# Patient Record
Sex: Female | Born: 1945
Health system: Southern US, Community
[De-identification: ages and names within clinical notes are randomized; demographics above are authoritative.]

## PROBLEM LIST (undated history)

## (undated) DIAGNOSIS — M545 Low back pain, unspecified: Secondary | ICD-10-CM

## (undated) DIAGNOSIS — J189 Pneumonia, unspecified organism: Secondary | ICD-10-CM

## (undated) DIAGNOSIS — M1990 Unspecified osteoarthritis, unspecified site: Secondary | ICD-10-CM

## (undated) DIAGNOSIS — I341 Nonrheumatic mitral (valve) prolapse: Secondary | ICD-10-CM

## (undated) DIAGNOSIS — K5790 Diverticulosis of intestine, part unspecified, without perforation or abscess without bleeding: Secondary | ICD-10-CM

## (undated) DIAGNOSIS — M810 Age-related osteoporosis without current pathological fracture: Secondary | ICD-10-CM

## (undated) DIAGNOSIS — C50911 Malignant neoplasm of unspecified site of right female breast: Secondary | ICD-10-CM

## (undated) DIAGNOSIS — R0600 Dyspnea, unspecified: Secondary | ICD-10-CM

## (undated) DIAGNOSIS — F329 Major depressive disorder, single episode, unspecified: Secondary | ICD-10-CM

## (undated) DIAGNOSIS — Z923 Personal history of irradiation: Secondary | ICD-10-CM

## (undated) DIAGNOSIS — I2699 Other pulmonary embolism without acute cor pulmonale: Secondary | ICD-10-CM

## (undated) DIAGNOSIS — I493 Ventricular premature depolarization: Secondary | ICD-10-CM

## (undated) DIAGNOSIS — R011 Cardiac murmur, unspecified: Secondary | ICD-10-CM

## (undated) DIAGNOSIS — R0609 Other forms of dyspnea: Secondary | ICD-10-CM

## (undated) DIAGNOSIS — K219 Gastro-esophageal reflux disease without esophagitis: Secondary | ICD-10-CM

## (undated) DIAGNOSIS — R002 Palpitations: Secondary | ICD-10-CM

## (undated) DIAGNOSIS — E785 Hyperlipidemia, unspecified: Secondary | ICD-10-CM

## (undated) DIAGNOSIS — R112 Nausea with vomiting, unspecified: Secondary | ICD-10-CM

## (undated) DIAGNOSIS — Z9889 Other specified postprocedural states: Secondary | ICD-10-CM

## (undated) DIAGNOSIS — K589 Irritable bowel syndrome without diarrhea: Secondary | ICD-10-CM

## (undated) DIAGNOSIS — E0500 Thyrotoxicosis with diffuse goiter without thyrotoxic crisis or storm: Secondary | ICD-10-CM

## (undated) DIAGNOSIS — N852 Hypertrophy of uterus: Secondary | ICD-10-CM

## (undated) DIAGNOSIS — Z87442 Personal history of urinary calculi: Secondary | ICD-10-CM

## (undated) DIAGNOSIS — G8929 Other chronic pain: Secondary | ICD-10-CM

## (undated) DIAGNOSIS — D689 Coagulation defect, unspecified: Secondary | ICD-10-CM

## (undated) DIAGNOSIS — T7840XA Allergy, unspecified, initial encounter: Secondary | ICD-10-CM

## (undated) DIAGNOSIS — M5126 Other intervertebral disc displacement, lumbar region: Secondary | ICD-10-CM

## (undated) DIAGNOSIS — D126 Benign neoplasm of colon, unspecified: Secondary | ICD-10-CM

## (undated) DIAGNOSIS — I10 Essential (primary) hypertension: Secondary | ICD-10-CM

## (undated) DIAGNOSIS — M8580 Other specified disorders of bone density and structure, unspecified site: Secondary | ICD-10-CM

## (undated) DIAGNOSIS — I499 Cardiac arrhythmia, unspecified: Secondary | ICD-10-CM

## (undated) HISTORY — PX: BREAST EXCISIONAL BIOPSY: SUR124

## (undated) HISTORY — DX: Allergy, unspecified, initial encounter: T78.40XA

## (undated) HISTORY — DX: Gastro-esophageal reflux disease without esophagitis: K21.9

## (undated) HISTORY — DX: Hypertrophy of uterus: N85.2

## (undated) HISTORY — DX: Unspecified osteoarthritis, unspecified site: M19.90

## (undated) HISTORY — PX: OTHER SURGICAL HISTORY: SHX169

## (undated) HISTORY — PX: JOINT REPLACEMENT: SHX530

## (undated) HISTORY — DX: Other specified disorders of bone density and structure, unspecified site: M85.80

## (undated) HISTORY — PX: POLYPECTOMY: SHX149

## (undated) HISTORY — DX: Age-related osteoporosis without current pathological fracture: M81.0

## (undated) HISTORY — DX: Personal history of irradiation: Z92.3

## (undated) HISTORY — DX: Coagulation defect, unspecified: D68.9

## (undated) HISTORY — DX: Irritable bowel syndrome, unspecified: K58.9

## (undated) HISTORY — PX: COLONOSCOPY: SHX174

## (undated) HISTORY — DX: Essential (primary) hypertension: I10

## (undated) HISTORY — DX: Benign neoplasm of colon, unspecified: D12.6

## (undated) HISTORY — DX: Other intervertebral disc displacement, lumbar region: M51.26

## (undated) HISTORY — PX: COLONOSCOPY W/ BIOPSIES AND POLYPECTOMY: SHX1376

## (undated) HISTORY — DX: Major depressive disorder, single episode, unspecified: F32.9

## (undated) HISTORY — DX: Hyperlipidemia, unspecified: E78.5

## (undated) HISTORY — DX: Diverticulosis of intestine, part unspecified, without perforation or abscess without bleeding: K57.90

---
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
## 1998-11-26 ENCOUNTER — Other Ambulatory Visit: Admission: RE | Admit: 1998-11-26 | Discharge: 1998-11-26 | Payer: Self-pay | Admitting: Obstetrics and Gynecology

## 1999-12-12 ENCOUNTER — Other Ambulatory Visit: Admission: RE | Admit: 1999-12-12 | Discharge: 1999-12-12 | Payer: Self-pay | Admitting: *Deleted

## 2000-01-03 ENCOUNTER — Encounter: Admission: RE | Admit: 2000-01-03 | Discharge: 2000-04-02 | Payer: Self-pay | Admitting: Anesthesiology

## 2000-01-14 ENCOUNTER — Encounter: Admission: RE | Admit: 2000-01-14 | Discharge: 2000-01-14 | Payer: Self-pay | Admitting: Family Medicine

## 2000-01-14 ENCOUNTER — Encounter: Payer: Self-pay | Admitting: Family Medicine

## 2000-12-18 ENCOUNTER — Other Ambulatory Visit: Admission: RE | Admit: 2000-12-18 | Discharge: 2000-12-18 | Payer: Self-pay | Admitting: *Deleted

## 2001-01-22 ENCOUNTER — Encounter: Admission: RE | Admit: 2001-01-22 | Discharge: 2001-01-22 | Payer: Self-pay | Admitting: Family Medicine

## 2001-01-22 ENCOUNTER — Encounter: Payer: Self-pay | Admitting: Family Medicine

## 2001-12-31 ENCOUNTER — Other Ambulatory Visit: Admission: RE | Admit: 2001-12-31 | Discharge: 2001-12-31 | Payer: Self-pay | Admitting: Obstetrics and Gynecology

## 2002-01-24 ENCOUNTER — Encounter: Admission: RE | Admit: 2002-01-24 | Discharge: 2002-01-24 | Payer: Self-pay | Admitting: Family Medicine

## 2002-01-24 ENCOUNTER — Encounter: Payer: Self-pay | Admitting: Family Medicine

## 2002-01-30 ENCOUNTER — Encounter: Payer: Self-pay | Admitting: Family Medicine

## 2002-01-30 ENCOUNTER — Encounter: Admission: RE | Admit: 2002-01-30 | Discharge: 2002-01-30 | Payer: Self-pay | Admitting: Family Medicine

## 2002-04-11 ENCOUNTER — Ambulatory Visit: Admission: RE | Admit: 2002-04-11 | Discharge: 2002-04-11 | Payer: Self-pay | Admitting: *Deleted

## 2003-01-07 ENCOUNTER — Other Ambulatory Visit: Admission: RE | Admit: 2003-01-07 | Discharge: 2003-01-07 | Payer: Self-pay | Admitting: Obstetrics and Gynecology

## 2003-02-04 ENCOUNTER — Encounter: Payer: Self-pay | Admitting: Family Medicine

## 2003-02-04 ENCOUNTER — Encounter: Admission: RE | Admit: 2003-02-04 | Discharge: 2003-02-04 | Payer: Self-pay | Admitting: Family Medicine

## 2004-01-19 ENCOUNTER — Other Ambulatory Visit: Admission: RE | Admit: 2004-01-19 | Discharge: 2004-01-19 | Payer: Self-pay | Admitting: Obstetrics and Gynecology

## 2004-02-09 ENCOUNTER — Encounter: Admission: RE | Admit: 2004-02-09 | Discharge: 2004-02-09 | Payer: Self-pay | Admitting: Family Medicine

## 2004-10-25 ENCOUNTER — Ambulatory Visit: Payer: Self-pay | Admitting: Cardiology

## 2004-11-03 ENCOUNTER — Ambulatory Visit: Payer: Self-pay

## 2004-11-21 HISTORY — PX: KNEE ARTHROSCOPY: SHX127

## 2005-02-09 ENCOUNTER — Encounter: Admission: RE | Admit: 2005-02-09 | Discharge: 2005-02-09 | Payer: Self-pay | Admitting: Family Medicine

## 2005-04-21 ENCOUNTER — Ambulatory Visit (HOSPITAL_COMMUNITY): Admission: RE | Admit: 2005-04-21 | Discharge: 2005-04-21 | Payer: Self-pay | Admitting: Orthopaedic Surgery

## 2005-04-21 ENCOUNTER — Ambulatory Visit (HOSPITAL_BASED_OUTPATIENT_CLINIC_OR_DEPARTMENT_OTHER): Admission: RE | Admit: 2005-04-21 | Discharge: 2005-04-21 | Payer: Self-pay | Admitting: Orthopaedic Surgery

## 2006-03-01 ENCOUNTER — Encounter: Admission: RE | Admit: 2006-03-01 | Discharge: 2006-03-01 | Payer: Self-pay | Admitting: Family Medicine

## 2006-10-03 ENCOUNTER — Ambulatory Visit: Payer: Self-pay | Admitting: Internal Medicine

## 2007-02-11 ENCOUNTER — Emergency Department (HOSPITAL_COMMUNITY): Admission: EM | Admit: 2007-02-11 | Discharge: 2007-02-11 | Payer: Self-pay | Admitting: Emergency Medicine

## 2007-02-15 ENCOUNTER — Ambulatory Visit: Payer: Self-pay | Admitting: Internal Medicine

## 2007-03-01 ENCOUNTER — Encounter (INDEPENDENT_AMBULATORY_CARE_PROVIDER_SITE_OTHER): Payer: Self-pay | Admitting: Specialist

## 2007-03-01 ENCOUNTER — Ambulatory Visit: Payer: Self-pay | Admitting: Internal Medicine

## 2007-03-08 ENCOUNTER — Encounter: Admission: RE | Admit: 2007-03-08 | Discharge: 2007-03-08 | Payer: Self-pay | Admitting: Family Medicine

## 2008-03-13 ENCOUNTER — Encounter: Admission: RE | Admit: 2008-03-13 | Discharge: 2008-03-13 | Payer: Self-pay | Admitting: Family Medicine

## 2008-09-15 ENCOUNTER — Encounter: Admission: RE | Admit: 2008-09-15 | Discharge: 2008-09-15 | Payer: Self-pay | Admitting: Orthopaedic Surgery

## 2009-03-26 ENCOUNTER — Encounter: Admission: RE | Admit: 2009-03-26 | Discharge: 2009-03-26 | Payer: Self-pay | Admitting: Family Medicine

## 2010-09-29 ENCOUNTER — Encounter (HOSPITAL_COMMUNITY)
Admission: RE | Admit: 2010-09-29 | Discharge: 2010-12-21 | Payer: Self-pay | Source: Home / Self Care | Attending: Internal Medicine | Admitting: Internal Medicine

## 2010-12-11 ENCOUNTER — Encounter: Payer: Self-pay | Admitting: Internal Medicine

## 2010-12-12 ENCOUNTER — Encounter: Payer: Self-pay | Admitting: Orthopaedic Surgery

## 2011-02-09 ENCOUNTER — Other Ambulatory Visit: Payer: Self-pay | Admitting: Family Medicine

## 2011-02-09 DIAGNOSIS — Z1231 Encounter for screening mammogram for malignant neoplasm of breast: Secondary | ICD-10-CM

## 2011-02-09 DIAGNOSIS — Z780 Asymptomatic menopausal state: Secondary | ICD-10-CM

## 2011-04-06 ENCOUNTER — Ambulatory Visit
Admission: RE | Admit: 2011-04-06 | Discharge: 2011-04-06 | Disposition: A | Discharge status: Home / Self Care | Payer: PRIVATE HEALTH INSURANCE | Source: Ambulatory Visit | Attending: Family Medicine | Admitting: Family Medicine

## 2011-04-06 DIAGNOSIS — Z780 Asymptomatic menopausal state: Secondary | ICD-10-CM

## 2011-04-06 DIAGNOSIS — Z1231 Encounter for screening mammogram for malignant neoplasm of breast: Secondary | ICD-10-CM

## 2011-04-08 NOTE — Assessment & Plan Note (Signed)
[facility]                           GASTROENTEROLOGY OFFICE NOTE   [name], [name]                     MRN:          [id number]  DATE:10/03/2006                            DOB:          03-May-1946    CHIEF COMPLAINT:  Epigastric pain and constipation.   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  She used to be on Zelnorm.  Since that has gone off of the market, she has had problems with lack or  urge to defecate and incomplete defecation again.  There is bloating and  gassiness, and says her epigastrium hurts quite a bit.  Whenever she eats,  she seems to have pain.  She feels better when she moves her bowels it  seems.  She recently returned from a mission trip to [place], and is  moving her bowels every day.  No diarrhea, and feels somewhat better.  There  has been on weight loss or bleeding.  She has regular physical exams by Dr.  Caryn Bee, and says her labs are fine.  Her maternal grandfather did have colon  cancer at a fairly young age.  There is no other family history of colon  cancer.  The patient had a normal colonoscopy with Dr. Bernette Redbird in  1998 at age of 43.  She tried Amitiza; that nauseated her quite a bit.  She  took Citracal and felt very bloated and almost obstructed, and feels like  she is worse when she takes fibrous foods as well.  GI review of systems is  otherwise negative.   PAST MEDICAL HISTORY:  1. Osteoarthritis with left knee problems, status post arthroscopy and      injection therapy.  2. Allergic rhinosinusitis.   FAMILY HISTORY:  Otherwise, positive for heart disease in her mother,  diabetes in her grandmother, alcoholism in her father.   SOCIAL HISTORY:  She is married, 1 son.  Masters degree, Engineer, civil (consulting).  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  One to  two glasses of alcohol a month.   No tobacco.  No drugs.   REVIEW OF SYMPTOMS:  Positive for joint pain.  All other systems negative.   PHYSICAL EXAMINATION:  Reveals a well-developed, well-nourished, white  woman, in no acute distress.  Height 5 feet 6 inches.  Weight 137 pounds.  Blood pressure 108/66.  Pulse 72.  Eyes anicteric.  Mouth free of lesions.  NECK:  Supple.  No thyromegaly or mass.  CHEST:  Clear.  HEART:  S1 and S2.  No murmur or gallops.  ABDOMEN:  Soft, non-tender.  No organomegaly or mass.  EXTREMITIES:  No peripheral edema.  SKIN:  Areas of skin inspected warm and dry without rash.  NEUROLOGIC/PSYCHIATRIC:  She is alert and oriented x3.  LYMPH NODES:  No supraclavicular nodes.   ASSESSMENT:  This sounds most like constipation, predominantly irritable  bowel syndrome with some functional dyspepsia.  She does not have any  worrisome features with weight loss or bleeding, etc.  The symptoms are  longstanding and she is improved with bowel movements.  There is a family  history of colon cancer in a 2nd degree relatively.  She had a colonoscopy 9  years ago which was okay.   PLAN:  1. Trial GlycoLax 1 dose a day.  She can use more than 1 dose a day if      needed.  This prescription is sent by [facility].  2. She should have a colonoscopy and possibly an upper endoscopy.  We      discussed the rationale, indications and benefits as well as risks.  3. The current plan is for her to try the GlycoLax.  She will come back to      see me in a month, as she would like to do a colonoscopy in January if      she is going to do one.  If her epigastric pain is all gone at that      time with good bowel movements, I think we have answer, and she only      needs the colonoscopy.  Should she have persistent epigastric pain,      then further lab workup and upper endoscopy with colonoscopy would be      needed it seems.     Iva Boop, MD,FACG  Electronically Signed    CEG/MedQ  DD: 10/03/2006  DT:  10/03/2006  Job #: [id number]   cc:   Caryn Bee L. Little, M.D.

## 2011-04-08 NOTE — Op Note (Signed)
[name], [name]              ACCOUNT NO.:  [id number]   MEDICAL RECORD NO.:  [id number]          PATIENT TYPE:  AMB   LOCATION:  [facility]                          FACILITY:  [facility]   PHYSICIAN:  Claude Manges. Whitfield, M.D.DATE OF BIRTH:  06-10-1946   DATE OF PROCEDURE:  04/21/2005  DATE OF DISCHARGE:                                 OPERATIVE REPORT   PREOPERATIVE DIAGNOSES:  1.  Tear of medial meniscus, left knee.  2.  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   POSTOPERATIVE DIAGNOSES:  1.  Tear of medial meniscus, left knee.  2.  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  3.  Chondromalacia patella.   OPERATION PERFORMED:  1.  Arthroscopic partial medial meniscectomy.  2.  Chondroplasty, medial femoral condyle, medial tibial plateau and patella      with microfracture, medial femoral condyle, medial tibial plateau.   SURGEON:  Claude Manges. Cleophas Dunker, M.D.   ANESTHESIA:  Local 1% Xylocaine with epinephrine and IV sedation.   COMPLICATIONS:  None.   HISTORY:  65 year old female has had progressive problems with her left knee  to the point of compromise.  She does have evidence of decrease in the  medial compartment of her left knee consistent with osteoarthritis but  because of her persistent pain, she has had an MRI scan that reveals a  medial meniscal tear involving the posterior horn extending to the inferior  surface with a large joint effusion and full thickness cartilage loss within  the medial compartment.  There was also a questionable AC deficiency.  Because of her persistent discomfort she preferred to proceed with  arthroscopic evaluation.   DESCRIPTION OF PROCEDURE:  With the patient comfortable on the operating  table and under intravenous sedation, the left lower extremity which was  determined to be the operative site preoperatively,  was placed in the thigh  holder.  The leg was then prepped with DuraPrep from the thigh holder to the  ankle.  Sterile draping was performed.   Diagnostic arthroscopy was performed using a medial and lateral parapatellar  tendon puncture site.  There was a minimal clear yellow effusion.  Diagnostic arthroscopy was initially performed through the lateral portal.  There was mild to moderate synovitis in the superior pouch.  This was  debrided with the ArthroCare wand.  There was also considerable fraying of  the patella with deep frond formation of the articular cartilage.  This was  also stabilized with the intra-articular shaver such that there was no  further frond formation and a very stable appearance.  There was as much  chondromalacia medially as there was laterally and there was no evidence of  patella tilt.   Both gutters revealed minimal synovitis and these were debrided with  ArthroCare wand.  The lateral compartment revealed an intact meniscus and  very little chondromalacia.  The anterior cruciate ligament appeared to be  thinned.  It was a little bit loose but there was only about a 2  or 3 mm  anterior drawer sign.  The fibers were intact but again loose.  There was  some synovitis in the intercondylar notch which was debrided with the  ArthroCare wand.   The medial compartment revealed considerable pathology.  There was a tear of  the medial meniscus at the junction of the medial and posterior thirds.  This was debrided with the intra-articular shaver and the ArthroCare wand.  The remaining meniscus was perfectly stable.  There was considerable  articular cartilage loss in the medial compartment with full thickness loss  in the area of the meniscal tear on the weightbearing surface of the medial  tibial plateau and a much larger area on the femoral condyle.  These areas  were debrided of any loosened articular cartilage and a microfracture  technique was performed.  The  joint was explored for any loose material.  Two stab wounds were left open and infiltrated with 0.25% Marcaine with  epinephrine.  A sterile bulky dressing was applied followed by an Ace  bandage.   PLAN:  Percocet for pain, crutches, office one week.       PWW/MEDQ  D:  04/21/2005  T:  04/21/2005  Job:  [id number]

## 2011-12-26 DIAGNOSIS — K5900 Constipation, unspecified: Secondary | ICD-10-CM | POA: Diagnosis not present

## 2011-12-26 DIAGNOSIS — Z79899 Other long term (current) drug therapy: Secondary | ICD-10-CM | POA: Diagnosis not present

## 2011-12-26 DIAGNOSIS — E0500 Thyrotoxicosis with diffuse goiter without thyrotoxic crisis or storm: Secondary | ICD-10-CM | POA: Diagnosis not present

## 2011-12-27 DIAGNOSIS — R799 Abnormal finding of blood chemistry, unspecified: Secondary | ICD-10-CM | POA: Diagnosis not present

## 2011-12-27 DIAGNOSIS — Z79899 Other long term (current) drug therapy: Secondary | ICD-10-CM | POA: Diagnosis not present

## 2012-01-06 ENCOUNTER — Encounter: Payer: Self-pay | Admitting: Internal Medicine

## 2012-01-06 ENCOUNTER — Ambulatory Visit (INDEPENDENT_AMBULATORY_CARE_PROVIDER_SITE_OTHER): Payer: Medicare Other | Admitting: Internal Medicine

## 2012-01-06 VITALS — BP 100/72 | HR 60 | Ht 64.5 in | Wt 135.0 lb

## 2012-01-06 DIAGNOSIS — Z1211 Encounter for screening for malignant neoplasm of colon: Secondary | ICD-10-CM

## 2012-01-06 DIAGNOSIS — K589 Irritable bowel syndrome without diarrhea: Secondary | ICD-10-CM

## 2012-01-06 DIAGNOSIS — Z860100 Personal history of colon polyps, unspecified: Secondary | ICD-10-CM

## 2012-01-06 MED ORDER — LINACLOTIDE 290 MCG PO CAPS: 1.0000 | ORAL_CAPSULE | Freq: Every day | ORAL | Status: DC

## 2012-01-06 MED ORDER — PEG-KCL-NACL-NASULF-NA ASC-C 100 G PO SOLR: 1.0000 | Freq: Once | ORAL | Status: DC

## 2012-01-06 NOTE — Patient Instructions (Addendum)
You have been scheduled for a Colonoscopy with separate instructions given. Your prep kit has been sent to your pharmacy for you to pick up. You have been started on Linzess 290 mg take 1 capsule daily before breakfast, please give [place] a cal  Back to let us know how this medication is helping.

## 2012-01-06 NOTE — Assessment & Plan Note (Signed)
It has been almost 5 years since last routine colonoscopy so a routine colonoscopy for screening and surveillance is reasonable at this time and she will schedule.The risks and benefits as well as alternatives of endoscopic procedure(s) have been discussed and reviewed. All questions answered. The patient agrees to proceed.

## 2012-01-06 NOTE — Assessment & Plan Note (Addendum)
She does not do well with fiber. There is a fair amount of bloating. And going to try linaclotide. Start with to 290 mcg daily before breakfast, more than 30 minutes. Side effect profile reviewed. If this is successful she can continue that. If not we'll consider Amitiza. Other options would be possible round of antibiotics for possible gas bloat and bacterial overgrowth situation which could exist.  Linzess was not effective, large watery stools after 3 days. She started warm prune juice every night and reports that is working well. 03/05/2012

## 2012-01-06 NOTE — Progress Notes (Signed)
Subjective:    Patient ID: [name], female    DOB: 04/03/1946, 66 y.o.   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 She has a long history of constipation predominant IBS. She took Zelnorm a number of years ago with success, but that's been off the market. She is currently trying MiraLax, occasional senna tea. She has area small and incomplete defecation, she lacks thirds to defecate at times will go for a few days without bowel movements and developed bloating and fairly intensive abdominal discomfort. It is not sharp or crampy but it  makes her feel very unwell. She has no difficulty with producing a bowel movement, with straining etc. that is not a problem. She's been this way for a number of years. She reports a fiber makes things worse with bloating.  He recently saw her primary care physician and tells me her thyroid function was normal.  I know her from these issues before, having been seen in 2007 and having had a colonoscopy in April 2008, where she had an 8 mm adenoma, and diverticulosis. She reports no problems with the sedation at that time 100 mcg fentanyl and 10 mg Versed.  GI review of systems is negative otherwise. There has been no rectal bleeding or melena. She has no upper GI complaints.  Allergies  Allergen Reactions  . Amoxicillin   . Terak (Terramycin)    Medications listed and reviewed in the electronic medical record    Past Medical History  Diagnosis Date  . Diverticulosis   . Adenomatous colon polyp   . Arthritis   . Allergic rhinitis   . HLD (hyperlipidemia)   . IBS (irritable bowel syndrome)    Past Surgical History  Procedure Date  . Colonoscopy w/ biopsies and polypectomy 03/01/2007    diverticulosis, adenomatous polyp  . Breast biopsy   . Knee arthroscopy     Left Knee   History   Social History  . Marital Status: Married                 Occupational History  . RN  [employer]    Social History Main Topics  . Smoking status: Never Smoker   . Smokeless tobacco: Never Used  . Alcohol Use: Yes  . Drug Use: No  .       Family History  Problem Relation Age of Onset  . Colon cancer Maternal Grandfather   . Heart disease Mother     MI  . Diabetes      grandmother  . Alcohol abuse Father   . Lung cancer Mother   . Hypertension Mother   . Breast cancer      aunts  . COPD Father        Review of Systems Positive for bilateral knee pain. She reports no other problems with her comprehensive review of systems, other than those things mentioned in the history of present illness.    Objective:   Physical Exam General:  Well-developed, well-nourished and in no acute distress Eyes:  anicteric. ENT:   Mouth and posterior pharynx free of lesions.  Neck:   supple w/o thyromegaly or mass.  Lungs: Clear to auscultation bilaterally. Heart:  S1S2, no rubs, murmurs, gallops. Abdomen:  soft, non-tender, no hepatosplenomegaly, hernia, or mass and BS+.  Rectal: Female staff present - inspection anoderm in the left lateral decubitus position shows that it  is normal. There is a prominent anal wink. Digital exam shows normal resting tone. There is formed brown stool, small amount. There is no mass or significant rectocele. Result    voluntarily squeeze and normal simulated defecation with Valsalva including appropriate abdominal contraction and descent. Lymph:  no cervical or supraclavicular adenopathy. Extremities:   no edema Neuro:  A&O x 3.  Psych:  appropriate mood and  Affect.       Assessment & Plan:

## 2012-02-27 ENCOUNTER — Other Ambulatory Visit: Payer: Self-pay | Admitting: Family Medicine

## 2012-02-27 DIAGNOSIS — Z1231 Encounter for screening mammogram for malignant neoplasm of breast: Secondary | ICD-10-CM

## 2012-03-05 ENCOUNTER — Ambulatory Visit (AMBULATORY_SURGERY_CENTER): Payer: Medicare Other | Admitting: Internal Medicine

## 2012-03-05 ENCOUNTER — Encounter: Payer: Self-pay | Admitting: Internal Medicine

## 2012-03-05 VITALS — BP 137/92 | HR 77 | Temp 98.4°F | Resp 16 | Ht 64.5 in | Wt 135.0 lb

## 2012-03-05 DIAGNOSIS — Z1211 Encounter for screening for malignant neoplasm of colon: Secondary | ICD-10-CM

## 2012-03-05 DIAGNOSIS — Z860100 Personal history of colon polyps, unspecified: Secondary | ICD-10-CM

## 2012-03-05 DIAGNOSIS — D126 Benign neoplasm of colon, unspecified: Secondary | ICD-10-CM | POA: Diagnosis not present

## 2012-03-05 DIAGNOSIS — K5730 Diverticulosis of large intestine without perforation or abscess without bleeding: Secondary | ICD-10-CM

## 2012-03-05 MED ORDER — SODIUM CHLORIDE 0.9 % IV SOLN: 500.0000 mL | INTRAVENOUS | Status: DC

## 2012-03-05 NOTE — Op Note (Signed)
[facility] [address] [place], [place]  [address]  COLONOSCOPY PROCEDURE REPORT  PATIENT:  [name], [name]  MR#:  [id number] BIRTHDATE:  [birth year], 66 yrs. old  GENDER:  female ENDOSCOPIST:  Iva Boop, MD, [facility]  PROCEDURE DATE:  03/05/2012 PROCEDURE:  Colonoscopy with snare polypectomy ASA CLASS:  Class II INDICATIONS:  surveillance and high-risk screening, history of pre-cancerous (adenomatous) colon polyps 8 mm adenoma removed at index 2008 MEDICATIONS:   These medications were titrated to patient response per physician's verbal order, Fentanyl 75 mcg IV, Versed 7 mg IV  DESCRIPTION OF PROCEDURE:   After the risks benefits and alternatives of the procedure were thoroughly explained, informed consent was obtained.  Digital rectal exam was performed and revealed no abnormalities.   The LB CF-[id number] endoscope was introduced through the anus and advanced to the cecum, which was identified by both the appendix and ileocecal valve, without limitations.  The quality of the prep was excellent, using MoviPrep.  The instrument was then slowly withdrawn as the colon was fully examined. <<PROCEDUREIMAGES>>  FINDINGS:  A diminutive polyp was found in the ascending colon. It was 5 mm in size. Polyp was snared without cautery. Retrieval was successful. snare polyp  Moderate diverticulosis was found throughout the colon.  This was otherwise a normal examination of the colon.   Retroflexed views in the rectum revealed no abnormalities.    The time to cecum = 3:38 minutes. The scope was then withdrawn in 12:03 minutes from the cecum and the procedure completed. COMPLICATIONS:  None ENDOSCOPIC IMPRESSION: 1) 5 mm diminutive polyp in the ascending colon - removed 2) Moderate diverticulosis throughout the colon 3) Otherwise normal examination - excellent prep 4) Personal history of adenoma rmoval 2008  REPEAT EXAM:  In for Colonoscopy, pending biopsy  results.  Iva Boop, MD, [facility]  CC:  The Patient and Catha Gosselin, MD  n. Rosalie Doctor:   Iva Boop at 03/05/2012 12:13 PM  [place], [phone]

## 2012-03-05 NOTE — Progress Notes (Signed)
1155 PATENT AWOKE AND STATED"AM I SUPPOSE TO BE AWAKE, I KNOW EVERYTHING THAT IS HAPPENING."ADDITIONAL VERSED PER MD ORDER, PATIENT SLEEPING AT 1157

## 2012-03-05 NOTE — Progress Notes (Signed)
Patient did not experience any of the following events: a burn prior to discharge; a fall within the facility; wrong site/side/patient/procedure/implant event; or a hospital transfer or hospital admission upon discharge from the facility. (G8907) Patient did not have preoperative order for IV antibiotic SSI prophylaxis. (G8918)  

## 2012-03-05 NOTE — Patient Instructions (Addendum)
1 polyp removed and sent to pathology,  Moderate diverticulosis.  Otherwise normal exam.  Await pathology results.  YOU HAD AN ENDOSCOPIC PROCEDURE TODAY AT THE [facility]: Refer to the procedure report that was given to you for any specific questions about what was found during the examination.  If the procedure report does not answer your questions, please call your gastroenterologist to clarify.  If you requested that your care partner not be given the details of your procedure findings, then the procedure report has been included in a sealed envelope for you to review at your convenience later.  YOU SHOULD EXPECT: Some feelings of bloating in the abdomen. Passage of more gas than usual.  Walking can help get rid of the air that was put into your GI tract during the procedure and reduce the bloating. If you had a lower endoscopy (such as a colonoscopy or flexible sigmoidoscopy) you may notice spotting of blood in your stool or on the toilet paper. If you underwent a bowel prep for your procedure, then you may not have a normal bowel movement for a few days.  DIET: Your first meal following the procedure should be a light meal and then it is ok to progress to your normal diet.  A half-sandwich or bowl of soup is an example of a good first meal.  Heavy or fried foods are harder to digest and may make you feel nauseous or bloated.  Likewise meals heavy in dairy and vegetables can cause extra gas to form and this can also increase the bloating.  Drink plenty of fluids but you should avoid alcoholic beverages for 24 hours.  ACTIVITY: Your care partner should take you home directly after the procedure.  You should plan to take it easy, moving slowly for the rest of the day.  You can resume normal activity the day after the procedure however you should NOT DRIVE or use heavy machinery for 24 hours (because of the sedation medicines used during the test).    SYMPTOMS TO REPORT IMMEDIATELY: A  gastroenterologist can be reached at any hour.  During normal business hours, 8:30 AM to 5:00 PM Monday through Friday, call [phone].  After hours and on weekends, please call the GI answering service at [phone] who will take a message and have the physician on call contact you.   Following lower endoscopy (colonoscopy or flexible sigmoidoscopy):  Excessive amounts of blood in the stool  Significant tenderness or worsening of abdominal pains  Swelling of the abdomen that is new, acute  Fever of 100F or higher  FOLLOW UP: If any biopsies were taken you will be contacted by phone or by letter within the next 1-3 weeks.  Call your gastroenterologist if you have not heard about the biopsies in 3 weeks.  Our staff will call the home number listed on your records the next business day following your procedure to check on you and address any questions or concerns that you may have at that time regarding the information given to you following your procedure. This is a courtesy call and so if there is no answer at the home number and we have not heard from you through the emergency physician on call, we will assume that you have returned to your regular daily activities without incident.  SIGNATURES/CONFIDENTIALITY: You and/or your care partner have signed paperwork which will be entered into your electronic medical record.  These signatures attest to the fact that that the information above  on your After Visit Summary has been reviewed and is understood.  Full responsibility of the confidentiality of this discharge information lies with you and/or your care-partner.

## 2012-03-06 ENCOUNTER — Telehealth: Payer: Self-pay

## 2012-03-06 NOTE — Telephone Encounter (Signed)
  Follow up Call-  Call back number 03/05/2012  Post procedure Call Back phone  # [phone]  Permission to leave phone message Yes     Patient questions:  Do you have a fever, pain , or abdominal swelling? no Pain Score  0 *  Have you tolerated food without any problems? yes  Have you been able to return to your normal activities? yes  Do you have any questions about your discharge instructions: Diet   no Medications  no Follow up visit  no  Do you have questions or concerns about your Care? no  Actions: * If pain score is 4 or above: No action needed, pain <4.

## 2012-03-07 DIAGNOSIS — D126 Benign neoplasm of colon, unspecified: Secondary | ICD-10-CM

## 2012-03-07 HISTORY — DX: Benign neoplasm of colon, unspecified: D12.6

## 2012-03-09 ENCOUNTER — Encounter: Payer: Self-pay | Admitting: Internal Medicine

## 2012-03-09 NOTE — Progress Notes (Signed)
Quick Note:  Adenoma Repeat colonoscopy about 02/2017 ______

## 2012-03-14 DIAGNOSIS — Z124 Encounter for screening for malignant neoplasm of cervix: Secondary | ICD-10-CM | POA: Diagnosis not present

## 2012-03-14 DIAGNOSIS — Z1151 Encounter for screening for human papillomavirus (HPV): Secondary | ICD-10-CM | POA: Diagnosis not present

## 2012-03-14 DIAGNOSIS — N951 Menopausal and female climacteric states: Secondary | ICD-10-CM | POA: Diagnosis not present

## 2012-03-14 DIAGNOSIS — Z01419 Encounter for gynecological examination (general) (routine) without abnormal findings: Secondary | ICD-10-CM | POA: Diagnosis not present

## 2012-03-15 DIAGNOSIS — Z01419 Encounter for gynecological examination (general) (routine) without abnormal findings: Secondary | ICD-10-CM | POA: Diagnosis not present

## 2012-04-11 ENCOUNTER — Ambulatory Visit
Admission: RE | Admit: 2012-04-11 | Discharge: 2012-04-11 | Disposition: A | Discharge status: Home / Self Care | Payer: Medicare Other | Source: Ambulatory Visit | Attending: Family Medicine | Admitting: Family Medicine

## 2012-04-11 DIAGNOSIS — Z1231 Encounter for screening mammogram for malignant neoplasm of breast: Secondary | ICD-10-CM | POA: Diagnosis not present

## 2012-04-23 DIAGNOSIS — M1710 Unilateral primary osteoarthritis, unspecified knee: Secondary | ICD-10-CM | POA: Diagnosis not present

## 2012-08-25 DIAGNOSIS — Z23 Encounter for immunization: Secondary | ICD-10-CM | POA: Diagnosis not present

## 2012-09-18 DIAGNOSIS — E7800 Pure hypercholesterolemia, unspecified: Secondary | ICD-10-CM | POA: Diagnosis not present

## 2012-09-18 DIAGNOSIS — Z1331 Encounter for screening for depression: Secondary | ICD-10-CM | POA: Diagnosis not present

## 2012-09-18 DIAGNOSIS — F329 Major depressive disorder, single episode, unspecified: Secondary | ICD-10-CM | POA: Diagnosis not present

## 2012-09-18 DIAGNOSIS — R7989 Other specified abnormal findings of blood chemistry: Secondary | ICD-10-CM | POA: Diagnosis not present

## 2012-09-18 DIAGNOSIS — Z23 Encounter for immunization: Secondary | ICD-10-CM | POA: Diagnosis not present

## 2012-09-18 DIAGNOSIS — E039 Hypothyroidism, unspecified: Secondary | ICD-10-CM | POA: Diagnosis not present

## 2012-11-09 DIAGNOSIS — E0590 Thyrotoxicosis, unspecified without thyrotoxic crisis or storm: Secondary | ICD-10-CM | POA: Diagnosis not present

## 2012-11-21 HISTORY — PX: BREAST LUMPECTOMY: SHX2

## 2012-11-21 HISTORY — PX: BREAST BIOPSY: SHX20

## 2013-01-09 DIAGNOSIS — H04129 Dry eye syndrome of unspecified lacrimal gland: Secondary | ICD-10-CM | POA: Diagnosis not present

## 2013-03-11 ENCOUNTER — Other Ambulatory Visit: Payer: Self-pay

## 2013-03-11 DIAGNOSIS — Z1231 Encounter for screening mammogram for malignant neoplasm of breast: Secondary | ICD-10-CM

## 2013-03-11 DIAGNOSIS — E0590 Thyrotoxicosis, unspecified without thyrotoxic crisis or storm: Secondary | ICD-10-CM | POA: Diagnosis not present

## 2013-04-23 ENCOUNTER — Ambulatory Visit
Admission: RE | Admit: 2013-04-23 | Discharge: 2013-04-23 | Disposition: A | Discharge status: Home / Self Care | Payer: Medicare Other | Source: Ambulatory Visit

## 2013-04-23 DIAGNOSIS — Z1231 Encounter for screening mammogram for malignant neoplasm of breast: Secondary | ICD-10-CM | POA: Diagnosis not present

## 2013-04-24 ENCOUNTER — Other Ambulatory Visit: Payer: Self-pay | Admitting: Family Medicine

## 2013-04-24 DIAGNOSIS — R928 Other abnormal and inconclusive findings on diagnostic imaging of breast: Secondary | ICD-10-CM

## 2013-05-10 ENCOUNTER — Other Ambulatory Visit: Payer: Self-pay | Admitting: Family Medicine

## 2013-05-10 ENCOUNTER — Ambulatory Visit
Admission: RE | Admit: 2013-05-10 | Discharge: 2013-05-10 | Disposition: A | Discharge status: Home / Self Care | Payer: Medicare Other | Source: Ambulatory Visit | Attending: Family Medicine | Admitting: Family Medicine

## 2013-05-10 DIAGNOSIS — R928 Other abnormal and inconclusive findings on diagnostic imaging of breast: Secondary | ICD-10-CM

## 2013-05-14 DIAGNOSIS — L570 Actinic keratosis: Secondary | ICD-10-CM | POA: Diagnosis not present

## 2013-05-14 DIAGNOSIS — L821 Other seborrheic keratosis: Secondary | ICD-10-CM | POA: Diagnosis not present

## 2013-05-14 DIAGNOSIS — D485 Neoplasm of uncertain behavior of skin: Secondary | ICD-10-CM | POA: Diagnosis not present

## 2013-05-20 ENCOUNTER — Other Ambulatory Visit: Payer: Self-pay | Admitting: Family Medicine

## 2013-05-20 ENCOUNTER — Ambulatory Visit
Admission: RE | Admit: 2013-05-20 | Discharge: 2013-05-20 | Disposition: A | Discharge status: Home / Self Care | Payer: Medicare Other | Source: Ambulatory Visit | Attending: Family Medicine | Admitting: Family Medicine

## 2013-05-20 DIAGNOSIS — R928 Other abnormal and inconclusive findings on diagnostic imaging of breast: Secondary | ICD-10-CM

## 2013-05-20 DIAGNOSIS — D0590 Unspecified type of carcinoma in situ of unspecified breast: Secondary | ICD-10-CM | POA: Diagnosis not present

## 2013-05-20 DIAGNOSIS — R921 Mammographic calcification found on diagnostic imaging of breast: Secondary | ICD-10-CM

## 2013-05-20 DIAGNOSIS — C50919 Malignant neoplasm of unspecified site of unspecified female breast: Secondary | ICD-10-CM | POA: Diagnosis not present

## 2013-05-20 DIAGNOSIS — C50911 Malignant neoplasm of unspecified site of right female breast: Secondary | ICD-10-CM

## 2013-05-20 HISTORY — DX: Malignant neoplasm of unspecified site of right female breast: C50.911

## 2013-05-22 ENCOUNTER — Telehealth: Payer: Self-pay | Admitting: *Deleted

## 2013-05-22 ENCOUNTER — Other Ambulatory Visit: Payer: Self-pay | Admitting: Family Medicine

## 2013-05-22 DIAGNOSIS — C50911 Malignant neoplasm of unspecified site of right female breast: Secondary | ICD-10-CM

## 2013-05-22 NOTE — Telephone Encounter (Signed)
Left message for a return phone call to schedule [facility] appt.

## 2013-05-23 ENCOUNTER — Telehealth: Payer: Self-pay | Admitting: *Deleted

## 2013-05-23 DIAGNOSIS — C50411 Malignant neoplasm of upper-outer quadrant of right female breast: Secondary | ICD-10-CM | POA: Insufficient documentation

## 2013-05-23 NOTE — Telephone Encounter (Signed)
Called and spoke with patient and confirmed her [facility] appt. For 05/29/13 at 1200N.  Instructions and contact information given.  No further questions or concerns at this time.

## 2013-05-29 ENCOUNTER — Ambulatory Visit: Payer: Medicare Other

## 2013-05-29 ENCOUNTER — Ambulatory Visit: Payer: Medicare Other | Attending: Surgery | Admitting: Physical Therapy

## 2013-05-29 ENCOUNTER — Other Ambulatory Visit (HOSPITAL_BASED_OUTPATIENT_CLINIC_OR_DEPARTMENT_OTHER): Payer: Medicare Other

## 2013-05-29 ENCOUNTER — Ambulatory Visit (HOSPITAL_BASED_OUTPATIENT_CLINIC_OR_DEPARTMENT_OTHER): Payer: Medicare Other | Admitting: Oncology

## 2013-05-29 ENCOUNTER — Encounter (INDEPENDENT_AMBULATORY_CARE_PROVIDER_SITE_OTHER): Payer: Self-pay | Admitting: Surgery

## 2013-05-29 ENCOUNTER — Ambulatory Visit
Admission: RE | Admit: 2013-05-29 | Discharge: 2013-05-29 | Disposition: A | Discharge status: Home / Self Care | Payer: Medicare Other | Source: Ambulatory Visit | Attending: Radiation Oncology | Admitting: Radiation Oncology

## 2013-05-29 ENCOUNTER — Encounter: Payer: Self-pay | Admitting: *Deleted

## 2013-05-29 ENCOUNTER — Encounter: Payer: Self-pay | Admitting: Oncology

## 2013-05-29 ENCOUNTER — Ambulatory Visit (HOSPITAL_BASED_OUTPATIENT_CLINIC_OR_DEPARTMENT_OTHER): Payer: Medicare Other | Admitting: Surgery

## 2013-05-29 VITALS — BP 144/89 | HR 63 | Temp 98.1°F | Resp 20 | Ht 64.5 in | Wt 137.5 lb

## 2013-05-29 DIAGNOSIS — C50411 Malignant neoplasm of upper-outer quadrant of right female breast: Secondary | ICD-10-CM

## 2013-05-29 DIAGNOSIS — C50919 Malignant neoplasm of unspecified site of unspecified female breast: Secondary | ICD-10-CM

## 2013-05-29 DIAGNOSIS — C50419 Malignant neoplasm of upper-outer quadrant of unspecified female breast: Secondary | ICD-10-CM | POA: Diagnosis not present

## 2013-05-29 DIAGNOSIS — C50911 Malignant neoplasm of unspecified site of right female breast: Secondary | ICD-10-CM

## 2013-05-29 DIAGNOSIS — IMO0001 Reserved for inherently not codable concepts without codable children: Secondary | ICD-10-CM | POA: Insufficient documentation

## 2013-05-29 DIAGNOSIS — Z01818 Encounter for other preprocedural examination: Secondary | ICD-10-CM | POA: Diagnosis not present

## 2013-05-29 DIAGNOSIS — M25569 Pain in unspecified knee: Secondary | ICD-10-CM | POA: Insufficient documentation

## 2013-05-29 LAB — COMPREHENSIVE METABOLIC PANEL (CC13)
ALT: 15 U/L (ref 0–55)
AST: 18 U/L (ref 5–34)
Alkaline Phosphatase: 71 U/L (ref 40–150)
Creatinine: 0.8 mg/dL (ref 0.6–1.1)
Total Bilirubin: 0.67 mg/dL (ref 0.20–1.20)

## 2013-05-29 LAB — CBC WITH DIFFERENTIAL/PLATELET
BASO%: 0.1 % (ref 0.0–2.0)
EOS%: 2.1 % (ref 0.0–7.0)
HCT: 41.4 % (ref 34.8–46.6)
LYMPH%: 15.5 % (ref 14.0–49.7)
MCH: 33.4 pg (ref 25.1–34.0)
MCHC: 34.1 g/dL (ref 31.5–36.0)
NEUT%: 74.9 % (ref 38.4–76.8)
Platelets: 212 10*3/uL (ref 145–400)
lymph#: 1 10*3/uL (ref 0.9–3.3)

## 2013-05-29 NOTE — Progress Notes (Signed)
Patient ID: [name], female   DOB: [birth year], 67 y.o.   MRN: [id number]  Chief Complaint  Patient presents with  . Other    right breast cancer    HPI [name] is a 67 y.o. female.   HPI This is a pleasant female who is referred by Dr. Catha Gosselin for the recent diagnosis of a right breast cancer. She had abnormal calcifications detected in the right breast on recent screening mammography. Biopsy was performed confirming malignancy. She's had a previous biopsy for benign disease approximately 20 years ago. She has had no other problems regarding her breast. She denies nipple discharge. She is otherwise without complaints. Past Medical History  Diagnosis Date  . Diverticulosis   . Adenomatous colon polyp   . Arthritis   . Allergic rhinitis   . HLD (hyperlipidemia)   . IBS (irritable bowel syndrome)   . Breast cancer     Past Surgical History  Procedure Laterality Date  . Colonoscopy w/ biopsies and polypectomy  multiple  . Breast biopsy    . Knee arthroscopy      Left Knee    Family History  Problem Relation Age of Onset  . Colon cancer Maternal Grandfather   . Heart disease Mother     MI  . Lung cancer Mother   . Hypertension Mother   . Diabetes      grandmother  . Alcohol abuse Father   . COPD Father   . Breast cancer      aunts  . Breast cancer Maternal Aunt   . Pancreatic cancer Maternal Aunt     Social History History  Substance Use Topics  . Smoking status: Never Smoker   . Smokeless tobacco: Never Used  . Alcohol Use: Yes    Allergies  Allergen Reactions  . Amoxicillin     diarrhea  . Terak (Terramycin)     rash    Current Outpatient Prescriptions  Medication Sig Dispense Refill  . aspirin 81 MG tablet Take 81 mg by mouth daily.      Marland Kitchen atorvastatin (LIPITOR) 10 MG tablet Take 150 mg by mouth daily.       Marland Kitchen buPROPion (WELLBUTRIN SR) 150 MG 12 hr tablet Take 150 mg by mouth 1 day or 1 dose.      . celecoxib (CELEBREX) 200 MG  capsule Take 200 mg by mouth daily.      . Echinacea 125 MG CAPS Take by mouth as needed.      Marland Kitchen glucosamine-chondroitin 500-400 MG tablet Take 1 tablet by mouth. 2 by mouth once daily      . ibuprofen (ADVIL) 200 MG tablet Take 200 mg by mouth every 6 (six) hours as needed.       Marland Kitchen levothyroxine (SYNTHROID, LEVOTHROID) 88 MCG tablet Take 88 mcg by mouth daily.      . magnesium 30 MG tablet Take 5 mg by mouth at bedtime.      . Nutritional Supplements (MELATONIN PO) Take 6 mg by mouth at bedtime.      . polyethylene glycol (MIRALAX / GLYCOLAX) packet Take 17 g by mouth daily. As needed      . Zolpidem Tartrate (AMBIEN PO) Take by mouth as needed.       No current facility-administered medications for this visit.    Review of Systems Review of Systems  Constitutional: Negative for fever, chills and unexpected weight change.  HENT: Negative for hearing loss, congestion, sore throat, trouble swallowing  and voice change.   Eyes: Negative for visual disturbance.  Respiratory: Negative for cough and wheezing.   Cardiovascular: Negative for chest pain, palpitations and leg swelling.  Gastrointestinal: Negative for nausea, vomiting, abdominal pain, diarrhea, constipation, blood in stool, abdominal distention and anal bleeding.  Genitourinary: Negative for hematuria, vaginal bleeding and difficulty urinating.  Musculoskeletal: Negative for arthralgias.  Skin: Negative for rash and wound.  Neurological: Negative for seizures, syncope and headaches.  Hematological: Negative for adenopathy. Bruises/bleeds easily.  Psychiatric/Behavioral: Negative for confusion.    There were no vitals taken for this visit.  Physical Exam Physical Exam  Constitutional: She is oriented to person, place, and time. She appears well-developed and well-nourished. No distress.  HENT:  Head: Normocephalic and atraumatic.  Right Ear: External ear normal.  Left Ear: External ear normal.  Nose: Nose normal.   Mouth/Throat: Oropharynx is clear and moist. No oropharyngeal exudate.  Eyes: Conjunctivae are normal. Pupils are equal, round, and reactive to light. Right eye exhibits no discharge. Left eye exhibits no discharge. No scleral icterus.  Neck: Normal range of motion. Neck supple. No tracheal deviation present. No thyromegaly present.  Cardiovascular: Normal rate, regular rhythm, normal heart sounds and intact distal pulses.   No murmur heard. Pulmonary/Chest: Effort normal and breath sounds normal. No respiratory distress. She has no wheezes.  Musculoskeletal: Normal range of motion. She exhibits no edema and no tenderness.  Lymphadenopathy:    She has no cervical adenopathy.  Neurological: She is alert and oriented to person, place, and time.  Skin: Skin is warm and dry. No rash noted. No erythema.  Psychiatric: Her behavior is normal. Judgment normal.  Breasts: There are no palpable masses in the right breast. She has ecchymosis and a mild hematoma from the biopsy. There is no axillary or supraclavicular adenopathy on the right  Data Reviewed I have reviewed her mammograms and pathology report. This demonstrates a 1.7 lobular in situ and small focus of invasive cancer. It is ER positive and PR negative  Assessment    Invasive right breast cancer     Plan    After discussion in the cancer conference, a right breast lumpectomy under needle localization with a right sentinel lymph node biopsy is recommended. This will tentatively be scheduled. She'll be having an MRI on July 11 and we will change her plans accordingly if there are other abnormalities. I discussed the surgery with her in detail including the for similar biopsy. I discussed the risks of the surgery with her as well. Surgery will be scheduled.        Lealon Vanputten A 05/29/2013, 1:27 PM

## 2013-05-29 NOTE — Progress Notes (Signed)
[facility] Radiation Oncology NEW PATIENT EVALUATION  Name: [name] MRN: [id number]  Date:   05/29/2013           DOB: [birth year]  Status: outpatient   CC: Michelle Hillier, MD  Michelle Rubenstein, MD    REFERRING PHYSICIAN: Shelly Rubenstein, MD   DIAGNOSIS: Stage I (T1, N0, M0) invasive lobular/LCIS of the right breast   HISTORY OF PRESENT ILLNESS:  [name] is a 67 y.o. female who is seen today at the [facility] for evaluation of her T1 N0 invasive lobular carcinoma/LCIS of the right breast. At the time of a [facility] on [facility]23/2013 she was found to have suspicious calcifications within the right breast. Additional views on 05/10/2013 showed suspicious calcifications within the upper-outer quadrant. She underwent a stereotactic core biopsy on 05/20/2013 which was diagnostic for invasive lobular carcinoma along with LCIS. There was also a portion of one benign lymph node seen. The invasive lobular carcinoma was ER positive and PR negative with a Ki-67 of 10%. She is scheduled for a MRI scan on July 11. She seen today with Dr. Ezzard Standing and Dr. Welton Flakes.  PREVIOUS RADIATION THERAPY: No   PAST MEDICAL HISTORY:  has a past medical history of Diverticulosis; Adenomatous colon polyp; Arthritis; Allergic rhinitis; HLD (hyperlipidemia); IBS (irritable bowel syndrome); and Breast cancer.     PAST SURGICAL HISTORY:  Past Surgical History  Procedure Laterality Date  . Colonoscopy w/ biopsies and polypectomy  multiple  . Breast biopsy    . Knee arthroscopy      Left Knee     FAMILY HISTORY: family history includes Alcohol abuse in her father; Breast cancer in her maternal aunt and unspecified family member; COPD in her father; Colon cancer in her maternal grandfather; Diabetes in an unspecified family member; Heart disease in her mother; Hypertension in her mother; Lung cancer in her mother; and Pancreatic cancer in her maternal aunt. Her  father died of COPD at age 85. Her mother had lung cancer but died following a heart attack at 52. A maternal aunt was diagnosed with breast cancer at 43.   SOCIAL HISTORY:  reports that she has never smoked. She has never used smokeless tobacco. She reports that  drinks alcohol. She reports that she does not use illicit drugs. Married, 53 year old son. She works as and Actuary.   ALLERGIES: Amoxicillin and Terak   MEDICATIONS:  Current Outpatient Prescriptions  Medication Sig Dispense Refill  . aspirin 81 MG tablet Take 81 mg by mouth daily.      Marland Kitchen atorvastatin (LIPITOR) 10 MG tablet Take 150 mg by mouth daily.       Marland Kitchen buPROPion (WELLBUTRIN SR) 150 MG 12 hr tablet Take 150 mg by mouth 1 day or 1 dose.      . celecoxib (CELEBREX) 200 MG capsule Take 200 mg by mouth daily.      . Echinacea 125 MG CAPS Take by mouth as needed.      Marland Kitchen glucosamine-chondroitin 500-400 MG tablet Take 1 tablet by mouth. 2 by mouth once daily      . ibuprofen (ADVIL) 200 MG tablet Take 200 mg by mouth every 6 (six) hours as needed.       Marland Kitchen levothyroxine (SYNTHROID, LEVOTHROID) 88 MCG tablet Take 88 mcg by mouth daily.      . magnesium 30 MG tablet Take 5 mg by mouth at bedtime.      Marland Kitchen  Nutritional Supplements (MELATONIN PO) Take 6 mg by mouth at bedtime.      . polyethylene glycol (MIRALAX / GLYCOLAX) packet Take 17 g by mouth daily. As needed      . Zolpidem Tartrate (AMBIEN PO) Take by mouth as needed.       No current facility-administered medications for this encounter.     REVIEW OF SYSTEMS:  Pertinent items are noted in HPI.    PHYSICAL EXAM: Alert and oriented 67 year old white female appearing her stated age. Wt Readings from Last 3 Encounters:  05/29/13 137 lb 8 oz (62.37 kg)  03/05/12 135 lb (61.236 kg)  01/06/12 135 lb (61.236 kg)   Temp Readings from Last 3 Encounters:  05/29/13 98.1 F (36.7 C) Oral  03/05/12 98.4 F (36.9 C)    BP Readings from Last 3 Encounters:   05/29/13 144/89  03/05/12 137/92  01/06/12 100/72   Pulse Readings from Last 3 Encounters:  05/29/13 63  03/05/12 77  01/06/12 60   Head and neck examination: Grossly unremarkable. Nodes: Without palpable cervical, supraclavicular, or axillary lymphadenopathy. Chest: Lungs clear. Heart: Regular rate and rhythm. Breasts: There is a biopsy wound at approximately 10:00 along the upper outer quadrant of the left breast with surrounding ecchymosis. No masses are appreciated. Left breast without masses or lesions. Abdomen without hepatomegaly. Extremities: Without edema.   LABORATORY DATA:  Lab Results  Component Value Date   WBC 6.2 05/29/2013   HGB 14.1 05/29/2013   HCT 41.4 05/29/2013   MCV 97.9 05/29/2013   PLT 212 05/29/2013   Lab Results  Component Value Date   NA 140 05/29/2013   K 4.7 05/29/2013   CO2 27 05/29/2013   Lab Results  Component Value Date   ALT 15 05/29/2013   AST 18 05/29/2013   ALKPHOS 71 05/29/2013   BILITOT 0.67 05/29/2013      IMPRESSION: Stage I (T1, N0, M0) invasive lobular carcinoma/LCIS of the right breast. I explained to the patient that her local treatment options include partial mastectomy followed by radiation therapy or mastectomy. We also discussed the need for a sentinel lymph node biopsy. We discussed the potential acute and late toxicities of radiation therapy. She appears to be a good candidate for hypo-fractionated radiation therapy. She'll have a staging MRI scan this Friday and then undergo a partial mastectomy and sentinel lymph node biopsy with Dr. Magnus Ivan. I can see her following her surgery.   PLAN: As discussed above.   I spent 40 minutes minutes face to face with the patient and more than 50% of that time was spent in counseling and/or coordination of care.

## 2013-05-29 NOTE — Patient Instructions (Addendum)
Proceed with lumpectomy and sentinel lymph node biopsy.  I will see you back in 4-5 weeks time

## 2013-05-30 ENCOUNTER — Other Ambulatory Visit: Payer: Medicare Other

## 2013-05-30 ENCOUNTER — Telehealth: Payer: Self-pay | Admitting: Oncology

## 2013-05-30 NOTE — Telephone Encounter (Signed)
, °

## 2013-05-31 ENCOUNTER — Ambulatory Visit
Admission: RE | Admit: 2013-05-31 | Discharge: 2013-05-31 | Disposition: A | Discharge status: Home / Self Care | Payer: Medicare Other | Source: Ambulatory Visit | Attending: Family Medicine | Admitting: Family Medicine

## 2013-05-31 DIAGNOSIS — D0590 Unspecified type of carcinoma in situ of unspecified breast: Secondary | ICD-10-CM | POA: Diagnosis not present

## 2013-05-31 DIAGNOSIS — C50911 Malignant neoplasm of unspecified site of right female breast: Secondary | ICD-10-CM

## 2013-05-31 MED ORDER — GADOBENATE DIMEGLUMINE 529 MG/ML IV SOLN
12.0000 mL | Freq: Once | INTRAVENOUS | Status: AC | PRN
  Administered 2013-05-31: 12 mL via INTRAVENOUS

## 2013-06-01 ENCOUNTER — Encounter: Payer: Self-pay | Admitting: *Deleted

## 2013-06-01 NOTE — Progress Notes (Signed)
Mailed after appt letter to pt. 

## 2013-06-03 ENCOUNTER — Telehealth: Payer: Self-pay | Admitting: *Deleted

## 2013-06-03 NOTE — Telephone Encounter (Signed)
Pt called stating that she could not attend her asppt for 06/27/13. gv appt d/t for 07/05/13 @ 2:45pm. Pt is aware...td

## 2013-06-03 NOTE — Telephone Encounter (Signed)
Left message for a return phone call.

## 2013-06-05 ENCOUNTER — Encounter (HOSPITAL_COMMUNITY): Payer: Self-pay | Admitting: Pharmacy Technician

## 2013-06-06 ENCOUNTER — Telehealth: Payer: Self-pay | Admitting: *Deleted

## 2013-06-06 ENCOUNTER — Other Ambulatory Visit (INDEPENDENT_AMBULATORY_CARE_PROVIDER_SITE_OTHER): Payer: Self-pay | Admitting: Surgery

## 2013-06-06 DIAGNOSIS — C50911 Malignant neoplasm of unspecified site of right female breast: Secondary | ICD-10-CM

## 2013-06-06 NOTE — Telephone Encounter (Signed)
Left message for a return phone call.

## 2013-06-10 ENCOUNTER — Encounter (HOSPITAL_COMMUNITY)
Admission: RE | Admit: 2013-06-10 | Discharge: 2013-06-10 | Disposition: A | Discharge status: Home / Self Care | Payer: Medicare Other | Source: Ambulatory Visit | Attending: Surgery | Admitting: Surgery

## 2013-06-10 ENCOUNTER — Encounter (HOSPITAL_COMMUNITY): Payer: Self-pay

## 2013-06-10 DIAGNOSIS — K589 Irritable bowel syndrome without diarrhea: Secondary | ICD-10-CM | POA: Diagnosis not present

## 2013-06-10 DIAGNOSIS — Z170 Estrogen receptor positive status [ER+]: Secondary | ICD-10-CM | POA: Diagnosis not present

## 2013-06-10 DIAGNOSIS — D0590 Unspecified type of carcinoma in situ of unspecified breast: Secondary | ICD-10-CM | POA: Diagnosis not present

## 2013-06-10 DIAGNOSIS — Z7982 Long term (current) use of aspirin: Secondary | ICD-10-CM | POA: Diagnosis not present

## 2013-06-10 DIAGNOSIS — J309 Allergic rhinitis, unspecified: Secondary | ICD-10-CM | POA: Diagnosis not present

## 2013-06-10 DIAGNOSIS — Z880 Allergy status to penicillin: Secondary | ICD-10-CM | POA: Diagnosis not present

## 2013-06-10 DIAGNOSIS — Z881 Allergy status to other antibiotic agents status: Secondary | ICD-10-CM | POA: Diagnosis not present

## 2013-06-10 DIAGNOSIS — K5730 Diverticulosis of large intestine without perforation or abscess without bleeding: Secondary | ICD-10-CM | POA: Diagnosis not present

## 2013-06-10 DIAGNOSIS — E785 Hyperlipidemia, unspecified: Secondary | ICD-10-CM | POA: Diagnosis not present

## 2013-06-10 DIAGNOSIS — Z803 Family history of malignant neoplasm of breast: Secondary | ICD-10-CM | POA: Diagnosis not present

## 2013-06-10 DIAGNOSIS — Z801 Family history of malignant neoplasm of trachea, bronchus and lung: Secondary | ICD-10-CM | POA: Diagnosis not present

## 2013-06-10 DIAGNOSIS — Z79899 Other long term (current) drug therapy: Secondary | ICD-10-CM | POA: Diagnosis not present

## 2013-06-10 DIAGNOSIS — C50419 Malignant neoplasm of upper-outer quadrant of unspecified female breast: Secondary | ICD-10-CM | POA: Diagnosis not present

## 2013-06-10 DIAGNOSIS — Z800 Family history of malignant neoplasm of digestive organs: Secondary | ICD-10-CM | POA: Diagnosis not present

## 2013-06-10 DIAGNOSIS — M129 Arthropathy, unspecified: Secondary | ICD-10-CM | POA: Diagnosis not present

## 2013-06-10 DIAGNOSIS — Z86010 Personal history of colonic polyps: Secondary | ICD-10-CM | POA: Diagnosis not present

## 2013-06-10 HISTORY — DX: Thyrotoxicosis with diffuse goiter without thyrotoxic crisis or storm: E05.00

## 2013-06-10 MED ORDER — CEFAZOLIN SODIUM-DEXTROSE 2-3 GM-% IV SOLR
2.0000 g | INTRAVENOUS | Status: AC
  Administered 2013-06-11: 2 g via INTRAVENOUS
  Filled 2013-06-10: qty 50

## 2013-06-10 NOTE — Progress Notes (Signed)
Had labs drawn 05/29/2013, which are in normal limits.......DA

## 2013-06-10 NOTE — Pre-Procedure Instructions (Signed)
[name]  06/10/2013   Your procedure is scheduled on:  Tuesday, July 22nd   Report to [facility] at 8:00 AM.  Call this number if you have problems the morning of surgery: [phone]   Remember:   Do not eat food or drink liquids after midnight tonight.   Take these medicines the morning of surgery with A SIP OF WATER: Wellbutrin, Levothyroxine   Do not wear jewelry, make-up or nail polish.  Do not wear lotions, powders, or perfumes. You may NOT wear deodorant.  Do not shave 48 hours prior to surgery.    Do not bring valuables to the hospital.  [facility] is not responsible for any belongings or valuables.  Contacts, dentures or bridgework may not be worn into surgery.  Leave suitcase in the car. After surgery it may be brought to your room.   Patients discharged the day of surgery will not be allowed to drive home and will need someone to stay with you for the first 24 hrs.   Name and phone number of your driver:    Special Instructions: Shower using CHG 2 nights before surgery and the night before surgery.  If you shower the day of surgery use CHG.  Use special wash - you have one bottle of CHG for all showers.  You should use approximately 1/3 of the bottle for each shower.   Please read over the following fact sheets that you were given: Pain Booklet and Surgical Site Infection Prevention

## 2013-06-11 ENCOUNTER — Ambulatory Visit (HOSPITAL_COMMUNITY)
Admission: RE | Admit: 2013-06-11 | Discharge: 2013-06-11 | Disposition: A | Discharge status: Home / Self Care | Payer: Medicare Other | Source: Ambulatory Visit | Attending: Surgery | Admitting: Surgery

## 2013-06-11 ENCOUNTER — Emergency Department (HOSPITAL_COMMUNITY)
Admission: EM | Admit: 2013-06-11 | Discharge: 2013-06-11 | Disposition: A | Discharge status: Home / Self Care | Payer: Medicare Other | Attending: Emergency Medicine | Admitting: Emergency Medicine

## 2013-06-11 ENCOUNTER — Encounter (HOSPITAL_COMMUNITY): Payer: Self-pay | Admitting: *Deleted

## 2013-06-11 ENCOUNTER — Encounter (HOSPITAL_COMMUNITY): Payer: Self-pay | Admitting: Anesthesiology

## 2013-06-11 ENCOUNTER — Ambulatory Visit (HOSPITAL_COMMUNITY): Payer: Medicare Other | Admitting: Anesthesiology

## 2013-06-11 ENCOUNTER — Encounter (HOSPITAL_COMMUNITY)
Admission: RE | Disposition: A | Discharge status: Home / Self Care | Payer: Self-pay | Source: Ambulatory Visit | Attending: Surgery

## 2013-06-11 ENCOUNTER — Ambulatory Visit
Admission: RE | Admit: 2013-06-11 | Discharge: 2013-06-11 | Disposition: A | Discharge status: Home / Self Care | Payer: Medicare Other | Source: Ambulatory Visit | Attending: Surgery | Admitting: Surgery

## 2013-06-11 DIAGNOSIS — C50919 Malignant neoplasm of unspecified site of unspecified female breast: Secondary | ICD-10-CM | POA: Diagnosis not present

## 2013-06-11 DIAGNOSIS — K589 Irritable bowel syndrome without diarrhea: Secondary | ICD-10-CM | POA: Insufficient documentation

## 2013-06-11 DIAGNOSIS — Z880 Allergy status to penicillin: Secondary | ICD-10-CM | POA: Insufficient documentation

## 2013-06-11 DIAGNOSIS — Y929 Unspecified place or not applicable: Secondary | ICD-10-CM | POA: Insufficient documentation

## 2013-06-11 DIAGNOSIS — Z7982 Long term (current) use of aspirin: Secondary | ICD-10-CM | POA: Insufficient documentation

## 2013-06-11 DIAGNOSIS — Z800 Family history of malignant neoplasm of digestive organs: Secondary | ICD-10-CM | POA: Insufficient documentation

## 2013-06-11 DIAGNOSIS — D0590 Unspecified type of carcinoma in situ of unspecified breast: Secondary | ICD-10-CM | POA: Diagnosis not present

## 2013-06-11 DIAGNOSIS — Z803 Family history of malignant neoplasm of breast: Secondary | ICD-10-CM | POA: Insufficient documentation

## 2013-06-11 DIAGNOSIS — Z79899 Other long term (current) drug therapy: Secondary | ICD-10-CM | POA: Insufficient documentation

## 2013-06-11 DIAGNOSIS — M129 Arthropathy, unspecified: Secondary | ICD-10-CM | POA: Insufficient documentation

## 2013-06-11 DIAGNOSIS — X58XXXA Exposure to other specified factors, initial encounter: Secondary | ICD-10-CM | POA: Insufficient documentation

## 2013-06-11 DIAGNOSIS — C50411 Malignant neoplasm of upper-outer quadrant of right female breast: Secondary | ICD-10-CM

## 2013-06-11 DIAGNOSIS — K5730 Diverticulosis of large intestine without perforation or abscess without bleeding: Secondary | ICD-10-CM | POA: Diagnosis not present

## 2013-06-11 DIAGNOSIS — Z881 Allergy status to other antibiotic agents status: Secondary | ICD-10-CM | POA: Insufficient documentation

## 2013-06-11 DIAGNOSIS — Z860100 Personal history of colon polyps, unspecified: Secondary | ICD-10-CM | POA: Insufficient documentation

## 2013-06-11 DIAGNOSIS — S2000XA Contusion of breast, unspecified breast, initial encounter: Secondary | ICD-10-CM | POA: Diagnosis not present

## 2013-06-11 DIAGNOSIS — Z170 Estrogen receptor positive status [ER+]: Secondary | ICD-10-CM | POA: Diagnosis not present

## 2013-06-11 DIAGNOSIS — C50419 Malignant neoplasm of upper-outer quadrant of unspecified female breast: Secondary | ICD-10-CM | POA: Diagnosis not present

## 2013-06-11 DIAGNOSIS — T148XXA Other injury of unspecified body region, initial encounter: Secondary | ICD-10-CM

## 2013-06-11 DIAGNOSIS — C50911 Malignant neoplasm of unspecified site of right female breast: Secondary | ICD-10-CM

## 2013-06-11 DIAGNOSIS — E785 Hyperlipidemia, unspecified: Secondary | ICD-10-CM | POA: Insufficient documentation

## 2013-06-11 DIAGNOSIS — Y838 Other surgical procedures as the cause of abnormal reaction of the patient, or of later complication, without mention of misadventure at the time of the procedure: Secondary | ICD-10-CM | POA: Insufficient documentation

## 2013-06-11 DIAGNOSIS — Y9389 Activity, other specified: Secondary | ICD-10-CM | POA: Insufficient documentation

## 2013-06-11 DIAGNOSIS — Z801 Family history of malignant neoplasm of trachea, bronchus and lung: Secondary | ICD-10-CM | POA: Insufficient documentation

## 2013-06-11 DIAGNOSIS — J309 Allergic rhinitis, unspecified: Secondary | ICD-10-CM | POA: Insufficient documentation

## 2013-06-11 HISTORY — PX: BREAST LUMPECTOMY WITH NEEDLE LOCALIZATION AND AXILLARY SENTINEL LYMPH NODE BX: SHX5760

## 2013-06-11 SURGERY — BREAST LUMPECTOMY WITH NEEDLE LOCALIZATION AND AXILLARY SENTINEL LYMPH NODE BX
Anesthesia: General | Site: Breast | Laterality: Right | Wound class: Clean

## 2013-06-11 MED ORDER — SODIUM CHLORIDE 0.9 % IJ SOLN
INTRAMUSCULAR | Status: DC | PRN
  Administered 2013-06-11: 3 mL

## 2013-06-11 MED ORDER — PROPOFOL 10 MG/ML IV BOLUS
INTRAVENOUS | Status: DC | PRN
  Administered 2013-06-11: 200 mg via INTRAVENOUS

## 2013-06-11 MED ORDER — MIDAZOLAM HCL 2 MG/2ML IJ SOLN: 1.0000 mg | INTRAMUSCULAR | Status: DC | PRN

## 2013-06-11 MED ORDER — LACTATED RINGERS IV SOLN
INTRAVENOUS | Status: DC
  Administered 2013-06-11: 09:00:00 via INTRAVENOUS

## 2013-06-11 MED ORDER — BUPIVACAINE-EPINEPHRINE 0.25% -1:200000 IJ SOLN
INTRAMUSCULAR | Status: DC | PRN
  Administered 2013-06-11: 20 mL

## 2013-06-11 MED ORDER — METHYLENE BLUE 1 % INJ SOLN
INTRAMUSCULAR | Status: DC | PRN
  Administered 2013-06-11: 2 mL via SUBMUCOSAL

## 2013-06-11 MED ORDER — FENTANYL CITRATE 0.05 MG/ML IJ SOLN: 50.0000 ug | INTRAMUSCULAR | Status: DC | PRN

## 2013-06-11 MED ORDER — MIDAZOLAM HCL 2 MG/2ML IJ SOLN
INTRAMUSCULAR | Status: AC
  Administered 2013-06-11: 2 mg via INTRAVENOUS
  Filled 2013-06-11: qty 2

## 2013-06-11 MED ORDER — TECHNETIUM TC 99M SULFUR COLLOID FILTERED
1.0000 | Freq: Once | INTRAVENOUS | Status: AC | PRN
  Administered 2013-06-11: 1 via INTRADERMAL

## 2013-06-11 MED ORDER — BUPIVACAINE-EPINEPHRINE PF 0.25-1:200000 % IJ SOLN
INTRAMUSCULAR | Status: AC
  Filled 2013-06-11: qty 30

## 2013-06-11 MED ORDER — KETOROLAC TROMETHAMINE 30 MG/ML IJ SOLN
INTRAMUSCULAR | Status: AC
  Filled 2013-06-11: qty 1

## 2013-06-11 MED ORDER — FENTANYL CITRATE 0.05 MG/ML IJ SOLN
INTRAMUSCULAR | Status: AC
  Administered 2013-06-11: 100 ug via INTRAVENOUS
  Filled 2013-06-11: qty 2

## 2013-06-11 MED ORDER — HYDROCODONE-ACETAMINOPHEN 5-325 MG PO TABS: 2.0000 | ORAL_TABLET | Freq: Four times a day (QID) | ORAL | Status: DC | PRN

## 2013-06-11 MED ORDER — MIDAZOLAM HCL 5 MG/5ML IJ SOLN
INTRAMUSCULAR | Status: DC | PRN
  Administered 2013-06-11: 2 mg via INTRAVENOUS

## 2013-06-11 MED ORDER — PROMETHAZINE HCL 25 MG/ML IJ SOLN: 6.2500 mg | INTRAMUSCULAR | Status: DC | PRN

## 2013-06-11 MED ORDER — DEXAMETHASONE SODIUM PHOSPHATE 4 MG/ML IJ SOLN
INTRAMUSCULAR | Status: DC | PRN
  Administered 2013-06-11: 4 mg via INTRAVENOUS

## 2013-06-11 MED ORDER — FENTANYL CITRATE 0.05 MG/ML IJ SOLN
25.0000 ug | INTRAMUSCULAR | Status: DC | PRN
  Administered 2013-06-11: 25 ug via INTRAVENOUS
  Administered 2013-06-11: 50 ug via INTRAVENOUS
  Administered 2013-06-11: 25 ug via INTRAVENOUS

## 2013-06-11 MED ORDER — FENTANYL CITRATE 0.05 MG/ML IJ SOLN
INTRAMUSCULAR | Status: AC
  Filled 2013-06-11: qty 2

## 2013-06-11 MED ORDER — KETOROLAC TROMETHAMINE 30 MG/ML IJ SOLN
15.0000 mg | Freq: Once | INTRAMUSCULAR | Status: AC | PRN
  Administered 2013-06-11: 30 mg via INTRAVENOUS

## 2013-06-11 MED ORDER — FENTANYL CITRATE 0.05 MG/ML IJ SOLN
INTRAMUSCULAR | Status: DC | PRN
  Administered 2013-06-11: 100 ug via INTRAVENOUS
  Administered 2013-06-11 (×3): 50 ug via INTRAVENOUS

## 2013-06-11 MED ORDER — LACTATED RINGERS IV SOLN
INTRAVENOUS | Status: DC | PRN
  Administered 2013-06-11 (×2): via INTRAVENOUS

## 2013-06-11 MED ORDER — METHYLENE BLUE 1 % INJ SOLN
INTRAMUSCULAR | Status: AC
  Filled 2013-06-11: qty 10

## 2013-06-11 MED ORDER — KETOROLAC TROMETHAMINE 30 MG/ML IJ SOLN
INTRAMUSCULAR | Status: DC | PRN
  Administered 2013-06-11: 30 mg via INTRAVENOUS

## 2013-06-11 MED ORDER — MEPERIDINE HCL 25 MG/ML IJ SOLN: 6.2500 mg | INTRAMUSCULAR | Status: DC | PRN

## 2013-06-11 MED ORDER — ONDANSETRON HCL 4 MG/2ML IJ SOLN
INTRAMUSCULAR | Status: DC | PRN
  Administered 2013-06-11: 4 mg via INTRAVENOUS

## 2013-06-11 MED ORDER — LIDOCAINE HCL (CARDIAC) 20 MG/ML IV SOLN
INTRAVENOUS | Status: DC | PRN
  Administered 2013-06-11: 100 mg via INTRAVENOUS

## 2013-06-11 MED ORDER — MIDAZOLAM HCL 2 MG/2ML IJ SOLN: 0.5000 mg | Freq: Once | INTRAMUSCULAR | Status: DC | PRN

## 2013-06-11 SURGICAL SUPPLY — 49 items
APPLIER CLIP 9.375 MED OPEN (MISCELLANEOUS) ×2
BENZOIN TINCTURE PRP APPL 2/3 (GAUZE/BANDAGES/DRESSINGS) ×2 IMPLANT
BINDER BREAST LRG (GAUZE/BANDAGES/DRESSINGS) IMPLANT
BINDER BREAST XLRG (GAUZE/BANDAGES/DRESSINGS) IMPLANT
BLADE SURG 15 STRL LF DISP TIS (BLADE) ×1 IMPLANT
BLADE SURG 15 STRL SS (BLADE) ×2
CANISTER SUCTION 2500CC (MISCELLANEOUS) ×2 IMPLANT
CHLORAPREP W/TINT 26ML (MISCELLANEOUS) ×2 IMPLANT
CLIP APPLIE 9.375 MED OPEN (MISCELLANEOUS) ×1 IMPLANT
CLOTH BEACON ORANGE TIMEOUT ST (SAFETY) ×2 IMPLANT
CONT SPEC 4OZ CLIKSEAL STRL BL (MISCELLANEOUS) ×4 IMPLANT
COVER PROBE W GEL 5X96 (DRAPES) ×2 IMPLANT
COVER SURGICAL LIGHT HANDLE (MISCELLANEOUS) ×2 IMPLANT
DEVICE DUBIN SPECIMEN MAMMOGRA (MISCELLANEOUS) ×2 IMPLANT
DRAPE LAPAROSCOPIC ABDOMINAL (DRAPES) ×2 IMPLANT
DRSG TEGADERM 4X4.75 (GAUZE/BANDAGES/DRESSINGS) ×2 IMPLANT
ELECT CAUTERY BLADE 6.4 (BLADE) ×2 IMPLANT
ELECT REM PT RETURN 9FT ADLT (ELECTROSURGICAL) ×2
ELECTRODE REM PT RTRN 9FT ADLT (ELECTROSURGICAL) ×1 IMPLANT
GLOVE BIOGEL PI IND STRL 7.0 (GLOVE) ×2 IMPLANT
GLOVE BIOGEL PI IND STRL 7.5 (GLOVE) ×1 IMPLANT
GLOVE BIOGEL PI INDICATOR 7.0 (GLOVE) ×2
GLOVE BIOGEL PI INDICATOR 7.5 (GLOVE) ×1
GLOVE ECLIPSE 7.5 STRL STRAW (GLOVE) ×2 IMPLANT
GLOVE SURG SIGNA 7.5 PF LTX (GLOVE) ×2 IMPLANT
GLOVE SURG SS PI 7.0 STRL IVOR (GLOVE) ×4 IMPLANT
GOWN STRL NON-REIN LRG LVL3 (GOWN DISPOSABLE) ×2 IMPLANT
GOWN STRL REIN XL XLG (GOWN DISPOSABLE) ×2 IMPLANT
KIT BASIN OR (CUSTOM PROCEDURE TRAY) ×2 IMPLANT
KIT MARKER MARGIN INK (KITS) ×2 IMPLANT
KIT ROOM TURNOVER OR (KITS) ×2 IMPLANT
NEEDLE 18GX1X1/2 (RX/OR ONLY) (NEEDLE) ×2 IMPLANT
NEEDLE HYPO 25GX1X1/2 BEV (NEEDLE) ×4 IMPLANT
NS IRRIG 1000ML POUR BTL (IV SOLUTION) ×2 IMPLANT
PACK SURGICAL SETUP 50X90 (CUSTOM PROCEDURE TRAY) ×2 IMPLANT
PAD ARMBOARD 7.5X6 YLW CONV (MISCELLANEOUS) ×2 IMPLANT
PENCIL BUTTON HOLSTER BLD 10FT (ELECTRODE) ×2 IMPLANT
SPONGE GAUZE 4X4 12PLY (GAUZE/BANDAGES/DRESSINGS) ×2 IMPLANT
SPONGE LAP 4X18 X RAY DECT (DISPOSABLE) ×4 IMPLANT
STRIP CLOSURE SKIN 1/2X4 (GAUZE/BANDAGES/DRESSINGS) ×2 IMPLANT
SUT MON AB 4-0 PC3 18 (SUTURE) ×2 IMPLANT
SUT VIC AB 3-0 SH 27 (SUTURE) ×1
SUT VIC AB 3-0 SH 27XBRD (SUTURE) ×1 IMPLANT
SYR BULB 3OZ (MISCELLANEOUS) ×2 IMPLANT
SYR CONTROL 10ML LL (SYRINGE) ×4 IMPLANT
TOWEL OR 17X24 6PK STRL BLUE (TOWEL DISPOSABLE) ×2 IMPLANT
TOWEL OR 17X26 10 PK STRL BLUE (TOWEL DISPOSABLE) ×2 IMPLANT
TUBE CONNECTING 12X1/4 (SUCTIONS) ×2 IMPLANT
YANKAUER SUCT BULB TIP NO VENT (SUCTIONS) ×2 IMPLANT

## 2013-06-11 NOTE — Interval H&P Note (Signed)
History and Physical Interval Note: no change in h and P  06/11/2013 10:00 AM  [name]  has presented today for surgery, with the diagnosis of RIGHT BREAST CANCER   The various methods of treatment have been discussed with the patient and family. After consideration of risks, benefits and other options for treatment, the patient has consented to  Procedure(s) with comments: BREAST LUMPECTOMY WITH NEEDLE LOCALIZATION AND AXILLARY SENTINEL LYMPH NODE BX (Right) - Needle localization BCG 7:30 nuc med 9:30   as a surgical intervention .  The patient's history has been reviewed, patient examined, no change in status, stable for surgery.  I have reviewed the patient's chart and labs.  Questions were answered to the patient's satisfaction.     [name] A

## 2013-06-11 NOTE — Transfer of Care (Signed)
Immediate Anesthesia Transfer of Care Note  Patient: [name]  Procedure(s) Performed: Procedure(s) with comments: BREAST LUMPECTOMY WITH NEEDLE LOCALIZATION AND AXILLARY SENTINEL LYMPH NODE BX (Right) - Needle localization BCG 7:30 nuc med 9:30    Patient Location: PACU  Anesthesia Type:General  Level of Consciousness: sedated  Airway & Oxygen Therapy: Patient Spontanous Breathing and Patient connected to nasal cannula oxygen  Post-op Assessment: Report given to PACU RN and Post -op Vital signs reviewed and stable  Post vital signs: Reviewed and stable  Complications: No apparent anesthesia complications

## 2013-06-11 NOTE — Op Note (Signed)
BREAST LUMPECTOMY WITH NEEDLE LOCALIZATION AND AXILLARY SENTINEL LYMPH NODE BX  Procedure Note  [name] 06/11/2013   Pre-op Diagnosis: RIGHT BREAST CANCER      Post-op Diagnosis: same  Procedure(s): RIGHT BREAST PARTIAL MASTECTOMY WITH NEEDLE LOCALIZATION AND AXILLARY SENTINEL LYMPH NODE BX INJECTION OF BLUE DYE  Surgeon(s): Shelly Rubenstein, MD  Anesthesia: General  Staff:  Circulator: Gerre Pebbles Sipsis, RN; Ursula Beath, RN Relief Scrub: Doy Mince, RN Scrub Person: Leighton Parody, CST; Alpha Gula, RN Circulator Assistant: Aundra Millet Day Cavanaugh, RN; Doy Mince, RN  Estimated Blood Loss: Minimal               Specimens:nodes x 2, breast tissue          [name]   Date: 06/11/2013  Time: 11:43 AM

## 2013-06-11 NOTE — ED Notes (Addendum)
Dr Johna Sheriff paged, pager number [phone]

## 2013-06-11 NOTE — Discharge Summary (Signed)
  History: The patient underwent right breast lumpectomy and sentinel lymph node biopsy today. She was discharged home several hours ago. She initially had a lot of nausea and vomiting which is now resolved. She just recently checked her right breast and noted quite a bit of swelling and bruising. No real discomfort. She called from home and I advised that I see her in the emergency room.  Exam: BP 154/92  Pulse 90  Temp(Src) 98.5 F (36.9 C) (Oral)  Resp 16  SpO2 99% There is moderate swelling of the right breast somewhat diffusely laterally with moderate ecchymosis outside the area of the bandage. There is no external bleeding. There is not a tense hematoma.  Assessment and plan: Some degree of postop bleeding post lumpectomy and sentinel lymph node biopsy. I do not feel a large tense hematoma or any indication at this point of ongoing bleeding or need to return to the operating room for evacuation. She is given a breast binder and instructed to use this for compression as well as an ice pack and to elevate her head and chest at home. She had her husband understand this and they understand to call for any worsening swelling or bleeding or other worrisome symptoms.

## 2013-06-11 NOTE — Preoperative (Signed)
Beta Blockers   Reason not to administer Beta Blockers:Not Applicable 

## 2013-06-11 NOTE — Op Note (Signed)
[name], [name] NO.:  [id number]  MEDICAL RECORD NO.:  [id number]  LOCATION:  [id number]                         FACILITY:  [facility]  PHYSICIAN:  Abigail Miyamoto, M.D. DATE OF BIRTH:  03-31-1946  DATE OF PROCEDURE:  06/11/2013 DATE OF DISCHARGE:  06/11/2013                              OPERATIVE REPORT   PREOPERATIVE DIAGNOSIS:  Right breast cancer.  POSTOPERATIVE DIAGNOSIS:  Right breast cancer.  PROCEDURE: 1. Right breast needle localized partial mastectomy with right     axillary sentinel lymph node biopsy. 2. Injection of blue dye.  SURGEON:  Abigail Miyamoto, MD  ANESTHESIA:  General and 0.5% Marcaine.  ESTIMATED BLOOD LOSS:  Minimal.  INDICATIONS:  This is a 67 year old female, who was found to have abnormal microcalcifications on screening mammography.  Biopsy proved an invasive malignancy in the right breast.  Therefore, decision made to proceed with a partial mastectomy and sentinel node biopsy.  FINDINGS:  The suspicious area was confirmed to be in the partial mastectomy specimen under radiography.  Two sentinel lymph nodes were removed and sent to Pathology for evaluation.  PROCEDURE IN DETAIL:  The patient was identified in the holding area and radioactive isotope was injected into the right breast.  She was then taken to the operating room, identified as correct patient, placed in supine position on the operating table.  General anesthesia was then induced.  I then prepped the right breast and injected blue dye underneath the areola and gently massaged the breast.  Her right breast chest was then formally prepped and draped in usual sterile fashion.  I made elliptical incision around the localization wire in the upper outer quadrant of the right breast.  I took this down as the breast tissue with electrocautery.  I then did a partial mastectomy including all the tissue in the upper outer quadrant, going down to the chest wall of the right  breast.  Once the specimen was completely removed with the cautery, I painted all quadrants with marker paint and then the specimen was x-rayed.  The suspicious area was confirmed to be in the biopsy specimen.  This was sent to Pathology for evaluation.  I achieved hemostasis with electrocautery.  Since the incision was in the right upper quadrant, I was able to identify the axilla easily and using Neoprobe was able to identify lymph nodes in the axilla.  The first lymph node was identified and found to have uptake of radioactive isotope as well as blue dye.  There was a 2nd node adjacent to this as well as 1 deep in the axilla that had uptake of radioisotope as well. The nodes were removed and sent to Pathology for evaluation.  I again examined the nodal basin and found no other increased uptake.  At this point, the wound was thoroughly irrigated with normal saline.  I placed surgical clips around the biopsy area and then closed subcutaneous tissue with interrupted 3-0 Vicryl sutures and closed the skin with running 4-0 Monocryl.  Steri-Strips, gauze, and Tegaderm were then applied.  The patient tolerated the procedure well.  All counts were correct at the end of the procedure. The patient was then extubated in the operating  room and taken in stable condition to recovery room.     Abigail Miyamoto, M.D.     DB/MEDQ  D:  06/11/2013  T:  06/11/2013  Job:  [id number]

## 2013-06-11 NOTE — H&P (Signed)
Patient ID: [name], female DOB: Apr 07, 1946, 67 y.o. MRN: [id number]  Chief Complaint   Patient presents with   .  Other     right breast cancer   HPI  [name] is a 67 y.o. female.  HPI  This is a pleasant female who is referred by Dr. Catha Gosselin for the recent diagnosis of a right breast cancer. [name] had abnormal calcifications detected in the right breast on recent screening mammography. Biopsy was performed confirming malignancy. [name]'s had a previous biopsy for benign disease approximately 20 years ago. [name] has had no other problems regarding her breast. [name] denies nipple discharge. [name] is otherwise without complaints.  Past Medical History   Diagnosis  Date   .  Diverticulosis    .  Adenomatous colon polyp    .  Arthritis    .  Allergic rhinitis    .  HLD (hyperlipidemia)    .  IBS (irritable bowel syndrome)    .  Breast cancer     Past Surgical History   Procedure  Laterality  Date   .  Colonoscopy w/ biopsies and polypectomy   multiple   .  Breast biopsy     .  Knee arthroscopy       Left Knee    Family History   Problem  Relation  Age of Onset   .  Colon cancer  Maternal Grandfather    .  Heart disease  Mother      MI   .  Lung cancer  Mother    .  Hypertension  Mother    .  Diabetes       grandmother   .  Alcohol abuse  Father    .  COPD  Father    .  Breast cancer       aunts   .  Breast cancer  Maternal Aunt    .  Pancreatic cancer  Maternal Aunt    Social History  History   Substance Use Topics   .  Smoking status:  Never Smoker   .  Smokeless tobacco:  Never Used   .  Alcohol Use:  Yes    Allergies   Allergen  Reactions   .  Amoxicillin      diarrhea   .  Terak (Terramycin)      rash    Current Outpatient Prescriptions   Medication  Sig  Dispense  Refill   .  aspirin 81 MG tablet  Take 81 mg by mouth daily.     Marland Kitchen  atorvastatin (LIPITOR) 10 MG tablet  Take 150 mg by mouth daily.     Marland Kitchen  buPROPion (WELLBUTRIN SR) 150 MG 12 hr  tablet  Take 150 mg by mouth 1 day or 1 dose.     .  celecoxib (CELEBREX) 200 MG capsule  Take 200 mg by mouth daily.     .  Echinacea 125 MG CAPS  Take by mouth as needed.     Marland Kitchen  glucosamine-chondroitin 500-400 MG tablet  Take 1 tablet by mouth. 2 by mouth once daily     .  ibuprofen (ADVIL) 200 MG tablet  Take 200 mg by mouth every 6 (six) hours as needed.     Marland Kitchen  levothyroxine (SYNTHROID, LEVOTHROID) 88 MCG tablet  Take 88 mcg by mouth daily.     .  magnesium 30 MG tablet  Take 5 mg by mouth at bedtime.     Marland Kitchen  Nutritional Supplements (MELATONIN PO)  Take 6 mg by mouth at bedtime.     .  polyethylene glycol (MIRALAX / GLYCOLAX) packet  Take 17 g by mouth daily. As needed     .  Zolpidem Tartrate (AMBIEN PO)  Take by mouth as needed.      No current facility-administered medications for this visit.   Review of Systems  Review of Systems  Constitutional: Negative for fever, chills and unexpected weight change.  HENT: Negative for hearing loss, congestion, sore throat, trouble swallowing and voice change.  Eyes: Negative for visual disturbance.  Respiratory: Negative for cough and wheezing.  Cardiovascular: Negative for chest pain, palpitations and leg swelling.  Gastrointestinal: Negative for nausea, vomiting, abdominal pain, diarrhea, constipation, blood in stool, abdominal distention and anal bleeding.  Genitourinary: Negative for hematuria, vaginal bleeding and difficulty urinating.  Musculoskeletal: Negative for arthralgias.  Skin: Negative for rash and wound.  Neurological: Negative for seizures, syncope and headaches.  Hematological: Negative for adenopathy. Bruises/bleeds easily.  Psychiatric/Behavioral: Negative for confusion.  There were no vitals taken for this visit.  Physical Exam  Physical Exam  Constitutional: [name] is oriented to person, place, and time. [name] appears well-developed and well-nourished. No distress.  HENT:  Head: Normocephalic and atraumatic.  Right Ear:  External ear normal.  Left Ear: External ear normal.  Nose: Nose normal.  Mouth/Throat: Oropharynx is clear and moist. No oropharyngeal exudate.  Eyes: Conjunctivae are normal. Pupils are equal, round, and reactive to light. Right eye exhibits no discharge. Left eye exhibits no discharge. No scleral icterus.  Neck: Normal range of motion. Neck supple. No tracheal deviation present. No thyromegaly present.  Cardiovascular: Normal rate, regular rhythm, normal heart sounds and intact distal pulses.  No murmur heard.  Pulmonary/Chest: Effort normal and breath sounds normal. No respiratory distress. [name] has no wheezes.  Musculoskeletal: Normal range of motion. [name] exhibits no edema and no tenderness.  Lymphadenopathy:  [name] has no cervical adenopathy.  Neurological: [name] is alert and oriented to person, place, and time.  Skin: Skin is warm and dry. No rash noted. No erythema.  Psychiatric: Her behavior is normal. Judgment normal.  Breasts: There are no palpable masses in the right breast. [name] has ecchymosis and a mild hematoma from the biopsy. There is no axillary or supraclavicular adenopathy on the right  Data Reviewed  I have reviewed her mammograms and pathology report. This demonstrates a 1.7 lobular in situ and small focus of invasive cancer. It is ER positive and PR negative  Assessment  Invasive right breast cancer  Plan  After discussion in the cancer conference, a right breast lumpectomy under needle localization with a right sentinel lymph node biopsy is recommended. This will tentatively be scheduled. [name]'ll be having an MRI on July 11 and we will change her plans accordingly if there are other abnormalities. I discussed the surgery with her in detail including the for similar biopsy. I discussed the risks of the surgery with her as well. Surgery will be scheduled.   Addendum:  MRI neg for any further abnormalities so will proceed with surgery

## 2013-06-11 NOTE — ED Notes (Addendum)
Pt had breast surgery today performed by Magnus Ivan. [facility] md called and stated believed pt has hematoma. [facility] requesting to be paged immediately when pt arrives. [phone]. Pt does not need to see EDP.  Pt reports dc home, around 1715 noticed acute breast pressure. Noticeable swelling to right breast with bruising.

## 2013-06-11 NOTE — Anesthesia Preprocedure Evaluation (Signed)
Anesthesia Evaluation  Patient identified by MRN, date of birth, ID band Patient awake    Reviewed: Allergy & Precautions, H&P , Patient's Chart, lab work & pertinent test results, reviewed documented beta blocker date and time   History of Anesthesia Complications Negative for: history of anesthetic complications  Airway Mallampati: II TM Distance: >3 FB Neck ROM: full    Dental no notable dental hx.    Pulmonary neg pulmonary ROS,  breath sounds clear to auscultation  Pulmonary exam normal       Cardiovascular Exercise Tolerance: Good negative cardio ROS  Rhythm:regular Rate:Normal     Neuro/Psych negative neurological ROS  negative psych ROS   GI/Hepatic negative GI ROS, Neg liver ROS,   Endo/Other  negative endocrine ROSHyperthyroidism   Renal/GU negative Renal ROS     Musculoskeletal   Abdominal   Peds  Hematology negative hematology ROS (+)   Anesthesia Other Findings Diverticulosis     Adenomatous colon polyp        Arthritis     Allergic rhinitis        HLD (hyperlipidemia)     IBS (irritable bowel syndrome)        Breast cancer     Graves' disease   2011    Reproductive/Obstetrics negative OB ROS                           Anesthesia Physical Anesthesia Plan  ASA: II  Anesthesia Plan: General LMA   Post-op Pain Management:    Induction:   Airway Management Planned:   Additional Equipment:   Intra-op Plan:   Post-operative Plan:   Informed Consent: I have reviewed the patients History and Physical, chart, labs and discussed the procedure including the risks, benefits and alternatives for the proposed anesthesia with the patient or authorized representative who has indicated his/her understanding and acceptance.   Dental Advisory Given  Plan Discussed with: CRNA, Surgeon and Anesthesiologist  Anesthesia Plan Comments:         Anesthesia Quick Evaluation

## 2013-06-12 ENCOUNTER — Encounter (HOSPITAL_COMMUNITY): Payer: Self-pay | Admitting: Surgery

## 2013-06-12 ENCOUNTER — Telehealth (INDEPENDENT_AMBULATORY_CARE_PROVIDER_SITE_OTHER): Payer: Self-pay | Admitting: General Surgery

## 2013-06-12 NOTE — Telephone Encounter (Signed)
Called Mrs Quirion this morning per Dr Magnus Ivan to check on her to see how she is doing. She stated that she is feeling a lot better and the breast binder is helping with the swelling and she had a good night sleep. I made the patient an apt to come back in to see Dr Magnus Ivan on 07-02-13 @ 9:45 and told her if she needs anything or having any other problems with her breast to please call back to the office and ask for [facility]

## 2013-06-14 NOTE — Anesthesia Postprocedure Evaluation (Signed)
  Anesthesia Post Note  Patient: [name]  Procedure(s) Performed: Procedure(s) (LRB): BREAST LUMPECTOMY WITH NEEDLE LOCALIZATION AND AXILLARY SENTINEL LYMPH NODE BX (Right)  Anesthesia type: GA  Patient location: PACU  Post pain: Pain level controlled  Post assessment: Post-op Vital signs reviewed  Last Vitals:  Filed Vitals:   06/11/13 1405  BP: 152/74  Pulse: 57  Temp: 36.3 C  Resp:     Post vital signs: Reviewed  Level of consciousness: sedated  Complications: No apparent anesthesia complications

## 2013-06-19 NOTE — Progress Notes (Signed)
[name] [id number] 1946-04-18 67 y.o. 06/19/2013 9:53 PM  CC  [name], MD [address] [place] [address] Dr. Abigail Miyamoto Dr. Chipper Herb  REASON FOR CONSULTATION:  67 year old female with T1 N0 invasive lobular carcinoma/LCIS of the right breast. Patient is seen in the multidisciplinary breast clinic for discussion of treatment options.   STAGE:   Breast cancer of upper-outer quadrant of right female breast   Primary site: Breast (Right)   Staging method: AJCC 7th Edition   Clinical: Stage IA (T1c, N0, cM0)   Summary: Stage IA (T1c, N0, cM0)  REFERRING PHYSICIAN: Dr. Abigail Miyamoto  HISTORY OF PRESENT ILLNESS:  [name] is a 67 y.o. female.  Hu at the time of her screening mammogram on 04/23/2013 was found to have a suspicious calcifications in the right breast. 05/10/2013 patient had additional views performed that showed suspicious calcifications within the upper outer quadrant. She had a stereotactic core biopsy performed on 05/20/2013. The pathology revealed invasive lobular carcinoma with LCIS. Lymph node was benign. She had tumor that was ER positive PR negative with a Ki-67 of 10%. MRI is scheduled for July 11. Patient was seen by Dr. Abigail Miyamoto and Dr. Chipper Herb. Patient's case was discussed at the multidisciplinary breast conference. Her pathology and radiology were reviewed   Past Medical History: Past Medical History  Diagnosis Date  . Diverticulosis   . Adenomatous colon polyp   . Arthritis   . Allergic rhinitis   . HLD (hyperlipidemia)   . IBS (irritable bowel syndrome)   . Breast cancer   . Graves' disease     2011    Past Surgical History: Past Surgical History  Procedure Laterality Date  . Colonoscopy w/ biopsies and polypectomy  multiple  . Breast biopsy    . Knee arthroscopy      Left Knee  . Breast lumpectomy      06/11/13  with lymph node biopsy  . Breast lumpectomy with needle localization  and axillary sentinel lymph node bx Right 06/11/2013    Procedure: BREAST LUMPECTOMY WITH NEEDLE LOCALIZATION AND AXILLARY SENTINEL LYMPH NODE BX;  Surgeon: Shelly Rubenstein, MD;  Location: [facility] OR;  Service: General;  Laterality: Right;  Needle localization BCG 7:30 nuc med 9:30      Family History: Family History  Problem Relation Age of Onset  . Colon cancer Maternal Grandfather   . Heart disease Mother     MI  . Lung cancer Mother   . Hypertension Mother   . Diabetes      grandmother  . Alcohol abuse Father   . COPD Father   . Breast cancer      aunts  . Breast cancer Maternal Aunt   . Pancreatic cancer Maternal Aunt     Social History History  Substance Use Topics  . Smoking status: Never Smoker   . Smokeless tobacco: Never Used  . Alcohol Use: Yes     Comment: occasional    Allergies: Allergies  Allergen Reactions  . Amoxicillin     diarrhea  . Terak (Terramycin)     rash    Current Medications: Current Outpatient Prescriptions  Medication Sig Dispense Refill  . atorvastatin (LIPITOR) 10 MG tablet Take 10 mg by mouth every evening.       Marland Kitchen buPROPion (WELLBUTRIN SR) 150 MG 12 hr tablet Take 150 mg by mouth every morning.       . celecoxib (CELEBREX) 200 MG capsule Take 200  mg by mouth every morning.       . Echinacea 125 MG CAPS Take 125 mg by mouth as needed (Cold prophylaxis).       Marland Kitchen ibuprofen (ADVIL) 200 MG tablet Take 400 mg by mouth every 6 (six) hours as needed for pain.       Marland Kitchen levothyroxine (SYNTHROID, LEVOTHROID) 88 MCG tablet Take 88 mcg by mouth daily.      . Zolpidem Tartrate (AMBIEN PO) Take 1 tablet by mouth at bedtime as needed (Sleep).       Marland Kitchen aspirin EC 81 MG tablet Take 81 mg by mouth every morning.       . diclofenac sodium (VOLTAREN) 1 % GEL Apply 2 application topically daily as needed (Knee pain).      . fluticasone (FLONASE) 50 MCG/ACT nasal spray Place 2 sprays into the nose daily.      Marland Kitchen GLUCOSAMINE-CHONDROITIN PO Take 2 tablets by  mouth every morning.       Marland Kitchen HYDROcodone-acetaminophen (NORCO) 5-325 MG per tablet Take 2 tablets by mouth every 6 (six) hours as needed for pain.  30 tablet  1  . levothyroxine (SYNTHROID, LEVOTHROID) 100 MCG tablet Take 100 mcg by mouth every other day.      Marland Kitchen MAGNESIUM PO Take 1 tablet by mouth at bedtime.      . Melatonin 5 MG TABS Take 5 mg by mouth at bedtime.      . Probiotic Product (PROBIOTIC DAILY PO) Take 1 tablet by mouth daily.       No current facility-administered medications for this visit.    OB/GYN History:menarche at age 39 she underwent menopause in 86. She was on hormone replacement therapy for 9 years she stopped in 2013. She said 1 live birth at age 66.  Fertility Discussion:not applicable Prior History of Cancer:no  Health Maintenance:  Colonoscopy2013 Bone Density2012 Last PAP smear2013  ECOG PERFORMANCE STATUS: 0 - Asymptomatic  Genetic Counseling/testing:no  REVIEW OF SYSTEMS: 14 point comprehensive review of systems was obtained and it is scant separately into the electronic medical record  PHYSICAL EXAMINATION: Blood pressure 144/89, pulse 63, temperature 98.1 F (36.7 C), temperature source Oral, resp. rate 20, height 5' 4.5" (1.638 m), weight 137 lb 8 oz (62.37 kg).  Well-developed well-nourished female in no acute distress HEENT exam EOMI PERRLA sclerae anicteric no conjunctival pallor oral mucosa is moist neck is supple lungs are clear cardiovascular regular rate rhythm abdomen is soft nontender no HSM extremities no edema neuro is nonfocal Right breast no palpable lesions no masses or nipple discharge left breast no masses or nipple discharge    STUDIES/RESULTS: Mr Breast Bilateral W Wo Contrast  05/31/2013   *RADIOLOGY REPORT*  Clinical Data: new diagnosis of invasive and in situ mammary carcinoma right upper outer quadrant  BILATERAL BREAST MRI WITH AND WITHOUT CONTRAST  Technique: Multiplanar, multisequence MR images of both breasts were  obtained prior to and following the intravenous administration of 12ml of Multihance.  THREE-DIMENSIONAL MR IMAGE RENDERING ON INDEPENDENT WORKSTATION:  Three-dimensional MR images were rendered by post-processing of the original MR data on an independent workstation.  The three- dimensional MR images were interpreted, and findings are reported in the following complete MRI report for this study.  Comparison:   Recent imaging examinations.  FINDINGS:  Breast Composition:  b.  Scattered fibroglandular tissue  Background parenchymal enhancement: Moderate  Right breast:  Enhancing biopsy tract upper outer quadrant associated with marker clip.  Just cranial to this  is a 1.5cm post- biopsy hematoma with mild rim enhancement and internal small fluid- fluid level.  Left breast:   No mass or abnormal enhancement.  Lymph nodes:   No abnormal appearing lymph nodes.  Ancillary findings:  None.  IMPRESSION: No suspicious mass or enhancement.  Anticipated post-biopsy findings identified inthe right upper outer quadrant.  BI-RADS CATEGORY 6:  Known biopsy-proven malignancy - appropriate action should be taken.   Original Report Authenticated By: Esperanza Heir, M.D.   Nm Sentinel Node Inj-no Rpt (breast)  06/11/2013   CLINICAL DATA: right breast cancer   Sulfur colloid was injected intradermally by the nuclear medicine  technologist for breast cancer sentinel node localization.    Mm Rt Plc Breast Loc Dev   1st Lesion  Inc Mammo Guide  06/11/2013   *RADIOLOGY REPORT*  Clinical Data: Right breast cancer for surgery  NEEDLE LOCALIZATION WITH MAMMOGRAPHIC GUIDANCE AND SPECIMEN RADIOGRAPH  Comparison: Previous exam(s)  Patient presents for needle localization prior to right breast surgery. I met with the patient and we discussed the procedure of needle localization including benefits and alternatives. We discussed the high likelihood of a successful procedure. We discussed the risks of the procedure, including infection,  bleeding, tissue injury, and further surgery. Informed, written consent was given. The usual time-out protocol was performed immediately prior to the procedure.  Using mammographic guidance, sterile technique, 2% lidocaine and a 7 cm modified Kopans needle, biopsy clip and calcifications localized using a lateral approach. The films are marked for Dr. Magnus Ivan.  Specimen radiograph confirms biopsy clip, calcifications, wire present in the tissue sample. The specimen is marked for pathology.  IMPRESSION: Needle localization right breast breast. No apparent complications.   Original Report Authenticated By: Sherian Rein, M.D.     LABS:    Chemistry      Component Value Date/Time   NA 140 05/29/2013 1205   K 4.7 05/29/2013 1205   CO2 27 05/29/2013 1205   BUN 19.3 05/29/2013 1205   CREATININE 0.8 05/29/2013 1205      Component Value Date/Time   CALCIUM 9.6 05/29/2013 1205   ALKPHOS 71 05/29/2013 1205   AST 18 05/29/2013 1205   ALT 15 05/29/2013 1205   BILITOT 0.67 05/29/2013 1205      Lab Results  Component Value Date   WBC 6.2 05/29/2013   HGB 14.1 05/29/2013   HCT 41.4 05/29/2013   MCV 97.9 05/29/2013   PLT 212 05/29/2013   PATHOLOGY: ADDITIONAL INFORMATION: PROGNOSTIC INDICATORS - ACIS Results: IMMUNOHISTOCHEMICAL AND MORPHOMETRIC ANALYSIS BY THE AUTOMATED CELLULAR IMAGING SYSTEM (ACIS) Estrogen Receptor: 100%, POSITIVE, STRONG STAINING INTENSITY Progesterone Receptor: 0%, NEGATIVE Proliferation Marker Ki67: 10% REFERENCE RANGE ESTROGEN RECEPTOR NEGATIVE <1% POSITIVE =>1% PROGESTERONE RECEPTOR NEGATIVE <1% POSITIVE =>1% All controls stained appropriately Pecola Leisure MD Pathologist, Electronic Signature ( Signed 05/27/2013) CHROMOGENIC IN-SITU HYBRIDIZATION Results: HER-2/NEU BY CISH - NO AMPLIFICATION OF HER-2 DETECTED. RESULT RATIO OF HER2: CEP 17 SIGNALS 1.08 AVERAGE HER2 COPY NUMBER PER CELL 1.30 REFERENCE RANGE 1 of 3 Duplicate copy FINAL for [name], [name]  ([id number]) ADDITIONAL INFORMATION:(continued) NEGATIVE HER2/Chr17 Ratio <2.0 and Average HER2 copy number <4.0 EQUIVOCAL HER2/Chr17 Ratio <2.0 and Average HER2 copy number 4.0 and <6.0 POSITIVE HER2/Chr17 Ratio >=2.0 and/or Average HER2 copy number >=6.0 Pecola Leisure MD Pathologist, Electronic Signature ( Signed 05/23/2013) FINAL DIAGNOSIS Diagnosis Breast, right, needle core biopsy - INVASIVE LOBULAR CARCINOMA. - LOBULAR CARCINOMA IN SITU. - PORTION OF ONE BENIGN LYMPH NODE (0/1). - SEE COMMENT. Microscopic Comment The carcinoma  appears grade I and is positive for cytokeratin and negative for e-cadherin, supporting a lobular phenotype. A breast prognostic profile will be performed and the results reported separately. The results were called to [facility] on 05/21/2013. (RM:ecj 05/21/2013) Pecola Leisure MD Pathologist, Electronic Signature (Case signed 05/22/2013)  ASSESSMENT    67 year old female with  #1 new diagnosis of 1.2 cm invasive lobular carcinoma with LCIS of the right breast found on a screening mammogram that it is calcifications. Patient's tumor is ER positive PR negative HER-2/neu negative with a Ki-67 of 10%. Patient's case was discussed at the most of his right breast conference. She is seen in the multidisciplinary breast clinic. She is an excellent candidate for a lumpectomy. However she does need an MRI of the breasts.  #2 we discussed lumpectomy with sentinel lymph node biopsy followed by radiation. I do not feel that she would benefit from chemotherapy. I did recommend antiestrogen therapy with tamoxifen. We discussed rationale for tamoxifen systemically. Her treatment is curative intent. We discussed side effects of treatment with tamoxifen risks benefits and length of therapy for 5-10 years  Clinical Trial Eligibility: no Multidisciplinary conference discussion yes     PLAN:    #1 proceed with lumpectomy and sentinel lymph node biopsy after  MRI of the breasts is performed.  #2 she will return in 4-5 weeks time in followup to discuss adjuvant systemic therapy.  #3 she will also need to follow with Dr. Chipper Herb        Discussion: Patient is being treated per NCCN breast cancer care guidelines appropriate for stage.I   Thank you so much for allowing me to participate in the care of [name]. I will continue to follow up the patient with you and assist in her care.  All questions were answered. The patient knows to call the clinic with any problems, questions or concerns. We can certainly see the patient much sooner if necessary.  I spent 40 minutes counseling the patient face to face. The total time spent in the appointment was 55 minutes.  Drue Second, MD Medical/Oncology [facility] [phone] (beeper) [phone] (Office)  06/19/2013, 9:53 PM

## 2013-06-21 ENCOUNTER — Encounter (INDEPENDENT_AMBULATORY_CARE_PROVIDER_SITE_OTHER): Payer: Self-pay | Admitting: General Surgery

## 2013-06-21 ENCOUNTER — Telehealth (INDEPENDENT_AMBULATORY_CARE_PROVIDER_SITE_OTHER): Payer: Self-pay

## 2013-06-21 ENCOUNTER — Ambulatory Visit (INDEPENDENT_AMBULATORY_CARE_PROVIDER_SITE_OTHER): Payer: Medicare Other | Admitting: General Surgery

## 2013-06-21 VITALS — BP 120/84 | HR 60 | Temp 97.7°F | Resp 12 | Ht 65.5 in | Wt 136.0 lb

## 2013-06-21 DIAGNOSIS — T888XXD Other specified complications of surgical and medical care, not elsewhere classified, subsequent encounter: Secondary | ICD-10-CM

## 2013-06-21 DIAGNOSIS — Z5189 Encounter for other specified aftercare: Secondary | ICD-10-CM

## 2013-06-21 NOTE — Progress Notes (Signed)
History: Patient is approximately 2 weeks following right breast lumpectomy. She developed swelling and bruising on the evening of surgery. I actually had seen her in the emergency room at that time with a fairly large hematoma in the breast but no active bleeding and I did not feel that surgical evacuation was required. Since then she has been wearing a binder and using heat. She comes to the urgent office today concerned that it is not feeling better. It is not however feeling any worse or gotten any larger by her estimation.  Exam: The right breast is significantly swollen but not tense and there is marked bruising of the breast and down onto the abdominal wall. The incision is healing without evidence of infection. I do not feel a large discrete hematoma at the incision but more likely blood has dissected into the tissues of the breast and subcutaneous tissue. This certainly is not any larger than it was over one week ago.  Assessment and plan: Postoperative breast hematoma post lobectomy. This is stable without evidence of infection. I would not recommend any specific intervention. She is given instructions to call for any worsening symptoms and has a scheduled followup appointment in approximately 10 days.

## 2013-06-21 NOTE — Telephone Encounter (Signed)
Pt called concerned because she has large hematoma of breast that does not seem to be resolving and large amount of bruising down side and to groin. Pt going out of town and would like area checked. No fever.No redness. Increase in swelling stopped several days ago. Pt given appt today with our urgent office to have area evaluated.

## 2013-06-21 NOTE — Patient Instructions (Signed)
Please call as needed for any worsening symptoms or concerns. Otherwise keep her regular appointment.

## 2013-06-25 ENCOUNTER — Ambulatory Visit: Payer: Medicare Other

## 2013-06-25 ENCOUNTER — Institutional Professional Consult (permissible substitution): Payer: Medicare Other | Admitting: Radiation Oncology

## 2013-06-26 ENCOUNTER — Other Ambulatory Visit: Payer: Self-pay

## 2013-06-27 ENCOUNTER — Ambulatory Visit: Payer: Medicare Other | Admitting: Oncology

## 2013-07-02 ENCOUNTER — Ambulatory Visit (INDEPENDENT_AMBULATORY_CARE_PROVIDER_SITE_OTHER): Payer: Medicare Other | Admitting: Surgery

## 2013-07-02 ENCOUNTER — Encounter (INDEPENDENT_AMBULATORY_CARE_PROVIDER_SITE_OTHER): Payer: Self-pay | Admitting: Surgery

## 2013-07-02 VITALS — BP 120/84 | HR 63 | Temp 96.2°F | Resp 12 | Ht 65.5 in | Wt 137.6 lb

## 2013-07-02 DIAGNOSIS — Z09 Encounter for follow-up examination after completed treatment for conditions other than malignant neoplasm: Secondary | ICD-10-CM

## 2013-07-02 NOTE — Progress Notes (Signed)
Subjective:     Patient ID: [name], female   DOB: 16-Aug-1946, 67 y.o.   MRN: [id number]  HPI She is here for her postoperative visit status post right breast lumpectomy and sentinel lymph node biopsy. Unfortunately, she developed a postoperative hematoma which was quite large. She reports it is improving with heating pads. She has mild-to-moderate discomfort  Review of Systems     Objective:   Physical Exam On exam, there is a large hematoma. I inserted a 16-gauge needle but was unable to drain any of the hematoma.  Fortunately, the final pathology showed all margins were negative. There was no further invasive disease identified. The sentinel lymph nodes were also negative    Assessment:     Patient stable postop, hematoma     Plan:     I will see her back in one week to see if the hematoma can be drained again. I did discuss surgical removal of the hematoma but she would like to proceed conservatively  Which I feel is very reasonable. She will resume her aspirin. I will see her back next week

## 2013-07-04 ENCOUNTER — Telehealth (INDEPENDENT_AMBULATORY_CARE_PROVIDER_SITE_OTHER): Payer: Self-pay

## 2013-07-04 NOTE — Telephone Encounter (Signed)
Patient called back to office and states she would like to change her appointment to 07/09/13 w/Dr. Magnus Ivan.  Patient states she spoke to Dr. Eliberto Ivory assistant earlier and decided not to change her date.  Patient sates her prior plans have changed and she would like to take the appointment on 07/09/13 w/Dr. Magnus Ivan.  Appt changed to 07/09/13 @ 12:00 w/Dr. Magnus Ivan, with arrival time of 11:40 am.

## 2013-07-05 ENCOUNTER — Ambulatory Visit (HOSPITAL_BASED_OUTPATIENT_CLINIC_OR_DEPARTMENT_OTHER): Payer: Medicare Other | Admitting: Oncology

## 2013-07-05 ENCOUNTER — Telehealth: Payer: Self-pay | Admitting: Oncology

## 2013-07-05 ENCOUNTER — Encounter: Payer: Self-pay | Admitting: Oncology

## 2013-07-05 VITALS — BP 124/78 | HR 58 | Temp 98.4°F | Resp 20 | Ht 65.5 in | Wt 137.0 lb

## 2013-07-05 DIAGNOSIS — C50419 Malignant neoplasm of upper-outer quadrant of unspecified female breast: Secondary | ICD-10-CM

## 2013-07-05 DIAGNOSIS — Z170 Estrogen receptor positive status [ER+]: Secondary | ICD-10-CM

## 2013-07-05 DIAGNOSIS — C50411 Malignant neoplasm of upper-outer quadrant of right female breast: Secondary | ICD-10-CM

## 2013-07-05 NOTE — Telephone Encounter (Signed)
, °

## 2013-07-05 NOTE — Progress Notes (Signed)
OFFICE PROGRESS NOTE  CC**  Michelle Hillier, MD [address] [place] [address] Dr. Abigail Miyamoto  Dr. Chipper Herb  DIAGNOSIS: 67 year old female with T1 N0 invasive lobular carcinoma/LCIS of the right breast. Patient is seen in the multidisciplinary breast clinic    STAGE:  Breast cancer of upper-outer quadrant of right female breast  Primary site: Breast (Right)  Staging method: AJCC 7th Edition  Clinical: Stage IA (T1c, N0, cM0)  Summary: Stage IA (T1c, N0, cM0)  PRIOR THERAPY:  #1screening mammogram on 04/23/2013 was found to have a suspicious calcifications in the right breast. 05/10/2013 patient had additional views performed that showed suspicious calcifications within the upper outer quadrant. She had a stereotactic core biopsy performed on 05/20/2013. The pathology revealed invasive lobular carcinoma with LCIS. Lymph node was benign. She had tumor that was ER positive PR negative with a Ki-67 of 10%.  #2 patient is status post right lumpectomy with sentinel lymph node biopsy performed on 06/11/2013. Her final pathology revealed focal lobular carcinoma in situ. No invasive disease was found. Sentinel nodes were negative for metastatic disease. Postoperatively she is doing well.  #3 patient to be seen by radiation oncology for consideration of postlumpectomy radiation therapy for invasive lobular carcinoma.  CURRENT THERAPY: Refer to radiation  INTERVAL HISTORY: [name] 67 y.o. female returns for followup visit post lumpectomy. Clinically she seems to be doing well and is without any complaints. Her surgical site is healing well. She denies any nausea vomiting fevers chills night sweats headaches shortness of breath chest pains palpitations. Remainder of the 10 point review of systems is negative.  MEDICAL HISTORY: Past Medical History  Diagnosis Date  . Diverticulosis   . Adenomatous colon polyp   . Arthritis   . Allergic rhinitis   . HLD  (hyperlipidemia)   . IBS (irritable bowel syndrome)   . Breast cancer   . Graves' disease     2011    ALLERGIES:  is allergic to amoxicillin and terak.  MEDICATIONS:  Current Outpatient Prescriptions  Medication Sig Dispense Refill  . aspirin EC 81 MG tablet Take 81 mg by mouth every morning.       Marland Kitchen atorvastatin (LIPITOR) 10 MG tablet Take 10 mg by mouth every evening.       Marland Kitchen atorvastatin (LIPITOR) 10 MG tablet       . buPROPion (WELLBUTRIN SR) 150 MG 12 hr tablet Take 150 mg by mouth every morning.       . celecoxib (CELEBREX) 200 MG capsule Take 200 mg by mouth every morning.       . diclofenac sodium (VOLTAREN) 1 % GEL Apply 2 application topically daily as needed (Knee pain).      . Echinacea 125 MG CAPS Take 125 mg by mouth as needed (Cold prophylaxis).       . fluticasone (FLONASE) 50 MCG/ACT nasal spray Place 2 sprays into the nose daily.      Marland Kitchen GLUCOSAMINE-CHONDROITIN PO Take 2 tablets by mouth every morning.       Marland Kitchen ibuprofen (ADVIL) 200 MG tablet Take 400 mg by mouth every 6 (six) hours as needed for pain.       Marland Kitchen levothyroxine (SYNTHROID, LEVOTHROID) 100 MCG tablet Take 100 mcg by mouth every other day.      . levothyroxine (SYNTHROID, LEVOTHROID) 88 MCG tablet Take 88 mcg by mouth daily.      Marland Kitchen MAGNESIUM PO Take 1 tablet by mouth at bedtime.      Marland Kitchen  Melatonin 5 MG TABS Take 5 mg by mouth at bedtime.      . Probiotic Product (PROBIOTIC DAILY PO) Take 1 tablet by mouth daily.      Marland Kitchen SYNTHROID 100 MCG tablet       . SYNTHROID 88 MCG tablet       . Zolpidem Tartrate (AMBIEN PO) Take 1 tablet by mouth at bedtime as needed (Sleep).        No current facility-administered medications for this visit.    SURGICAL HISTORY:  Past Surgical History  Procedure Laterality Date  . Colonoscopy w/ biopsies and polypectomy  multiple  . Breast biopsy    . Knee arthroscopy      Left Knee  . Breast lumpectomy      06/11/13  with lymph node biopsy  . Breast lumpectomy with needle  localization and axillary sentinel lymph node bx Right 06/11/2013    Procedure: BREAST LUMPECTOMY WITH NEEDLE LOCALIZATION AND AXILLARY SENTINEL LYMPH NODE BX;  Surgeon: Shelly Rubenstein, MD;  Location: [facility] OR;  Service: General;  Laterality: Right;  Needle localization BCG 7:30 nuc med 9:30      REVIEW OF SYSTEMS:  Pertinent items are noted in HPI.   HEALTH MAINTENANCE:    PHYSICAL EXAMINATION: Blood pressure 124/78, pulse 58, temperature 98.4 F (36.9 C), temperature source Oral, resp. rate 20, height 5' 5.5" (1.664 m), weight 137 lb (62.143 kg). Body mass index is 22.44 kg/(m^2). ECOG PERFORMANCE STATUS: 0 - Asymptomatic   General appearance: alert, cooperative and appears stated age Resp: clear to auscultation bilaterally Cardio: regular rate and rhythm GI: soft, non-tender; bowel sounds normal; no masses,  no organomegaly Extremities: extremities normal, atraumatic, no cyanosis or edema Neurologic: Grossly normal Right breast: Surgical site looks healed. Left breast no masses or nipple discharge  LABORATORY DATA: Lab Results  Component Value Date   WBC 6.2 05/29/2013   HGB 14.1 05/29/2013   HCT 41.4 05/29/2013   MCV 97.9 05/29/2013   PLT 212 05/29/2013      Chemistry      Component Value Date/Time   NA 140 05/29/2013 1205   K 4.7 05/29/2013 1205   CO2 27 05/29/2013 1205   BUN 19.3 05/29/2013 1205   CREATININE 0.8 05/29/2013 1205      Component Value Date/Time   CALCIUM 9.6 05/29/2013 1205   ALKPHOS 71 05/29/2013 1205   AST 18 05/29/2013 1205   ALT 15 05/29/2013 1205   BILITOT 0.67 05/29/2013 1205    Diagnosis 1. Breast, lumpectomy, Right - BREAST PARENCHYMA WITH FOCAL LOBULAR CARCINOMA IN SITU. - BIOPSY SITE CHANGES WITH FIBROSIS AND SCATTERED CHRONIC INFLAMMATION. - BENIGN DUCTS WITH MICROCALCIFICATIONS PRESENT. - ONE BENIGN LYMPH NODE WITH NO TUMOR SEEN (0/1). - SEE COMMENT. 2. Lymph node, sentinel, biopsy, Right axillary #1 - ONE BENIGN LYMPH NODE WITH NO TUMOR SEEN (0/1). - SEE  COMMENT. 3. Lymph node, sentinel, biopsy, Right axillary #2 - ONE BENIGN LYMPH NODE WITH NO TUMOR SEEN (0/1). - SEE COMMENT. Microscopic Comment 1. Immunohistochemical stains are performed. A cytokeratin AE1/AE3 is performed on two blocks (blocks 1C and 1D). The stain fails to highlight evidence of invasive carcinoma. An E-cadherin and cytokeratin 5/6 is also performed on a single block (block 1D). The E-cadherin stain is negative in the focus of epithelial proliferation while the cytokeratin 5/6 stain highlights an intact myoepithelial layer. The staining pattern coupled with the morphology is consistent with focal lobular carcinoma in situ. The entire hematoma and surrounding tissue is  grossly submitted and histologically examined. Given that the lumpectomy specimen is from the previous biopsy site, the tumor is best staged as pTis (LCIS) pN0, pMX. As invasive tumor is not identified, a formal oncology staging template will not be issued. Dr Colonel Bald has seen the first specimen in consultation with essential agreement of the above diagnosis. 2. , 3. Immunohistochemical stains for cytokeratin AE1/AE3 are performed on both lymph nodes. The stains are negative, confirming the lack of metastatic carcinoma. (RH:ecj 06/14/2013) Zandra Abts MD Pathologist, Electronic Signature (Case signed 06/14/2013)   RADIOGRAPHIC STUDIES:  Nm Sentinel Node Inj-no Rpt (breast)  06/11/2013   CLINICAL DATA: right breast cancer   Sulfur colloid was injected intradermally by the nuclear medicine  technologist for breast cancer sentinel node localization.    Mm Rt Plc Breast Loc Dev   1st Lesion  Inc Mammo Guide  06/11/2013   *RADIOLOGY REPORT*  Clinical Data: Right breast cancer for surgery  NEEDLE LOCALIZATION WITH MAMMOGRAPHIC GUIDANCE AND SPECIMEN RADIOGRAPH  Comparison: Previous exam(s)  Patient presents for needle localization prior to right breast surgery. I met with the patient and we discussed the procedure  of needle localization including benefits and alternatives. We discussed the high likelihood of a successful procedure. We discussed the risks of the procedure, including infection, bleeding, tissue injury, and further surgery. Informed, written consent was given. The usual time-out protocol was performed immediately prior to the procedure.  Using mammographic guidance, sterile technique, 2% lidocaine and a 7 cm modified Kopans needle, biopsy clip and calcifications localized using a lateral approach. The films are marked for Dr. Magnus Ivan.  Specimen radiograph confirms biopsy clip, calcifications, wire present in the tissue sample. The specimen is marked for pathology.  IMPRESSION: Needle localization right breast breast. No apparent complications.   Original Report Authenticated By: Sherian Rein, M.D.    ASSESSMENT: 68 year old female with  #1 new diagnosis of invasive lobular carcinoma of the right breast with lobular carcinoma in situ. Tumor was ER positive PR positive. She was seen at the multidisciplinary breast clinic. She is now status post right lumpectomy with sentinel lymph node biopsy. The final pathology only revealed lobular carcinoma in situ. Postoperatively doing well.  #2 patient not to be referred to radiation oncology for consideration of postlumpectomy radiation. She was originally seen by Dr. Chipper Herb.  #3 once she completes radiation therapy I will plan on giving her antiestrogen therapy with either tamoxifen or an aromatase inhibitor such as Arimidex. Risks and benefits of this were discussed with the patient   PLAN:   #1 proceed with referral to radiation.  #2 I will see her back after completion of radiation therapy   All questions were answered. The patient knows to call the clinic with any problems, questions or concerns. We can certainly see the patient much sooner if necessary.  I spent 25 minutes counseling the patient face to face. The total time spent in the  appointment was 30 minutes.    Drue Second, MD Medical/Oncology [facility] [phone] (beeper) [phone] (Office)  07/05/2013, 3:44 PM

## 2013-07-09 ENCOUNTER — Ambulatory Visit (INDEPENDENT_AMBULATORY_CARE_PROVIDER_SITE_OTHER): Payer: Medicare Other | Admitting: Surgery

## 2013-07-09 ENCOUNTER — Encounter (INDEPENDENT_AMBULATORY_CARE_PROVIDER_SITE_OTHER): Payer: Self-pay | Admitting: Surgery

## 2013-07-09 VITALS — BP 138/82 | HR 60 | Temp 97.0°F | Resp 16 | Ht 65.5 in | Wt 136.0 lb

## 2013-07-09 DIAGNOSIS — Z09 Encounter for follow-up examination after completed treatment for conditions other than malignant neoplasm: Secondary | ICD-10-CM

## 2013-07-09 NOTE — Progress Notes (Signed)
Subjective:     Patient ID: [name], female   DOB: 15-Jul-1946, 67 y.o.   MRN: [id number]  HPI Since I saw her last, she has been doing warm compresses on her breast self hematoma.  Review of Systems     Objective:   Physical Exam  On exam, her breast is much softer.I inserted a 16-gauge needle but was only a draining minimal fluid    Assessment:     Postop right breast hematoma     Plan:     I will see her back next week. If I am not able to aspirate any further fluid I may consider making a small incision in the breast to drain the rest of the hematoma.

## 2013-07-10 ENCOUNTER — Encounter: Payer: Self-pay | Admitting: Radiation Oncology

## 2013-07-10 DIAGNOSIS — C50919 Malignant neoplasm of unspecified site of unspecified female breast: Secondary | ICD-10-CM | POA: Insufficient documentation

## 2013-07-10 NOTE — Progress Notes (Addendum)
Location of Breast Cancer: right upper outer  Histology per Pathology Report:  05/20/13 Diagnosis 1. Breast, lumpectomy, Right - BREAST PARENCHYMA WITH FOCAL LOBULAR CARCINOMA IN SITU. - BIOPSY SITE CHANGES WITH FIBROSIS AND SCATTERED CHRONIC INFLAMMATION. - BENIGN DUCTS WITH MICROCALCIFICATIONS PRESENT. - ONE BENIGN LYMPH NODE WITH NO TUMOR SEEN (0/1). - SEE COMMENT. 2. Lymph node, sentinel, biopsy, Right axillary #1 - ONE BENIGN LYMPH NODE WITH NO TUMOR SEEN (0/1). - SEE COMMENT. 3. Lymph node, sentinel, biopsy, Right axillary #2 - ONE BENIGN LYMPH NODE WITH NO TUMOR SEEN (0/1). - SEE COMMENT.  Receptor Status: ER(100%), PR (0%), Her2-neu (-)  Did patient present with symptoms (if so, please note symptoms) or was this found on screening mammography?: screening mammogram  Past/Anticipated interventions by surgeon, if any: right lumpectomy, SN bx  Past/Anticipated interventions by medical oncology, if any: Chemotherapy : antiestrogen therapy  Lymphedema issues, if any:  no  Pain issues, if any:  Pain, tenderness of right breast post op, hematoma, seroma , some residual bruising, and area "hard and swollen", pt states "less painful in past 2 days", Dr Magnus Ivan unable to aspirate x 2  SAFETY ISSUES:  Prior radiation? no  Pacemaker/ICD? No  Possible current pregnancy? no  Is the patient on methotrexate? 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

## 2013-07-11 ENCOUNTER — Encounter: Payer: Self-pay | Admitting: Radiation Oncology

## 2013-07-11 ENCOUNTER — Ambulatory Visit
Admission: RE | Admit: 2013-07-11 | Discharge: 2013-07-11 | Disposition: A | Discharge status: Home / Self Care | Payer: Medicare Other | Source: Ambulatory Visit | Attending: Radiation Oncology | Admitting: Radiation Oncology

## 2013-07-11 VITALS — BP 119/79 | HR 50 | Temp 97.9°F | Resp 20 | Wt 134.8 lb

## 2013-07-11 DIAGNOSIS — Z170 Estrogen receptor positive status [ER+]: Secondary | ICD-10-CM | POA: Insufficient documentation

## 2013-07-11 DIAGNOSIS — C50411 Malignant neoplasm of upper-outer quadrant of right female breast: Secondary | ICD-10-CM

## 2013-07-11 DIAGNOSIS — Y838 Other surgical procedures as the cause of abnormal reaction of the patient, or of later complication, without mention of misadventure at the time of the procedure: Secondary | ICD-10-CM | POA: Insufficient documentation

## 2013-07-11 DIAGNOSIS — C50419 Malignant neoplasm of upper-outer quadrant of unspecified female breast: Secondary | ICD-10-CM | POA: Diagnosis not present

## 2013-07-11 DIAGNOSIS — IMO0002 Reserved for concepts with insufficient information to code with codable children: Secondary | ICD-10-CM | POA: Diagnosis not present

## 2013-07-11 DIAGNOSIS — C50919 Malignant neoplasm of unspecified site of unspecified female breast: Secondary | ICD-10-CM | POA: Insufficient documentation

## 2013-07-11 NOTE — Progress Notes (Signed)
CC: Dr. Abigail Miyamoto  Diagnosis: Stage I (T1a, N0, M0) invasive lobular/LCIS of the right breast  History: [name] is seen today for review and scheduling of her radiation therapy in the management of her T1a N0 invasive lobular carcinoma of the right breast. At the time of a screening [facility] on 04[facility]23/2013 she was found to have suspicious calcifications within the right breast. Additional views on 05/10/2013 showed suspicious calcifications within the upper-outer quadrant. She underwent a stereotactic core biopsy on 05/20/2013 which was diagnostic for invasive lobular carcinoma along with LCIS. There was also a portion of one benign lymph node seen. The invasive lobular carcinoma was ER positive and PR negative with a Ki-67 of 10%. Breast MR on 05/31/2013 showed post biopsy changes within the upper-outer quadrant of the right breast with no suspicious masses seen. She went on to have a right partial mastectomy and sentinel lymph node biopsy by Dr. Abigail Miyamoto on 06/11/2013. There was no residual carcinoma, only focal LCIS seen within the partial mastectomy specimen. 2 sentinel lymph nodes were free of metastatic disease. I spoke with the pathologist, Dr. Larose Hires today and he tells me that the initial diagnosis on needle biopsy of invasive lobular carcinoma stands, and this was reviewed by Dr. Colonel Bald. Postoperatively, she developed a large hematoma which may be drained by Dr. Magnus Ivan and near future. She is now having less discomfort.  Physical examination: Alert and oriented.  Filed Vitals:   07/11/13 1301  BP: 119/79  Pulse: 50  Temp: 97.9 F (36.6 C)  Resp: 20   Head neck examination: Grossly unremarkable. Nodes: Without palpable cervical, supraclavicular, or axillary lymphadenopathy. Chest: Lungs clear. Breasts: There is a large hematoma within the right breast. Right upper quadrant partial mastectomy wound is well-healed. Left breast without masses or  lesions. Abdomen: Without hepatomegaly. Extremities: Without edema.  Laboratory data:  Lab Results  Component Value Date   WBC 6.2 05/29/2013   HGB 14.1 05/29/2013   HCT 41.4 05/29/2013   MCV 97.9 05/29/2013   PLT 212 05/29/2013   Impression: Stage (T1a N0 M0) invasive lobular/LCIS of the right breast. I reviewed her pathology with Dr.Hilliard. The original diagnosis of invasive lobular carcinoma is confirmed. We discussed the rationale for radiation therapy following conservative surgery. We discussed the potential acute and late toxicities of radiation therapy. I would ordinarily obtain a mammogram to confirm removal of all suspicious microcalcifications, but this is not reasonable considering her large hematoma which may be drained in the near future. I feel that she would be a candidate for hypo-fractionated radiation therapy. We can comfortably wait up until 2 months following initial surgery for the start of radiation therapy. I will have her return for simulation/treatment planning the week of September 15. Consent is signed today.  Plan: As discussed above.  30 minutes was spent face-to-face with the patient, primarily counseling the patient and coordinating her care.

## 2013-07-11 NOTE — Progress Notes (Signed)
Please see the Nurse Progress Note in the MD Initial Consult Encounter for this patient. 

## 2013-07-12 ENCOUNTER — Encounter (INDEPENDENT_AMBULATORY_CARE_PROVIDER_SITE_OTHER): Payer: Medicare Other | Admitting: Surgery

## 2013-07-16 ENCOUNTER — Encounter (INDEPENDENT_AMBULATORY_CARE_PROVIDER_SITE_OTHER): Payer: Self-pay | Admitting: Surgery

## 2013-07-16 ENCOUNTER — Institutional Professional Consult (permissible substitution): Payer: Medicare Other | Admitting: Radiation Oncology

## 2013-07-16 ENCOUNTER — Ambulatory Visit (INDEPENDENT_AMBULATORY_CARE_PROVIDER_SITE_OTHER): Payer: Medicare Other | Admitting: Surgery

## 2013-07-16 ENCOUNTER — Other Ambulatory Visit (INDEPENDENT_AMBULATORY_CARE_PROVIDER_SITE_OTHER): Payer: Self-pay | Admitting: Surgery

## 2013-07-16 DIAGNOSIS — Z09 Encounter for follow-up examination after completed treatment for conditions other than malignant neoplasm: Secondary | ICD-10-CM

## 2013-07-16 DIAGNOSIS — S2001XA Contusion of right breast, initial encounter: Secondary | ICD-10-CM

## 2013-07-16 NOTE — Progress Notes (Signed)
Subjective:     Patient ID: [name], female   DOB: 04-04-1946, 67 y.o.   MRN: [id number]  HPI She is here for another check of the right breast hematoma. She has returned to normal activity he reports almost no discomfort.  Review of Systems     Objective:   Physical Exam On exam, the breast is very soft. There is still discoloration from the previous hematoma. I inserted an 18-gauge needle and was only able to aspirate 5 cc of liquefied hematoma    Assessment:     Postop right breast hematoma     Plan:     We need to make sure this is resolving before starting radiation. I am going to order an ultrasound of the right breast to be done at the breast center to see him much fluid as present and to see whether an aspiration can be performed by the radiologist if necessary. I will see her back in one week

## 2013-07-17 ENCOUNTER — Ambulatory Visit
Admission: RE | Admit: 2013-07-17 | Discharge: 2013-07-17 | Disposition: A | Discharge status: Home / Self Care | Payer: Medicare Other | Source: Ambulatory Visit | Attending: Surgery | Admitting: Surgery

## 2013-07-17 ENCOUNTER — Other Ambulatory Visit (INDEPENDENT_AMBULATORY_CARE_PROVIDER_SITE_OTHER): Payer: Self-pay | Admitting: Surgery

## 2013-07-17 DIAGNOSIS — N61 Mastitis without abscess: Secondary | ICD-10-CM | POA: Diagnosis not present

## 2013-07-17 DIAGNOSIS — S2001XA Contusion of right breast, initial encounter: Secondary | ICD-10-CM

## 2013-07-25 ENCOUNTER — Ambulatory Visit (INDEPENDENT_AMBULATORY_CARE_PROVIDER_SITE_OTHER): Payer: Medicare Other | Admitting: Surgery

## 2013-07-25 ENCOUNTER — Encounter (INDEPENDENT_AMBULATORY_CARE_PROVIDER_SITE_OTHER): Payer: Self-pay | Admitting: Surgery

## 2013-07-25 VITALS — BP 110/62 | HR 56 | Temp 98.6°F | Resp 14 | Ht 65.5 in | Wt 137.8 lb

## 2013-07-25 DIAGNOSIS — Z09 Encounter for follow-up examination after completed treatment for conditions other than malignant neoplasm: Secondary | ICD-10-CM

## 2013-07-25 NOTE — Progress Notes (Signed)
Subjective:     Patient ID: [name], female   DOB: 1946/02/18, 67 y.o.   MRN: [id number]  HPI She is here for another visit. She has absolutely no complaints and feels like the breast is back to normal. She had an ultrasound and an attempted aspiration which was unsuccessful. There is a multiloculated collection in the breast which is about 6 cm size  Review of Systems     Objective:   Physical Exam On exam, the breast is normal to palpation    Assessment:     Resolving hematoma of the right breast     Plan:     I believe it is okay to go ahead and proceed with radiation. I plan on seeing her back in 3 months unless there are problems

## 2013-08-06 ENCOUNTER — Ambulatory Visit
Admission: RE | Admit: 2013-08-06 | Discharge: 2013-08-06 | Disposition: A | Discharge status: Home / Self Care | Payer: Medicare Other | Source: Ambulatory Visit | Attending: Radiation Oncology | Admitting: Radiation Oncology

## 2013-08-06 DIAGNOSIS — C50419 Malignant neoplasm of upper-outer quadrant of unspecified female breast: Secondary | ICD-10-CM | POA: Diagnosis not present

## 2013-08-06 DIAGNOSIS — C50919 Malignant neoplasm of unspecified site of unspecified female breast: Secondary | ICD-10-CM | POA: Diagnosis not present

## 2013-08-06 DIAGNOSIS — C50411 Malignant neoplasm of upper-outer quadrant of right female breast: Secondary | ICD-10-CM

## 2013-08-06 DIAGNOSIS — Z510 Encounter for antineoplastic radiation therapy: Secondary | ICD-10-CM | POA: Diagnosis not present

## 2013-08-06 NOTE — Progress Notes (Signed)
Complex simulation/treatment planning note: The patient was taken to the CT simulator. She was placed on a custom breast board and Acuform neck mold was constructed for immobilization. Her right breast field borders were marked with radiopaque wires. Her partial mastectomy scar was also marked. She was then scanned. I chose and isocenter along the central the breast. The CT data set was sent to the treatment planning system. I contoured her right breast tumor bed. She was set up to medial and lateral right breast tangents. 2 sets of multileaf collimators were designed to conform the field. I am prescribing 4250 cGy 17 sessions utilizing 6 MV photons versus mixed 6 MV/15 MV photons.

## 2013-08-08 DIAGNOSIS — C50919 Malignant neoplasm of unspecified site of unspecified female breast: Secondary | ICD-10-CM | POA: Diagnosis not present

## 2013-08-08 DIAGNOSIS — Z510 Encounter for antineoplastic radiation therapy: Secondary | ICD-10-CM | POA: Diagnosis not present

## 2013-08-08 DIAGNOSIS — C50419 Malignant neoplasm of upper-outer quadrant of unspecified female breast: Secondary | ICD-10-CM | POA: Diagnosis not present

## 2013-08-12 ENCOUNTER — Ambulatory Visit
Admission: RE | Admit: 2013-08-12 | Discharge: 2013-08-12 | Disposition: A | Discharge status: Home / Self Care | Payer: Medicare Other | Source: Ambulatory Visit | Attending: Radiation Oncology | Admitting: Radiation Oncology

## 2013-08-12 DIAGNOSIS — C50919 Malignant neoplasm of unspecified site of unspecified female breast: Secondary | ICD-10-CM | POA: Diagnosis not present

## 2013-08-12 DIAGNOSIS — C50419 Malignant neoplasm of upper-outer quadrant of unspecified female breast: Secondary | ICD-10-CM | POA: Diagnosis not present

## 2013-08-12 DIAGNOSIS — Z510 Encounter for antineoplastic radiation therapy: Secondary | ICD-10-CM | POA: Diagnosis not present

## 2013-08-12 DIAGNOSIS — C50411 Malignant neoplasm of upper-outer quadrant of right female breast: Secondary | ICD-10-CM

## 2013-08-12 NOTE — Progress Notes (Signed)
Simulation verification note: The patient underwent simulation verification for treatment to her right breast. Her isocenter is in good position and the multileaf collimators contoured the treatment volume appropriately. 

## 2013-08-13 ENCOUNTER — Ambulatory Visit
Admission: RE | Admit: 2013-08-13 | Discharge: 2013-08-13 | Disposition: A | Discharge status: Home / Self Care | Payer: Medicare Other | Source: Ambulatory Visit | Attending: Radiation Oncology | Admitting: Radiation Oncology

## 2013-08-13 DIAGNOSIS — C50419 Malignant neoplasm of upper-outer quadrant of unspecified female breast: Secondary | ICD-10-CM | POA: Diagnosis not present

## 2013-08-13 DIAGNOSIS — C50919 Malignant neoplasm of unspecified site of unspecified female breast: Secondary | ICD-10-CM | POA: Diagnosis not present

## 2013-08-13 DIAGNOSIS — Z510 Encounter for antineoplastic radiation therapy: Secondary | ICD-10-CM | POA: Diagnosis not present

## 2013-08-13 DIAGNOSIS — C50411 Malignant neoplasm of upper-outer quadrant of right female breast: Secondary | ICD-10-CM

## 2013-08-13 MED ORDER — RADIAPLEXRX EX GEL
Freq: Once | CUTANEOUS | Status: AC
  Administered 2013-08-13: 13:00:00 via TOPICAL

## 2013-08-13 MED ORDER — ALRA NON-METALLIC DEODORANT (RAD-ONC)
1.0000 "application " | Freq: Once | TOPICAL | Status: AC
  Administered 2013-08-13: 1 via TOPICAL

## 2013-08-13 NOTE — Progress Notes (Signed)
Post sim ed completed w/pt. Gave pt "Radiation and You" booklet w/all pertinent information marked and discussed, re: fatigue, skin irritation/care, nutrition, pain. Gave pt Radiaplex, Alra w/instructions for proper use. Pt verbalized understanding. All questions answered.

## 2013-08-14 ENCOUNTER — Ambulatory Visit
Admission: RE | Admit: 2013-08-14 | Discharge: 2013-08-14 | Disposition: A | Discharge status: Home / Self Care | Payer: Medicare Other | Source: Ambulatory Visit | Attending: Radiation Oncology | Admitting: Radiation Oncology

## 2013-08-14 DIAGNOSIS — C50919 Malignant neoplasm of unspecified site of unspecified female breast: Secondary | ICD-10-CM | POA: Diagnosis not present

## 2013-08-14 DIAGNOSIS — Z510 Encounter for antineoplastic radiation therapy: Secondary | ICD-10-CM | POA: Diagnosis not present

## 2013-08-15 ENCOUNTER — Ambulatory Visit
Admission: RE | Admit: 2013-08-15 | Discharge: 2013-08-15 | Disposition: A | Discharge status: Home / Self Care | Payer: Medicare Other | Source: Ambulatory Visit | Attending: Radiation Oncology | Admitting: Radiation Oncology

## 2013-08-15 DIAGNOSIS — C50919 Malignant neoplasm of unspecified site of unspecified female breast: Secondary | ICD-10-CM | POA: Diagnosis not present

## 2013-08-15 DIAGNOSIS — Z510 Encounter for antineoplastic radiation therapy: Secondary | ICD-10-CM | POA: Diagnosis not present

## 2013-08-16 ENCOUNTER — Ambulatory Visit
Admission: RE | Admit: 2013-08-16 | Discharge: 2013-08-16 | Disposition: A | Discharge status: Home / Self Care | Payer: Medicare Other | Source: Ambulatory Visit | Attending: Radiation Oncology | Admitting: Radiation Oncology

## 2013-08-16 DIAGNOSIS — Z510 Encounter for antineoplastic radiation therapy: Secondary | ICD-10-CM | POA: Diagnosis not present

## 2013-08-16 DIAGNOSIS — C50919 Malignant neoplasm of unspecified site of unspecified female breast: Secondary | ICD-10-CM | POA: Diagnosis not present

## 2013-08-19 ENCOUNTER — Ambulatory Visit
Admission: RE | Admit: 2013-08-19 | Discharge: 2013-08-19 | Disposition: A | Discharge status: Home / Self Care | Payer: Medicare Other | Source: Ambulatory Visit | Attending: Radiation Oncology | Admitting: Radiation Oncology

## 2013-08-19 ENCOUNTER — Encounter: Payer: Self-pay | Admitting: Radiation Oncology

## 2013-08-19 VITALS — BP 149/84 | HR 56 | Temp 97.7°F | Resp 20 | Wt 138.8 lb

## 2013-08-19 DIAGNOSIS — C50919 Malignant neoplasm of unspecified site of unspecified female breast: Secondary | ICD-10-CM | POA: Diagnosis not present

## 2013-08-19 DIAGNOSIS — Z510 Encounter for antineoplastic radiation therapy: Secondary | ICD-10-CM | POA: Diagnosis not present

## 2013-08-19 DIAGNOSIS — C50411 Malignant neoplasm of upper-outer quadrant of right female breast: Secondary | ICD-10-CM

## 2013-08-19 NOTE — Progress Notes (Signed)
Pt seeing dr prior to treatment due to work. She states she has occasional shooting pains in right breast, denies fatigue, loss of appetite. applying Radiaplex to right breast treatment area.

## 2013-08-19 NOTE — Progress Notes (Signed)
Weekly Management Note:  Site: Right breast Current Dose:  1000  cGy Projected Dose: 4250  cGy  Narrative: The patient is seen today for routine under treatment assessment. CBCT/MVCT images/port films were reviewed. The chart was reviewed.   No complaints today except for occasional shooting pains in the right breast with initiation of radiation therapy.. She uses Radioplex gel.  Physical Examination:  Filed Vitals:   08/19/13 0954  BP: 149/84  Pulse: 56  Temp: 97.7 F (36.5 C)  Resp: 20  .  Weight: 138 lb 12.8 oz (62.959 kg). No significant skin changes.  Impression: Tolerating radiation therapy well.  Plan: Continue radiation therapy as planned.

## 2013-08-20 ENCOUNTER — Ambulatory Visit
Admission: RE | Admit: 2013-08-20 | Discharge: 2013-08-20 | Disposition: A | Discharge status: Home / Self Care | Payer: Medicare Other | Source: Ambulatory Visit | Attending: Radiation Oncology | Admitting: Radiation Oncology

## 2013-08-20 DIAGNOSIS — Z510 Encounter for antineoplastic radiation therapy: Secondary | ICD-10-CM | POA: Diagnosis not present

## 2013-08-20 DIAGNOSIS — C50419 Malignant neoplasm of upper-outer quadrant of unspecified female breast: Secondary | ICD-10-CM | POA: Diagnosis not present

## 2013-08-20 DIAGNOSIS — C50919 Malignant neoplasm of unspecified site of unspecified female breast: Secondary | ICD-10-CM | POA: Diagnosis not present

## 2013-08-21 ENCOUNTER — Ambulatory Visit
Admission: RE | Admit: 2013-08-21 | Discharge: 2013-08-21 | Disposition: A | Discharge status: Home / Self Care | Payer: Medicare Other | Source: Ambulatory Visit | Attending: Radiation Oncology | Admitting: Radiation Oncology

## 2013-08-21 DIAGNOSIS — C50919 Malignant neoplasm of unspecified site of unspecified female breast: Secondary | ICD-10-CM | POA: Diagnosis not present

## 2013-08-21 DIAGNOSIS — Z510 Encounter for antineoplastic radiation therapy: Secondary | ICD-10-CM | POA: Diagnosis not present

## 2013-08-22 ENCOUNTER — Ambulatory Visit
Admission: RE | Admit: 2013-08-22 | Discharge: 2013-08-22 | Disposition: A | Discharge status: Home / Self Care | Payer: Medicare Other | Source: Ambulatory Visit | Attending: Radiation Oncology | Admitting: Radiation Oncology

## 2013-08-22 DIAGNOSIS — C50919 Malignant neoplasm of unspecified site of unspecified female breast: Secondary | ICD-10-CM | POA: Diagnosis not present

## 2013-08-22 DIAGNOSIS — Z510 Encounter for antineoplastic radiation therapy: Secondary | ICD-10-CM | POA: Diagnosis not present

## 2013-08-23 ENCOUNTER — Ambulatory Visit
Admission: RE | Admit: 2013-08-23 | Discharge: 2013-08-23 | Disposition: A | Discharge status: Home / Self Care | Payer: Medicare Other | Source: Ambulatory Visit | Attending: Radiation Oncology | Admitting: Radiation Oncology

## 2013-08-23 DIAGNOSIS — Z510 Encounter for antineoplastic radiation therapy: Secondary | ICD-10-CM | POA: Diagnosis not present

## 2013-08-23 DIAGNOSIS — C50919 Malignant neoplasm of unspecified site of unspecified female breast: Secondary | ICD-10-CM | POA: Diagnosis not present

## 2013-08-26 ENCOUNTER — Ambulatory Visit
Admission: RE | Admit: 2013-08-26 | Discharge: 2013-08-26 | Disposition: A | Discharge status: Home / Self Care | Payer: Medicare Other | Source: Ambulatory Visit | Attending: Radiation Oncology | Admitting: Radiation Oncology

## 2013-08-26 ENCOUNTER — Encounter: Payer: Self-pay | Admitting: Radiation Oncology

## 2013-08-26 VITALS — BP 135/86 | HR 64 | Temp 97.6°F | Resp 20 | Wt 138.8 lb

## 2013-08-26 DIAGNOSIS — Z510 Encounter for antineoplastic radiation therapy: Secondary | ICD-10-CM | POA: Diagnosis not present

## 2013-08-26 DIAGNOSIS — C50911 Malignant neoplasm of unspecified site of right female breast: Secondary | ICD-10-CM

## 2013-08-26 DIAGNOSIS — C50411 Malignant neoplasm of upper-outer quadrant of right female breast: Secondary | ICD-10-CM

## 2013-08-26 DIAGNOSIS — C50919 Malignant neoplasm of unspecified site of unspecified female breast: Secondary | ICD-10-CM | POA: Diagnosis not present

## 2013-08-26 NOTE — Progress Notes (Signed)
Weekly Management Note:  Site: Right breast Current Dose:  2500  cGy Projected Dose: 4250  cGy, no boost  Narrative: The patient is seen today for routine under treatment assessment. CBCT/MVCT images/port films were reviewed. The chart was reviewed.   She is without complaints today. She uses Radioplex gel.  Physical Examination:  Filed Vitals:   08/26/13 1445  BP: 135/86  Pulse: 64  Temp: 97.6 F (36.4 C)  Resp: 20  .  Weight: 138 lb 12.8 oz (62.959 kg). There is mild erythema the skin with no areas of desquamation.  Impression: Tolerating radiation therapy well.  Plan: Continue radiation therapy as planned.

## 2013-08-26 NOTE — Progress Notes (Signed)
Pt denies pain, does have occasional sharp shooting pains. She denies fatigue, loss of appetite. She is applying lotion to right breast treatment area, denies skin changes at this time.

## 2013-08-27 ENCOUNTER — Ambulatory Visit
Admission: RE | Admit: 2013-08-27 | Discharge: 2013-08-27 | Disposition: A | Discharge status: Home / Self Care | Payer: Medicare Other | Source: Ambulatory Visit | Attending: Radiation Oncology | Admitting: Radiation Oncology

## 2013-08-27 DIAGNOSIS — C50419 Malignant neoplasm of upper-outer quadrant of unspecified female breast: Secondary | ICD-10-CM | POA: Diagnosis not present

## 2013-08-27 DIAGNOSIS — Z510 Encounter for antineoplastic radiation therapy: Secondary | ICD-10-CM | POA: Diagnosis not present

## 2013-08-27 DIAGNOSIS — C50919 Malignant neoplasm of unspecified site of unspecified female breast: Secondary | ICD-10-CM | POA: Diagnosis not present

## 2013-08-28 ENCOUNTER — Ambulatory Visit
Admission: RE | Admit: 2013-08-28 | Discharge: 2013-08-28 | Disposition: A | Discharge status: Home / Self Care | Payer: Medicare Other | Source: Ambulatory Visit | Attending: Radiation Oncology | Admitting: Radiation Oncology

## 2013-08-28 DIAGNOSIS — Z510 Encounter for antineoplastic radiation therapy: Secondary | ICD-10-CM | POA: Diagnosis not present

## 2013-08-28 DIAGNOSIS — C50919 Malignant neoplasm of unspecified site of unspecified female breast: Secondary | ICD-10-CM | POA: Diagnosis not present

## 2013-08-29 ENCOUNTER — Ambulatory Visit
Admission: RE | Admit: 2013-08-29 | Discharge: 2013-08-29 | Disposition: A | Discharge status: Home / Self Care | Payer: Medicare Other | Source: Ambulatory Visit | Attending: Radiation Oncology | Admitting: Radiation Oncology

## 2013-08-29 DIAGNOSIS — C50919 Malignant neoplasm of unspecified site of unspecified female breast: Secondary | ICD-10-CM | POA: Diagnosis not present

## 2013-08-29 DIAGNOSIS — Z510 Encounter for antineoplastic radiation therapy: Secondary | ICD-10-CM | POA: Diagnosis not present

## 2013-08-30 ENCOUNTER — Ambulatory Visit
Admission: RE | Admit: 2013-08-30 | Discharge: 2013-08-30 | Disposition: A | Discharge status: Home / Self Care | Payer: Medicare Other | Source: Ambulatory Visit | Attending: Radiation Oncology | Admitting: Radiation Oncology

## 2013-08-30 DIAGNOSIS — C50919 Malignant neoplasm of unspecified site of unspecified female breast: Secondary | ICD-10-CM | POA: Diagnosis not present

## 2013-08-30 DIAGNOSIS — Z510 Encounter for antineoplastic radiation therapy: Secondary | ICD-10-CM | POA: Diagnosis not present

## 2013-09-02 ENCOUNTER — Ambulatory Visit
Admission: RE | Admit: 2013-09-02 | Discharge: 2013-09-02 | Disposition: A | Discharge status: Home / Self Care | Payer: Medicare Other | Source: Ambulatory Visit | Attending: Radiation Oncology | Admitting: Radiation Oncology

## 2013-09-02 ENCOUNTER — Encounter: Payer: Self-pay | Admitting: Radiation Oncology

## 2013-09-02 VITALS — BP 155/95 | HR 65 | Temp 97.6°F | Resp 20

## 2013-09-02 DIAGNOSIS — C50919 Malignant neoplasm of unspecified site of unspecified female breast: Secondary | ICD-10-CM | POA: Diagnosis not present

## 2013-09-02 DIAGNOSIS — C50411 Malignant neoplasm of upper-outer quadrant of right female breast: Secondary | ICD-10-CM

## 2013-09-02 DIAGNOSIS — Z510 Encounter for antineoplastic radiation therapy: Secondary | ICD-10-CM | POA: Diagnosis not present

## 2013-09-02 NOTE — Progress Notes (Addendum)
Left breast 15 tx done, dermatituis on breast/chest area, itches some, using radiaplex bid, no pain,declined weight 4:05 PM

## 2013-09-02 NOTE — Progress Notes (Signed)
Weekly Management Note:  Site: Right breast Current Dose:  3750  cGy Projected Dose: 4250  cGy  Narrative: The patient is seen today for routine under treatment assessment. CBCT/MVCT images/port films were reviewed. The chart was reviewed.   She is having slightly more pruritus along the inframammary region upper inner right breast.  Physical Examination:  Filed Vitals:   09/02/13 1603  BP: 155/95  Pulse: 65  Temp: 97.6 F (36.4 C)  Resp: 20  .  Weight:  . There is an erythematous papular reaction along the right inframammary region and upper inner quadrant of the right breast. No areas of desquamation.  Impression: Tolerating radiation therapy well. She may continue with Radioplex gel or use hydrocortisone cream or use cornstarch.  Plan: Continue radiation therapy as planned. She'll finish her radiation therapy this Wednesday.

## 2013-09-03 ENCOUNTER — Ambulatory Visit
Admission: RE | Admit: 2013-09-03 | Discharge: 2013-09-03 | Disposition: A | Discharge status: Home / Self Care | Payer: Medicare Other | Source: Ambulatory Visit | Attending: Radiation Oncology | Admitting: Radiation Oncology

## 2013-09-03 DIAGNOSIS — C50919 Malignant neoplasm of unspecified site of unspecified female breast: Secondary | ICD-10-CM | POA: Diagnosis not present

## 2013-09-03 DIAGNOSIS — Z510 Encounter for antineoplastic radiation therapy: Secondary | ICD-10-CM | POA: Diagnosis not present

## 2013-09-04 ENCOUNTER — Encounter: Payer: Self-pay | Admitting: Radiation Oncology

## 2013-09-04 ENCOUNTER — Ambulatory Visit
Admission: RE | Admit: 2013-09-04 | Discharge: 2013-09-04 | Disposition: A | Discharge status: Home / Self Care | Payer: Medicare Other | Source: Ambulatory Visit | Attending: Radiation Oncology | Admitting: Radiation Oncology

## 2013-09-04 DIAGNOSIS — C50919 Malignant neoplasm of unspecified site of unspecified female breast: Secondary | ICD-10-CM | POA: Diagnosis not present

## 2013-09-04 DIAGNOSIS — Z510 Encounter for antineoplastic radiation therapy: Secondary | ICD-10-CM | POA: Diagnosis not present

## 2013-09-04 NOTE — Progress Notes (Signed)
[facility] Radiation Oncology End of Treatment Note  Name:[name]  Date: 09/04/2013 UJW:[id number] DOB:[birth year]   Status:outpatient    CC: [name], MD  Dr. Abigail Miyamoto  REFERRING PHYSICIAN:   Dr. Abigail Miyamoto   DIAGNOSIS:  Stage I (T1a N0 M0) invasive lobular/LCIS of the right breast  INDICATION FOR TREATMENT: Curative   TREATMENT DATES: 08/14/2003  through 09/06/2013                          SITE/DOSE:  Right breast 4250 cGy in 17 sessions (hypofractionated)                         BEAMS/ENERGY:  6 MV photons, tangential fields to the right breast                 NARRATIVE: Ms. Holloway tolerated treatment well with the expected degree of radiation dermatitis by completion of therapy. She was treated with Radioplex gel.                           PLAN: Routine followup in one month. Patient instructed to call if questions or worsening complaints in interim.

## 2013-09-19 DIAGNOSIS — L570 Actinic keratosis: Secondary | ICD-10-CM | POA: Diagnosis not present

## 2013-09-19 DIAGNOSIS — Z23 Encounter for immunization: Secondary | ICD-10-CM | POA: Diagnosis not present

## 2013-09-24 ENCOUNTER — Encounter (INDEPENDENT_AMBULATORY_CARE_PROVIDER_SITE_OTHER): Payer: Self-pay | Admitting: Surgery

## 2013-09-26 ENCOUNTER — Other Ambulatory Visit: Payer: Self-pay

## 2013-09-30 ENCOUNTER — Encounter: Payer: Self-pay | Admitting: *Deleted

## 2013-09-30 DIAGNOSIS — Z923 Personal history of irradiation: Secondary | ICD-10-CM | POA: Insufficient documentation

## 2013-10-01 ENCOUNTER — Ambulatory Visit
Admission: RE | Admit: 2013-10-01 | Discharge: 2013-10-01 | Disposition: A | Discharge status: Home / Self Care | Payer: Medicare Other | Source: Ambulatory Visit | Attending: Radiation Oncology | Admitting: Radiation Oncology

## 2013-10-01 ENCOUNTER — Encounter: Payer: Self-pay | Admitting: Radiation Oncology

## 2013-10-01 VITALS — BP 148/95 | HR 54 | Temp 98.3°F | Resp 20 | Wt 137.0 lb

## 2013-10-01 DIAGNOSIS — C50411 Malignant neoplasm of upper-outer quadrant of right female breast: Secondary | ICD-10-CM

## 2013-10-01 NOTE — Progress Notes (Signed)
Pt denies pain, fatigue, loss of appetite. She states her skin of right breast is healed. Pt has FU w/Dr Welton Flakes on 10/03/13.

## 2013-10-01 NOTE — Progress Notes (Signed)
CC: Dr. Abigail Miyamoto  Followup note:  [name] returns today approximately 1 month following completion of radiation therapy following conservative surgery in the management of herT1a invasive lobular/LCIS of the right breast. She is without complaints today. She'll see Dr. Welton Flakes later this week for discussion of adjuvant tamoxifen. She'll see Dr. Magnus Ivan again on December 11.  Physical examination: Alert and oriented. Filed Vitals:   10/01/13 1508  BP: 148/95  Pulse: 54  Temp: 98.3 F (36.8 C)  Resp: 20  Head and neck examination: Grossly unremarkable. Nodes: Without palpable cervical, supraclavicular, or axillary lymphadenopathy. Chest: Lungs clear. Breasts: The right breast remains enlarged secondary to a seroma.. Her lateral partial mastectomy wound is well-healed. No discreet masses are appreciated. Left breast without masses or lesions. Abdomen without hepatomegaly. Extremities: Without edema.   Impression: Satisfactory progress. She'll see Dr. Welton Flakes later this week for discussion of adjuvant/chemopreventative tamoxifen. She'll see Dr. Magnus Ivan in December. She typically has screening mammography in June, and I feel that she can have a baseline diagnostic mammogram on the right, and a screening mammogram on the left in June of 2015. This can be scheduled by Dr. Welton Flakes or Dr. Magnus Ivan.

## 2013-10-03 ENCOUNTER — Ambulatory Visit (HOSPITAL_BASED_OUTPATIENT_CLINIC_OR_DEPARTMENT_OTHER): Payer: Medicare Other | Admitting: Oncology

## 2013-10-03 ENCOUNTER — Telehealth: Payer: Self-pay | Admitting: *Deleted

## 2013-10-03 ENCOUNTER — Encounter: Payer: Self-pay | Admitting: Oncology

## 2013-10-03 VITALS — BP 139/85 | HR 79 | Temp 97.4°F | Resp 20 | Ht 65.5 in | Wt 136.0 lb

## 2013-10-03 DIAGNOSIS — C50411 Malignant neoplasm of upper-outer quadrant of right female breast: Secondary | ICD-10-CM

## 2013-10-03 DIAGNOSIS — Z170 Estrogen receptor positive status [ER+]: Secondary | ICD-10-CM

## 2013-10-03 DIAGNOSIS — C50419 Malignant neoplasm of upper-outer quadrant of unspecified female breast: Secondary | ICD-10-CM

## 2013-10-03 MED ORDER — TAMOXIFEN CITRATE 20 MG PO TABS: 20.0000 mg | ORAL_TABLET | Freq: Every day | ORAL | Status: AC

## 2013-10-03 NOTE — Telephone Encounter (Signed)
appts made and printed...td 

## 2013-10-03 NOTE — Progress Notes (Signed)
OFFICE PROGRESS NOTE  CC**  Michelle Hillier, MD [address] [place] [address] Dr. Abigail Miyamoto  Dr. Chipper Herb  DIAGNOSIS: 67 year old female with T1 N0 invasive lobular carcinoma/LCIS of the right breast. Patient is seen in the multidisciplinary breast clinic    STAGE:  Breast cancer of upper-outer quadrant of right female breast  Primary site: Breast (Right)  Staging method: AJCC 7th Edition  Clinical: Stage IA (T1c, N0, cM0)  Summary: Stage IA (T1c, N0, cM0)  PRIOR THERAPY:  #1screening mammogram on 04/23/2013 was found to have a suspicious calcifications in the right breast. 05/10/2013 patient had additional views performed that showed suspicious calcifications within the upper outer quadrant. She had a stereotactic core biopsy performed on 05/20/2013. The pathology revealed invasive lobular carcinoma with LCIS. Lymph node was benign. She had tumor that was ER positive PR negative with a Ki-67 of 10%.  #2 patient is status post right lumpectomy with sentinel lymph node biopsy performed on 06/11/2013. Her final pathology revealed focal lobular carcinoma in situ. No invasive disease was found. Sentinel nodes were negative for metastatic disease. Postoperatively she is doing well.  #3 status post postlumpectomy radiation therapy administered 08/06/2013 through 09/04/2013 administered by Dr. Chipper Herb. Overall she tolerated it well.  #4 begin antiestrogen therapy adjuvantly with curative intent consisting of tamoxifen 20 mg daily for a total of 5-10 years. Risks benefits side effects of treatment were discussed with the patient. Begin 10/03/2013.  CURRENT THERAPY: Curative intent Tamoxifen 20 mg daily beginning 10/03/2013  INTERVAL HISTORY: TRINTY MARKEN 67 y.o. female returns for followup visit. Overall she's doing well. Her incision scar is healing very nicely. She is recovering from radiation therapy. She tolerated the radiation quite nicely. She is  really without any complaints except for some tenderness in the right breast at the lumpectomy site. She denies any nausea vomiting fevers chills night sweats headaches shortness of breath chest pains palpitations no myalgias and arthralgias no peripheral paresthesias. She has no abdominal pain no vaginal discharge or bleeding. Remainder of the 10 point review of systems is negative.  MEDICAL HISTORY: Past Medical History  Diagnosis Date  . Diverticulosis   . Adenomatous colon polyp   . Arthritis   . Allergic rhinitis   . HLD (hyperlipidemia)   . IBS (irritable bowel syndrome)   . Graves' disease     2011  . Breast cancer 05/20/13    right  . Hx of radiation therapy 08/13/13- 09/06/13    right breast 4250 cGy 17 sessions    ALLERGIES:  is allergic to amoxicillin and terak.  MEDICATIONS:  Current Outpatient Prescriptions  Medication Sig Dispense Refill  . aspirin EC 81 MG tablet Take 81 mg by mouth every morning.       Marland Kitchen atorvastatin (LIPITOR) 10 MG tablet Take 10 mg by mouth every evening.       Marland Kitchen buPROPion (WELLBUTRIN SR) 150 MG 12 hr tablet Take 150 mg by mouth every morning.       . diclofenac sodium (VOLTAREN) 1 % GEL Apply 2 application topically daily as needed (Knee pain).      . Echinacea 125 MG CAPS Take 125 mg by mouth as needed (Cold prophylaxis).       . fluticasone (FLONASE) 50 MCG/ACT nasal spray Place 2 sprays into the nose daily.      . fluticasone (FLONASE) 50 MCG/ACT nasal spray       . GLUCOSAMINE-CHONDROITIN PO Take 2 tablets by mouth every morning.       Marland Kitchen  hyaluronate sodium (RADIAPLEXRX) GEL Apply topically 2 (two) times daily.      Marland Kitchen ibuprofen (ADVIL) 200 MG tablet Take 400 mg by mouth every 6 (six) hours as needed for pain.       . Melatonin 5 MG TABS Take 5 mg by mouth at bedtime.      . meloxicam (MOBIC) 7.5 MG tablet Take 7.5 mg by mouth daily.      . Probiotic Product (PROBIOTIC DAILY PO) Take 1 tablet by mouth daily.      Marland Kitchen SYNTHROID 100 MCG tablet        . SYNTHROID 88 MCG tablet       . Zolpidem Tartrate (AMBIEN PO) Take 1 tablet by mouth at bedtime as needed (Sleep).        No current facility-administered medications for this visit.    SURGICAL HISTORY:  Past Surgical History  Procedure Laterality Date  . Colonoscopy w/ biopsies and polypectomy  multiple  . Breast biopsy    . Knee arthroscopy      Left Knee  . Breast lumpectomy      06/11/13  with lymph node biopsy  . Breast lumpectomy with needle localization and axillary sentinel lymph node bx Right 06/11/2013    Procedure: BREAST LUMPECTOMY WITH NEEDLE LOCALIZATION AND AXILLARY SENTINEL LYMPH NODE BX;  Surgeon: Shelly Rubenstein, MD;  Location: [facility] OR;  Service: General;  Laterality: Right;  Needle localization BCG 7:30 nuc med 9:30      REVIEW OF SYSTEMS:  Pertinent items are noted in HPI.   HEALTH MAINTENANCE:    PHYSICAL EXAMINATION: Blood pressure 139/85, pulse 79, temperature 97.4 F (36.3 C), temperature source Oral, resp. rate 20, height 5' 5.5" (1.664 m), weight 136 lb (61.689 kg). Body mass index is 22.28 kg/(m^2). ECOG PERFORMANCE STATUS: 0 - Asymptomatic   General appearance: alert, cooperative and appears stated age Resp: clear to auscultation bilaterally Cardio: regular rate and rhythm GI: soft, non-tender; bowel sounds normal; no masses,  no organomegaly Extremities: extremities normal, atraumatic, no cyanosis or edema Neurologic: Grossly normal Right breast: Surgical site looks healed. Left breast no masses or nipple discharge  LABORATORY DATA: Lab Results  Component Value Date   WBC 6.2 05/29/2013   HGB 14.1 05/29/2013   HCT 41.4 05/29/2013   MCV 97.9 05/29/2013   PLT 212 05/29/2013      Chemistry      Component Value Date/Time   NA 140 05/29/2013 1205   K 4.7 05/29/2013 1205   CO2 27 05/29/2013 1205   BUN 19.3 05/29/2013 1205   CREATININE 0.8 05/29/2013 1205      Component Value Date/Time   CALCIUM 9.6 05/29/2013 1205   ALKPHOS 71 05/29/2013 1205   AST 18  05/29/2013 1205   ALT 15 05/29/2013 1205   BILITOT 0.67 05/29/2013 1205    Diagnosis 1. Breast, lumpectomy, Right - BREAST PARENCHYMA WITH FOCAL LOBULAR CARCINOMA IN SITU. - BIOPSY SITE CHANGES WITH FIBROSIS AND SCATTERED CHRONIC INFLAMMATION. - BENIGN DUCTS WITH MICROCALCIFICATIONS PRESENT. - ONE BENIGN LYMPH NODE WITH NO TUMOR SEEN (0/1). - SEE COMMENT. 2. Lymph node, sentinel, biopsy, Right axillary #1 - ONE BENIGN LYMPH NODE WITH NO TUMOR SEEN (0/1). - SEE COMMENT. 3. Lymph node, sentinel, biopsy, Right axillary #2 - ONE BENIGN LYMPH NODE WITH NO TUMOR SEEN (0/1). - SEE COMMENT. Microscopic Comment 1. Immunohistochemical stains are performed. A cytokeratin AE1/AE3 is performed on two blocks (blocks 1C and 1D). The stain fails to highlight evidence of invasive  carcinoma. An E-cadherin and cytokeratin 5/6 is also performed on a single block (block 1D). The E-cadherin stain is negative in the focus of epithelial proliferation while the cytokeratin 5/6 stain highlights an intact myoepithelial layer. The staining pattern coupled with the morphology is consistent with focal lobular carcinoma in situ. The entire hematoma and surrounding tissue is grossly submitted and histologically examined. Given that the lumpectomy specimen is from the previous biopsy site, the tumor is best staged as pTis (LCIS) pN0, pMX. As invasive tumor is not identified, a formal oncology staging template will not be issued. Dr Colonel Bald has seen the first specimen in consultation with essential agreement of the above diagnosis. 2. , 3. Immunohistochemical stains for cytokeratin AE1/AE3 are performed on both lymph nodes. The stains are negative, confirming the lack of metastatic carcinoma. (RH:ecj 06/14/2013) Zandra Abts MD Pathologist, Electronic Signature (Case signed 06/14/2013)   RADIOGRAPHIC STUDIES:  Nm Sentinel Node Inj-no Rpt (breast)  06/11/2013   CLINICAL DATA: right breast cancer   Sulfur colloid was  injected intradermally by the nuclear medicine  technologist for breast cancer sentinel node localization.    Mm Rt Plc Breast Loc Dev   1st Lesion  Inc Mammo Guide  06/11/2013   *RADIOLOGY REPORT*  Clinical Data: Right breast cancer for surgery  NEEDLE LOCALIZATION WITH MAMMOGRAPHIC GUIDANCE AND SPECIMEN RADIOGRAPH  Comparison: Previous exam(s)  Patient presents for needle localization prior to right breast surgery. I met with the patient and we discussed the procedure of needle localization including benefits and alternatives. We discussed the high likelihood of a successful procedure. We discussed the risks of the procedure, including infection, bleeding, tissue injury, and further surgery. Informed, written consent was given. The usual time-out protocol was performed immediately prior to the procedure.  Using mammographic guidance, sterile technique, 2% lidocaine and a 7 cm modified Kopans needle, biopsy clip and calcifications localized using a lateral approach. The films are marked for Dr. Magnus Ivan.  Specimen radiograph confirms biopsy clip, calcifications, wire present in the tissue sample. The specimen is marked for pathology.  IMPRESSION: Needle localization right breast breast. No apparent complications.   Original Report Authenticated By: Sherian Rein, M.D.    ASSESSMENT: 67 year old female with  #1  invasive lobular carcinoma of the right breast with lobular carcinoma in situ. Tumor was ER positive PR positive. She was seen at the multidisciplinary breast clinic. She is now status post right lumpectomy with sentinel lymph node biopsy. The final pathology only revealed lobular carcinoma in situ. Postoperatively doing well.  #2 s/p radiation therapy completed on 09/04/13  #3 patient is now recommended to begin adjuvant curative intent tamoxifen 20 mg daily. She has no evidence of her history of bleeding or blood clots. We discussed length of therapy which would include 5-10 years. We also  discussed side effects. Literature was given to her. She understands the rationale for this. She concurs with beginning her treatment.   PLAN:  #1 begin tamoxifen 20 mg daily. She is to report any problems whatsoever.  2 I will plan on seeing the patient back in 3 months time for followup and evaluation of any side effects that she may possibly experience from treatment.  All questions were answered. The patient knows to call the clinic with any problems, questions or concerns. We can certainly see the patient much sooner if necessary.  I spent 20 minutes counseling the patient face to face. The total time spent in the appointment was 30 minutes.    Drue Second, MD  Medical/Oncology [facility] [phone] (beeper) [phone] (Office)  10/03/2013, 11:49 AM

## 2013-10-03 NOTE — Patient Instructions (Signed)

## 2013-10-04 DIAGNOSIS — Z853 Personal history of malignant neoplasm of breast: Secondary | ICD-10-CM | POA: Diagnosis not present

## 2013-10-04 DIAGNOSIS — C50919 Malignant neoplasm of unspecified site of unspecified female breast: Secondary | ICD-10-CM | POA: Diagnosis not present

## 2013-10-04 DIAGNOSIS — M129 Arthropathy, unspecified: Secondary | ICD-10-CM | POA: Diagnosis not present

## 2013-10-04 DIAGNOSIS — F329 Major depressive disorder, single episode, unspecified: Secondary | ICD-10-CM | POA: Diagnosis not present

## 2013-10-04 DIAGNOSIS — E0500 Thyrotoxicosis with diffuse goiter without thyrotoxic crisis or storm: Secondary | ICD-10-CM | POA: Diagnosis not present

## 2013-10-04 DIAGNOSIS — E039 Hypothyroidism, unspecified: Secondary | ICD-10-CM | POA: Diagnosis not present

## 2013-10-04 DIAGNOSIS — E7800 Pure hypercholesterolemia, unspecified: Secondary | ICD-10-CM | POA: Diagnosis not present

## 2013-10-04 DIAGNOSIS — Z79899 Other long term (current) drug therapy: Secondary | ICD-10-CM | POA: Diagnosis not present

## 2013-10-31 ENCOUNTER — Ambulatory Visit (INDEPENDENT_AMBULATORY_CARE_PROVIDER_SITE_OTHER): Payer: Medicare Other | Admitting: Surgery

## 2013-10-31 VITALS — BP 112/60 | HR 48 | Temp 97.4°F | Resp 17 | Ht 65.0 in | Wt 139.0 lb

## 2013-10-31 DIAGNOSIS — C50919 Malignant neoplasm of unspecified site of unspecified female breast: Secondary | ICD-10-CM

## 2013-10-31 DIAGNOSIS — C50911 Malignant neoplasm of unspecified site of right female breast: Secondary | ICD-10-CM

## 2013-10-31 NOTE — Progress Notes (Signed)
Subjective:     Patient ID: [name], female   DOB: 13-Sep-1946, 67 y.o.   MRN: [id number]  HPI She is here for long-term followup regarding her invasive lobular carcinoma of the right breast. She has finished her radiation therapy approximately 2 months ago and was started on tamoxifen last month  Review of Systems     Objective:   Physical Exam On exam, there is some radiation changes of the breast on the right side. The incision is healing well. There is no axillary adenopathy or arm swelling.    Assessment:     Stable with long-term history of right breast cancer     Plan:     She will continue her current care at the cancer Center and I will see her back in one year

## 2013-11-01 ENCOUNTER — Ambulatory Visit (INDEPENDENT_AMBULATORY_CARE_PROVIDER_SITE_OTHER): Payer: Medicare Other | Admitting: Surgery

## 2013-11-22 ENCOUNTER — Telehealth: Payer: Self-pay | Admitting: Oncology

## 2013-11-25 DIAGNOSIS — J019 Acute sinusitis, unspecified: Secondary | ICD-10-CM | POA: Diagnosis not present

## 2013-12-04 ENCOUNTER — Other Ambulatory Visit: Payer: Self-pay | Admitting: Dermatology

## 2013-12-04 DIAGNOSIS — D485 Neoplasm of uncertain behavior of skin: Secondary | ICD-10-CM | POA: Diagnosis not present

## 2013-12-04 DIAGNOSIS — L821 Other seborrheic keratosis: Secondary | ICD-10-CM | POA: Diagnosis not present

## 2013-12-04 DIAGNOSIS — L570 Actinic keratosis: Secondary | ICD-10-CM | POA: Diagnosis not present

## 2013-12-26 DIAGNOSIS — L570 Actinic keratosis: Secondary | ICD-10-CM | POA: Diagnosis not present

## 2014-01-21 DIAGNOSIS — L570 Actinic keratosis: Secondary | ICD-10-CM | POA: Diagnosis not present

## 2014-01-29 DIAGNOSIS — H5200 Hypermetropia, unspecified eye: Secondary | ICD-10-CM | POA: Diagnosis not present

## 2014-01-29 DIAGNOSIS — H04129 Dry eye syndrome of unspecified lacrimal gland: Secondary | ICD-10-CM | POA: Diagnosis not present

## 2014-01-30 ENCOUNTER — Ambulatory Visit: Payer: Medicare Other | Admitting: Oncology

## 2014-01-30 ENCOUNTER — Other Ambulatory Visit: Payer: Medicare Other

## 2014-01-30 DIAGNOSIS — L0889 Other specified local infections of the skin and subcutaneous tissue: Secondary | ICD-10-CM | POA: Diagnosis not present

## 2014-02-12 ENCOUNTER — Encounter: Payer: Self-pay | Admitting: Oncology

## 2014-02-12 ENCOUNTER — Other Ambulatory Visit (HOSPITAL_BASED_OUTPATIENT_CLINIC_OR_DEPARTMENT_OTHER): Payer: Medicare Other

## 2014-02-12 ENCOUNTER — Ambulatory Visit (HOSPITAL_BASED_OUTPATIENT_CLINIC_OR_DEPARTMENT_OTHER): Payer: Medicare Other | Admitting: Oncology

## 2014-02-12 VITALS — BP 134/78 | HR 50 | Temp 98.0°F | Resp 18 | Ht 65.0 in

## 2014-02-12 DIAGNOSIS — IMO0002 Reserved for concepts with insufficient information to code with codable children: Secondary | ICD-10-CM

## 2014-02-12 DIAGNOSIS — C50411 Malignant neoplasm of upper-outer quadrant of right female breast: Secondary | ICD-10-CM

## 2014-02-12 DIAGNOSIS — C50419 Malignant neoplasm of upper-outer quadrant of unspecified female breast: Secondary | ICD-10-CM

## 2014-02-12 DIAGNOSIS — Z170 Estrogen receptor positive status [ER+]: Secondary | ICD-10-CM | POA: Diagnosis not present

## 2014-02-12 LAB — CBC WITH DIFFERENTIAL/PLATELET
BASO%: 0.1 % (ref 0.0–2.0)
BASOS ABS: 0 10*3/uL (ref 0.0–0.1)
EOS%: 2 % (ref 0.0–7.0)
Eosinophils Absolute: 0.1 10*3/uL (ref 0.0–0.5)
HEMATOCRIT: 42.9 % (ref 34.8–46.6)
HEMOGLOBIN: 14 g/dL (ref 11.6–15.9)
LYMPH#: 1.3 10*3/uL (ref 0.9–3.3)
LYMPH%: 28.4 % (ref 14.0–49.7)
MCH: 32.7 pg (ref 25.1–34.0)
MCHC: 32.6 g/dL (ref 31.5–36.0)
MCV: 100.4 fL (ref 79.5–101.0)
MONO#: 0.4 10*3/uL (ref 0.1–0.9)
MONO%: 8.4 % (ref 0.0–14.0)
NEUT#: 2.8 10*3/uL (ref 1.5–6.5)
NEUT%: 61.1 % (ref 38.4–76.8)
PLATELETS: 202 10*3/uL (ref 145–400)
RBC: 4.27 10*6/uL (ref 3.70–5.45)
RDW: 13.3 % (ref 11.2–14.5)
WBC: 4.7 10*3/uL (ref 3.9–10.3)

## 2014-02-12 LAB — COMPREHENSIVE METABOLIC PANEL (CC13)
ALT: 13 U/L (ref 0–55)
ANION GAP: 8 meq/L (ref 3–11)
AST: 20 U/L (ref 5–34)
Albumin: 3.8 g/dL (ref 3.5–5.0)
Alkaline Phosphatase: 64 U/L (ref 40–150)
BUN: 14.7 mg/dL (ref 7.0–26.0)
CALCIUM: 9.2 mg/dL (ref 8.4–10.4)
CHLORIDE: 111 meq/L — AB (ref 98–109)
CO2: 24 mEq/L (ref 22–29)
CREATININE: 0.8 mg/dL (ref 0.6–1.1)
Glucose: 103 mg/dl (ref 70–140)
Potassium: 4 mEq/L (ref 3.5–5.1)
Sodium: 143 mEq/L (ref 136–145)
Total Bilirubin: 0.41 mg/dL (ref 0.20–1.20)
Total Protein: 6.6 g/dL (ref 6.4–8.3)

## 2014-02-12 NOTE — Patient Instructions (Signed)
Continue tamoxifen 20 mg daily  We will see you back in 6 months

## 2014-02-12 NOTE — Progress Notes (Signed)
[place] OFFICE PROGRESS NOTE  Patient Care Team: Hulan Fess, MD as PCP - General (Family Medicine)  Dr. Coralie Keens  Dr. Arloa Koh  DIAGNOSIS: 68 year old female with T1 N0 invasive lobular carcinoma/LCIS of the right breast. Patient is seen in the multidisciplinary breast clinic   STAGE:  Breast cancer of upper-outer quadrant of right female breast  Primary site: Breast (Right)  Staging method: AJCC 7th Edition  Clinical: Stage IA (T1c, N0, cM0)  Summary: Stage IA (T1c, N0, cM0)   SUMMARY OF ONCOLOGIC HISTORY: #1screening mammogram on 04/23/2013 was found to have a suspicious calcifications in the right breast. 05/10/2013 patient had additional views performed that showed suspicious calcifications within the upper outer quadrant. She had a stereotactic core biopsy performed on 05/20/2013. The pathology revealed invasive lobular carcinoma with LCIS. Lymph node was benign. She had tumor that was ER positive PR negative with a Ki-67 of 10%.  #2 patient is status post right lumpectomy with sentinel lymph node biopsy performed on 06/11/2013. Her final pathology revealed focal lobular carcinoma in situ. No invasive disease was found. Sentinel nodes were negative for metastatic disease. Postoperatively she is doing well.  #3 status post postlumpectomy radiation therapy administered 08/06/2013 through 09/04/2013 administered by Dr. Arloa Koh. Overall she tolerated it well.  #4 begin antiestrogen therapy adjuvantly with curative intent consisting of tamoxifen 20 mg daily for a total of 5-10 years. Risks benefits side effects of treatment were discussed with the patient. Begin 10/03/2013.   CURRENT THERAPY: Curative intent Tamoxifen 20 mg daily beginning 10/03/2013   INTERVAL HISTORY: [name] 68 y.o. female returns for followup visit. She has been on tamoxifen since November 2014. She tells me she is doing well with it. She has not had any significant hot  flashes. Although she does tell me she feels a little warm. No night sweats. She has no vaginal bleeding discharge. No aches or pains. She does feel some right-sided breast tenderness she does have a seroma. She feels more pain sometimes numbness at the surgical site. She has not noticed any nipple discharge.  I have reviewed the past medical history, past surgical history, social history and family history with the patient and they are unchanged from previous note.  ALLERGIES:  is allergic to amoxicillin and terak.  MEDICATIONS:  Current Outpatient Prescriptions  Medication Sig Dispense Refill  . aspirin EC 81 MG tablet Take 81 mg by mouth every morning.       Marland Kitchen atorvastatin (LIPITOR) 10 MG tablet Take 10 mg by mouth every evening.       Marland Kitchen buPROPion (WELLBUTRIN SR) 150 MG 12 hr tablet Take 150 mg by mouth every morning.       . CELEBREX 200 MG capsule       . diclofenac sodium (VOLTAREN) 1 % GEL Apply 2 application topically daily as needed (Knee pain).      . Echinacea 125 MG CAPS Take 125 mg by mouth as needed (Cold prophylaxis).       . fluticasone (FLONASE) 50 MCG/ACT nasal spray Place 2 sprays into the nose daily.      . fluticasone (FLONASE) 50 MCG/ACT nasal spray       . GLUCOSAMINE-CHONDROITIN PO Take 2 tablets by mouth every morning.       Marland Kitchen ibuprofen (ADVIL) 200 MG tablet Take 400 mg by mouth every 6 (six) hours as needed for pain.       . Melatonin 5 MG TABS Take 5 mg  by mouth at bedtime.      . Probiotic Product (PROBIOTIC DAILY PO) Take 1 tablet by mouth daily.      Marland Kitchen SYNTHROID 100 MCG tablet       . SYNTHROID 88 MCG tablet       . Zolpidem Tartrate (AMBIEN PO) Take 1 tablet by mouth at bedtime as needed (Sleep).        No current facility-administered medications for this visit.    REVIEW OF SYSTEMS:   Constitutional: Denies fevers, chills or abnormal weight loss Eyes: Denies blurriness of vision Ears, nose, mouth, throat, and face: Denies mucositis or sore  throat Respiratory: Denies cough, dyspnea or wheezes Cardiovascular: Denies palpitation, chest discomfort or lower extremity swelling Gastrointestinal:  Denies nausea, heartburn or change in bowel habits Skin: Denies abnormal skin rashes Lymphatics: Denies new lymphadenopathy or easy bruising Neurological:Denies numbness, tingling or new weaknesses Behavioral/Psych: Mood is stable, no new changes  All other systems were reviewed with the patient and are negative.  PHYSICAL EXAMINATION: ECOG PERFORMANCE STATUS: 0 - Asymptomatic  Filed Vitals:   02/12/14 1523  BP: 134/78  Pulse: 50  Temp: 98 F (36.7 C)  Resp: 18   Filed Weights    GENERAL:alert, no distress and comfortable SKIN: skin color, texture, turgor are normal, no rashes or significant lesions EYES: normal, Conjunctiva are pink and non-injected, sclera clear OROPHARYNX:no exudate, no erythema and lips, buccal mucosa, and tongue normal  NECK: supple, thyroid normal size, non-tender, without nodularity LYMPH:  no palpable lymphadenopathy in the cervical, axillary or inguinal LUNGS: clear to auscultation and percussion with normal breathing effort HEART: regular rate & rhythm and no murmurs and no lower extremity edema ABDOMEN:abdomen soft, non-tender and normal bowel sounds Musculoskeletal:no cyanosis of digits and no clubbing  NEURO: alert & oriented x 3 with fluent speech, no focal motor/sensory deficits Breasts: right breast normal without mass, skin or nipple changes or axillary nodes, left breast normal without mass, skin or nipple changes or axillary nodes, surgical scars noted  In the right breast with seroma.  LABORATORY DATA:  I have reviewed the data as listed    Component Value Date/Time   NA 143 02/12/2014 1516   K 4.0 02/12/2014 1516   CO2 24 02/12/2014 1516   GLUCOSE 103 02/12/2014 1516   BUN 14.7 02/12/2014 1516   CREATININE 0.8 02/12/2014 1516   CALCIUM 9.2 02/12/2014 1516   PROT 6.6 02/12/2014 1516    ALBUMIN 3.8 02/12/2014 1516   AST 20 02/12/2014 1516   ALT 13 02/12/2014 1516   ALKPHOS 64 02/12/2014 1516   BILITOT 0.41 02/12/2014 1516    No results found for this basename: SPEP, UPEP,  kappa and lambda light chains    Lab Results  Component Value Date   WBC 4.7 02/12/2014   NEUTROABS 2.8 02/12/2014   HGB 14.0 02/12/2014   HCT 42.9 02/12/2014   MCV 100.4 02/12/2014   PLT 202 02/12/2014      Chemistry      Component Value Date/Time   NA 143 02/12/2014 1516   K 4.0 02/12/2014 1516   CO2 24 02/12/2014 1516   BUN 14.7 02/12/2014 1516   CREATININE 0.8 02/12/2014 1516      Component Value Date/Time   CALCIUM 9.2 02/12/2014 1516   ALKPHOS 64 02/12/2014 1516   AST 20 02/12/2014 1516   ALT 13 02/12/2014 1516   BILITOT 0.41 02/12/2014 1516       RADIOGRAPHIC STUDIES: I have personally reviewed the  radiological images as listed and agreed with the findings in the report. No results found.    ASSESSMENT & PLAN:  68 year old female with  #1 invasive lobular carcinoma of the right breast (stage I T1 N0) and lobular carcinoma in situ. Patient is status post lumpectomy with sentinel lymph node biopsy. The tumor was ER positive PR positive HER-2/neu negative. Sentinel nodes were negative for metastatic disease. She received adjuvant radiation therapy completed 09/04/2013. She was then begun on curative intent tamoxifen 20 mg daily starting November 2000 413. Thus far she is tolerating it well without any significant problems. She has no evidence of recurrent disease.  #2 right breast seroma: Patient will continue to be monitored for this. I am not seeing any local signs of infections in the right breast.  #3 followup: Patient will be seen back in 6 months with blood work. I reviewed her CBC today which looks terrific her electrolytes and LFTs also looked good.  Orders Placed This Encounter  Procedures  . CBC with Differential    Standing Status: Future     Number of Occurrences:      Standing  Expiration Date: 02/12/2015  . Comprehensive metabolic panel (Cmet) - [facility]    Standing Status: Future     Number of Occurrences:      Standing Expiration Date: 02/12/2015   All questions were answered. The patient knows to call the clinic with any problems, questions or concerns. No barriers to learning was detected. I spent 15 minutes counseling the patient face to face. The total time spent in the appointment was 30 minutes and more than 50% was on counseling and review of test results and coordination of care.     Marcy Panning, MD 02/12/2014 4:00 PM

## 2014-02-13 ENCOUNTER — Telehealth: Payer: Self-pay | Admitting: Oncology

## 2014-02-13 NOTE — Telephone Encounter (Signed)
, °

## 2014-03-06 DIAGNOSIS — Z808 Family history of malignant neoplasm of other organs or systems: Secondary | ICD-10-CM | POA: Diagnosis not present

## 2014-03-06 DIAGNOSIS — L570 Actinic keratosis: Secondary | ICD-10-CM | POA: Diagnosis not present

## 2014-03-06 DIAGNOSIS — L821 Other seborrheic keratosis: Secondary | ICD-10-CM | POA: Diagnosis not present

## 2014-03-06 DIAGNOSIS — L989 Disorder of the skin and subcutaneous tissue, unspecified: Secondary | ICD-10-CM | POA: Diagnosis not present

## 2014-03-06 DIAGNOSIS — L819 Disorder of pigmentation, unspecified: Secondary | ICD-10-CM | POA: Diagnosis not present

## 2014-04-02 ENCOUNTER — Other Ambulatory Visit: Payer: Self-pay | Admitting: Adult Health

## 2014-04-02 DIAGNOSIS — Z9889 Other specified postprocedural states: Secondary | ICD-10-CM

## 2014-04-02 DIAGNOSIS — Z853 Personal history of malignant neoplasm of breast: Secondary | ICD-10-CM

## 2014-04-22 DIAGNOSIS — Z124 Encounter for screening for malignant neoplasm of cervix: Secondary | ICD-10-CM | POA: Diagnosis not present

## 2014-04-24 ENCOUNTER — Ambulatory Visit
Admission: RE | Admit: 2014-04-24 | Discharge: 2014-04-24 | Disposition: A | Discharge status: Home / Self Care | Payer: Medicare Other | Source: Ambulatory Visit | Attending: Adult Health | Admitting: Adult Health

## 2014-04-24 ENCOUNTER — Other Ambulatory Visit: Payer: Self-pay | Admitting: Adult Health

## 2014-04-24 DIAGNOSIS — Z9889 Other specified postprocedural states: Secondary | ICD-10-CM

## 2014-04-24 DIAGNOSIS — Z853 Personal history of malignant neoplasm of breast: Secondary | ICD-10-CM

## 2014-04-24 DIAGNOSIS — R922 Inconclusive mammogram: Secondary | ICD-10-CM | POA: Diagnosis not present

## 2014-06-19 ENCOUNTER — Emergency Department (HOSPITAL_COMMUNITY): Payer: Medicare Other

## 2014-06-19 ENCOUNTER — Encounter (HOSPITAL_COMMUNITY): Payer: Self-pay | Admitting: Emergency Medicine

## 2014-06-19 ENCOUNTER — Emergency Department (HOSPITAL_COMMUNITY)
Admission: EM | Admit: 2014-06-19 | Discharge: 2014-06-19 | Disposition: A | Discharge status: Home / Self Care | Payer: Medicare Other | Attending: Emergency Medicine | Admitting: Emergency Medicine

## 2014-06-19 DIAGNOSIS — Z860100 Personal history of colon polyps, unspecified: Secondary | ICD-10-CM | POA: Insufficient documentation

## 2014-06-19 DIAGNOSIS — N23 Unspecified renal colic: Secondary | ICD-10-CM | POA: Diagnosis not present

## 2014-06-19 DIAGNOSIS — Z7982 Long term (current) use of aspirin: Secondary | ICD-10-CM | POA: Insufficient documentation

## 2014-06-19 DIAGNOSIS — N201 Calculus of ureter: Secondary | ICD-10-CM | POA: Insufficient documentation

## 2014-06-19 DIAGNOSIS — IMO0002 Reserved for concepts with insufficient information to code with codable children: Secondary | ICD-10-CM | POA: Insufficient documentation

## 2014-06-19 DIAGNOSIS — E0500 Thyrotoxicosis with diffuse goiter without thyrotoxic crisis or storm: Secondary | ICD-10-CM | POA: Insufficient documentation

## 2014-06-19 DIAGNOSIS — E785 Hyperlipidemia, unspecified: Secondary | ICD-10-CM | POA: Diagnosis not present

## 2014-06-19 DIAGNOSIS — Z923 Personal history of irradiation: Secondary | ICD-10-CM | POA: Insufficient documentation

## 2014-06-19 DIAGNOSIS — N1330 Unspecified hydronephrosis: Secondary | ICD-10-CM | POA: Diagnosis not present

## 2014-06-19 DIAGNOSIS — Z8719 Personal history of other diseases of the digestive system: Secondary | ICD-10-CM | POA: Diagnosis not present

## 2014-06-19 DIAGNOSIS — Z79899 Other long term (current) drug therapy: Secondary | ICD-10-CM | POA: Insufficient documentation

## 2014-06-19 DIAGNOSIS — R109 Unspecified abdominal pain: Secondary | ICD-10-CM | POA: Diagnosis not present

## 2014-06-19 DIAGNOSIS — Z880 Allergy status to penicillin: Secondary | ICD-10-CM | POA: Insufficient documentation

## 2014-06-19 DIAGNOSIS — N200 Calculus of kidney: Secondary | ICD-10-CM | POA: Diagnosis not present

## 2014-06-19 DIAGNOSIS — M129 Arthropathy, unspecified: Secondary | ICD-10-CM | POA: Insufficient documentation

## 2014-06-19 DIAGNOSIS — Z853 Personal history of malignant neoplasm of breast: Secondary | ICD-10-CM | POA: Insufficient documentation

## 2014-06-19 LAB — COMPREHENSIVE METABOLIC PANEL
ALBUMIN: 4 g/dL (ref 3.5–5.2)
ALT: 15 U/L (ref 0–35)
AST: 21 U/L (ref 0–37)
Alkaline Phosphatase: 52 U/L (ref 39–117)
Anion gap: 14 (ref 5–15)
BUN: 14 mg/dL (ref 6–23)
CALCIUM: 9.1 mg/dL (ref 8.4–10.5)
CO2: 23 mEq/L (ref 19–32)
CREATININE: 0.87 mg/dL (ref 0.50–1.10)
Chloride: 100 mEq/L (ref 96–112)
GFR calc Af Amer: 78 mL/min — ABNORMAL LOW (ref >90)
GFR, EST NON AFRICAN AMERICAN: 67 mL/min — AB (ref >90)
Glucose, Bld: 100 mg/dL — ABNORMAL HIGH (ref 70–99)
Potassium: 3.7 mEq/L (ref 3.7–5.3)
Sodium: 137 mEq/L (ref 137–147)
Total Bilirubin: 0.3 mg/dL (ref 0.3–1.2)
Total Protein: 7.1 g/dL (ref 6.0–8.3)

## 2014-06-19 LAB — CBC
HCT: 42.9 % (ref 36.0–46.0)
Hemoglobin: 14.3 g/dL (ref 12.0–15.0)
MCH: 32.7 pg (ref 26.0–34.0)
MCHC: 33.3 g/dL (ref 30.0–36.0)
MCV: 98.2 fL (ref 78.0–100.0)
Platelets: 217 10*3/uL (ref 150–400)
RBC: 4.37 MIL/uL (ref 3.87–5.11)
RDW: 12.8 % (ref 11.5–15.5)
WBC: 5.5 10*3/uL (ref 4.0–10.5)

## 2014-06-19 LAB — URINALYSIS, ROUTINE W REFLEX MICROSCOPIC
BILIRUBIN URINE: NEGATIVE
Glucose, UA: NEGATIVE mg/dL
KETONES UR: NEGATIVE mg/dL
NITRITE: NEGATIVE
Protein, ur: NEGATIVE mg/dL
SPECIFIC GRAVITY, URINE: 1.022 (ref 1.005–1.030)
UROBILINOGEN UA: 0.2 mg/dL (ref 0.0–1.0)
pH: 5 (ref 5.0–8.0)

## 2014-06-19 LAB — URINE MICROSCOPIC-ADD ON

## 2014-06-19 LAB — LIPASE, BLOOD: Lipase: 28 U/L (ref 11–59)

## 2014-06-19 MED ORDER — TAMSULOSIN HCL 0.4 MG PO CAPS: ORAL_CAPSULE | ORAL | Status: DC

## 2014-06-19 MED ORDER — TAMSULOSIN HCL 0.4 MG PO CAPS
0.4000 mg | ORAL_CAPSULE | Freq: Once | ORAL | Status: AC
  Administered 2014-06-19: 0.4 mg via ORAL
  Filled 2014-06-19: qty 1

## 2014-06-19 MED ORDER — ONDANSETRON HCL 8 MG PO TABS: 8.0000 mg | ORAL_TABLET | Freq: Three times a day (TID) | ORAL | Status: DC | PRN

## 2014-06-19 MED ORDER — HYDROMORPHONE HCL PF 1 MG/ML IJ SOLN
1.0000 mg | Freq: Once | INTRAMUSCULAR | Status: AC
  Administered 2014-06-19: 1 mg via INTRAVENOUS
  Filled 2014-06-19: qty 1

## 2014-06-19 MED ORDER — KETOROLAC TROMETHAMINE 30 MG/ML IJ SOLN
30.0000 mg | Freq: Once | INTRAMUSCULAR | Status: AC
  Administered 2014-06-19: 30 mg via INTRAVENOUS
  Filled 2014-06-19: qty 1

## 2014-06-19 MED ORDER — ONDANSETRON HCL 4 MG/2ML IJ SOLN
4.0000 mg | Freq: Once | INTRAMUSCULAR | Status: AC
  Administered 2014-06-19: 4 mg via INTRAVENOUS
  Filled 2014-06-19: qty 2

## 2014-06-19 MED ORDER — OXYCODONE-ACETAMINOPHEN 5-325 MG PO TABS: 2.0000 | ORAL_TABLET | ORAL | Status: DC | PRN

## 2014-06-19 NOTE — ED Provider Notes (Signed)
CSN: [id number]     Arrival date & time 06/19/14  1601 History   First MD Initiated Contact with Patient 06/19/14 1617     Chief Complaint  Patient presents with  . Flank Pain  . Nausea     (Consider location/radiation/quality/duration/timing/severity/associated sxs/prior Treatment) Patient is a 68 y.o. female presenting with flank pain. The history is provided by the patient.  Flank Pain   She presents for evaluation of right flank pain. The pain started about one hour prior to coming here. She denies trauma. She has not had hematuria, dysuria, urinary frequency, fever, chills, nausea, vomiting, weakness, or dizziness. She's never had this previously. She believes that she has a kidney stone. She's taking her usual medications. There are no other known modifying factors.  Past Medical History  Diagnosis Date  . Diverticulosis   . Adenomatous colon polyp   . Arthritis   . Allergic rhinitis   . HLD (hyperlipidemia)   . IBS (irritable bowel syndrome)   . Graves' disease     2011  . Breast cancer 05/20/13    right  . Hx of radiation therapy 08/13/13- 09/06/13    right breast 4250 cGy 17 sessions   Past Surgical History  Procedure Laterality Date  . Colonoscopy w/ biopsies and polypectomy  multiple  . Breast biopsy    . Knee arthroscopy      Left Knee  . Breast lumpectomy      06/11/13  with lymph node biopsy  . Breast lumpectomy with needle localization and axillary sentinel lymph node bx Right 06/11/2013    Procedure: BREAST LUMPECTOMY WITH NEEDLE LOCALIZATION AND AXILLARY SENTINEL LYMPH NODE BX;  Surgeon: Harl Bowie, MD;  Location: [facility];  Service: General;  Laterality: Right;  Needle localization BCG 7:30 nuc med 9:30     Family History  Problem Relation Age of Onset  . Colon cancer Maternal Grandfather   . Heart disease Mother     MI  . Lung cancer Mother   . Hypertension Mother   . Diabetes      grandmother  . Alcohol abuse Father   . COPD Father   .  Breast cancer      aunts  . Breast cancer Maternal Aunt   . Pancreatic cancer Maternal Aunt    History  Substance Use Topics  . Smoking status: Never Smoker   . Smokeless tobacco: Never Used  . Alcohol Use: Yes     Comment: occasional   OB History   Grav Para Term Preterm Abortions TAB SAB Ect Mult Living                 Review of Systems  Genitourinary: Positive for flank pain.  All other systems reviewed and are negative.     Allergies  Amoxicillin and Terak  Home Medications   Prior to Admission medications   Medication Sig Start Date End Date Taking? Authorizing Provider  aspirin EC 81 MG tablet Take 81 mg by mouth every morning.    Yes Historical Provider, MD  atorvastatin (LIPITOR) 10 MG tablet Take 10 mg by mouth every evening.    Yes Historical Provider, MD  buPROPion (WELLBUTRIN SR) 150 MG 12 hr tablet Take 150 mg by mouth every morning.    Yes Historical Provider, MD  Echinacea 125 MG CAPS Take 125 mg by mouth as needed (Cold prophylaxis).    Yes Historical Provider, MD  fluticasone (FLONASE) 50 MCG/ACT nasal spray Place 2 sprays into the nose  daily.   Yes Historical Provider, MD  GLUCOSAMINE-CHONDROITIN PO Take 2 tablets by mouth every morning.    Yes Historical Provider, MD  ibuprofen (ADVIL) 200 MG tablet Take 400 mg by mouth every 6 (six) hours as needed for pain.    Yes Historical Provider, MD  Melatonin 5 MG TABS Take 5 mg by mouth at bedtime.   Yes Historical Provider, MD  Probiotic Product (PROBIOTIC DAILY PO) Take 1 tablet by mouth daily.   Yes Historical Provider, MD  SYNTHROID 100 MCG tablet Take 100 mcg by mouth every other day.  07/04/13  Yes Historical Provider, MD  SYNTHROID 88 MCG tablet Take 88 mcg by mouth every other day.  07/04/13  Yes Historical Provider, MD  TURMERIC PO Take 1 capsule by mouth daily.   Yes Historical Provider, MD  zolpidem (AMBIEN) 10 MG tablet Take 10 mg by mouth at bedtime as needed for sleep.   Yes Historical Provider, MD   ondansetron (ZOFRAN) 8 MG tablet Take 1 tablet (8 mg total) by mouth every 8 (eight) hours as needed for nausea or vomiting. 06/19/14   Richarda Blade, MD  oxyCODONE-acetaminophen (PERCOCET) 5-325 MG per tablet Take 2 tablets by mouth every 4 (four) hours as needed. 06/19/14   Richarda Blade, MD  tamsulosin (FLOMAX) 0.4 MG CAPS capsule 1 q HS to aid stone passage 06/19/14   Richarda Blade, MD   BP 170/78  Pulse 49  Temp(Src) 97.4 F (36.3 C) (Oral)  Resp 16  SpO2 97% Physical Exam  Nursing note and vitals reviewed. Constitutional: She is oriented to person, place, and time. She appears well-developed and well-nourished. She appears distressed (She is uncomfortable).  HENT:  Head: Normocephalic and atraumatic.  Eyes: Conjunctivae and EOM are normal. Pupils are equal, round, and reactive to light.  Neck: Normal range of motion and phonation normal. Neck supple.  Cardiovascular: Normal rate, regular rhythm and intact distal pulses.   Pulmonary/Chest: Effort normal and breath sounds normal. She exhibits no tenderness.  Abdominal: Soft. She exhibits no distension. There is no tenderness. There is no guarding.  Genitourinary:  Right costovertebral angle tenderness, with percussion  Musculoskeletal: Normal range of motion.  Neurological: She is alert and oriented to person, place, and time. She exhibits normal muscle tone.  Skin: Skin is warm and dry.  Psychiatric: She has a normal mood and affect. Her behavior is normal. Judgment and thought content normal.    ED Course  Procedures (including critical care time) Medications  HYDROmorphone (DILAUDID) injection 1 mg (1 mg Intravenous Given 06/19/14 1653)  ondansetron (ZOFRAN) injection 4 mg (4 mg Intravenous Given 06/19/14 1653)  HYDROmorphone (DILAUDID) injection 1 mg (1 mg Intravenous Given 06/19/14 1805)  ketorolac (TORADOL) 30 MG/ML injection 30 mg (30 mg Intravenous Given 06/19/14 1818)  tamsulosin (FLOMAX) capsule 0.4 mg (0.4 mg Oral  Given 06/19/14 1818)  ondansetron (ZOFRAN) injection 4 mg (4 mg Intravenous Given 06/19/14 1919)    Patient Vitals for the past 24 hrs:  BP Temp Temp src Pulse Resp SpO2  06/19/14 1923 170/78 mmHg 97.4 F (36.3 C) Oral 49 16 97 %  06/19/14 1806 185/81 mmHg - - 46 18 96 %  06/19/14 1613 198/95 mmHg 98.2 F (36.8 C) Oral 56 20 99 %    At D/C Reevaluation with update and discussion. After initial assessment and treatment, an updated evaluation reveals Pain is controlled. Findings discussed with pt and husband. Delta  METABOLIC PANEL - Abnormal; Notable for the following:    Glucose, Bld 100 (*)    GFR calc non Af Amer 67 (*)    GFR calc Af Amer 78 (*)    All other components within normal limits  URINALYSIS, ROUTINE W REFLEX MICROSCOPIC - Abnormal; Notable for the following:    APPearance CLOUDY (*)    Hgb urine dipstick LARGE (*)    Leukocytes, UA SMALL (*)    All other components within normal limits  URINE MICROSCOPIC-ADD ON - Abnormal; Notable for the following:    Crystals CA OXALATE CRYSTALS (*)    All other components within normal limits  URINE CULTURE  CBC  LIPASE, BLOOD    Imaging Review Ct Abdomen Pelvis Wo Contrast  06/19/2014   CLINICAL DATA:  Right flank pain.  EXAM: CT ABDOMEN AND PELVIS WITHOUT CONTRAST  TECHNIQUE: Multidetector CT imaging of the abdomen and pelvis was performed following the standard protocol without IV contrast.  COMPARISON:  None.  FINDINGS: Liver normal. Splenic calcification present. This is consistent granuloma. Spleen otherwise normal. Pancreas normal. No biliary distention. Gallbladder nondistended.  Adrenals normal. Two approximately 7 mm stone seen on the left renal collecting system. A 4 mm stone of the distal right ureter. There is bilateral hydronephrosis right side greater than left. Prominent extrarenal pelves are noted. Bilateral UPJ obstructions may be present. Uterus and adnexa  unremarkable. No free pelvic fluid.  No significant adenopathy noted. Aortoiliac atherosclerotic vascular disease present. Mild bilateral iliac artery ectasia is present  Appendix normal. No evidence of bowel obstruction. No free air. No mesenteric mass. No significant hernia.  Heart size normal. Small of fluid noted noted minor fissure. Degenerative changes lumbar spine and both hips.  IMPRESSION: 1. Bilateral hydronephrosis and extrarenal pelves. Hydronephrosis on the right is greater than left. Bilateral UPJ obstructions may be present. 2. 4 mm stone distal right ureter at the ureterovesical junction with mild hydroureter. 3. Nonobstructive left nephrolithiasis.   Electronically Signed   By: Marcello Moores  Register   On: 06/19/2014 17:23     EKG Interpretation None      MDM   Final diagnoses:  Ureteral colic  Right ureteral stone    Small distal ureteral stone, Doubt UTI/sepsis.  Nursing Notes Reviewed/ Care Coordinated Applicable Imaging Reviewed Interpretation of Laboratory Data incorporated into ED treatment  The patient appears reasonably screened and/or stabilized for discharge and I doubt any other medical condition or other [facility] requiring further screening, evaluation, or treatment in the ED at this time prior to discharge.  Plan: Home Medications- Percocet, Flomax, Zofran; Home Treatments- Strain urine; return here if the recommended treatment, does not improve the symptoms; Recommended follow up- Urology 1 week    Richarda Blade, MD 06/20/14 1207

## 2014-06-19 NOTE — ED Notes (Signed)
Pt c/o right flank pain 1 hour, states pain is making her nauseous. No hx of kidney stones.

## 2014-06-19 NOTE — Discharge Instructions (Signed)
Kidney Stones °Kidney stones (urolithiasis) are deposits that form inside your kidneys. The intense pain is caused by the stone moving through the urinary tract. When the stone moves, the ureter goes into spasm around the stone. The stone is usually passed in the urine.  °CAUSES  °· A disorder that makes certain neck glands produce too much parathyroid hormone (primary hyperparathyroidism). °· A buildup of uric acid crystals, similar to gout in your joints. °· Narrowing (stricture) of the ureter. °· A kidney obstruction present at birth (congenital obstruction). °· Previous surgery on the kidney or ureters. °· Numerous kidney infections. °SYMPTOMS  °· Feeling sick to your stomach (nauseous). °· Throwing up (vomiting). °· Blood in the urine (hematuria). °· Pain that usually spreads (radiates) to the groin. °· Frequency or urgency of urination. °DIAGNOSIS  °· Taking a history and physical exam. °· Blood or urine tests. °· CT scan. °· Occasionally, an examination of the inside of the urinary bladder (cystoscopy) is performed. °TREATMENT  °· Observation. °· Increasing your fluid intake. °· Extracorporeal shock wave lithotripsy--This is a noninvasive procedure that uses shock waves to break up kidney stones. °· Surgery may be needed if you have severe pain or persistent obstruction. There are various surgical procedures. Most of the procedures are performed with the use of small instruments. Only small incisions are needed to accommodate these instruments, so recovery time is minimized. °The size, location, and chemical composition are all important variables that will determine the proper choice of action for you. Talk to your health care provider to better understand your situation so that you will minimize the risk of injury to yourself and your kidney.  °HOME CARE INSTRUCTIONS  °· Drink enough water and fluids to keep your urine clear or pale yellow. This will help you to pass the stone or stone fragments. °· Strain  all urine through the provided strainer. Keep all particulate matter and stones for your health care provider to see. The stone causing the pain may be as small as a grain of salt. It is very important to use the strainer each and every time you pass your urine. The collection of your stone will allow your health care provider to analyze it and verify that a stone has actually passed. The stone analysis will often identify what you can do to reduce the incidence of recurrences. °· Only take over-the-counter or prescription medicines for pain, discomfort, or fever as directed by your health care provider. °· Make a follow-up appointment with your health care provider as directed. °· Get follow-up X-rays if required. The absence of pain does not always mean that the stone has passed. It may have only stopped moving. If the urine remains completely obstructed, it can cause loss of kidney function or even complete destruction of the kidney. It is your responsibility to make sure X-rays and follow-ups are completed. Ultrasounds of the kidney can show blockages and the status of the kidney. Ultrasounds are not associated with any radiation and can be performed easily in a matter of minutes. °SEEK MEDICAL CARE IF: °· You experience pain that is progressive and unresponsive to any pain medicine you have been prescribed. °SEEK IMMEDIATE MEDICAL CARE IF:  °· Pain cannot be controlled with the prescribed medicine. °· You have a fever or shaking chills. °· The severity or intensity of pain increases over 18 hours and is not relieved by pain medicine. °· You develop a new onset of abdominal pain. °· You feel faint or pass out. °·   You are unable to urinate. MAKE SURE YOU:   Understand these instructions.  Will watch your condition.  Will get help right away if you are not doing well or get worse. Document Released: 11/07/2005 Document Revised: 07/10/2013 Document Reviewed: 04/10/2013 [facility] Patient Information 2015  [place], [place]. This information is not intended to replace advice given to you by your health care provider. Make sure you discuss any questions you have with your health care provider.  Strain the urine, to catch the stone. Return here, if needed.    Dietary Guidelines to Help Prevent Kidney Stones Your risk of kidney stones can be decreased by adjusting the foods you eat. The most important thing you can do is drink enough fluid. You should drink enough fluid to keep your urine clear or pale yellow. The following guidelines provide specific information for the type of kidney stone you have had. GUIDELINES ACCORDING TO TYPE OF KIDNEY STONE Calcium Oxalate Kidney Stones  Reduce the amount of salt you eat. Foods that have a lot of salt cause your body to release excess calcium into your urine. The excess calcium can combine with a substance called oxalate to form kidney stones.  Reduce the amount of animal protein you eat if the amount you eat is excessive. Animal protein causes your body to release excess calcium into your urine. Ask your dietitian how much protein from animal sources you should be eating.  Avoid foods that are high in oxalates. If you take vitamins, they should have less than 500 mg of vitamin C. Your body turns vitamin C into oxalates. You do not need to avoid fruits and vegetables high in vitamin C. Calcium Phosphate Kidney Stones  Reduce the amount of salt you eat to help prevent the release of excess calcium into your urine.  Reduce the amount of animal protein you eat if the amount you eat is excessive. Animal protein causes your body to release excess calcium into your urine. Ask your dietitian how much protein from animal sources you should be eating.  Get enough calcium from food or take a calcium supplement (ask your dietitian for recommendations). Food sources of calcium that do not increase your risk of kidney stones include:  Broccoli.  Dairy products, such as  cheese and yogurt.  Pudding. Uric Acid Kidney Stones  Do not have more than 6 oz of animal protein per day. FOOD SOURCES Animal Protein Sources  Meat (all types).  Poultry.  Eggs.  Fish, seafood. Foods High in [employer] seasonings.  Soy sauce.  Teriyaki sauce.  Cured and processed meats.  Salted crackers and snack foods.  Fast food.  Canned soups and most canned foods. Foods High in Oxalates  Grains:  Amaranth.  Barley.  Grits.  Wheat germ.  Bran.  Buckwheat flour.  All bran cereals.  Pretzels.  Whole wheat bread.  Vegetables:  Beans (wax).  Beets and beet greens.  Collard greens.  Eggplant.  [place].  Leeks.  Okra.  Parsley.  Rutabagas.  Spinach.  Swiss chard.  Tomato paste.  Fried potatoes.  Sweet potatoes.  Fruits:  Red currants.  Figs.  Kiwi.  Rhubarb.  Meat and Other Protein Sources:  Beans (dried).  Soy burgers and other soybean products.  Miso.  Nuts (peanuts, almonds, pecans, cashews, hazelnuts).  Nut butters.  Sesame seeds and tahini (paste made of sesame seeds).  Poppy seeds.  Beverages:  Chocolate drink mixes.  Soy milk.  Instant iced tea.  Juices made from high-oxalate fruits  or vegetables.  Other:  Carob.  Chocolate.  Fruitcake.  Marmalades. Document Released: 03/04/2011 Document Revised: 11/12/2013 Document Reviewed: 10/04/2013 [facility] Patient Information 2015 [place], [place]. This information is not intended to replace advice given to you by your health care provider. Make sure you discuss any questions you have with your health care provider.

## 2014-06-21 LAB — URINE CULTURE
COLONY COUNT: NO GROWTH
CULTURE: NO GROWTH
Special Requests: NORMAL

## 2014-06-26 DIAGNOSIS — N200 Calculus of kidney: Secondary | ICD-10-CM | POA: Diagnosis not present

## 2014-06-26 DIAGNOSIS — N201 Calculus of ureter: Secondary | ICD-10-CM | POA: Diagnosis not present

## 2014-06-26 DIAGNOSIS — N1330 Unspecified hydronephrosis: Secondary | ICD-10-CM | POA: Diagnosis not present

## 2014-07-18 DIAGNOSIS — N201 Calculus of ureter: Secondary | ICD-10-CM | POA: Diagnosis not present

## 2014-07-18 DIAGNOSIS — N1330 Unspecified hydronephrosis: Secondary | ICD-10-CM | POA: Diagnosis not present

## 2014-07-18 DIAGNOSIS — N200 Calculus of kidney: Secondary | ICD-10-CM | POA: Diagnosis not present

## 2014-08-16 ENCOUNTER — Telehealth: Payer: Self-pay | Admitting: Adult Health

## 2014-08-22 ENCOUNTER — Other Ambulatory Visit: Payer: Medicare Other

## 2014-08-22 ENCOUNTER — Ambulatory Visit: Payer: Medicare Other | Admitting: Oncology

## 2014-08-26 ENCOUNTER — Other Ambulatory Visit: Payer: Medicare Other

## 2014-08-26 ENCOUNTER — Ambulatory Visit (HOSPITAL_BASED_OUTPATIENT_CLINIC_OR_DEPARTMENT_OTHER): Payer: Medicare Other

## 2014-08-26 ENCOUNTER — Ambulatory Visit (HOSPITAL_BASED_OUTPATIENT_CLINIC_OR_DEPARTMENT_OTHER): Payer: Medicare Other | Admitting: Adult Health

## 2014-08-26 ENCOUNTER — Encounter: Payer: Self-pay | Admitting: Adult Health

## 2014-08-26 VITALS — BP 152/87 | HR 55 | Temp 98.1°F | Resp 18 | Ht 65.0 in | Wt 135.2 lb

## 2014-08-26 DIAGNOSIS — C50411 Malignant neoplasm of upper-outer quadrant of right female breast: Secondary | ICD-10-CM

## 2014-08-26 DIAGNOSIS — Z23 Encounter for immunization: Secondary | ICD-10-CM | POA: Diagnosis not present

## 2014-08-26 DIAGNOSIS — Z170 Estrogen receptor positive status [ER+]: Secondary | ICD-10-CM | POA: Diagnosis not present

## 2014-08-26 LAB — CBC WITH DIFFERENTIAL/PLATELET
BASO%: 1 % (ref 0.0–2.0)
BASOS ABS: 0 10*3/uL (ref 0.0–0.1)
EOS%: 3.5 % (ref 0.0–7.0)
Eosinophils Absolute: 0.2 10*3/uL (ref 0.0–0.5)
HCT: 44.4 % (ref 34.8–46.6)
HGB: 14.3 g/dL (ref 11.6–15.9)
LYMPH%: 26.7 % (ref 14.0–49.7)
MCH: 32.7 pg (ref 25.1–34.0)
MCHC: 32.2 g/dL (ref 31.5–36.0)
MCV: 101.6 fL — AB (ref 79.5–101.0)
MONO#: 0.5 10*3/uL (ref 0.1–0.9)
MONO%: 9.8 % (ref 0.0–14.0)
NEUT#: 2.9 10*3/uL (ref 1.5–6.5)
NEUT%: 59 % (ref 38.4–76.8)
Platelets: 225 10*3/uL (ref 145–400)
RBC: 4.37 10*6/uL (ref 3.70–5.45)
RDW: 12.8 % (ref 11.2–14.5)
WBC: 4.9 10*3/uL (ref 3.9–10.3)
lymph#: 1.3 10*3/uL (ref 0.9–3.3)

## 2014-08-26 LAB — COMPREHENSIVE METABOLIC PANEL (CC13)
ALT: 14 U/L (ref 0–55)
ANION GAP: 4 meq/L (ref 3–11)
AST: 19 U/L (ref 5–34)
Albumin: 3.8 g/dL (ref 3.5–5.0)
Alkaline Phosphatase: 55 U/L (ref 40–150)
BILIRUBIN TOTAL: 0.34 mg/dL (ref 0.20–1.20)
BUN: 16.9 mg/dL (ref 7.0–26.0)
CALCIUM: 9.1 mg/dL (ref 8.4–10.4)
CHLORIDE: 108 meq/L (ref 98–109)
CO2: 27 meq/L (ref 22–29)
Creatinine: 0.8 mg/dL (ref 0.6–1.1)
GLUCOSE: 77 mg/dL (ref 70–140)
Potassium: 4.2 mEq/L (ref 3.5–5.1)
Sodium: 139 mEq/L (ref 136–145)
TOTAL PROTEIN: 6.8 g/dL (ref 6.4–8.3)

## 2014-08-26 MED ORDER — INFLUENZA VAC SPLIT QUAD 0.5 ML IM SUSY
0.5000 mL | PREFILLED_SYRINGE | Freq: Once | INTRAMUSCULAR | Status: AC
  Administered 2014-08-26: 0.5 mL via INTRAMUSCULAR
  Filled 2014-08-26: qty 0.5

## 2014-08-26 NOTE — Patient Instructions (Signed)
You are doing well.  You have no sign of recurrence.  Continue taking Tamoxifen daily.  I recommend healthy diet, exercise and monthly breast exams.    Breast Self-Awareness Practicing breast self-awareness may pick up problems early, prevent significant medical complications, and possibly save your life. By practicing breast self-awareness, you can become familiar with how your breasts look and feel and if your breasts are changing. This allows you to notice changes early. It can also offer you some reassurance that your breast health is good. One way to learn what is normal for your breasts and whether your breasts are changing is to do a breast self-exam. If you find a lump or something that was not present in the past, it is best to contact your caregiver right away. Other findings that should be evaluated by your caregiver include nipple discharge, especially if it is bloody; skin changes or reddening; areas where the skin seems to be pulled in (retracted); or new lumps and bumps. Breast pain is seldom associated with cancer (malignancy), but should also be evaluated by a caregiver. HOW TO PERFORM A BREAST SELF-EXAM The best time to examine your breasts is 5-7 days after your menstrual period is over. During menstruation, the breasts are lumpier, and it may be more difficult to pick up changes. If you do not menstruate, have reached menopause, or had your uterus removed (hysterectomy), you should examine your breasts at regular intervals, such as monthly. If you are breastfeeding, examine your breasts after a feeding or after using a breast pump. Breast implants do not decrease the risk for lumps or tumors, so continue to perform breast self-exams as recommended. Talk to your caregiver about how to determine the difference between the implant and breast tissue. Also, talk about the amount of pressure you should use during the exam. Over time, you will become more familiar with the variations of your  breasts and more comfortable with the exam. A breast self-exam requires you to remove all your clothes above the waist. 1. Look at your breasts and nipples. Stand in front of a mirror in a room with good lighting. With your hands on your hips, push your hands firmly downward. Look for a difference in shape, contour, and size from one breast to the other (asymmetry). Asymmetry includes puckers, dips, or bumps. Also, look for skin changes, such as reddened or scaly areas on the breasts. Look for nipple changes, such as discharge, dimpling, repositioning, or redness. 2. Carefully feel your breasts. This is best done either in the shower or tub while using soapy water or when flat on your back. Place the arm (on the side of the breast you are examining) above your head. Use the pads (not the fingertips) of your three middle fingers on your opposite hand to feel your breasts. Start in the underarm area and use  inch (2 cm) overlapping circles to feel your breast. Use 3 different levels of pressure (light, medium, and firm pressure) at each circle before moving to the next circle. The light pressure is needed to feel the tissue closest to the skin. The medium pressure will help to feel breast tissue a little deeper, while the firm pressure is needed to feel the tissue close to the ribs. Continue the overlapping circles, moving downward over the breast until you feel your ribs below your breast. Then, move one finger-width towards the center of the body. Continue to use the  inch (2 cm) overlapping circles to feel your breast as  you move slowly up toward the collar bone (clavicle) near the base of the neck. Continue the up and down exam using all 3 pressures until you reach the middle of the chest. Do this with each breast, carefully feeling for lumps or changes. 3.  Keep a written record with breast changes or normal findings for each breast. By writing this information down, you do not need to depend only on memory  for size, tenderness, or location. Write down where you are in your menstrual cycle, if you are still menstruating. Breast tissue can have some lumps or thick tissue. However, see your caregiver if you find anything that concerns you.  SEEK MEDICAL CARE IF:  You see a change in shape, contour, or size of your breasts or nipples.   You see skin changes, such as reddened or scaly areas on the breasts or nipples.   You have an unusual discharge from your nipples.   You feel a new lump or unusually thick areas.  Document Released: 11/07/2005 Document Revised: 10/24/2012 Document Reviewed: 02/22/2012 [facility] Patient Information 2015 [place], [place]. This information is not intended to replace advice given to you by your health care provider. Make sure you discuss any questions you have with your health care provider.

## 2014-08-26 NOTE — Progress Notes (Signed)
[facility] OFFICE PROGRESS NOTE  Patient Care Team: Hulan Fess, MD as PCP - General (Family Medicine)  Dr. Coralie Keens  Dr. Arloa Koh  DIAGNOSIS: 68 year old female with T1 N0 invasive lobular carcinoma/LCIS of the right breast. Patient is seen in the multidisciplinary breast clinic   STAGE:  Breast cancer of upper-outer quadrant of right female breast  Primary site: Breast (Right)  Staging method: AJCC 7th Edition  Clinical: Stage IA (T1c, N0, cM0)  Summary: Stage IA (T1c, N0, cM0)   SUMMARY OF ONCOLOGIC HISTORY: #1screening mammogram on 04/23/2013 was found to have a suspicious calcifications in the right breast. 05/10/2013 patient had additional views performed that showed suspicious calcifications within the upper outer quadrant. She had a stereotactic core biopsy performed on 05/20/2013. The pathology revealed invasive lobular carcinoma with LCIS. Lymph node was benign. She had tumor that was ER positive PR negative with a Ki-67 of 10%.  #2 patient is status post right lumpectomy with sentinel lymph node biopsy performed on 06/11/2013. Her final pathology revealed focal lobular carcinoma in situ. No invasive disease was found. Sentinel nodes were negative for metastatic disease. Postoperatively she is doing well.  #3 status post postlumpectomy radiation therapy administered 08/06/2013 through 09/04/2013 administered by Dr. Arloa Koh. Overall she tolerated it well.  #4 begin antiestrogen therapy adjuvantly with curative intent consisting of tamoxifen 20 mg daily for a total of 5-10 years. Risks benefits side effects of treatment were discussed with the patient. Begin 10/03/2013.   CURRENT THERAPY: Curative intent Tamoxifen 20 mg daily beginning 10/03/2013   INTERVAL HISTORY: STEVEY STAPLETON 68 y.o. female returns for followup visit. She has been on tamoxifen since November 2014.  She is taking it daily and tolerating it without any side effects  whatsoever.  She denies any new pain, vision changes, headaches, weakness, numbness, or any further concerns.  We updated her health maintenance below.    ALLERGIES:  is allergic to amoxicillin and terak.  MEDICATIONS:  Current Outpatient Prescriptions  Medication Sig Dispense Refill  . aspirin EC 81 MG tablet Take 81 mg by mouth every morning.       Marland Kitchen atorvastatin (LIPITOR) 10 MG tablet Take 10 mg by mouth every evening.       Marland Kitchen buPROPion (WELLBUTRIN XL) 300 MG 24 hr tablet Take 300 mg by mouth daily.      . fluticasone (FLONASE) 50 MCG/ACT nasal spray Place 2 sprays into the nose daily.      Marland Kitchen GLUCOSAMINE-CHONDROITIN PO Take 2 tablets by mouth every morning.       Marland Kitchen ibuprofen (ADVIL) 200 MG tablet Take 400 mg by mouth every 6 (six) hours as needed for pain.       . Melatonin 5 MG TABS Take 5 mg by mouth at bedtime.      . meloxicam (MOBIC) 15 MG tablet Take 15 mg by mouth daily.      . Probiotic Product (PROBIOTIC DAILY PO) Take 1 tablet by mouth daily.      Marland Kitchen SYNTHROID 100 MCG tablet Take 100 mcg by mouth every other day.       Marland Kitchen SYNTHROID 88 MCG tablet Take 88 mcg by mouth every other day.       . tamoxifen (NOLVADEX) 20 MG tablet Take 20 mg by mouth daily.      . TURMERIC PO Take 1 capsule by mouth daily.      . Echinacea 125 MG CAPS Take 125 mg by mouth as needed (  Cold prophylaxis).       Marland Kitchen zolpidem (AMBIEN) 10 MG tablet Take 10 mg by mouth at bedtime as needed for sleep.       No current facility-administered medications for this visit.    REVIEW OF SYSTEMS:   A 10 point review of systems was conducted and is otherwise negative except for what is noted above.    Health Maintenance  Mammogram: 04/24/2014, increased breast density, will consider MRI in June, 2016 Colonoscopy: 03/05/2012 with 5 year f/u recommended Bone Density Scan: 2012 normal Pap Smear: several years ago, still has gynecologic exams Eye Exam: 02/2014 Vitamin D Level: not checked recently, checked years ago and  normal Lipid Panel:  08/2014   PHYSICAL EXAMINATION: ECOG PERFORMANCE STATUS: 0 - Asymptomatic  Filed Vitals:   08/26/14 1508  BP: 152/87  Pulse: 55  Temp: 98.1 F (36.7 C)  Resp: 18   Filed Weights   08/26/14 1508  Weight: 135 lb 3.2 oz (61.326 kg)   GENERAL: Patient is a well appearing female in no acute distress HEENT:  Sclerae anicteric.  Oropharynx clear and moist. No ulcerations or evidence of oropharyngeal candidiasis. Neck is supple.  NODES:  No cervical, supraclavicular, or axillary lymphadenopathy palpated.  BREAST EXAM:  S/p right lumpectomy.  No nodularity, no masses, left breast no nodules or masses.  No skin or nipple change, benign bilateral breast exam LUNGS:  Clear to auscultation bilaterally.  No wheezes or rhonchi. HEART:  Regular rate and rhythm. No murmur appreciated. ABDOMEN:  Soft, nontender.  Positive, normoactive bowel sounds. No organomegaly palpated. MSK:  No focal spinal tenderness to palpation. Full range of motion bilaterally in the upper extremities. EXTREMITIES:  No peripheral edema.   SKIN:  Clear with no obvious rashes or skin changes. No nail dyscrasia. NEURO:  Nonfocal. Well oriented.  Appropriate affect.    LABORATORY DATA:  I have reviewed the data as listed    Component Value Date/Time   NA 137 06/19/2014 1630   NA 143 02/12/2014 1516   K 3.7 06/19/2014 1630   K 4.0 02/12/2014 1516   CL 100 06/19/2014 1630   CO2 23 06/19/2014 1630   CO2 24 02/12/2014 1516   GLUCOSE 100* 06/19/2014 1630   GLUCOSE 103 02/12/2014 1516   BUN 14 06/19/2014 1630   BUN 14.7 02/12/2014 1516   CREATININE 0.87 06/19/2014 1630   CREATININE 0.8 02/12/2014 1516   CALCIUM 9.1 06/19/2014 1630   CALCIUM 9.2 02/12/2014 1516   PROT 7.1 06/19/2014 1630   PROT 6.6 02/12/2014 1516   ALBUMIN 4.0 06/19/2014 1630   ALBUMIN 3.8 02/12/2014 1516   AST 21 06/19/2014 1630   AST 20 02/12/2014 1516   ALT 15 06/19/2014 1630   ALT 13 02/12/2014 1516   ALKPHOS 52 06/19/2014 1630   ALKPHOS 64  02/12/2014 1516   BILITOT 0.3 06/19/2014 1630   BILITOT 0.41 02/12/2014 1516   GFRNONAA 67* 06/19/2014 1630   GFRAA 78* 06/19/2014 1630    No results found for this basename: SPEP,  UPEP,   kappa and lambda light chains    Lab Results  Component Value Date   WBC 5.5 06/19/2014   NEUTROABS 2.8 02/12/2014   HGB 14.3 06/19/2014   HCT 42.9 06/19/2014   MCV 98.2 06/19/2014   PLT 217 06/19/2014      Chemistry      Component Value Date/Time   NA 137 06/19/2014 1630   NA 143 02/12/2014 1516   K 3.7 06/19/2014 1630  K 4.0 02/12/2014 1516   CL 100 06/19/2014 1630   CO2 23 06/19/2014 1630   CO2 24 02/12/2014 1516   BUN 14 06/19/2014 1630   BUN 14.7 02/12/2014 1516   CREATININE 0.87 06/19/2014 1630   CREATININE 0.8 02/12/2014 1516      Component Value Date/Time   CALCIUM 9.1 06/19/2014 1630   CALCIUM 9.2 02/12/2014 1516   ALKPHOS 52 06/19/2014 1630   ALKPHOS 64 02/12/2014 1516   AST 21 06/19/2014 1630   AST 20 02/12/2014 1516   ALT 15 06/19/2014 1630   ALT 13 02/12/2014 1516   BILITOT 0.3 06/19/2014 1630   BILITOT 0.41 02/12/2014 1516       RADIOGRAPHIC STUDIES: I have personally reviewed the radiological images as listed and agreed with the findings in the report. No results found.    ASSESSMENT & PLAN:  68 year old female with  #1 invasive lobular carcinoma of the right breast (stage I T1 N0) and lobular carcinoma in situ. Patient is status post lumpectomy with sentinel lymph node biopsy. The tumor was ER positive PR positive HER-2/neu negative. Sentinel nodes were negative for metastatic disease. She received adjuvant radiation therapy completed 09/04/2013. She was then begun on curative intent tamoxifen 20 mg daily starting November 2013.  PLAN:  [name] is doing well today.  She has no sign of recurrence.  She is taking Tamoxifen daily and continues to tolerate it well.  She will continue this.  I recommended healthy diet and exercise.  I recommended monthly breast exams.  Due to her  increased breast density, she will consider whether or not to have a screening MRI in June when her next mammogram is due.  Her health maintenance is otherwise up to date.    Harryette will return in 6 months for follow up with Dr. Lindi Adie.     The total time spent in the appointment was 30 minutes and more than 50% was on counseling and review of test results and coordination of care.    Minette Headland, [place] [phone] 08/26/2014 3:29 PM

## 2014-09-04 DIAGNOSIS — Z808 Family history of malignant neoplasm of other organs or systems: Secondary | ICD-10-CM | POA: Diagnosis not present

## 2014-09-04 DIAGNOSIS — L821 Other seborrheic keratosis: Secondary | ICD-10-CM | POA: Diagnosis not present

## 2014-09-04 DIAGNOSIS — L570 Actinic keratosis: Secondary | ICD-10-CM | POA: Diagnosis not present

## 2014-09-04 DIAGNOSIS — D239 Other benign neoplasm of skin, unspecified: Secondary | ICD-10-CM | POA: Diagnosis not present

## 2014-09-04 DIAGNOSIS — L810 Postinflammatory hyperpigmentation: Secondary | ICD-10-CM | POA: Diagnosis not present

## 2014-09-19 DIAGNOSIS — E780 Pure hypercholesterolemia: Secondary | ICD-10-CM | POA: Diagnosis not present

## 2014-09-19 DIAGNOSIS — F329 Major depressive disorder, single episode, unspecified: Secondary | ICD-10-CM | POA: Diagnosis not present

## 2014-09-19 DIAGNOSIS — E039 Hypothyroidism, unspecified: Secondary | ICD-10-CM | POA: Diagnosis not present

## 2014-09-19 DIAGNOSIS — Z79899 Other long term (current) drug therapy: Secondary | ICD-10-CM | POA: Diagnosis not present

## 2014-09-19 DIAGNOSIS — C50919 Malignant neoplasm of unspecified site of unspecified female breast: Secondary | ICD-10-CM | POA: Diagnosis not present

## 2014-09-23 DIAGNOSIS — R312 Other microscopic hematuria: Secondary | ICD-10-CM | POA: Diagnosis not present

## 2014-09-23 DIAGNOSIS — N200 Calculus of kidney: Secondary | ICD-10-CM | POA: Diagnosis not present

## 2014-09-23 DIAGNOSIS — N201 Calculus of ureter: Secondary | ICD-10-CM | POA: Diagnosis not present

## 2014-10-06 DIAGNOSIS — R310 Gross hematuria: Secondary | ICD-10-CM | POA: Diagnosis not present

## 2014-10-06 DIAGNOSIS — N200 Calculus of kidney: Secondary | ICD-10-CM | POA: Diagnosis not present

## 2014-10-06 DIAGNOSIS — R312 Other microscopic hematuria: Secondary | ICD-10-CM | POA: Diagnosis not present

## 2014-10-07 ENCOUNTER — Other Ambulatory Visit: Payer: Self-pay | Admitting: Oncology

## 2014-10-07 DIAGNOSIS — C50411 Malignant neoplasm of upper-outer quadrant of right female breast: Secondary | ICD-10-CM

## 2014-10-08 MED ORDER — FENTANYL CITRATE 0.05 MG/ML IJ SOLN
INTRAMUSCULAR | Status: AC
  Filled 2014-10-08: qty 5

## 2014-10-08 MED ORDER — ETOMIDATE 2 MG/ML IV SOLN
INTRAVENOUS | Status: AC
  Filled 2014-10-08: qty 10

## 2014-10-08 MED ORDER — DEXAMETHASONE SODIUM PHOSPHATE 10 MG/ML IJ SOLN
INTRAMUSCULAR | Status: AC
  Filled 2014-10-08: qty 1

## 2014-10-08 MED ORDER — PHENYLEPHRINE HCL 10 MG/ML IJ SOLN
INTRAMUSCULAR | Status: AC
  Filled 2014-10-08: qty 1

## 2014-10-08 MED ORDER — ROCURONIUM BROMIDE 100 MG/10ML IV SOLN
INTRAVENOUS | Status: AC
  Filled 2014-10-08: qty 1

## 2014-10-08 MED ORDER — LIDOCAINE HCL (CARDIAC) 20 MG/ML IV SOLN
INTRAVENOUS | Status: AC
  Filled 2014-10-08: qty 5

## 2014-10-08 MED ORDER — ONDANSETRON HCL 4 MG/2ML IJ SOLN
INTRAMUSCULAR | Status: AC
  Filled 2014-10-08: qty 2

## 2014-10-14 DIAGNOSIS — R310 Gross hematuria: Secondary | ICD-10-CM | POA: Diagnosis not present

## 2014-10-14 DIAGNOSIS — N200 Calculus of kidney: Secondary | ICD-10-CM | POA: Diagnosis not present

## 2014-10-31 DIAGNOSIS — C50919 Malignant neoplasm of unspecified site of unspecified female breast: Secondary | ICD-10-CM | POA: Diagnosis not present

## 2014-12-16 ENCOUNTER — Telehealth: Payer: Self-pay | Admitting: Hematology and Oncology

## 2014-12-16 NOTE — Telephone Encounter (Signed)
due to call day 4/14 appt moved to 4/13. left message for pt and mailed schedule.

## 2015-01-03 DIAGNOSIS — J0100 Acute maxillary sinusitis, unspecified: Secondary | ICD-10-CM | POA: Diagnosis not present

## 2015-02-05 DIAGNOSIS — H04123 Dry eye syndrome of bilateral lacrimal glands: Secondary | ICD-10-CM | POA: Diagnosis not present

## 2015-03-04 ENCOUNTER — Ambulatory Visit (HOSPITAL_BASED_OUTPATIENT_CLINIC_OR_DEPARTMENT_OTHER): Payer: Medicare Other | Admitting: Hematology and Oncology

## 2015-03-04 ENCOUNTER — Other Ambulatory Visit: Payer: Self-pay | Admitting: *Deleted

## 2015-03-04 ENCOUNTER — Telehealth: Payer: Self-pay | Admitting: Hematology and Oncology

## 2015-03-04 ENCOUNTER — Other Ambulatory Visit (HOSPITAL_BASED_OUTPATIENT_CLINIC_OR_DEPARTMENT_OTHER): Payer: Medicare Other

## 2015-03-04 VITALS — BP 156/75 | HR 60 | Temp 97.5°F | Resp 18 | Ht 65.0 in | Wt 138.6 lb

## 2015-03-04 DIAGNOSIS — C50419 Malignant neoplasm of upper-outer quadrant of unspecified female breast: Secondary | ICD-10-CM | POA: Diagnosis not present

## 2015-03-04 DIAGNOSIS — Z170 Estrogen receptor positive status [ER+]: Secondary | ICD-10-CM | POA: Diagnosis not present

## 2015-03-04 DIAGNOSIS — C50411 Malignant neoplasm of upper-outer quadrant of right female breast: Secondary | ICD-10-CM

## 2015-03-04 LAB — CBC WITH DIFFERENTIAL/PLATELET
BASO%: 0.7 % (ref 0.0–2.0)
Basophils Absolute: 0 10*3/uL (ref 0.0–0.1)
EOS%: 2.3 % (ref 0.0–7.0)
Eosinophils Absolute: 0.1 10*3/uL (ref 0.0–0.5)
HEMATOCRIT: 43.6 % (ref 34.8–46.6)
HGB: 13.9 g/dL (ref 11.6–15.9)
LYMPH#: 0.8 10*3/uL — AB (ref 0.9–3.3)
LYMPH%: 19.9 % (ref 14.0–49.7)
MCH: 32 pg (ref 25.1–34.0)
MCHC: 31.8 g/dL (ref 31.5–36.0)
MCV: 100.5 fL (ref 79.5–101.0)
MONO#: 0.4 10*3/uL (ref 0.1–0.9)
MONO%: 9.2 % (ref 0.0–14.0)
NEUT#: 2.8 10*3/uL (ref 1.5–6.5)
NEUT%: 67.9 % (ref 38.4–76.8)
Platelets: 193 10*3/uL (ref 145–400)
RBC: 4.34 10*6/uL (ref 3.70–5.45)
RDW: 13.3 % (ref 11.2–14.5)
WBC: 4.1 10*3/uL (ref 3.9–10.3)

## 2015-03-04 LAB — COMPREHENSIVE METABOLIC PANEL
ALK PHOS: 40 U/L (ref 39–117)
ALT: 14 U/L (ref 0–35)
AST: 22 U/L (ref 0–37)
Albumin: 3.9 g/dL (ref 3.5–5.2)
BILIRUBIN TOTAL: 0.5 mg/dL (ref 0.2–1.2)
BUN: 14 mg/dL (ref 6–23)
CO2: 26 mEq/L (ref 19–32)
CREATININE: 0.81 mg/dL (ref 0.50–1.10)
Calcium: 9.1 mg/dL (ref 8.4–10.5)
Chloride: 106 mEq/L (ref 96–112)
GLUCOSE: 61 mg/dL — AB (ref 70–99)
Potassium: 4.4 mEq/L (ref 3.5–5.3)
Sodium: 140 mEq/L (ref 135–145)
Total Protein: 6.3 g/dL (ref 6.0–8.3)

## 2015-03-04 NOTE — Assessment & Plan Note (Signed)
Right breast invasive lobular cancer with LCIS T1 N0 M0 stage IA status post lumpectomy which did not show any further evidence of invasive breast cancer. ER positive PR positive HER-2 negative, received adjuvant radiation completed 09/04/2013, started her tamoxifen 20 mg daily from November 2013  Tamoxifen toxicities: Patient denies any hot flashes. She does have myalgias but it may not be related to tamoxifen.  Breast cancer surveillance: 1. Breast exam 03/21/2015 is normal 2. Mammogram done in June 2015 is normal 3. For 2016, we will obtain a mammogram and along with that we will get a breast MRI to be done every other year.  Return to clinic in 6 months for follow-up I will call her with the results of the MRI  Return to clinic after the mammograms and MRIs for follow-up.

## 2015-03-04 NOTE — Telephone Encounter (Signed)
appointments made and avs printed for patient °

## 2015-03-04 NOTE — Progress Notes (Signed)
Patient Care Team: Hulan Fess, MD as PCP - General (Family Medicine)  DIAGNOSIS: Breast cancer of upper-outer quadrant of right female breast   Staging form: Breast, AJCC 7th Edition     Clinical: Stage IA (T1c, N0, cM0) - Unsigned       Staging comments: Staged at breast conference 7.9.14      Pathologic: No stage assigned - Unsigned   SUMMARY OF ONCOLOGIC HISTORY:   Breast cancer of upper-outer quadrant of right female breast   04/23/2013 Initial Diagnosis Right breast calcifications stereotactic biopsy revealed invasive lobular cancer with LCIS, lymph node benign, ER positive PR negative Ki-67 10%   06/11/2013 Surgery Right breast lumpectomy: Lobular carcinoma in situ, no invasive disease found, sentinel nodes negative   08/06/2013 - 09/04/2013 Radiation Therapy Adjuvant radiation therapy   10/03/2013 -  Anti-estrogen oral therapy Tamoxifen 20 mg daily    CHIEF COMPLIANT: Follow-up of breast cancer  INTERVAL HISTORY: [name] is a 69 year old with above-mentioned history of right breast cancer treated with lumpectomy and radiation and currently on tamoxifen since November 2014. She reports no major problems or concerns denies any lumps or nodules in the breasts. Her husband works currently part-time as a Air cabin crew.  [place]:   Constitutional: Denies fevers, chills or abnormal weight loss Eyes: Denies blurriness of vision Ears, nose, mouth, throat, and face: Denies mucositis or sore throat Respiratory: Denies cough, dyspnea or wheezes Cardiovascular: Denies palpitation, chest discomfort or lower extremity swelling Gastrointestinal:  Denies nausea, heartburn or change in bowel habits Skin: Denies abnormal skin rashes Lymphatics: Denies new lymphadenopathy or easy bruising Neurological:Denies numbness, tingling or new weaknesses Behavioral/Psych: Mood is stable, no new changes  Breast:  denies any pain or lumps or nodules in either breasts All other  systems were reviewed with the patient and are negative.  I have reviewed the past medical history, past surgical history, social history and family history with the patient and they are unchanged from previous note.  ALLERGIES:  is allergic to amoxicillin and terak.  MEDICATIONS:  Current Outpatient Prescriptions  Medication Sig Dispense Refill  . aspirin EC 81 MG tablet Take 81 mg by mouth every morning.     Marland Kitchen atorvastatin (LIPITOR) 10 MG tablet Take 10 mg by mouth every evening.     Marland Kitchen buPROPion (WELLBUTRIN XL) 300 MG 24 hr tablet Take 300 mg by mouth daily.    . Echinacea 125 MG CAPS Take 125 mg by mouth as needed (Cold prophylaxis).     . fluticasone (FLONASE) 50 MCG/ACT nasal spray Place 2 sprays into the nose daily.    Marland Kitchen GLUCOSAMINE-CHONDROITIN PO Take 2 tablets by mouth every morning.     Marland Kitchen ibuprofen (ADVIL) 200 MG tablet Take 400 mg by mouth every 6 (six) hours as needed for pain.     . Melatonin 5 MG TABS Take 5 mg by mouth at bedtime.    . meloxicam (MOBIC) 15 MG tablet Take 15 mg by mouth daily.    . Probiotic Product (PROBIOTIC DAILY PO) Take 1 tablet by mouth daily.    Marland Kitchen SYNTHROID 100 MCG tablet Take 100 mcg by mouth every other day.     Marland Kitchen SYNTHROID 88 MCG tablet Take 88 mcg by mouth every other day.     . tamoxifen (NOLVADEX) 20 MG tablet TAKE 1 TABLET ONCE DAILY. 90 tablet 1  . TURMERIC PO Take 1 capsule by mouth daily.    Marland Kitchen zolpidem (AMBIEN) 10 MG tablet Take 10  mg by mouth at bedtime as needed for sleep.     No current facility-administered medications for this visit.    PHYSICAL EXAMINATION: ECOG PERFORMANCE STATUS: 1 - Symptomatic but completely ambulatory  Filed Vitals:   03/04/15 1131  BP: 156/75  Pulse: 60  Temp: 97.5 F (36.4 C)  Resp: 18   Filed Weights   03/04/15 1131  Weight: 138 lb 9.6 oz (62.869 kg)    GENERAL:alert, no distress and comfortable SKIN: skin color, texture, turgor are normal, no rashes or significant lesions EYES: normal,  Conjunctiva are pink and non-injected, sclera clear OROPHARYNX:no exudate, no erythema and lips, buccal mucosa, and tongue normal  NECK: supple, thyroid normal size, non-tender, without nodularity LYMPH:  no palpable lymphadenopathy in the cervical, axillary or inguinal LUNGS: clear to auscultation and percussion with normal breathing effort HEART: regular rate & rhythm and no murmurs and no lower extremity edema ABDOMEN:abdomen soft, non-tender and normal bowel sounds Musculoskeletal:no cyanosis of digits and no clubbing  NEURO: alert & oriented x 3 with fluent speech, no focal motor/sensory deficits BREAST: No palpable masses or nodules in either right or left breasts. No palpable axillary supraclavicular or infraclavicular adenopathy no breast tenderness or nipple discharge. (exam performed in the presence of a chaperone)  LABORATORY DATA:  I have reviewed the data as listed   Chemistry      Component Value Date/Time   NA 139 08/26/2014 1615   NA 137 06/19/2014 1630   K 4.2 08/26/2014 1615   K 3.7 06/19/2014 1630   CL 100 06/19/2014 1630   CO2 27 08/26/2014 1615   CO2 23 06/19/2014 1630   BUN 16.9 08/26/2014 1615   BUN 14 06/19/2014 1630   CREATININE 0.8 08/26/2014 1615   CREATININE 0.87 06/19/2014 1630      Component Value Date/Time   CALCIUM 9.1 08/26/2014 1615   CALCIUM 9.1 06/19/2014 1630   ALKPHOS 55 08/26/2014 1615   ALKPHOS 52 06/19/2014 1630   AST 19 08/26/2014 1615   AST 21 06/19/2014 1630   ALT 14 08/26/2014 1615   ALT 15 06/19/2014 1630   BILITOT 0.34 08/26/2014 1615   BILITOT 0.3 06/19/2014 1630       Lab Results  Component Value Date   WBC 4.9 08/26/2014   HGB 14.3 08/26/2014   HCT 44.4 08/26/2014   MCV 101.6* 08/26/2014   PLT 225 08/26/2014   NEUTROABS 2.9 08/26/2014    ASSESSMENT & PLAN:  Breast cancer of upper-outer quadrant of right female breast Right breast invasive lobular cancer with LCIS T1 N0 M0 stage IA status post lumpectomy which did  not show any further evidence of invasive breast cancer. ER positive PR positive HER-2 negative, received adjuvant radiation completed 09/04/2013, started her tamoxifen 20 mg daily from November 2013  Tamoxifen toxicities: Patient denies any hot flashes. She does have myalgias but it may not be related to tamoxifen.  Breast cancer surveillance: 1. Breast exam 03/21/2015 is normal 2. Mammogram done in June 2015 is normal 3. For 2016, we will obtain a mammogram and along with that we will get a breast MRI to be done every other year.  Return to clinic in 6 months for follow-up I will call her with the results of the MRI  Return to clinic after the mammograms and MRIs for follow-up.     Orders Placed This Encounter  Procedures  . MR Breast Bilateral Wo Contrast    Standing Status: Future     Number of Occurrences:  Standing Expiration Date: 03/03/2016    Order Specific Question:  Reason for Exam (SYMPTOM  OR DIAGNOSIS REQUIRED)    Answer:  Lobular carcinoma with LCIS    Order Specific Question:  Preferred imaging location?    Answer:  [facility]    Order Specific Question:  Does the patient have a pacemaker or implanted devices?    Answer:  No    Order Specific Question:  What is the patient's sedation requirement?    Answer:  No Sedation  . MR Breast Bilateral W Contrast    Standing Status: Future     Number of Occurrences:      Standing Expiration Date: 03/03/2016    Order Specific Question:  Reason for Exam (SYMPTOM  OR DIAGNOSIS REQUIRED)    Answer:  Lobular carcinoma with LCIS    Order Specific Question:  Preferred imaging location?    Answer:  [facility]    Order Specific Question:  Does the patient have a pacemaker or implanted devices?    Answer:  No    Order Specific Question:  What is the patient's sedation requirement?    Answer:  No Sedation   The patient has a good understanding of the overall plan. she agrees with it. She will call with any  problems that may develop before her next visit here.   Rulon Eisenmenger, MD

## 2015-03-05 ENCOUNTER — Ambulatory Visit: Payer: Medicare Other

## 2015-03-05 ENCOUNTER — Ambulatory Visit: Payer: Medicare Other | Admitting: Hematology and Oncology

## 2015-03-05 ENCOUNTER — Other Ambulatory Visit: Payer: Medicare Other

## 2015-03-06 DIAGNOSIS — R312 Other microscopic hematuria: Secondary | ICD-10-CM | POA: Diagnosis not present

## 2015-03-06 DIAGNOSIS — N200 Calculus of kidney: Secondary | ICD-10-CM | POA: Diagnosis not present

## 2015-04-02 ENCOUNTER — Encounter: Payer: Self-pay | Admitting: Hematology and Oncology

## 2015-04-02 ENCOUNTER — Other Ambulatory Visit: Payer: Self-pay | Admitting: Hematology and Oncology

## 2015-04-02 ENCOUNTER — Other Ambulatory Visit: Payer: Self-pay | Admitting: *Deleted

## 2015-04-02 DIAGNOSIS — C50411 Malignant neoplasm of upper-outer quadrant of right female breast: Secondary | ICD-10-CM

## 2015-04-03 ENCOUNTER — Other Ambulatory Visit: Payer: Self-pay | Admitting: Hematology and Oncology

## 2015-04-03 DIAGNOSIS — M545 Low back pain: Secondary | ICD-10-CM | POA: Diagnosis not present

## 2015-04-03 DIAGNOSIS — Z853 Personal history of malignant neoplasm of breast: Secondary | ICD-10-CM

## 2015-04-03 DIAGNOSIS — M25551 Pain in right hip: Secondary | ICD-10-CM | POA: Diagnosis not present

## 2015-04-03 DIAGNOSIS — Z9889 Other specified postprocedural states: Secondary | ICD-10-CM

## 2015-04-28 ENCOUNTER — Ambulatory Visit
Admission: RE | Admit: 2015-04-28 | Discharge: 2015-04-28 | Disposition: A | Discharge status: Home / Self Care | Payer: Medicare Other | Source: Ambulatory Visit | Attending: Hematology and Oncology | Admitting: Hematology and Oncology

## 2015-04-28 DIAGNOSIS — Z9889 Other specified postprocedural states: Secondary | ICD-10-CM

## 2015-04-28 DIAGNOSIS — R928 Other abnormal and inconclusive findings on diagnostic imaging of breast: Secondary | ICD-10-CM | POA: Diagnosis not present

## 2015-04-28 DIAGNOSIS — Z853 Personal history of malignant neoplasm of breast: Secondary | ICD-10-CM | POA: Diagnosis not present

## 2015-05-12 DIAGNOSIS — M170 Bilateral primary osteoarthritis of knee: Secondary | ICD-10-CM | POA: Diagnosis not present

## 2015-06-19 DIAGNOSIS — M170 Bilateral primary osteoarthritis of knee: Secondary | ICD-10-CM | POA: Diagnosis not present

## 2015-06-22 ENCOUNTER — Other Ambulatory Visit: Payer: Self-pay | Admitting: Orthopaedic Surgery

## 2015-06-22 DIAGNOSIS — M545 Low back pain: Secondary | ICD-10-CM

## 2015-06-24 DIAGNOSIS — M170 Bilateral primary osteoarthritis of knee: Secondary | ICD-10-CM | POA: Diagnosis not present

## 2015-07-03 ENCOUNTER — Ambulatory Visit
Admission: RE | Admit: 2015-07-03 | Discharge: 2015-07-03 | Disposition: A | Discharge status: Home / Self Care | Payer: Medicare Other | Source: Ambulatory Visit | Attending: Orthopaedic Surgery | Admitting: Orthopaedic Surgery

## 2015-07-03 DIAGNOSIS — M170 Bilateral primary osteoarthritis of knee: Secondary | ICD-10-CM | POA: Diagnosis not present

## 2015-07-03 DIAGNOSIS — M5136 Other intervertebral disc degeneration, lumbar region: Secondary | ICD-10-CM | POA: Diagnosis not present

## 2015-07-03 DIAGNOSIS — M545 Low back pain: Secondary | ICD-10-CM

## 2015-07-03 DIAGNOSIS — M47817 Spondylosis without myelopathy or radiculopathy, lumbosacral region: Secondary | ICD-10-CM | POA: Diagnosis not present

## 2015-07-03 DIAGNOSIS — Z853 Personal history of malignant neoplasm of breast: Secondary | ICD-10-CM | POA: Diagnosis not present

## 2015-07-03 DIAGNOSIS — M4806 Spinal stenosis, lumbar region: Secondary | ICD-10-CM | POA: Diagnosis not present

## 2015-07-10 DIAGNOSIS — M170 Bilateral primary osteoarthritis of knee: Secondary | ICD-10-CM | POA: Diagnosis not present

## 2015-07-13 ENCOUNTER — Encounter: Payer: Self-pay | Admitting: Nurse Practitioner

## 2015-07-17 DIAGNOSIS — M170 Bilateral primary osteoarthritis of knee: Secondary | ICD-10-CM | POA: Diagnosis not present

## 2015-07-20 DIAGNOSIS — M4806 Spinal stenosis, lumbar region: Secondary | ICD-10-CM | POA: Diagnosis not present

## 2015-07-30 ENCOUNTER — Encounter: Payer: Self-pay | Admitting: *Deleted

## 2015-07-31 ENCOUNTER — Ambulatory Visit (INDEPENDENT_AMBULATORY_CARE_PROVIDER_SITE_OTHER): Payer: Medicare Other | Admitting: Nurse Practitioner

## 2015-07-31 ENCOUNTER — Encounter: Payer: Self-pay | Admitting: Nurse Practitioner

## 2015-07-31 VITALS — BP 130/80 | HR 72 | Ht 65.0 in | Wt 134.6 lb

## 2015-07-31 DIAGNOSIS — K648 Other hemorrhoids: Secondary | ICD-10-CM

## 2015-07-31 DIAGNOSIS — K625 Hemorrhage of anus and rectum: Secondary | ICD-10-CM

## 2015-07-31 MED ORDER — HYDROCORTISONE ACETATE 25 MG RE SUPP: RECTAL | Status: DC

## 2015-07-31 NOTE — Progress Notes (Signed)
HPI :   Patient is 69 year old female known to Dr. Carlean Purl. She has a history of adenomatous colon polyps. Her last surveillance exam was April 2013 with findings of a small ascending colon polyp (adenomatous), moderate diverticulosis throughout the colon.   Patient comes in today for evaluation of painless rectal bleeding. She had an episode of rectal bleeding with a bowel movement 4 weeks ago. She had a second episode 3 weeks ago. Blood was dripping into the toilet. No bleeding in 3 weeks. Bleeding was in the absence of hard stool/constipation. Patient does suffer with intermittent constipation however. She uses MiraLAX as needed  Past Medical History  Diagnosis Date  . Diverticulosis   . Adenomatous colon polyp 03/07/2012  . Arthritis   . Allergic rhinitis   . HLD (hyperlipidemia)   . IBS (irritable bowel syndrome)   . Graves' disease     2011  . Breast cancer 05/20/13    right  . Hx of radiation therapy 08/13/13- 09/06/13    right breast 4250 cGy 17 sessions    Family History  Problem Relation Age of Onset  . Colon cancer Maternal Grandfather   . Heart disease Mother     MI  . Lung cancer Mother   . Hypertension Mother   . Diabetes Paternal Grandmother   . Alcohol abuse Father   . COPD Father   . Breast cancer Maternal Aunt   . Pancreatic cancer Maternal Aunt    Social History  Substance Use Topics  . Smoking status: Never Smoker   . Smokeless tobacco: Never Used  . Alcohol Use: 0.0 oz/week    0 Standard drinks or equivalent per week     Comment: occasional   Current Outpatient Prescriptions  Medication Sig Dispense Refill  . aspirin EC 81 MG tablet Take 81 mg by mouth every morning.     Marland Kitchen atorvastatin (LIPITOR) 10 MG tablet Take 10 mg by mouth every evening.     Marland Kitchen buPROPion (WELLBUTRIN XL) 300 MG 24 hr tablet Take 300 mg by mouth daily.    . Echinacea 125 MG CAPS Take 125 mg by mouth as needed (Cold prophylaxis).     . fluticasone (FLONASE) 50 MCG/ACT nasal  spray Place 2 sprays into the nose daily.    Marland Kitchen GLUCOSAMINE-CHONDROITIN PO Take 2 tablets by mouth every morning.     Marland Kitchen ibuprofen (ADVIL) 200 MG tablet Take 400 mg by mouth every 6 (six) hours as needed for pain.     . Melatonin 5 MG TABS Take 5 mg by mouth at bedtime.    . meloxicam (MOBIC) 15 MG tablet Take 15 mg by mouth daily.    . Probiotic Product (PROBIOTIC DAILY PO) Take 1 tablet by mouth daily.    Marland Kitchen SYNTHROID 100 MCG tablet Take 100 mcg by mouth every other day.     Marland Kitchen SYNTHROID 88 MCG tablet Take 88 mcg by mouth every other day.     . tamoxifen (NOLVADEX) 20 MG tablet TAKE 1 TABLET ONCE DAILY. 90 tablet 3  . TURMERIC PO Take 1 capsule by mouth daily.    Marland Kitchen zolpidem (AMBIEN) 10 MG tablet Take 10 mg by mouth at bedtime as needed for sleep.     No current facility-administered medications for this visit.   Allergies  Allergen Reactions  . Amoxicillin Diarrhea    diarrhea  . Terak [Terramycin] Rash     Review of Systems: All systems reviewed and negative except where noted in HPI.  Physical Exam: BP 130/80 mmHg  Pulse 72  Ht 5\' 5"  (1.651 m)  Wt 134 lb 9.6 oz (61.054 kg)  BMI 22.40 kg/m2 Constitutional: Pleasant,well-developed, white female in no acute distress. Cardiovascular: Normal rate, regular rhythm.  Pulmonary/chest: Effort normal and breath sounds normal. No wheezing, rales or rhonchi. Abdominal: Soft, nondistended, nontender. Bowel sounds active throughout. There are no masses palpable. No hepatomegaly. Rectal: old hemorrhoidal tags. No fissures. No masses on digital exam. Small internal hemorrhoids on anoscopy Extremities: no edema Neurological: Alert and oriented to person place and time. Skin: Skin is warm and dry. No rashes noted. Psychiatric: Normal mood and affect. Behavior is normal.   ASSESSMENT AND PLAN:  Pleasant 69 year old female with 2 episodes of painless rectal bleeding over the last 4 weeks. No bleeding in 3 weeks now. Suspect bleeding secondary  to internal hemorrhoids. Patient is up-to-date on her surveillance colonoscopy. Will treat with Anusol HC PR daily for 7 days. Patient will call after treatment with a condition update. If patient has recurrent bleeding we will need to see her back

## 2015-07-31 NOTE — Patient Instructions (Addendum)
Call us with a condition update after using the steroid treatment.   We sent a prescription for Anusol HC steroidal cream. This is $9.00.

## 2015-08-03 DIAGNOSIS — K648 Other hemorrhoids: Secondary | ICD-10-CM | POA: Insufficient documentation

## 2015-08-03 DIAGNOSIS — K625 Hemorrhage of anus and rectum: Secondary | ICD-10-CM | POA: Insufficient documentation

## 2015-08-07 DIAGNOSIS — E039 Hypothyroidism, unspecified: Secondary | ICD-10-CM | POA: Diagnosis not present

## 2015-08-07 DIAGNOSIS — Z23 Encounter for immunization: Secondary | ICD-10-CM | POA: Diagnosis not present

## 2015-08-07 DIAGNOSIS — J309 Allergic rhinitis, unspecified: Secondary | ICD-10-CM | POA: Diagnosis not present

## 2015-08-07 DIAGNOSIS — F329 Major depressive disorder, single episode, unspecified: Secondary | ICD-10-CM | POA: Diagnosis not present

## 2015-08-07 DIAGNOSIS — Z853 Personal history of malignant neoplasm of breast: Secondary | ICD-10-CM | POA: Diagnosis not present

## 2015-08-07 DIAGNOSIS — Z79899 Other long term (current) drug therapy: Secondary | ICD-10-CM | POA: Diagnosis not present

## 2015-08-07 DIAGNOSIS — E780 Pure hypercholesterolemia: Secondary | ICD-10-CM | POA: Diagnosis not present

## 2015-08-07 DIAGNOSIS — N200 Calculus of kidney: Secondary | ICD-10-CM | POA: Diagnosis not present

## 2015-08-22 NOTE — Progress Notes (Signed)
Agree with Ms. Guenther's assessment and plan. Carl E. Gessner, MD, FACG   

## 2015-09-03 ENCOUNTER — Telehealth: Payer: Self-pay | Admitting: Hematology and Oncology

## 2015-09-03 ENCOUNTER — Encounter: Payer: Self-pay | Admitting: Hematology and Oncology

## 2015-09-03 ENCOUNTER — Ambulatory Visit (HOSPITAL_BASED_OUTPATIENT_CLINIC_OR_DEPARTMENT_OTHER): Payer: Medicare Other | Admitting: Hematology and Oncology

## 2015-09-03 VITALS — BP 130/80 | HR 59 | Temp 97.9°F | Resp 18 | Ht 65.0 in | Wt 136.1 lb

## 2015-09-03 DIAGNOSIS — Z170 Estrogen receptor positive status [ER+]: Secondary | ICD-10-CM | POA: Diagnosis not present

## 2015-09-03 DIAGNOSIS — M791 Myalgia: Secondary | ICD-10-CM

## 2015-09-03 DIAGNOSIS — C50411 Malignant neoplasm of upper-outer quadrant of right female breast: Secondary | ICD-10-CM

## 2015-09-03 DIAGNOSIS — Z79811 Long term (current) use of aromatase inhibitors: Secondary | ICD-10-CM

## 2015-09-03 NOTE — Progress Notes (Signed)
Patient Care Team: Hulan Fess, MD as PCP - General (Family Medicine)  DIAGNOSIS: Breast cancer of upper-outer quadrant of right female breast [facility])   Staging form: Breast, AJCC 7th Edition     Clinical: Stage IA (T1c, N0, cM0) - Unsigned       Staging comments: Staged at breast conference 7.9.14      Pathologic: No stage assigned - Unsigned   SUMMARY OF ONCOLOGIC HISTORY:   Breast cancer of upper-outer quadrant of right female breast ([place])   04/23/2013 Initial Diagnosis Right breast calcifications stereotactic biopsy revealed invasive lobular cancer with LCIS, lymph node benign, ER positive PR negative Ki-67 10%   06/11/2013 Surgery Right breast lumpectomy: Lobular carcinoma in situ, no invasive disease found, sentinel nodes negative   08/06/2013 - 09/04/2013 Radiation Therapy Adjuvant radiation therapy   10/03/2013 -  Anti-estrogen oral therapy Tamoxifen 20 mg daily    CHIEF COMPLIANT: follow-up on tamoxifen  INTERVAL HISTORY: [name] is a 69 year old with above-mentioned history of right breast lobular carcinoma in situ treated with lumpectomy, radiation and is now on tamoxifen therapy. She is tolerating it very well without any major problems or concerns. She denies any hot flashes. Occasional myalgias. Mammogram done in June 2016 was normal. She had multiple back issues and had back MRIs. She got injections and finally her back pain is improved.  REVIEW OF SYSTEMS:   Constitutional: Denies fevers, chills or abnormal weight loss Eyes: Denies blurriness of vision Ears, nose, mouth, throat, and face: Denies mucositis or sore throat Respiratory: Denies cough, dyspnea or wheezes Cardiovascular: Denies palpitation, chest discomfort or lower extremity swelling Gastrointestinal:  Denies nausea, heartburn or change in bowel habits Skin: Denies abnormal skin rashes Lymphatics: Denies new lymphadenopathy or easy bruising Neurological:Denies numbness, tingling or new  weaknesses Behavioral/Psych: Mood is stable, no new changes  Breast:  denies any pain or lumps or nodules in either breasts All other systems were reviewed with the patient and are negative.  I have reviewed the past medical history, past surgical history, social history and family history with the patient and they are unchanged from previous note.  ALLERGIES:  is allergic to amoxicillin and terak.  MEDICATIONS:  Current Outpatient Prescriptions  Medication Sig Dispense Refill  . aspirin EC 81 MG tablet Take 81 mg by mouth every morning.     Marland Kitchen atorvastatin (LIPITOR) 10 MG tablet Take 10 mg by mouth every evening.     Marland Kitchen buPROPion (WELLBUTRIN XL) 300 MG 24 hr tablet Take 300 mg by mouth daily.    . Echinacea 125 MG CAPS Take 125 mg by mouth as needed (Cold prophylaxis).     . fluticasone (FLONASE) 50 MCG/ACT nasal spray Place 2 sprays into the nose daily.    Marland Kitchen GLUCOSAMINE-CHONDROITIN PO Take 2 tablets by mouth every morning.     . hydrocortisone (ANUSOL-HC) 25 MG suppository 1 suppository at bedtime for 7 days. 7 suppository 0  . ibuprofen (ADVIL) 200 MG tablet Take 400 mg by mouth every 6 (six) hours as needed for pain.     . Melatonin 5 MG TABS Take 5 mg by mouth at bedtime.    . meloxicam (MOBIC) 15 MG tablet Take 15 mg by mouth daily.    . Probiotic Product (PROBIOTIC DAILY PO) Take 1 tablet by mouth daily.    Marland Kitchen SYNTHROID 100 MCG tablet Take 100 mcg by mouth every other day.     Marland Kitchen SYNTHROID 88 MCG tablet Take 88 mcg by mouth every other  day.     . tamoxifen (NOLVADEX) 20 MG tablet TAKE 1 TABLET ONCE DAILY. 90 tablet 3  . TURMERIC PO Take 1 capsule by mouth daily.    Marland Kitchen zolpidem (AMBIEN) 10 MG tablet Take 10 mg by mouth at bedtime as needed for sleep.     No current facility-administered medications for this visit.    PHYSICAL EXAMINATION: ECOG PERFORMANCE STATUS: 0 - Asymptomatic  There were no vitals filed for this visit. There were no vitals filed for this  visit.  GENERAL:alert, no distress and comfortable SKIN: skin color, texture, turgor are normal, no rashes or significant lesions EYES: normal, Conjunctiva are pink and non-injected, sclera clear OROPHARYNX:no exudate, no erythema and lips, buccal mucosa, and tongue normal  NECK: supple, thyroid normal size, non-tender, without nodularity LYMPH:  no palpable lymphadenopathy in the cervical, axillary or inguinal LUNGS: clear to auscultation and percussion with normal breathing effort HEART: regular rate & rhythm and no murmurs and no lower extremity edema ABDOMEN:abdomen soft, non-tender and normal bowel sounds Musculoskeletal:no cyanosis of digits and no clubbing  NEURO: alert & oriented x 3 with fluent speech, no focal motor/sensory deficits BREAST: No palpable masses or nodules in either right or left breasts. No palpable axillary supraclavicular or infraclavicular adenopathy no breast tenderness or nipple discharge. (exam performed in the presence of a chaperone)  LABORATORY DATA:  I have reviewed the data as listed   Chemistry      Component Value Date/Time   NA 140 03/04/2015 1513   NA 139 08/26/2014 1615   K 4.4 03/04/2015 1513   K 4.2 08/26/2014 1615   CL 106 03/04/2015 1513   CO2 26 03/04/2015 1513   CO2 27 08/26/2014 1615   BUN 14 03/04/2015 1513   BUN 16.9 08/26/2014 1615   CREATININE 0.81 03/04/2015 1513   CREATININE 0.8 08/26/2014 1615      Component Value Date/Time   CALCIUM 9.1 03/04/2015 1513   CALCIUM 9.1 08/26/2014 1615   ALKPHOS 40 03/04/2015 1513   ALKPHOS 55 08/26/2014 1615   AST 22 03/04/2015 1513   AST 19 08/26/2014 1615   ALT 14 03/04/2015 1513   ALT 14 08/26/2014 1615   BILITOT 0.5 03/04/2015 1513   BILITOT 0.34 08/26/2014 1615       Lab Results  Component Value Date   WBC 4.1 03/04/2015   HGB 13.9 03/04/2015   HCT 43.6 03/04/2015   MCV 100.5 03/04/2015   PLT 193 03/04/2015   NEUTROABS 2.8 03/04/2015   ASSESSMENT & PLAN:  Breast cancer  of upper-outer quadrant of right female breast Right breast invasive lobular cancer with LCIS T1 N0 M0 stage IA status post lumpectomy which did not show any further evidence of invasive breast cancer. ER positive PR positive HER-2 negative, received adjuvant radiation completed 09/04/2013, started her tamoxifen 20 mg daily from November 2013  Tamoxifen toxicities: Patient denies any hot flashes. She does have myalgias but it may not be related to tamoxifen.  Breast cancer surveillance: 1. Breast exam 09/03/2015 is normal 2. Mammogram 04/28/2015 is normal, breast density category C 3. Patient did not get an MRI of the breast. Our original plan was to obtain breast MRI every other year.she had multiple back issues and hence she did not get the MRI. I will send a request to get her situated for an MRI as soon as possible.  We discussed interaction between Wellbutrin and tamoxifen. Unfortunately she is very well controlled on the Wellbutrin and does not want  to change that medication. I discussed that the left eye analysis has revealed that the interaction between Wellbutrin and tamoxifen does not seem to translate into increased risk of breast cancer in humans. In laboratory expired once it was found to be making tamoxifen less effective.  Return to clinic in 1 year for follow-up  No orders of the defined types were placed in this encounter.   The patient has a good understanding of the overall plan. she agrees with it. she will call with any problems that may develop before the next visit here.   Rulon Eisenmenger, MD 09/03/2015

## 2015-09-03 NOTE — Assessment & Plan Note (Signed)
Right breast invasive lobular cancer with LCIS T1 N0 M0 stage IA status post lumpectomy which did not show any further evidence of invasive breast cancer. ER positive PR positive HER-2 negative, received adjuvant radiation completed 09/04/2013, started her tamoxifen 20 mg daily from November 2013  Tamoxifen toxicities: Patient denies any hot flashes. She does have myalgias but it may not be related to tamoxifen.  Breast cancer surveillance: 1. Breast exam 09/03/2015 is normal 2. Mammogram 04/28/2015 is normal, breast density category C 3. Patient did not get an MRI of the breast it. Our original plan was to obtain breast MRI every other year.  Return to clinic in 6 months for follow-up

## 2015-09-03 NOTE — Telephone Encounter (Signed)
Appointments made and avs pritned for pateint,pateint will call for her mri

## 2015-09-24 DIAGNOSIS — L821 Other seborrheic keratosis: Secondary | ICD-10-CM | POA: Diagnosis not present

## 2015-09-24 DIAGNOSIS — D225 Melanocytic nevi of trunk: Secondary | ICD-10-CM | POA: Diagnosis not present

## 2015-09-24 DIAGNOSIS — D485 Neoplasm of uncertain behavior of skin: Secondary | ICD-10-CM | POA: Diagnosis not present

## 2015-09-24 DIAGNOSIS — L570 Actinic keratosis: Secondary | ICD-10-CM | POA: Diagnosis not present

## 2015-09-24 DIAGNOSIS — D0471 Carcinoma in situ of skin of right lower limb, including hip: Secondary | ICD-10-CM | POA: Diagnosis not present

## 2015-10-21 DIAGNOSIS — D0471 Carcinoma in situ of skin of right lower limb, including hip: Secondary | ICD-10-CM | POA: Diagnosis not present

## 2015-11-09 DIAGNOSIS — Z853 Personal history of malignant neoplasm of breast: Secondary | ICD-10-CM | POA: Diagnosis not present

## 2016-01-06 ENCOUNTER — Other Ambulatory Visit: Payer: Medicare Other

## 2016-01-25 ENCOUNTER — Other Ambulatory Visit: Payer: Medicare Other

## 2016-01-25 ENCOUNTER — Ambulatory Visit
Admission: RE | Admit: 2016-01-25 | Discharge: 2016-01-25 | Disposition: A | Discharge status: Home / Self Care | Payer: Medicare Other | Source: Ambulatory Visit | Attending: Hematology and Oncology | Admitting: Hematology and Oncology

## 2016-01-25 ENCOUNTER — Telehealth: Payer: Self-pay | Admitting: Hematology and Oncology

## 2016-01-25 DIAGNOSIS — N6489 Other specified disorders of breast: Secondary | ICD-10-CM | POA: Diagnosis not present

## 2016-01-25 DIAGNOSIS — C50411 Malignant neoplasm of upper-outer quadrant of right female breast: Secondary | ICD-10-CM

## 2016-01-25 MED ORDER — GADOBENATE DIMEGLUMINE 529 MG/ML IV SOLN
12.0000 mL | Freq: Once | INTRAVENOUS | Status: AC | PRN
  Administered 2016-01-25: 12 mL via INTRAVENOUS

## 2016-01-25 NOTE — Telephone Encounter (Signed)
I called and left a message with the patient's home phone number that the breast MRI was normal

## 2016-02-08 DIAGNOSIS — H04123 Dry eye syndrome of bilateral lacrimal glands: Secondary | ICD-10-CM | POA: Diagnosis not present

## 2016-02-08 DIAGNOSIS — H2513 Age-related nuclear cataract, bilateral: Secondary | ICD-10-CM | POA: Diagnosis not present

## 2016-02-08 DIAGNOSIS — H5203 Hypermetropia, bilateral: Secondary | ICD-10-CM | POA: Diagnosis not present

## 2016-02-25 DIAGNOSIS — R5383 Other fatigue: Secondary | ICD-10-CM | POA: Diagnosis not present

## 2016-02-25 DIAGNOSIS — R8290 Unspecified abnormal findings in urine: Secondary | ICD-10-CM | POA: Diagnosis not present

## 2016-02-25 DIAGNOSIS — R7989 Other specified abnormal findings of blood chemistry: Secondary | ICD-10-CM | POA: Diagnosis not present

## 2016-02-25 DIAGNOSIS — Z853 Personal history of malignant neoplasm of breast: Secondary | ICD-10-CM | POA: Diagnosis not present

## 2016-02-25 DIAGNOSIS — R140 Abdominal distension (gaseous): Secondary | ICD-10-CM | POA: Diagnosis not present

## 2016-02-26 ENCOUNTER — Other Ambulatory Visit: Payer: Self-pay | Admitting: Family Medicine

## 2016-02-26 DIAGNOSIS — R140 Abdominal distension (gaseous): Secondary | ICD-10-CM

## 2016-03-03 ENCOUNTER — Ambulatory Visit
Admission: RE | Admit: 2016-03-03 | Discharge: 2016-03-03 | Disposition: A | Discharge status: Home / Self Care | Payer: Medicare Other | Source: Ambulatory Visit | Attending: Family Medicine | Admitting: Family Medicine

## 2016-03-03 DIAGNOSIS — R140 Abdominal distension (gaseous): Secondary | ICD-10-CM

## 2016-03-23 DIAGNOSIS — R938 Abnormal findings on diagnostic imaging of other specified body structures: Secondary | ICD-10-CM | POA: Diagnosis not present

## 2016-03-23 DIAGNOSIS — Z853 Personal history of malignant neoplasm of breast: Secondary | ICD-10-CM | POA: Diagnosis not present

## 2016-03-23 DIAGNOSIS — N882 Stricture and stenosis of cervix uteri: Secondary | ICD-10-CM | POA: Diagnosis not present

## 2016-03-23 DIAGNOSIS — Z124 Encounter for screening for malignant neoplasm of cervix: Secondary | ICD-10-CM | POA: Diagnosis not present

## 2016-03-23 DIAGNOSIS — Z01419 Encounter for gynecological examination (general) (routine) without abnormal findings: Secondary | ICD-10-CM | POA: Diagnosis not present

## 2016-03-31 DIAGNOSIS — R938 Abnormal findings on diagnostic imaging of other specified body structures: Secondary | ICD-10-CM | POA: Diagnosis not present

## 2016-03-31 DIAGNOSIS — N859 Noninflammatory disorder of uterus, unspecified: Secondary | ICD-10-CM | POA: Diagnosis not present

## 2016-04-15 ENCOUNTER — Other Ambulatory Visit: Payer: Self-pay | Admitting: Hematology and Oncology

## 2016-04-15 DIAGNOSIS — C50411 Malignant neoplasm of upper-outer quadrant of right female breast: Secondary | ICD-10-CM

## 2016-04-27 ENCOUNTER — Telehealth: Payer: Self-pay | Admitting: Cardiology

## 2016-04-27 NOTE — Progress Notes (Signed)
Cardiology Office Note   Date:  04/28/2016   ID:  [name], DOB Sep 12, 1946, MRN [id number]  PCP:  Gennette Pac, MD  Cardiologist:   Minus Breeding, MD   Chief Complaint  Patient presents with  . Palpitations      History of Present Illness: [name] is a 70 y.o. female who presents for evaluation of palpitations. I saw her 12 years ago and she was having palpitations. I don't have these records. She does have mitral valve prolapse. However, nothing significant was noted and she didn't have any problems until she developed Graves' disease 2011. She again had palpitations. She was living in [place] and she had a workup by a cardiologist there. Coronary CT demonstrated some diagonal stenosis but she doesn't know of any other disease. She had a treadmill test which apparently was negative.    Couple of weeks ago she started noticing increasing palpitations. This was similar to previous. It would happen more often with activity but also happen at rest. She would feel dizzy and have on knees. It might last for 15 minutes or might go on and off all day long. She would feel missed beats or rapid beats. She didn't have any frank syncope. She's not been having any chest pressure, neck or arm discomfort. She's not been having any shortness of breath, PND or orthopnea. She can do activities such as playing tennis without bringing these on. She does know that her thyroid level is normal. She had ablation in 2011. I have reviewed her primary care office records and these were unremarkable.  Past Medical History  Diagnosis Date  . Diverticulosis   . Adenomatous colon polyp 03/07/2012  . Arthritis   . Allergic rhinitis   . HLD (hyperlipidemia)   . IBS (irritable bowel syndrome)   . Graves' disease     2011  . Breast cancer ([place]) 05/20/13    right  . Hx of radiation therapy 08/13/13- 09/06/13    right breast 4250 cGy 17 sessions    Past Surgical History  Procedure Laterality  Date  . Colonoscopy w/ biopsies and polypectomy  multiple  . Breast biopsy Right   . Knee arthroscopy Left   . Breast lumpectomy      06/11/13  with lymph node biopsy  . Breast lumpectomy with needle localization and axillary sentinel lymph node bx Right 06/11/2013    Procedure: BREAST LUMPECTOMY WITH NEEDLE LOCALIZATION AND AXILLARY SENTINEL LYMPH NODE BX;  Surgeon: Harl Bowie, MD;  Location: [facility];  Service: General;  Laterality: Right;  Needle localization BCG 7:30      Current Outpatient Prescriptions  Medication Sig Dispense Refill  . atorvastatin (LIPITOR) 10 MG tablet Take 10 mg by mouth every evening.     Marland Kitchen buPROPion (WELLBUTRIN XL) 300 MG 24 hr tablet Take 300 mg by mouth daily.    . Echinacea 125 MG CAPS Take 125 mg by mouth as needed (Cold prophylaxis).     . fluticasone (FLONASE) 50 MCG/ACT nasal spray Place 2 sprays into the nose daily.    Marland Kitchen ibuprofen (ADVIL) 200 MG tablet Take 400 mg by mouth every 6 (six) hours as needed for pain.     . meloxicam (MOBIC) 15 MG tablet Take 15 mg by mouth daily.    . Probiotic Product (PROBIOTIC DAILY PO) Take 1 tablet by mouth daily.    Marland Kitchen SYNTHROID 100 MCG tablet Take 100 mcg by mouth every other day.     Marland Kitchen  SYNTHROID 88 MCG tablet Take 88 mcg by mouth every other day.     . tamoxifen (NOLVADEX) 20 MG tablet TAKE 1 TABLET ONCE DAILY. 90 tablet 3  . TURMERIC PO Take 1 capsule by mouth daily.    Marland Kitchen zolpidem (AMBIEN) 10 MG tablet Take 10 mg by mouth at bedtime as needed for sleep.     No current facility-administered medications for this visit.    Allergies:   Amoxicillin; Terak; and Oxytetracycline    Social History:  The patient  reports that she has never smoked. She has never used smokeless tobacco. She reports that she drinks alcohol. She reports that she does not use illicit drugs.   Family History:  The patient's family history includes Alcohol abuse in her father; Breast cancer in her maternal aunt; COPD in her father; Colon  cancer in her maternal grandfather; Diabetes in her paternal grandmother; Heart disease (age of onset: 20) in her mother; Hypertension in her mother; Lung cancer in her mother; Pancreatic cancer in her maternal aunt.    ROS:  Please see the history of present illness.   Otherwise, review of systems are positive for none.   All other systems are reviewed and negative.    PHYSICAL EXAM: VS:  BP 140/87 mmHg  Pulse 53  Ht 5' 0.5" (1.537 m)  Wt 137 lb 3.2 oz (62.234 kg)  BMI 26.34 kg/m2 , BMI Body mass index is 26.34 kg/(m^2). GENERAL:  Well appearing HEENT:  Pupils equal round and reactive, fundi not visualized, oral mucosa unremarkable NECK:  No jugular venous distention, waveform within normal limits, carotid upstroke brisk and symmetric, no bruits, no thyromegaly LYMPHATICS:  No cervical, inguinal adenopathy LUNGS:  Clear to auscultation bilaterally BACK:  No CVA tenderness CHEST:  Unremarkable HEART:  PMI not displaced or sustained,S1 and S2 within normal limits, no S3, no S4, no clicks, no rubs, 2 out of 6 late onset systolic murmur heard at the axilla, no diastolic murmurs ABD:  Flat, positive bowel sounds normal in frequency in pitch, no bruits, no rebound, no guarding, no midline pulsatile mass, no hepatomegaly, no splenomegaly EXT:  2 plus pulses throughout, no edema, no cyanosis no clubbing SKIN:  No rashes no nodules NEURO:  Cranial nerves II through XII grossly intact, motor grossly intact throughout PSYCH:  Cognitively intact, oriented to person place and time    EKG:  EKG is ordered today. The ekg ordered today demonstrates sinus bradycardia, rate 53, axis within normal limits, intervals within normal limits, no acute ST-T wave changes.   Recent Labs: No results found for requested labs within last 365 days.    Lipid Panel No results found for: CHOL, TRIG, HDL, CHOLHDL, VLDL, LDLCALC, LDLDIRECT    Wt Readings from Last 3 Encounters:  04/28/16 137 lb 3.2 oz (62.234  kg)  09/03/15 136 lb 1.6 oz (61.735 kg)  07/31/15 134 lb 9.6 oz (61.054 kg)      Other studies Reviewed: Additional studies/ records that were reviewed today include: Office records. Review of the above records demonstrates:  Please see elsewhere in the note.     ASSESSMENT AND PLAN:  PALPITATIONS:  She's going to start taking metoprolol when necessary only when she has palpitations. I am going to bring her back for a treadmill test.  I wiould like to see if any of her arrhythmias were inducible with exercise.  CAD:  She did have CAD as described. This will be evaluated with the POET (Plain Old Exercise Treadmill)  DYSLIPIDEMIA:  Her LDL was 91 and HDL 84. She'll continue on the meds as listed.   MVP:  She does have a mitral regurgitant murmur though I expect this is very mild. I will check an echocardiogram.   Current medicines are reviewed at length with the patient today.  The patient does not have concerns regarding medicines.  The following changes have been made:  As above  Labs/ tests ordered today include:   Orders Placed This Encounter  Procedures  . EXERCISE TOLERANCE TEST  . EKG 12-Lead  . ECHOCARDIOGRAM COMPLETE     Disposition:   FU with me as needed.     Signed, Minus Breeding, MD  04/28/2016 2:33 PM    [facility]

## 2016-04-27 NOTE — Telephone Encounter (Signed)
Received records from [facility] for appointment with Dr Percival Spanish on 04/28/16.  Records given to [facility] (medical records) for Dr Hochrein's schedule on 04/28/16. lp

## 2016-04-28 ENCOUNTER — Encounter: Payer: Self-pay | Admitting: Cardiology

## 2016-04-28 ENCOUNTER — Ambulatory Visit (INDEPENDENT_AMBULATORY_CARE_PROVIDER_SITE_OTHER): Payer: Medicare Other | Admitting: Cardiology

## 2016-04-28 VITALS — BP 140/87 | HR 53 | Ht 60.5 in | Wt 137.2 lb

## 2016-04-28 DIAGNOSIS — I2510 Atherosclerotic heart disease of native coronary artery without angina pectoris: Secondary | ICD-10-CM | POA: Diagnosis not present

## 2016-04-28 DIAGNOSIS — I341 Nonrheumatic mitral (valve) prolapse: Secondary | ICD-10-CM | POA: Diagnosis not present

## 2016-04-28 MED ORDER — METOPROLOL TARTRATE 25 MG PO TABS: 25.0000 mg | ORAL_TABLET | ORAL | Status: DC | PRN

## 2016-04-28 NOTE — Patient Instructions (Signed)
Medication Instructions:  START Metoprolol 25 mg as needed  Labwork: NONE  Testing/Procedures: Your physician has requested that you have an echocardiogram. Echocardiography is a painless test that uses sound waves to create images of your heart. It provides your doctor with information about the size and shape of your heart and how well your heart's chambers and valves are working. This procedure takes approximately one hour. There are no restrictions for this procedure.  Your physician has requested that you have an exercise tolerance test. For further information please visit [email or web address]. Please also follow instruction sheet, as given.  Follow-Up: Your physician recommends that you schedule a follow-up appointment in: As Needed   Any Other Special Instructions Will Be Listed Below (If Applicable).  If you need a refill on your cardiac medications before your next appointment, please call your pharmacy.

## 2016-05-03 ENCOUNTER — Encounter: Payer: Self-pay | Admitting: Cardiology

## 2016-05-04 ENCOUNTER — Telehealth (HOSPITAL_COMMUNITY): Payer: Self-pay

## 2016-05-04 NOTE — Telephone Encounter (Signed)
Encounter complete. 

## 2016-05-06 ENCOUNTER — Ambulatory Visit (HOSPITAL_COMMUNITY)
Admission: RE | Admit: 2016-05-06 | Discharge: 2016-05-06 | Disposition: A | Discharge status: Home / Self Care | Payer: Medicare Other | Source: Ambulatory Visit | Attending: Cardiovascular Disease | Admitting: Cardiovascular Disease

## 2016-05-06 DIAGNOSIS — I2510 Atherosclerotic heart disease of native coronary artery without angina pectoris: Secondary | ICD-10-CM | POA: Diagnosis not present

## 2016-05-06 LAB — EXERCISE TOLERANCE TEST
CHL CUP RESTING HR STRESS: 68 {beats}/min
CHL RATE OF PERCEIVED EXERTION: 16
CSEPED: 10 min
Estimated workload: 11.9 METS
MPHR: 151 {beats}/min
Peak HR: 139 {beats}/min
Percent HR: 92 %

## 2016-05-09 ENCOUNTER — Telehealth: Payer: Self-pay | Admitting: Internal Medicine

## 2016-05-10 NOTE — Telephone Encounter (Signed)
Left message for pt to call back  °

## 2016-05-12 NOTE — Telephone Encounter (Signed)
Pt did not return phone call. 

## 2016-05-18 ENCOUNTER — Other Ambulatory Visit (HOSPITAL_COMMUNITY): Payer: Medicare Other

## 2016-05-26 ENCOUNTER — Ambulatory Visit (HOSPITAL_COMMUNITY): Payer: Medicare Other | Attending: Internal Medicine

## 2016-05-26 ENCOUNTER — Other Ambulatory Visit: Payer: Self-pay

## 2016-05-26 DIAGNOSIS — I071 Rheumatic tricuspid insufficiency: Secondary | ICD-10-CM | POA: Diagnosis not present

## 2016-05-26 DIAGNOSIS — I341 Nonrheumatic mitral (valve) prolapse: Secondary | ICD-10-CM | POA: Diagnosis not present

## 2016-05-26 DIAGNOSIS — I340 Nonrheumatic mitral (valve) insufficiency: Secondary | ICD-10-CM | POA: Insufficient documentation

## 2016-05-26 DIAGNOSIS — I2510 Atherosclerotic heart disease of native coronary artery without angina pectoris: Secondary | ICD-10-CM | POA: Diagnosis not present

## 2016-05-26 DIAGNOSIS — E785 Hyperlipidemia, unspecified: Secondary | ICD-10-CM | POA: Diagnosis not present

## 2016-05-26 DIAGNOSIS — I517 Cardiomegaly: Secondary | ICD-10-CM | POA: Insufficient documentation

## 2016-05-26 DIAGNOSIS — I5189 Other ill-defined heart diseases: Secondary | ICD-10-CM | POA: Insufficient documentation

## 2016-05-26 LAB — ECHOCARDIOGRAM COMPLETE
CHL CUP MV DEC (S): 299
CHL CUP TV REG PEAK VELOCITY: 179 cm/s
E/e' ratio: 5.37
EWDT: 299 ms
FS: 32 % (ref 28–44)
IVS/LV PW RATIO, ED: 0.96
LA ID, A-P, ES: 32 mm
LA diam index: 2 cm/m2
LA vol A4C: 40 ml
LA vol index: 25 mL/m2
LAVOL: 40 mL
LEFT ATRIUM END SYS DIAM: 32 mm
LV E/e'average: 5.37
LV TDI E'LATERAL: 10.2
LV e' LATERAL: 10.2 cm/s
LVEEMED: 5.37
LVOT VTI: 23.5 cm
LVOT area: 3.46 cm2
LVOT peak grad rest: 6 mmHg
LVOTD: 21 mm
LVOTPV: 120 cm/s
LVOTSV: 81 mL
MV pk A vel: 70.1 m/s
MV pk E vel: 54.8 m/s
PW: 10.8 mm — AB (ref 0.6–1.1)
TDI e' medial: 5.59
TR max vel: 179 cm/s

## 2016-06-02 ENCOUNTER — Other Ambulatory Visit: Payer: Self-pay | Admitting: Hematology and Oncology

## 2016-06-02 DIAGNOSIS — Z853 Personal history of malignant neoplasm of breast: Secondary | ICD-10-CM

## 2016-07-18 ENCOUNTER — Ambulatory Visit: Payer: Medicare Other | Admitting: Internal Medicine

## 2016-07-28 ENCOUNTER — Ambulatory Visit
Admission: RE | Admit: 2016-07-28 | Discharge: 2016-07-28 | Disposition: A | Discharge status: Home / Self Care | Payer: Medicare Other | Source: Ambulatory Visit | Attending: Hematology and Oncology | Admitting: Hematology and Oncology

## 2016-07-28 DIAGNOSIS — Z853 Personal history of malignant neoplasm of breast: Secondary | ICD-10-CM

## 2016-07-28 DIAGNOSIS — R922 Inconclusive mammogram: Secondary | ICD-10-CM | POA: Diagnosis not present

## 2016-08-26 ENCOUNTER — Ambulatory Visit (INDEPENDENT_AMBULATORY_CARE_PROVIDER_SITE_OTHER): Payer: Medicare Other | Admitting: Cardiology

## 2016-08-26 ENCOUNTER — Encounter: Payer: Self-pay | Admitting: Cardiology

## 2016-08-26 VITALS — BP 134/90 | HR 62 | Ht 65.5 in | Wt 136.1 lb

## 2016-08-26 DIAGNOSIS — I2510 Atherosclerotic heart disease of native coronary artery without angina pectoris: Secondary | ICD-10-CM

## 2016-08-26 DIAGNOSIS — C50411 Malignant neoplasm of upper-outer quadrant of right female breast: Secondary | ICD-10-CM

## 2016-08-26 DIAGNOSIS — E785 Hyperlipidemia, unspecified: Secondary | ICD-10-CM

## 2016-08-26 DIAGNOSIS — Z87898 Personal history of other specified conditions: Secondary | ICD-10-CM | POA: Insufficient documentation

## 2016-08-26 DIAGNOSIS — K581 Irritable bowel syndrome with constipation: Secondary | ICD-10-CM | POA: Diagnosis not present

## 2016-08-26 DIAGNOSIS — I059 Rheumatic mitral valve disease, unspecified: Secondary | ICD-10-CM | POA: Diagnosis not present

## 2016-08-26 DIAGNOSIS — I10 Essential (primary) hypertension: Secondary | ICD-10-CM | POA: Diagnosis not present

## 2016-08-26 MED ORDER — LISINOPRIL 10 MG PO TABS: 10.0000 mg | ORAL_TABLET | Freq: Every day | ORAL | 11 refills | Status: DC

## 2016-08-26 NOTE — Assessment & Plan Note (Signed)
Followed by Dr. Gessner. 

## 2016-08-26 NOTE — Progress Notes (Signed)
08/26/2016 [name]   04-20-1946  [id number]  Primary Physician Gennette Pac, MD Primary Cardiologist: Dr Percival Spanish  HPI:  71 y.o. female followed by Dr Percival Spanish for palpitations, mitral valve prolapse, and mild MR.  She had seen a cardiologist while living in [place]. Coronary CT there demonstrated some diagonal stenosis but she doesn't know of any other disease. She saw Dr Percival Spanish in June and was set up for an echo and POET. The POET was negative. The echo showed mild LVH, MVP, and mild MR. She was to f/u with her PCP but she tells me he is retiring and she had a hard time getting an appointment. Recently she had some vague dizziness and started documenting her B/P. She notes her diastolic B/P has been running in the 90s and set up this appointment. She denies any other problems.    Current Outpatient Prescriptions  Medication Sig Dispense Refill  . atorvastatin (LIPITOR) 10 MG tablet Take 10 mg by mouth every evening.     Marland Kitchen buPROPion (WELLBUTRIN XL) 300 MG 24 hr tablet Take 300 mg by mouth daily.    . Echinacea 125 MG CAPS Take 125 mg by mouth as needed (Cold prophylaxis).     . fluticasone (FLONASE) 50 MCG/ACT nasal spray Place 2 sprays into the nose daily.    Marland Kitchen ibuprofen (ADVIL) 200 MG tablet Take 400 mg by mouth every 6 (six) hours as needed for pain.     . meloxicam (MOBIC) 15 MG tablet Take 15 mg by mouth daily.    . metoprolol tartrate (LOPRESSOR) 25 MG tablet Take 1 tablet (25 mg total) by mouth as needed. 30 tablet 3  . Probiotic Product (PROBIOTIC DAILY PO) Take 1 tablet by mouth daily.    Marland Kitchen SYNTHROID 100 MCG tablet Take 100 mcg by mouth every other day.     Marland Kitchen SYNTHROID 88 MCG tablet Take 88 mcg by mouth every other day.     . tamoxifen (NOLVADEX) 20 MG tablet TAKE 1 TABLET ONCE DAILY. 90 tablet 3  . TURMERIC PO Take 2 capsules by mouth daily.    Marland Kitchen zolpidem (AMBIEN) 10 MG tablet Take 10 mg by mouth at bedtime as needed for sleep.    Marland Kitchen lisinopril  (PRINIVIL,ZESTRIL) 10 MG tablet Take 1 tablet (10 mg total) by mouth daily. 30 tablet 11   No current facility-administered medications for this visit.     Allergies  Allergen Reactions  . Amoxicillin Diarrhea    diarrhea  . Terak [Terramycin] Rash  . Oxytetracycline Rash    Social History   Social History  . Marital status: Married    Spouse name: N/A  . Number of children: 1  . Years of education: N/A   Occupational History  . RN Unemployed   Social History Main Topics  . Smoking status: Never Smoker  . Smokeless tobacco: Never Used  . Alcohol use 0.0 oz/week     Comment: occasional  . Drug use: No  . Sexual activity: Yes     Comment: menarche 8, G 1 age 41, menopause 53, HRT x 9 yrs   Other Topics Concern  . Not on file   Social History Narrative  . No narrative on file     Review of Systems: General: negative for chills, fever, night sweats or weight changes.  Cardiovascular: negative for chest pain, dyspnea on exertion, edema, orthopnea, paroxysmal nocturnal dyspnea or shortness of breath Dermatological: negative for rash Respiratory: negative for cough or wheezing  Urologic: negative for hematuria Abdominal: negative for nausea, vomiting, diarrhea, bright red blood per rectum, melena, or hematemesis Neurologic: negative for visual changes, syncope, or dizziness All other systems reviewed and are otherwise negative except as noted above.    Blood pressure 134/90, pulse 62, height 5' 5.5" (1.664 m), weight 136 lb 2 oz (61.7 kg).  General appearance: alert, cooperative and no distress Neck: no carotid bruit and no JVD Lungs: clear to auscultation bilaterally Heart: regular rate and rhythm and 1-2 systoilic murmur Lt axillary area Extremities: extremities normal, atraumatic, no cyanosis or edema Skin: Skin color, texture, turgor normal. No rashes or lesions Neurologic: Grossly normal   ASSESSMENT AND PLAN:   Essential hypertension Pt is in the  office today because she has noticed her B/P (diastolic) has been running high-90's. She had some dizziness which prompted her to start recording her B/P.   Mitral valve disorder MVP with mild MR on July 2017 echo  History of palpitations Chronic- on lopressor PRN  Irritable bowel syndrome - constipation predominant Followed by Dr Carlean Purl  Breast cancer of upper-outer quadrant of right female breast S/P lumpectomy and radiation Rx 2014  Dyslipidemia On statin Rx   PLAN  I suggested we add Lisinopril 10 mg. She'll get a BMP and a f/u B/P in 2 weeks. She asked that we follow this issue for now.   Kerin Ransom PA-C 08/26/2016 10:41 AM

## 2016-08-26 NOTE — Assessment & Plan Note (Signed)
On statin Rx 

## 2016-08-26 NOTE — Assessment & Plan Note (Signed)
Chronic- on lopressor PRN

## 2016-08-26 NOTE — Assessment & Plan Note (Signed)
S/P lumpectomy and radiation Rx 2014

## 2016-08-26 NOTE — Patient Instructions (Signed)
Start Lisinopril 10 mg daily  Nurse visit for Blood Pressure in 2 weeks  Lab ( Bmet ) in 2 weeks  Your physician wants you to follow-up in: 6 months with Dr.Hochrein. You will receive a reminder letter in the mail two months in advance. If you don't receive a letter, please call our office to schedule the follow-up appointment.

## 2016-08-26 NOTE — Assessment & Plan Note (Signed)
Pt is in the office today because she has noticed her B/P (diastolic) has been running high-90's. She had some dizziness which prompted her to start recording her B/P.

## 2016-08-26 NOTE — Assessment & Plan Note (Signed)
MVP with mild MR on July 2017 echo

## 2016-09-01 ENCOUNTER — Ambulatory Visit (HOSPITAL_BASED_OUTPATIENT_CLINIC_OR_DEPARTMENT_OTHER): Payer: Medicare Other | Admitting: Hematology and Oncology

## 2016-09-01 ENCOUNTER — Encounter: Payer: Self-pay | Admitting: Hematology and Oncology

## 2016-09-01 DIAGNOSIS — C50411 Malignant neoplasm of upper-outer quadrant of right female breast: Secondary | ICD-10-CM

## 2016-09-01 DIAGNOSIS — Z170 Estrogen receptor positive status [ER+]: Secondary | ICD-10-CM

## 2016-09-01 DIAGNOSIS — Z79811 Long term (current) use of aromatase inhibitors: Secondary | ICD-10-CM | POA: Diagnosis not present

## 2016-09-01 NOTE — Progress Notes (Signed)
Patient Care Team: Hulan Fess, MD as PCP - General (Family Medicine)  DIAGNOSIS: Breast cancer of upper-outer quadrant of right female breast [facility])   Staging form: Breast, AJCC 7th Edition   - Clinical: Stage IA (T1c, N0, cM0) - Unsigned         Staging comments: Staged at breast conference 7.9.14    - Pathologic: No stage assigned - Unsigned  SUMMARY OF ONCOLOGIC HISTORY:   Breast cancer of upper-outer quadrant of right female breast ([place])   04/23/2013 Initial Diagnosis    Right breast calcifications stereotactic biopsy revealed invasive lobular cancer with LCIS, lymph node benign, ER positive PR negative Ki-67 10%      06/11/2013 Surgery    Right breast lumpectomy: Lobular carcinoma in situ, no invasive disease found, sentinel nodes negative      08/06/2013 - 09/04/2013 Radiation Therapy    Adjuvant radiation therapy      10/03/2013 -  Anti-estrogen oral therapy    Tamoxifen 20 mg daily       CHIEF COMPLIANT: Follow-up on tamoxifen therapy  INTERVAL HISTORY: [name] is a 57 year with above-mentioned history of right breast lobular cancer who underwent lumpectomy followed by radiation and is currently on tamoxifen. She's been on it for the past 3 years and appears to be tolerating it extremely well. She does not have any hot flashes. She was found to have endometrial thickening and underwent a biopsy which was normal. She has some bloating sensation. She denies any lumps or nodules in the breasts.  REVIEW OF SYSTEMS:   Constitutional: Denies fevers, chills or abnormal weight loss Eyes: Denies blurriness of vision Ears, nose, mouth, throat, and face: Denies mucositis or sore throat Respiratory: Denies cough, dyspnea or wheezes Cardiovascular: Denies palpitation, chest discomfort Gastrointestinal:  Denies nausea, heartburn or change in bowel habits Skin: Denies abnormal skin rashes Lymphatics: Denies new lymphadenopathy or easy bruising Neurological:Denies  numbness, tingling or new weaknesses Behavioral/Psych: Mood is stable, no new changes  Extremities: No lower extremity edema Breast:  denies any pain or lumps or nodules in either breasts All other systems were reviewed with the patient and are negative.  I have reviewed the past medical history, past surgical history, social history and family history with the patient and they are unchanged from previous note.  ALLERGIES:  is allergic to amoxicillin; terak [terramycin]; and oxytetracycline.  MEDICATIONS:  Current Outpatient Prescriptions  Medication Sig Dispense Refill  . atorvastatin (LIPITOR) 10 MG tablet Take 10 mg by mouth every evening.     Marland Kitchen buPROPion (WELLBUTRIN XL) 300 MG 24 hr tablet Take 300 mg by mouth daily.    . Echinacea 125 MG CAPS Take 125 mg by mouth as needed (Cold prophylaxis).     . fluticasone (FLONASE) 50 MCG/ACT nasal spray Place 2 sprays into the nose daily.    Marland Kitchen ibuprofen (ADVIL) 200 MG tablet Take 400 mg by mouth every 6 (six) hours as needed for pain.     Marland Kitchen lisinopril (PRINIVIL,ZESTRIL) 10 MG tablet Take 1 tablet (10 mg total) by mouth daily. 30 tablet 11  . meloxicam (MOBIC) 15 MG tablet Take 15 mg by mouth daily.    . metoprolol tartrate (LOPRESSOR) 25 MG tablet Take 1 tablet (25 mg total) by mouth as needed. 30 tablet 3  . Probiotic Product (PROBIOTIC DAILY PO) Take 1 tablet by mouth daily.    Marland Kitchen SYNTHROID 100 MCG tablet Take 100 mcg by mouth every other day.     Marland Kitchen  SYNTHROID 88 MCG tablet Take 88 mcg by mouth every other day.     . tamoxifen (NOLVADEX) 20 MG tablet TAKE 1 TABLET ONCE DAILY. 90 tablet 3  . TURMERIC PO Take 2 capsules by mouth daily.    Marland Kitchen zolpidem (AMBIEN) 10 MG tablet Take 10 mg by mouth at bedtime as needed for sleep.     No current facility-administered medications for this visit.     PHYSICAL EXAMINATION: ECOG PERFORMANCE STATUS: 0 - Asymptomatic  Vitals:   09/01/16 1410  BP: 110/72  Pulse: (!) 54  Resp: 18  Temp: 98.5 F (36.9  C)   Filed Weights   09/01/16 1410  Weight: 137 lb 8 oz (62.4 kg)    GENERAL:alert, no distress and comfortable SKIN: skin color, texture, turgor are normal, no rashes or significant lesions EYES: normal, Conjunctiva are pink and non-injected, sclera clear OROPHARYNX:no exudate, no erythema and lips, buccal mucosa, and tongue normal  NECK: supple, thyroid normal size, non-tender, without nodularity LYMPH:  no palpable lymphadenopathy in the cervical, axillary or inguinal LUNGS: clear to auscultation and percussion with normal breathing effort HEART: regular rate & rhythm and no murmurs and no lower extremity edema ABDOMEN:abdomen soft, non-tender and normal bowel sounds MUSCULOSKELETAL:no cyanosis of digits and no clubbing  NEURO: alert & oriented x 3 with fluent speech, no focal motor/sensory deficits EXTREMITIES: No lower extremity edema BREAST: No palpable masses or nodules in either right or left breasts. No palpable axillary supraclavicular or infraclavicular adenopathy no breast tenderness or nipple discharge. (exam performed in the presence of a chaperone)  LABORATORY DATA:  I have reviewed the data as listed   Chemistry      Component Value Date/Time   NA 140 03/04/2015 1513   NA 139 08/26/2014 1615   K 4.4 03/04/2015 1513   K 4.2 08/26/2014 1615   CL 106 03/04/2015 1513   CO2 26 03/04/2015 1513   CO2 27 08/26/2014 1615   BUN 14 03/04/2015 1513   BUN 16.9 08/26/2014 1615   CREATININE 0.81 03/04/2015 1513   CREATININE 0.8 08/26/2014 1615      Component Value Date/Time   CALCIUM 9.1 03/04/2015 1513   CALCIUM 9.1 08/26/2014 1615   ALKPHOS 40 03/04/2015 1513   ALKPHOS 55 08/26/2014 1615   AST 22 03/04/2015 1513   AST 19 08/26/2014 1615   ALT 14 03/04/2015 1513   ALT 14 08/26/2014 1615   BILITOT 0.5 03/04/2015 1513   BILITOT 0.34 08/26/2014 1615       Lab Results  Component Value Date   WBC 4.1 03/04/2015   HGB 13.9 03/04/2015   HCT 43.6 03/04/2015   MCV  100.5 03/04/2015   PLT 193 03/04/2015   NEUTROABS 2.8 03/04/2015     ASSESSMENT & PLAN:  Breast cancer of upper-outer quadrant of right female breast Right breast invasive lobular cancer with LCIS T1 N0 M0 stage IA status post lumpectomy which did not show any further evidence of invasive breast cancer. ER positive PR positive HER-2 negative, received adjuvant radiation completed 09/04/2013, started her tamoxifen 20 mg daily from November 2013  Tamoxifen toxicities: Patient denies any hot flashes. She does have myalgias but it may not be related to tamoxifen. She will be on tamoxifen for 10 years We discussed 5 years of tamoxifen followed by 5 years of anastrozole but because of patient already has lots of musculoskeletal aches and pains she is not keen on changing treatment to anastrozole after the 5 years of tamoxifen.  Breast cancer surveillance: 1. Breast exam 09/01/2016 does not reveal any palpable lumps or nodules of concern. 2. Mammogram 07/28/2016 is normal, breast density category be 3. Breast MRI 01/25/2016: Negative, plan to do MRIs every other year  We discussed interaction between Wellbutrin and tamoxifen.  Return to clinic in 1 year for follow-up  No orders of the defined types were placed in this encounter.  The patient has a good understanding of the overall plan. she agrees with it. she will call with any problems that may develop before the next visit here.   Rulon Eisenmenger, MD 09/01/16

## 2016-09-01 NOTE — Assessment & Plan Note (Signed)
Right breast invasive lobular cancer with LCIS T1 N0 M0 stage IA status post lumpectomy which did not show any further evidence of invasive breast cancer. ER positive PR positive HER-2 negative, received adjuvant radiation completed 09/04/2013, started her tamoxifen 20 mg daily from November 2013  Tamoxifen toxicities: Patient denies any hot flashes. She does have myalgias but it may not be related to tamoxifen.  Breast cancer surveillance: 1. Breast exam 09/01/2016 does not reveal any palpable lumps or nodules of concern. 2. Mammogram 07/28/2016 is normal, breast density category be 3. Breast MRI 01/25/2016: Negative  We discussed interaction between Wellbutrin and tamoxifen.

## 2016-09-15 ENCOUNTER — Other Ambulatory Visit (INDEPENDENT_AMBULATORY_CARE_PROVIDER_SITE_OTHER): Payer: Medicare Other

## 2016-09-15 ENCOUNTER — Ambulatory Visit (INDEPENDENT_AMBULATORY_CARE_PROVIDER_SITE_OTHER): Payer: Medicare Other | Admitting: Physician Assistant

## 2016-09-15 ENCOUNTER — Encounter: Payer: Self-pay | Admitting: Physician Assistant

## 2016-09-15 VITALS — BP 140/88 | HR 60 | Wt 135.0 lb

## 2016-09-15 DIAGNOSIS — R1084 Generalized abdominal pain: Secondary | ICD-10-CM | POA: Diagnosis not present

## 2016-09-15 DIAGNOSIS — I2510 Atherosclerotic heart disease of native coronary artery without angina pectoris: Secondary | ICD-10-CM

## 2016-09-15 DIAGNOSIS — R1013 Epigastric pain: Secondary | ICD-10-CM | POA: Diagnosis not present

## 2016-09-15 DIAGNOSIS — R630 Anorexia: Secondary | ICD-10-CM

## 2016-09-15 DIAGNOSIS — R140 Abdominal distension (gaseous): Secondary | ICD-10-CM

## 2016-09-15 LAB — SEDIMENTATION RATE: SED RATE: 1 mm/h (ref 0–30)

## 2016-09-15 LAB — BASIC METABOLIC PANEL
BUN: 13 mg/dL (ref 6–23)
CHLORIDE: 104 meq/L (ref 96–112)
CO2: 29 mEq/L (ref 19–32)
Calcium: 9.6 mg/dL (ref 8.4–10.5)
Creatinine, Ser: 0.89 mg/dL (ref 0.40–1.20)
GFR: 66.7 mL/min (ref >60.00)
Glucose, Bld: 92 mg/dL (ref 70–99)
POTASSIUM: 4.8 meq/L (ref 3.5–5.1)
Sodium: 139 mEq/L (ref 135–145)

## 2016-09-15 LAB — CBC WITH DIFFERENTIAL/PLATELET
BASOS ABS: 0 10*3/uL (ref 0.0–0.1)
Basophils Relative: 0.8 % (ref 0.0–3.0)
EOS ABS: 0.2 10*3/uL (ref 0.0–0.7)
Eosinophils Relative: 4.4 % (ref 0.0–5.0)
HEMATOCRIT: 44 % (ref 36.0–46.0)
Hemoglobin: 14.9 g/dL (ref 12.0–15.0)
LYMPHS ABS: 0.9 10*3/uL (ref 0.7–4.0)
LYMPHS PCT: 23.1 % (ref 12.0–46.0)
MCHC: 33.9 g/dL (ref 30.0–36.0)
MCV: 98.7 fl (ref 78.0–100.0)
Monocytes Absolute: 0.4 10*3/uL (ref 0.1–1.0)
Monocytes Relative: 9.8 % (ref 3.0–12.0)
NEUTROS ABS: 2.5 10*3/uL (ref 1.4–7.7)
NEUTROS PCT: 61.9 % (ref 43.0–77.0)
PLATELETS: 214 10*3/uL (ref 150.0–400.0)
RBC: 4.46 Mil/uL (ref 3.87–5.11)
RDW: 13.5 % (ref 11.5–15.5)
WBC: 4.1 10*3/uL (ref 4.0–10.5)

## 2016-09-15 LAB — C-REACTIVE PROTEIN

## 2016-09-15 MED ORDER — PANTOPRAZOLE SODIUM 40 MG PO TBEC: DELAYED_RELEASE_TABLET | ORAL | 4 refills | Status: DC

## 2016-09-15 MED ORDER — GLYCOPYRROLATE 2 MG PO TABS: 2.0000 mg | ORAL_TABLET | Freq: Two times a day (BID) | ORAL | 4 refills | Status: DC

## 2016-09-15 NOTE — Progress Notes (Signed)
Subjective:    Patient ID: [name], female    DOB: Jan 22, 1946, 70 y.o.   MRN: [id number]  HPI [name] is a pleasant 70 year old white female known to Dr. Leone Payor. She has history of breast cancer which was diagnosed in 2014, she status post lumpectomy radiation and is on tamoxifen. She also has hypertension, IBS and history of colon polyps. Last colonoscopy was done in April 2013 with finding of one small descending colon polyp and left-sided diverticulosis. Path on the polyp consistent with a tubular adenoma and she is to have 5 year interval follow-up. She comes in today with complaints of 6-8 month history of daily symptoms of abdominal bloating and discomfort. She has also been having intermittent episodes of constipation and now over the past month abdominal pain which seems to be more noticeable on an empty stomach. She describes it as a "like a bad stomachache". Her appetite is been somewhat decreased though she's not had any significant weight loss. She denies any nausea or vomiting. She says she just feels bloated and uncomfortable all the time. No melena or hematochezia. She does use when necessary laxatives for constipation. She has been taking meloxicam on a daily basis and frequently using Advil as well. She tries only to use one or the other on a daily basis but occasionally takes both. No heartburn or indigestion no dysphagia. Exline She did have a recent GYN evaluation including pelvic ultrasound because of the abdominal bloating and had an endometrial biopsy which was negative. Taking a daily probiotic, she's tried Gas-X and Phazyme without any benefit.  Review of Systems Pertinent positive and negative review of systems were noted in the above HPI section.  All other review of systems was otherwise negative.  Outpatient Encounter Prescriptions as of 09/15/2016  Medication Sig  . atorvastatin (LIPITOR) 10 MG tablet Take 10 mg by mouth every evening.   Marland Kitchen buPROPion  (WELLBUTRIN XL) 300 MG 24 hr tablet Take 300 mg by mouth daily.  . Echinacea 125 MG CAPS Take 125 mg by mouth as needed (Cold prophylaxis).   . fluticasone (FLONASE) 50 MCG/ACT nasal spray Place 2 sprays into the nose daily.  Marland Kitchen glycopyrrolate (ROBINUL) 2 MG tablet Take 1 tablet (2 mg total) by mouth 2 (two) times daily.  Marland Kitchen ibuprofen (ADVIL) 200 MG tablet Take 400 mg by mouth every 6 (six) hours as needed for pain.   Marland Kitchen lisinopril (PRINIVIL,ZESTRIL) 10 MG tablet Take 1 tablet (10 mg total) by mouth daily.  . meloxicam (MOBIC) 15 MG tablet Take 15 mg by mouth daily.  . metoprolol tartrate (LOPRESSOR) 25 MG tablet Take 1 tablet (25 mg total) by mouth as needed.  . pantoprazole (PROTONIX) 40 MG tablet Take 1 tablet every morning.  . Probiotic Product (PROBIOTIC DAILY PO) Take 1 tablet by mouth daily.  Marland Kitchen SYNTHROID 100 MCG tablet Take 100 mcg by mouth every other day.   Marland Kitchen SYNTHROID 88 MCG tablet Take 88 mcg by mouth every other day.   . tamoxifen (NOLVADEX) 20 MG tablet TAKE 1 TABLET ONCE DAILY.  Marland Kitchen TURMERIC PO Take 2 capsules by mouth daily.  Marland Kitchen zolpidem (AMBIEN) 10 MG tablet Take 10 mg by mouth at bedtime as needed for sleep.   No facility-administered encounter medications on file as of 09/15/2016.    Allergies  Allergen Reactions  . Amoxicillin Diarrhea    diarrhea  . Terak [Terramycin] Rash  . Oxytetracycline Rash   Patient Active Problem List   Diagnosis Date Noted  .  Essential hypertension 08/26/2016  . Mitral valve disorder 08/26/2016  . History of palpitations 08/26/2016  . Dyslipidemia 08/26/2016  . Internal hemorrhoids 08/03/2015  . Rectal bleeding 08/03/2015  . Hx of radiation therapy   . Breast cancer of upper-outer quadrant of right female breast ([facility]) 05/23/2013  . Personal history of colonic polyps 01/06/2012  . Irritable bowel syndrome - constipation predominant 01/06/2012   Social History   Social History  . Marital status: Married    Spouse name: N/A  . Number of  children: 1  . Years of education: N/A   Occupational History  . RN Unemployed   Social History Main Topics  . Smoking status: Never Smoker  . Smokeless tobacco: Never Used  . Alcohol use 0.0 oz/week     Comment: occasional  . Drug use: No  . Sexual activity: Yes     Comment: menarche 66, G 1 age 68, menopause 36, HRT x 9 yrs   Other Topics Concern  . Not on file   Social History Narrative  . No narrative on file    Ms. Fiallo's family history includes Alcohol abuse in her father; Breast cancer in her maternal aunt; COPD in her father; Colon cancer in her maternal grandfather; Diabetes in her paternal grandmother; Heart disease (age of onset: 41) in her mother; Hypertension in her mother; Lung cancer in her mother; Pancreatic cancer in her maternal aunt.      Objective:    Vitals:   09/15/16 0900  BP: 140/88  Pulse: 60    Physical Exam  well-developed older white female in no acute distress, pleasant blood pressure 140/88 pulse 60, BMI 22.1. HEENT; nontraumatic normocephalic EOMI PERRLA sclera anicteric, Cardiovascular; regular rate and rhythm with S1-S2 no murmur or gallop, Pulmonary ;clear bilaterally, Abdomen; soft, no appreciable distention, bowel sounds are present no bruit there is no palpable mass or hepatosplenomegaly she has some mild tenderness in the mid abdomen no guarding or rebound, Rectal ;exam not done, Extremities ;no clubbing cyanosis or edema skin warm and dry, Neuropsych ;mood and affect appropriate       Assessment & Plan:   #51 70 year old white female, retired Engineer, civil (consulting) with history of breast cancer diagnosed 2014, status post lumpectomy, radiation and now on tamoxifen who has history of IBS. She comes in today with complaints of 6-8 month history of abdominal bloating and discomfort, intermittent bouts of constipation and 1 month history of mid abdominal pain/discomfort. Etiology of her symptoms is not entirely clear, she does have prior history of IBS  and this may be a manifestation, rule out NSAID-induced gastropathy/enteropathy, rule out occult intra-abdominal inflammatory process #2 diverticulosis #3 history of adenomatous colon polyps due for follow-up colonoscopy April 2018  Plan; we'll check CBC, BMET sedimentation rate and CRP Schedule for CT of the abdomen and pelvis with contrast Start low gas diet Start Protonix 40 mg by mouth every morning Have asked her to decrease NSAID use as much as possible Start Robinul Forte 2 mg by mouth twice a day Continue daily probiotic If CT is unrevealing and above measures are not helpful will need to consider EGD and early repeat colonoscopy. Patient will follow-up with Dr. Leone Payor myself in about one month.  Briget Shaheed S Odie Edmonds PA-C 09/15/2016   Cc: Catha Gosselin, MD

## 2016-09-15 NOTE — Progress Notes (Signed)
I agree with Ms. Genia Harold management

## 2016-09-15 NOTE — Patient Instructions (Addendum)
Please go to the basement level to have your labs drawn.  Continue the daily Probiotic .  Decrease NSAIDS  ( Advil, Ibuprophen, Aleve, Motrin) as much as possible.  We sent prescriptions to your pharmacy [facility]. 1. Robinul Forte ( Glycopyrolate)  2. Pantoprazole sodium 40 mg.   We made you an appointment with Dr. Silvano Rusk for 12 27 2017 at 2:45 PM.    You have been scheduled for a CT scan of the abdomen and pelvis at [facility] ([address]---this is in the same building as Press [facility]).   You are scheduled on Tuesday 09-20-2016 at 11:30 am. You should arrive at 11: 15 minutes prior to your appointment time for registration. Please follow the written instructions below on the day of your exam:  WARNING: IF YOU ARE ALLERGIC TO IODINE/X-RAY DYE, PLEASE NOTIFY RADIOLOGY IMMEDIATELY AT [phone]! YOU WILL BE GIVEN A 13 HOUR PREMEDICATION PREP.  1) Do not eat or drink anything after 7:30 am (4 hours prior to your test) 2) You have been given 2 bottles of oral contrast to drink. The solution may taste               better if refrigerated, but do NOT add ice or any other liquid to this solution. Shake             well before drinking.    Drink 1 bottle of contrast @ 9:30 am (2 hours prior to your exam)  Drink 1 bottle of contrast @ 10:30 am (1 hour prior to your exam)  You may take any medications as prescribed with a small amount of water except for the following: Metformin, Glucophage, Glucovance, Avandamet, Riomet, Fortamet, Actoplus Met, Janumet, Glumetza or Metaglip. The above medications must be held the day of the exam AND 48 hours after the exam.  The purpose of you drinking the oral contrast is to aid in the visualization of your intestinal tract. The contrast solution may cause some diarrhea. Before your exam is started, you will be given a small amount of fluid to drink. Depending on your individual set of symptoms, you may also receive an intravenous  injection of x-ray contrast/dye. Plan on being at [facility] for 30 minutes or long, depending on the type of exam you are having performed.  If you have any questions regarding your exam or if you need to reschedule, you may call the CT department at [phone] between the hours of 8:00 am and 5:00 pm, Monday-Friday.  ________________________________________________________________________

## 2016-09-18 ENCOUNTER — Encounter: Payer: Self-pay | Admitting: Physician Assistant

## 2016-09-20 ENCOUNTER — Inpatient Hospital Stay: Admission: RE | Admit: 2016-09-20 | Payer: Medicare Other | Source: Ambulatory Visit

## 2016-09-21 ENCOUNTER — Encounter: Payer: Self-pay | Admitting: Cardiology

## 2016-09-23 ENCOUNTER — Ambulatory Visit (INDEPENDENT_AMBULATORY_CARE_PROVIDER_SITE_OTHER)
Admission: RE | Admit: 2016-09-23 | Discharge: 2016-09-23 | Disposition: A | Discharge status: Home / Self Care | Payer: Medicare Other | Source: Ambulatory Visit | Attending: Physician Assistant | Admitting: Physician Assistant

## 2016-09-23 ENCOUNTER — Telehealth: Payer: Self-pay | Admitting: Cardiology

## 2016-09-23 DIAGNOSIS — R1013 Epigastric pain: Secondary | ICD-10-CM | POA: Diagnosis not present

## 2016-09-23 DIAGNOSIS — R140 Abdominal distension (gaseous): Secondary | ICD-10-CM

## 2016-09-23 DIAGNOSIS — R630 Anorexia: Secondary | ICD-10-CM

## 2016-09-23 DIAGNOSIS — R1084 Generalized abdominal pain: Secondary | ICD-10-CM | POA: Diagnosis not present

## 2016-09-23 MED ORDER — IOPAMIDOL (ISOVUE-300) INJECTION 61%
100.0000 mL | Freq: Once | INTRAVENOUS | Status: AC | PRN
  Administered 2016-09-23: 100 mL via INTRAVENOUS

## 2016-09-23 NOTE — Telephone Encounter (Signed)
[name] is calling because her blood pressure is running very high and it does seem to be controlled by her medication . Please call   Thanks

## 2016-09-23 NOTE — Telephone Encounter (Signed)
Spoke to patient. She notes she had sent a mychart message the other day concerning her BPs, also expressed concern because the elevations coincided with onset of use of her protonix and robinul prescribed by GI. Informed patient I would review for possible SE of BP elevation with these medications.  Patient notes BP of 146/103 last night 143/100 this am And she notes trend of [id number] systolic as noted in [employer].  This was sent to Dr. Percival Spanish to review.   Besides a one -time extra dose of lisinopril, patient has not deviated from the 10mg  daily dosage. Will see if warranted to adjust medication in light of elevations. Pt aware I will follow up to advise.

## 2016-09-26 ENCOUNTER — Encounter (INDEPENDENT_AMBULATORY_CARE_PROVIDER_SITE_OTHER): Payer: Self-pay | Admitting: Physical Medicine and Rehabilitation

## 2016-09-26 ENCOUNTER — Ambulatory Visit (INDEPENDENT_AMBULATORY_CARE_PROVIDER_SITE_OTHER): Payer: Medicare Other | Admitting: Physical Medicine and Rehabilitation

## 2016-09-26 VITALS — BP 134/94 | HR 72

## 2016-09-26 DIAGNOSIS — M5416 Radiculopathy, lumbar region: Secondary | ICD-10-CM

## 2016-09-26 DIAGNOSIS — M48062 Spinal stenosis, lumbar region with neurogenic claudication: Secondary | ICD-10-CM | POA: Diagnosis not present

## 2016-09-26 DIAGNOSIS — I2510 Atherosclerotic heart disease of native coronary artery without angina pectoris: Secondary | ICD-10-CM

## 2016-09-26 MED ORDER — LIDOCAINE HCL (PF) 1 % IJ SOLN
0.3300 mL | Freq: Once | INTRAMUSCULAR | Status: AC
  Administered 2016-09-26: 0.3 mL

## 2016-09-26 MED ORDER — METHYLPREDNISOLONE ACETATE 80 MG/ML IJ SUSP
80.0000 mg | Freq: Once | INTRAMUSCULAR | Status: AC
  Administered 2016-09-26: 80 mg

## 2016-09-26 NOTE — Progress Notes (Signed)
Office Visit Note  Patient: [name]           Date of Birth: 1946/01/31           MRN: [id number] Visit Date: 09/26/2016              Requested by: Hulan Fess, MD [place], [place] [address] PCP: Gennette Pac, MD   Assessment & Plan: Visit Diagnoses:  1. Lumbar radiculopathy   2. Spinal stenosis of lumbar region with neurogenic claudication    Chronic worsening low back and right hip and leg pain with some groin pain. No pain with hip rotation internal rotation. I still feel like this probably related to her spine as [name] did get good relief with prior epidural injection at L4-L5 from a transforaminal approach. He could entertain the idea of this is somewhat of a hip pathology just with more of a labral tear type of problem but [name]'s had no trauma. I think at this point the best approach is to repeat the injection. [name] has failed conservative care and time Mrs. been worsening over the last several months.  I spent more than 20 minutes speaking face-to-face with the patient with 50% of the time in counseling.  Follow-Up Instructions: Return if symptoms worsen or fail to improve, for Recheck spine.  Orders:  Orders Placed This Encounter  Procedures  . Epidural Steroid injection    Meds ordered this encounter  Medications  . lidocaine (PF) (XYLOCAINE) 1 % injection 0.3 mL  . methylPREDNISolone acetate (DEPO-MEDROL) injection 80 mg      Procedures: Lumbosacral Transforaminal Epidural Steroid Injection - Infraneural Approach with Fluoroscopic Guidance  Patient: [name]      Date of Birth: 05/19/1946 MRN: [id number] PCP: Gennette Pac, MD      Visit Date: 09/26/2016   Universal Protocol:    Date/Time: 09/26/1709:00 AM  Consent Given By: the patient  Position: PRONE   Additional Comments: Vital signs were monitored before and after the procedure. Patient was prepped and draped in the usual sterile fashion. The correct patient,  procedure, and site was verified.   Injection Procedure Details:  Procedure Site One Meds Administered:  Meds ordered this encounter  Medications  . lidocaine (PF) (XYLOCAINE) 1 % injection 0.3 mL  . methylPREDNISolone acetate (DEPO-MEDROL) injection 80 mg     Laterality: Left  Location/Site:  L4-L5 L5-S1  Needle size: 22 G  Needle type: Spinal  Needle Placement: Transforaminal  Findings:  -Contrast Used: 2 mL iohexol 180 mg iodine/mL   -Comments: Excellent flow of contrast along the nerve and into the epidural space.  Procedure Details: After squaring off the end-plates of the desired vertebral level to get a true AP view, the C-arm was obliqued to the painful side so that the superior articulating process is positioned about 1/3 the length of the inferior endplate.  The needle was aimed toward the junction of the superior articular process and the transverse process of the inferior vertebrae. The needle's initial entry is in the lower third of the foramen through Kambin's triangle. The soft tissues overlying this target were infiltrated with 2-3 ml. of 1% Lidocaine without Epinephrine.  The spinal needle was then inserted and advanced toward the target using a "trajectory" view along the fluoroscope beam.  Under AP and lateral visualization, the needle was advanced so it did not puncture dura and did not traverse medially beyond the 6 o'clock position of the pedicle. Bi-planar projections were used  to confirm position. Aspiration was confirmed to be negative for CSF and/or blood. A 1-2 ml. volume of Isovue-250 was injected and flow of contrast was noted at each level. Radiographs were obtained for documentation purposes.   After attaining the desired flow of contrast documented above, a 0.5 to 1.0 ml test dose of 0.25% Marcaine was injected into each respective transforaminal space.  The patient was observed for 90 seconds post injection.  After no sensory deficits were reported,  and normal lower extremity motor function was noted,   the above injectate was administered so that equal amounts of the injectate were placed at each foramen (level) into the transforaminal epidural space.   Additional Comments:  The patient tolerated the procedure well   [name] had a little bit more issue at L5 with flow of medication causing some referred pain in an L5 distribution which is normal. [name] felt like it was a little worse this time that the time 2016. Dressing: Band-Aid    Post-procedure details: Patient was observed during the procedure. Post-procedure instructions were reviewed.  Patient left the clinic in stable condition.   Other Procedures: No procedures performed   Clinical Data: No additional findings.   Subjective: Chief Complaint  Patient presents with  . Lower Back - Pain   [name] is a 70 year old female with chronic history of low back and right hip and thigh pain. [name] is getting some pain in the groin. [name] said no history of hip problems. [name]'s been seen in the past by Dr. Durward Fortes and has been treated for knee osteoarthritis. We saw her almost a year ago completed right L4-L5 transforaminal epidural injection for stenosis at L4-5 with good relief of most 100% relief for several months. [name] had a period where did flareup once again but then the symptoms were more well known. [name]'s had really worsening symptoms over the last few months. Pain right buttock, thigh, and into groin. Mainly with walking long periods and exercising. Denied numbness, tingling, or burning.  Takes no blood thinners and no dye allergy  [name] has no specific history of recent trauma. [name] has no focal weakness or bowel or bladder symptoms. [name] states the symptoms feel similar but [name]'s had fairly acute onset over the last several months and just worsening pain not relieved with anti-inflammatories and rest.  Review of Systems  Constitutional: Negative for chills, fatigue, fever and  unexpected weight change.  HENT: Negative for sore throat and trouble swallowing.   Eyes: Negative for photophobia and visual disturbance.  Respiratory: Negative for chest tightness and shortness of breath.   Cardiovascular: Negative for chest pain.  Gastrointestinal: Negative for abdominal pain.  Endocrine: Negative for cold intolerance and heat intolerance.  Musculoskeletal: Negative for myalgias.  Skin: Negative for color change and rash.  Neurological: Negative for speech difficulty and headaches.  Psychiatric/Behavioral: Negative for confusion. The patient is not nervous/anxious.   All other systems reviewed and are negative.    Objective: Vital Signs: BP (!) 134/94   Pulse 72   SpO2 98%    Physical Exam  Constitutional: [name] is oriented to person, place, and time. [name] appears well-developed and well-nourished. No distress.  HENT:  Head: Normocephalic and atraumatic.  Nose: Nose normal.  Mouth/Throat: Oropharynx is clear and moist.  Eyes: Conjunctivae are normal. Pupils are equal, round, and reactive to light.  Neck: Normal range of motion. Neck supple.  Cardiovascular: Regular rhythm and intact distal pulses.   Pulmonary/Chest: Effort normal and breath sounds normal.  Abdominal: [name] exhibits no distension.  Neurological: [name] is alert and oriented to person, place, and time.  Skin: Skin is warm.  Psychiatric: [name] has a normal mood and affect. Her behavior is normal.  Nursing note and vitals reviewed.  The patient ambulates without aid with a fairly normal gait. [name] is rising from a seated position fairly normally. [name] has concordant pain with extension of the lumbar spine. [name] has full 5 out of 5 strength in all muscle groups of lower extremities bilaterally without deficits. [name] has no pain with hip rotation internal or external bilaterally. Ortho Exam  Specialty Comments:  No specialty comments available. Imaging: No results found.   PMFS History: Patient Active  Problem List   Diagnosis Date Noted  . Essential hypertension 08/26/2016  . Mitral valve disorder 08/26/2016  . History of palpitations 08/26/2016  . Dyslipidemia 08/26/2016  . Internal hemorrhoids 08/03/2015  . Rectal bleeding 08/03/2015  . Hx of radiation therapy   . Breast cancer of upper-outer quadrant of right female breast ([place]) 05/23/2013  . Personal history of colonic polyps 01/06/2012  . Irritable bowel syndrome - constipation predominant 01/06/2012   Past Medical History:  Diagnosis Date  . Adenomatous colon polyp 03/07/2012  . Allergic rhinitis   . Arthritis   . Breast cancer ([place]) 05/20/13   right  . Diverticulosis   . Graves' disease    2011  . HLD (hyperlipidemia)   . Hx of radiation therapy 08/13/13- 09/06/13   right breast 4250 cGy 17 sessions  . IBS (irritable bowel syndrome)     Family History  Problem Relation Age of Onset  . Heart disease Mother 49    MI  . Lung cancer Mother   . Hypertension Mother   . Alcohol abuse Father   . COPD Father   . Colon cancer Maternal Grandfather   . Diabetes Paternal Grandmother   . Breast cancer Maternal Aunt   . Pancreatic cancer Maternal Aunt    Past Surgical History:  Procedure Laterality Date  . BREAST BIOPSY Right   . BREAST LUMPECTOMY     06/11/13  with lymph node biopsy  . BREAST LUMPECTOMY WITH NEEDLE LOCALIZATION AND AXILLARY SENTINEL LYMPH NODE BX Right 06/11/2013   Procedure: BREAST LUMPECTOMY WITH NEEDLE LOCALIZATION AND AXILLARY SENTINEL LYMPH NODE BX;  Surgeon: Harl Bowie, MD;  Location: [facility];  Service: General;  Laterality: Right;  Needle localization BCG 7:30   . COLONOSCOPY W/ BIOPSIES AND POLYPECTOMY  multiple  . KNEE ARTHROSCOPY Left    Social History   Occupational History  . RN Unemployed   Social History Main Topics  . Smoking status: Never Smoker  . Smokeless tobacco: Never Used  . Alcohol use 0.0 oz/week     Comment: occasional  . Drug use: No  . Sexual activity: Yes      Comment: menarche 40, G 1 age 63, menopause 89, HRT x 9 yrs

## 2016-09-26 NOTE — Procedures (Signed)
Lumbosacral Transforaminal Epidural Steroid Injection - Infraneural Approach with Fluoroscopic Guidance  Patient: [name]      Date of Birth: Apr 14, 1946 MRN: [id number] PCP: Gennette Pac, MD      Visit Date: 09/26/2016   Universal Protocol:    Date/Time: 09/26/1709:00 AM  Consent Given By: the patient  Position: PRONE   Additional Comments: Vital signs were monitored before and after the procedure. Patient was prepped and draped in the usual sterile fashion. The correct patient, procedure, and site was verified.   Injection Procedure Details:  Procedure Site One Meds Administered:  Meds ordered this encounter  Medications  . lidocaine (PF) (XYLOCAINE) 1 % injection 0.3 mL  . methylPREDNISolone acetate (DEPO-MEDROL) injection 80 mg     Laterality: Left  Location/Site:  L4-L5 L5-S1  Needle size: 22 G  Needle type: Spinal  Needle Placement: Transforaminal  Findings:  -Contrast Used: 2 mL iohexol 180 mg iodine/mL   -Comments: Excellent flow of contrast along the nerve and into the epidural space.  Procedure Details: After squaring off the end-plates of the desired vertebral level to get a true AP view, the C-arm was obliqued to the painful side so that the superior articulating process is positioned about 1/3 the length of the inferior endplate.  The needle was aimed toward the junction of the superior articular process and the transverse process of the inferior vertebrae. The needle's initial entry is in the lower third of the foramen through Kambin's triangle. The soft tissues overlying this target were infiltrated with 2-3 ml. of 1% Lidocaine without Epinephrine.  The spinal needle was then inserted and advanced toward the target using a "trajectory" view along the fluoroscope beam.  Under AP and lateral visualization, the needle was advanced so it did not puncture dura and did not traverse medially beyond the 6 o'clock position of the pedicle. Bi-planar  projections were used to confirm position. Aspiration was confirmed to be negative for CSF and/or blood. A 1-2 ml. volume of Isovue-250 was injected and flow of contrast was noted at each level. Radiographs were obtained for documentation purposes.   After attaining the desired flow of contrast documented above, a 0.5 to 1.0 ml test dose of 0.25% Marcaine was injected into each respective transforaminal space.  The patient was observed for 90 seconds post injection.  After no sensory deficits were reported, and normal lower extremity motor function was noted,   the above injectate was administered so that equal amounts of the injectate were placed at each foramen (level) into the transforaminal epidural space.   Additional Comments:  The patient tolerated the procedure well   she had a little bit more issue at L5 with flow of medication causing some referred pain in an L5 distribution which is normal. She felt like it was a little worse this time that the time 2016. Dressing: Band-Aid    Post-procedure details: Patient was observed during the procedure. Post-procedure instructions were reviewed.  Patient left the clinic in stable condition.

## 2016-09-26 NOTE — Patient Instructions (Signed)

## 2016-09-30 NOTE — Telephone Encounter (Signed)
Please call and see if her BPs are any better over the last week.

## 2016-10-03 ENCOUNTER — Telehealth: Payer: Self-pay | Admitting: Cardiology

## 2016-10-03 DIAGNOSIS — L814 Other melanin hyperpigmentation: Secondary | ICD-10-CM | POA: Diagnosis not present

## 2016-10-03 DIAGNOSIS — D1801 Hemangioma of skin and subcutaneous tissue: Secondary | ICD-10-CM | POA: Diagnosis not present

## 2016-10-03 DIAGNOSIS — D225 Melanocytic nevi of trunk: Secondary | ICD-10-CM | POA: Diagnosis not present

## 2016-10-03 DIAGNOSIS — Z23 Encounter for immunization: Secondary | ICD-10-CM | POA: Diagnosis not present

## 2016-10-03 DIAGNOSIS — L821 Other seborrheic keratosis: Secondary | ICD-10-CM | POA: Diagnosis not present

## 2016-10-03 DIAGNOSIS — Z85828 Personal history of other malignant neoplasm of skin: Secondary | ICD-10-CM | POA: Diagnosis not present

## 2016-10-03 NOTE — Telephone Encounter (Signed)
New message   Pt verbalized that she is returning call to rn

## 2016-10-03 NOTE — Telephone Encounter (Signed)
Returned call to patient.She stated for the past 2 weeks her diastolic B/P has been greater than 90.Stated this morning B/P 134/95.Pulse ranges in 50's.Stated she is dizzy when B/P elevated.She takes a extra Lisinopril 10 mg when B/P elevated or she will take Metoprolol 25 mg 1/2 tablet.She wanted to ask Dr.Hochrein to increase medications.Message sent to [place] for advice.

## 2016-10-03 NOTE — Telephone Encounter (Signed)
Left msg to call.

## 2016-10-05 NOTE — Telephone Encounter (Signed)
Closed encounter. See more recent telephone note.

## 2016-10-06 ENCOUNTER — Telehealth (INDEPENDENT_AMBULATORY_CARE_PROVIDER_SITE_OTHER): Payer: Self-pay | Admitting: Physical Medicine and Rehabilitation

## 2016-10-06 MED ORDER — PREDNISONE 50 MG PO TABS: ORAL_TABLET | ORAL | 0 refills | Status: DC

## 2016-10-06 NOTE — Telephone Encounter (Signed)
She has moderate multifactorial stenosis at L4-5 with lateral recess narrowing bilaterally. I will go ahead and set her up for a right L4-5 or L5-S1 intralaminar epidural steroid injection.

## 2016-10-06 NOTE — Telephone Encounter (Signed)
Prednisone 50 mg by mouth once a day for 5 days was sent to her pharmacy.

## 2016-10-06 NOTE — Telephone Encounter (Signed)
Called patient and will wait for patient to call to scheduled injection.

## 2016-10-06 NOTE — Telephone Encounter (Signed)
I called the patient and left a message for her to call back to schedule.

## 2016-10-12 NOTE — Telephone Encounter (Signed)
If her DBP is still elevated she can start taking 15 mg of lisinopril daily.

## 2016-10-18 NOTE — Telephone Encounter (Signed)
Leave message for pt to call back 

## 2016-10-19 ENCOUNTER — Telehealth: Payer: Self-pay | Admitting: Cardiology

## 2016-10-19 ENCOUNTER — Encounter: Payer: Self-pay | Admitting: Cardiology

## 2016-10-19 NOTE — Telephone Encounter (Signed)
Mrs. Bracey is returning your call . Please call

## 2016-10-19 NOTE — Telephone Encounter (Signed)
New message  Pt is returning [name]'s call  Please call back

## 2016-10-20 NOTE — Telephone Encounter (Signed)
Pt send out a Scientist, research (medical)  ----- Message -----  From: [name]  Sent: 10/19/2016 10:58 PM  To: [name] Triage  Subject: Visit Follow-Up Question               I know that your office has been trying to reach me. [place] and since I've been back I haven't been able to answer my phone when [name] called. I assume you are calling about my blood pressure since I last talked to someone when it was rather high. Recently it has been very good - 110-130/70-85. I am continuing to take the Lisinopril 10 mg daily. I'm not sure what caused it to go up previously but for right now it's good. Thank you.    [name]

## 2016-10-20 NOTE — Telephone Encounter (Signed)
Called pt several times, unable to reach pt. Pt send out a Scientist, research (medical)  ----- Message -----  From: [name]  Sent: 10/19/2016 10:58 PM  To: [name] Triage  Subject: Visit Follow-Up Question               I know that your office has been trying to reach me. [place]'ve been out of the [place] and since I've been back I haven't been able to answer my phone when [name] called. I assume you are calling about my blood pressure since I last talked to someone when it was rather high. Recently it has been very good - 110-130/70-85. I am continuing to take the Lisinopril 10 mg daily. I'm not sure what caused it to go up previously but for right now it's good. Thank you.    Clover Mealy

## 2016-11-04 DIAGNOSIS — R310 Gross hematuria: Secondary | ICD-10-CM | POA: Diagnosis not present

## 2016-11-04 DIAGNOSIS — N200 Calculus of kidney: Secondary | ICD-10-CM | POA: Diagnosis not present

## 2016-11-16 ENCOUNTER — Encounter: Payer: Self-pay | Admitting: Internal Medicine

## 2016-11-16 ENCOUNTER — Ambulatory Visit (INDEPENDENT_AMBULATORY_CARE_PROVIDER_SITE_OTHER): Payer: Medicare Other | Admitting: Internal Medicine

## 2016-11-16 VITALS — BP 118/80 | HR 64 | Ht 65.0 in | Wt 136.2 lb

## 2016-11-16 DIAGNOSIS — I2510 Atherosclerotic heart disease of native coronary artery without angina pectoris: Secondary | ICD-10-CM

## 2016-11-16 DIAGNOSIS — K581 Irritable bowel syndrome with constipation: Secondary | ICD-10-CM | POA: Diagnosis not present

## 2016-11-16 DIAGNOSIS — Z86010 Personal history of colonic polyps: Secondary | ICD-10-CM | POA: Diagnosis not present

## 2016-11-16 DIAGNOSIS — R140 Abdominal distension (gaseous): Secondary | ICD-10-CM | POA: Diagnosis not present

## 2016-11-16 NOTE — Progress Notes (Signed)
MAEBELLE DIMMICK 70 y.o. August 30, 1946 [id number]  Assessment & Plan:   Encounter Diagnoses  Name Primary?  . Abdominal bloating Yes  . Irritable bowel syndrome with constipation   . Hx of colonic polyps     Her sxs are chronic but apparently worsened some this year. Will evaluate with lactulose hydrogen breath test  Hold glycopyrrolate  Stay on PPi for now  Colonoscopy for polyp surveillance (due Spring 2018 so now ok) after above Reassure She did really well w/ Zelnorm, 290ug Linzess too much caused diarrhea - ? Amitiza vs lower dose Linzess, vs emycin (? Allergy to mycins)  She has been on probiotics  Response to Zelnorm in past suggests major problem is motility but unfortunately no agents like that or itself available. Will see if bacterial overgrowth an issue.  I appreciate the opportunity to care for this patient. [id number] Marigene Ehlers, MD Dr. 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 Saw Amy Esterwood in Oct 2017 - started on pantoprazole and glycopyrrolate and had CT abd/pelvis - intrarenal kidney stone and mild hydronephrosis and thickened endometrium - all known issues and is s/p endometrial bx that was negative  She has irregular defecation - may go daily x 7 d then days w/o Feels better w/ less bloating when regular BMs but still has some then Also some anorexia and early satiety Denies depression and is on Tx Some stress/anxiety   Wt Readings from Last 3 Encounters:  11/16/16 136 lb 4 oz (61.8 kg)  09/15/16 135 lb (61.2 kg)  09/01/16 137 lb 8 oz (62.4 kg)   Allergies  Allergen Reactions  . Amoxicillin Diarrhea    diarrhea  . Terak [Terramycin] Rash  . Oxytetracycline Rash   Outpatient Medications Prior to Visit  Medication Sig Dispense Refill  . atorvastatin (LIPITOR) 10 MG tablet Take 10 mg by mouth every evening.     Marland Kitchen buPROPion (WELLBUTRIN XL)  300 MG 24 hr tablet Take 300 mg by mouth daily.    . Echinacea 125 MG CAPS Take 125 mg by mouth as needed (Cold prophylaxis).     . fluticasone (FLONASE) 50 MCG/ACT nasal spray Place 2 sprays into the nose daily.    Marland Kitchen glycopyrrolate (ROBINUL) 2 MG tablet Take 1 tablet (2 mg total) by mouth 2 (two) times daily. (Patient taking differently: Take 0.5 mg by mouth daily at 2 PM. ) 60 tablet 4  . ibuprofen (ADVIL) 200 MG tablet Take 400 mg by mouth every 6 (six) hours as needed for pain.     Marland Kitchen lisinopril (PRINIVIL,ZESTRIL) 10 MG tablet Take 1 tablet (10 mg total) by mouth daily. 30 tablet 11  . meloxicam (MOBIC) 15 MG tablet Take 15 mg by mouth daily.    . metoprolol tartrate (LOPRESSOR) 25 MG tablet Take 1 tablet (25 mg total) by mouth as needed. 30 tablet 3  . pantoprazole (PROTONIX) 40 MG tablet Take 1 tablet every morning. 30 tablet 4  . Probiotic Product (PROBIOTIC DAILY PO) Take 1 tablet by mouth daily.    Marland Kitchen SYNTHROID 100 MCG tablet Take 100 mcg by mouth every other day.     Marland Kitchen SYNTHROID 88 MCG tablet Take 88 mcg by mouth every other day.     . tamoxifen (NOLVADEX) 20 MG tablet TAKE 1 TABLET ONCE DAILY. 90 tablet 3  . TURMERIC PO Take 2 capsules by mouth daily.    Marland Kitchen zolpidem (AMBIEN)  10 MG tablet Take 10 mg by mouth at bedtime as needed for sleep.    . predniSONE (DELTASONE) 50 MG tablet Take 1 tablet daily with food for 5 days until finished 5 tablet 0   No facility-administered medications prior to visit.    Past Medical History:  Diagnosis Date  . Adenomatous colon polyp 03/07/2012  . Allergic rhinitis   . Arthritis   . Breast cancer ([place]) 05/20/13   right  . Diverticulosis   . Graves' disease    2011  . HLD (hyperlipidemia)   . Hx of radiation therapy 08/13/13- 09/06/13   right breast 4250 cGy 17 sessions  . IBS (irritable bowel syndrome)    Past Surgical History:  Procedure Laterality Date  . BREAST BIOPSY Right   . BREAST LUMPECTOMY     06/11/13  with lymph node biopsy  .  BREAST LUMPECTOMY WITH NEEDLE LOCALIZATION AND AXILLARY SENTINEL LYMPH NODE BX Right 06/11/2013   Procedure: BREAST LUMPECTOMY WITH NEEDLE LOCALIZATION AND AXILLARY SENTINEL LYMPH NODE BX;  Surgeon: Harl Bowie, MD;  Location: [facility];  Service: General;  Laterality: Right;  Needle localization BCG 7:30   . COLONOSCOPY W/ BIOPSIES AND POLYPECTOMY  multiple  . KNEE ARTHROSCOPY Left    Social History   Social History  . Marital status: Married    Spouse name: N/A  . Number of children: 1  . Years of education: N/A   Occupational History  . RN Unemployed   Social History Main Topics  . Smoking status: Never Smoker  . Smokeless tobacco: Never Used  . Alcohol use 0.0 oz/week     Comment: occasional  . Drug use: No  . Sexual activity: Yes     Comment: menarche 62, G 1 age 44, menopause 69, HRT x 9 yrs    Review of Systems As above  Objective:   Physical Exam BP 118/80   Pulse 64   Ht 5\' 5"  (1.651 m)   Wt 136 lb 4 oz (61.8 kg)   BMI 22.67 kg/m  NAD   15 minutes time spent with patient > half in counseling coordination of care

## 2016-11-16 NOTE — Patient Instructions (Addendum)
   Please stop taking your glycopyrrolate for now and we are going to order this [employer] lactulose breath test.  The company will be in touch with you to mail you a kit.    I appreciate the opportunity to care for you. Silvano Rusk, MD, [facility]

## 2016-12-02 ENCOUNTER — Encounter: Payer: Self-pay | Admitting: Internal Medicine

## 2016-12-14 ENCOUNTER — Other Ambulatory Visit: Payer: Self-pay | Admitting: Internal Medicine

## 2016-12-14 ENCOUNTER — Telehealth: Payer: Self-pay

## 2016-12-14 DIAGNOSIS — K589 Irritable bowel syndrome without diarrhea: Secondary | ICD-10-CM

## 2016-12-14 MED ORDER — RIFAXIMIN 550 MG PO TABS: 550.0000 mg | ORAL_TABLET | Freq: Three times a day (TID) | ORAL | 0 refills | Status: DC

## 2016-12-14 NOTE — Telephone Encounter (Signed)
-----   Message from Gatha Mayer, MD sent at 12/14/2016  1:36 PM EST ----- Regarding: SIBO Let her know breath test was +  Try Xifaxan 550 mg tid - I sent Rx to [facility] - if too costly we can try something different Schedule REV me March/April

## 2016-12-14 NOTE — Telephone Encounter (Signed)
Patient notified of the results She will call back if too expensive She will call back in [facility] Feb for a mid March- April appt

## 2016-12-15 ENCOUNTER — Telehealth: Payer: Self-pay

## 2016-12-15 MED ORDER — METRONIDAZOLE 500 MG PO TABS: 500.0000 mg | ORAL_TABLET | Freq: Three times a day (TID) | ORAL | 0 refills | Status: DC

## 2016-12-15 MED ORDER — CEPHALEXIN 500 MG PO CAPS: 500.0000 mg | ORAL_CAPSULE | Freq: Three times a day (TID) | ORAL | 0 refills | Status: DC

## 2016-12-15 NOTE — Telephone Encounter (Signed)
Metronidazole 500 tid x 14 days Cephalexin 500 mg tid x 14 days

## 2016-12-15 NOTE — Telephone Encounter (Signed)
Her insurance doesn't want to cover the [place].  Can you please tell me an alternative for her bacterial overgrowth, thank you.

## 2016-12-15 NOTE — Telephone Encounter (Signed)
I have informed [facility] to cancel the xifaxan and we are sending in generic flagyl and genic keflex to replace it.  I called and told Janasia as well or the change.

## 2016-12-21 DIAGNOSIS — N200 Calculus of kidney: Secondary | ICD-10-CM | POA: Diagnosis not present

## 2016-12-22 ENCOUNTER — Encounter: Payer: Self-pay | Admitting: Internal Medicine

## 2017-01-18 ENCOUNTER — Telehealth (INDEPENDENT_AMBULATORY_CARE_PROVIDER_SITE_OTHER): Payer: Self-pay | Admitting: Physical Medicine and Rehabilitation

## 2017-01-18 DIAGNOSIS — M792 Neuralgia and neuritis, unspecified: Secondary | ICD-10-CM

## 2017-01-19 MED ORDER — GABAPENTIN 300 MG PO CAPS: ORAL_CAPSULE | ORAL | 0 refills | Status: DC

## 2017-01-19 NOTE — Telephone Encounter (Signed)
Called patient and left message.

## 2017-01-19 NOTE — Addendum Note (Signed)
Addended by: Raymondo Band on: 01/19/2017 01:32 PM   Modules accepted: Orders

## 2017-01-19 NOTE — Telephone Encounter (Signed)
Will call patient to scheduled when c-arm working. Message re prescription request sent to Dr. Ernestina Patches.

## 2017-01-19 NOTE — Telephone Encounter (Signed)
Gabapentin sent in to Pharmcacy, please tell her it can make you drowsy at first so follow instructions, very safe medication otherwise, call if questions.

## 2017-02-06 ENCOUNTER — Ambulatory Visit (INDEPENDENT_AMBULATORY_CARE_PROVIDER_SITE_OTHER): Payer: Medicare Other | Admitting: Physical Medicine and Rehabilitation

## 2017-02-06 ENCOUNTER — Encounter (INDEPENDENT_AMBULATORY_CARE_PROVIDER_SITE_OTHER): Payer: Self-pay | Admitting: Physical Medicine and Rehabilitation

## 2017-02-06 ENCOUNTER — Ambulatory Visit (INDEPENDENT_AMBULATORY_CARE_PROVIDER_SITE_OTHER): Payer: Medicare Other

## 2017-02-06 VITALS — BP 137/92 | HR 62

## 2017-02-06 DIAGNOSIS — M5416 Radiculopathy, lumbar region: Secondary | ICD-10-CM | POA: Diagnosis not present

## 2017-02-06 MED ORDER — METHYLPREDNISOLONE ACETATE 80 MG/ML IJ SUSP
80.0000 mg | Freq: Once | INTRAMUSCULAR | Status: AC
  Administered 2017-02-06: 80 mg

## 2017-02-06 MED ORDER — LIDOCAINE HCL (PF) 1 % IJ SOLN
0.3300 mL | Freq: Once | INTRAMUSCULAR | Status: AC
  Administered 2017-02-06: 0.3 mL

## 2017-02-06 NOTE — Progress Notes (Signed)
[name] - 71 y.o. female MRN [id number]  Date of birth: Feb 16, 1946  Office Visit Note: Visit Date: 02/06/2017 PCP: Gennette Pac, MD Referred by: Hulan Fess, MD  Subjective: Chief Complaint  Patient presents with  . Lower Back - Pain   HPI: [name] is a 71 year old female that I saw a couple of years ago and completed an epidural injection that was very beneficial for her for quite some time. We've recently started seeing her again over the last several months and epidural injection was not as successful but didn't help. She has had ongoing symptoms of low back pain which is fairly mild and most of her pain is really radicular-type pain in the buttock and leg. Most of her pain right now is on the left side with no new trauma. She's had worsening over the last several months. Bilateral buttock and thigh pain. Worse on left side. States she has no actual back pain. Increased pain for 1 month and worse at night with laying down. No numbness or tingling. In November she did take a trip to see her son who lives in [place] and we did give her a small prescription for prednisone seemed to help her quite a bit. Her pain is interesting in the fact that it is really not dermatomal specific. MRI findings show moderate stenosis at L4-5 with facet arthropathy. She called in recently requesting prescription for gabapentin which we did give her even though he really had not sat down and talked about gabapentin. We talked today and it turns out that her husband has a psychiatrist. The patient is on Wellbutrin. She tells me the gabapentin didn't really seem to help that much and it did make her feel a little bit woozy when she took it. She did take 300 mg at night as directed. We set today and talked about how to use the gabapentin and decided right now that she probably should discontinue this if it's making her feel woozy. I did discuss that it might be something we would try can really we need  to sit and talk about medications before we prescribe those. She is asking about Cymbalta the day and actually discussed this with her husband who said that she could take that along with her current SSRI. Again we have had no specific discussions on that medication at this point but I did talk briefly about that. We have brought her in today for epidural injection from an intralaminar approach is to see if that would be of more benefit. She continues to take meloxicam and tumeric.    ROS Otherwise per HPI.  Assessment & Plan: Visit Diagnoses:  1. Lumbar radiculopathy     Plan: Findings:  Left L4-5 interlaminar epidural steroid injection. Depending on relief she gets O have her come back from office visits discussed medication management. She's been to discontinue the gabapentin.    Meds & Orders:  Meds ordered this encounter  Medications  . lidocaine (PF) (XYLOCAINE) 1 % injection 0.3 mL  . methylPREDNISolone acetate (DEPO-MEDROL) injection 80 mg    Orders Placed This Encounter  Procedures  . XR C-ARM NO REPORT  . Epidural Steroid injection    Follow-up: Return , 2 weeks, may need new MRI.   Procedures: No procedures performed  Lumbar Epidural Steroid Injection - Interlaminar Approach with Fluoroscopic Guidance  Patient: [name]      Date of Birth: 1946-02-24 MRN: [id number] PCP: Gennette Pac, MD  Visit Date: 02/06/2017   Universal Protocol:    Date/Time: 03/21/185:22 AM  Consent Given By: the patient  Position: PRONE  Additional Comments: Vital signs were monitored before and after the procedure. Patient was prepped and draped in the usual sterile fashion. The correct patient, procedure, and site was verified.   Injection Procedure Details:  Procedure Site One Meds Administered:  Meds ordered this encounter  Medications  . lidocaine (PF) (XYLOCAINE) 1 % injection 0.3 mL  . methylPREDNISolone acetate (DEPO-MEDROL) injection 80 mg      Laterality: Left  Location/Site:  L4-L5  Needle size: 20 G  Needle type: Tuohy  Needle Placement: Paramedian epidural  Findings:  -Contrast Used: 1 mL iohexol 180 mg iodine/mL   -Comments: Excellent flow of contrast into the epidural space.  Procedure Details: Using a paramedian approach from the side mentioned above, the region overlying the inferior lamina was localized under fluoroscopic visualization and the soft tissues overlying this structure were infiltrated with 4 ml. of 1% Lidocaine without Epinephrine. The Tuohy needle was inserted into the epidural space using a paramedian approach.   The epidural space was localized using loss of resistance along with lateral and bi-planar fluoroscopic views.  After negative aspirate for air, blood, and CSF, a 2 ml. volume of Isovue-250 was injected into the epidural space and the flow of contrast was observed. Radiographs were obtained for documentation purposes.    The injectate was administered into the level noted above.   Additional Comments:  The patient tolerated the procedure well Dressing: Band-Aid    Post-procedure details: Patient was observed during the procedure. Post-procedure instructions were reviewed.  Patient left the clinic in stable condition.    Clinical History: Lumbar spine MRI 07/03/2015   L4-5: Prominent circumferential disc bulging and moderate facet and ligamentum flavum hypertrophy result in moderate spinal stenosis, moderate bilateral lateral recess stenosis, and severe right and moderate left neural foraminal stenosis. The L5 nerve roots could be affected in the lateral recesses, and the right L4 nerve root may be affected in the neural foramen.  L5-S1: Mild disc bulging and moderate facet arthrosis result in minimal bilateral neural foraminal stenosis without spinal stenosis.  IMPRESSION: Multilevel lumbar disc and facet degeneration, most notable at L4-5 where there is moderate  spinal stenosis, bilateral lateral recess stenosis, and severe right neural foraminal stenosis.  She reports that she has never smoked. She has never used smokeless tobacco. No results for input(s): HGBA1C, LABURIC in the last 8760 hours.  Objective:  VS:  HT:    WT:   BMI:     BP:(!) 137/92  HR:62bpm  TEMP: ( )  RESP:100 % Physical Exam  Musculoskeletal:  The patient ambulates without aid with a forward flexed spine with good distal strength.    Ortho Exam Imaging: No results found.  Past Medical/Family/Surgical/Social History: Medications & Allergies reviewed per EMR Patient Active Problem List   Diagnosis Date Noted  . Essential hypertension 08/26/2016  . Mitral valve disorder 08/26/2016  . History of palpitations 08/26/2016  . Dyslipidemia 08/26/2016  . Internal hemorrhoids 08/03/2015  . Rectal bleeding 08/03/2015  . Hx of radiation therapy   . Breast cancer of upper-outer quadrant of right female breast ([facility]) 05/23/2013  . Personal history of colonic polyps 01/06/2012  . Irritable bowel syndrome - constipation predominant 01/06/2012   Past Medical History:  Diagnosis Date  . Adenomatous colon polyp 03/07/2012  . Allergic rhinitis   . Arthritis   . Breast cancer ([facility]) 05/20/13  right  . Diverticulosis   . Graves' disease    2011  . HLD (hyperlipidemia)   . Hx of radiation therapy 08/13/13- 09/06/13   right breast 4250 cGy 17 sessions  . IBS (irritable bowel syndrome)    Family History  Problem Relation Age of Onset  . Heart disease Mother 2    MI  . Lung cancer Mother   . Hypertension Mother   . Alcohol abuse Father   . COPD Father   . Colon cancer Maternal Grandfather   . Diabetes Paternal Grandmother   . Breast cancer Maternal Aunt   . Pancreatic cancer Maternal Aunt    Past Surgical History:  Procedure Laterality Date  . BREAST BIOPSY Right   . BREAST LUMPECTOMY     06/11/13  with lymph node biopsy  . BREAST LUMPECTOMY WITH NEEDLE LOCALIZATION  AND AXILLARY SENTINEL LYMPH NODE BX Right 06/11/2013   Procedure: BREAST LUMPECTOMY WITH NEEDLE LOCALIZATION AND AXILLARY SENTINEL LYMPH NODE BX;  Surgeon: Harl Bowie, MD;  Location: [facility];  Service: General;  Laterality: Right;  Needle localization BCG 7:30   . COLONOSCOPY W/ BIOPSIES AND POLYPECTOMY  multiple  . KNEE ARTHROSCOPY Left    Social History   Occupational History  . RN Unemployed   Social History Main Topics  . Smoking status: Never Smoker  . Smokeless tobacco: Never Used  . Alcohol use 0.0 oz/week     Comment: occasional  . Drug use: No  . Sexual activity: Yes     Comment: menarche 31, G 1 age 67, menopause 72, HRT x 9 yrs

## 2017-02-06 NOTE — Patient Instructions (Signed)

## 2017-02-08 NOTE — Procedures (Signed)
Lumbar Epidural Steroid Injection - Interlaminar Approach with Fluoroscopic Guidance  Patient: [name]      Date of Birth: 1946/04/07 MRN: [id number] PCP: Gennette Pac, MD      Visit Date: 02/06/2017   Universal Protocol:    Date/Time: 03/21/185:22 AM  Consent Given By: the patient  Position: PRONE  Additional Comments: Vital signs were monitored before and after the procedure. Patient was prepped and draped in the usual sterile fashion. The correct patient, procedure, and site was verified.   Injection Procedure Details:  Procedure Site One Meds Administered:  Meds ordered this encounter  Medications  . lidocaine (PF) (XYLOCAINE) 1 % injection 0.3 mL  . methylPREDNISolone acetate (DEPO-MEDROL) injection 80 mg     Laterality: Left  Location/Site:  L4-L5  Needle size: 20 G  Needle type: Tuohy  Needle Placement: Paramedian epidural  Findings:  -Contrast Used: 1 mL iohexol 180 mg iodine/mL   -Comments: Excellent flow of contrast into the epidural space.  Procedure Details: Using a paramedian approach from the side mentioned above, the region overlying the inferior lamina was localized under fluoroscopic visualization and the soft tissues overlying this structure were infiltrated with 4 ml. of 1% Lidocaine without Epinephrine. The Tuohy needle was inserted into the epidural space using a paramedian approach.   The epidural space was localized using loss of resistance along with lateral and bi-planar fluoroscopic views.  After negative aspirate for air, blood, and CSF, a 2 ml. volume of Isovue-250 was injected into the epidural space and the flow of contrast was observed. Radiographs were obtained for documentation purposes.    The injectate was administered into the level noted above.   Additional Comments:  The patient tolerated the procedure well Dressing: Band-Aid    Post-procedure details: Patient was observed during the  procedure. Post-procedure instructions were reviewed.  Patient left the clinic in stable condition.

## 2017-02-09 DIAGNOSIS — H2513 Age-related nuclear cataract, bilateral: Secondary | ICD-10-CM | POA: Diagnosis not present

## 2017-02-09 DIAGNOSIS — H52202 Unspecified astigmatism, left eye: Secondary | ICD-10-CM | POA: Diagnosis not present

## 2017-02-15 ENCOUNTER — Encounter: Payer: Self-pay | Admitting: Internal Medicine

## 2017-02-21 ENCOUNTER — Encounter: Payer: Self-pay | Admitting: Internal Medicine

## 2017-02-22 MED ORDER — PANTOPRAZOLE SODIUM 40 MG PO TBEC: DELAYED_RELEASE_TABLET | ORAL | 4 refills | Status: DC

## 2017-03-06 ENCOUNTER — Telehealth: Payer: Self-pay | Admitting: *Deleted

## 2017-03-06 ENCOUNTER — Ambulatory Visit (AMBULATORY_SURGERY_CENTER): Payer: Self-pay | Admitting: *Deleted

## 2017-03-06 VITALS — Ht 65.5 in | Wt 134.8 lb

## 2017-03-06 DIAGNOSIS — R1010 Upper abdominal pain, unspecified: Secondary | ICD-10-CM

## 2017-03-06 DIAGNOSIS — Z86010 Personal history of colonic polyps: Secondary | ICD-10-CM

## 2017-03-06 NOTE — Progress Notes (Signed)
No allergies to eggs or soy. No problems with anesthesia.  Pt given Emmi instructions for colonoscopy  No oxygen use  No diet drug use  

## 2017-03-06 NOTE — Telephone Encounter (Signed)
Pt notified that EGD has been added to colonoscopy on 04/14/17

## 2017-03-06 NOTE — Telephone Encounter (Signed)
Sure - dx upper abdominal pain

## 2017-03-06 NOTE — Telephone Encounter (Signed)
Dr Carlean Purl: pt is scheduled for recall colonoscopy 04/14/17.  She had OV with Amy 09/15/16 for abdominal pain and bloating. Phone note 12/14/16 says pt to call back in March/April to schedule REV.  Pt here for PV today; she is wanting to have EGD added to colonoscopy instead of OV.  She says she has spoken to you about this.  Ok to add EGD to colonoscopy without OV?  Thanks, Juliann Pulse

## 2017-03-27 ENCOUNTER — Encounter: Payer: Medicare Other | Admitting: Internal Medicine

## 2017-03-30 DIAGNOSIS — F338 Other recurrent depressive disorders: Secondary | ICD-10-CM | POA: Diagnosis not present

## 2017-03-30 DIAGNOSIS — J309 Allergic rhinitis, unspecified: Secondary | ICD-10-CM | POA: Diagnosis not present

## 2017-03-30 DIAGNOSIS — E7800 Pure hypercholesterolemia, unspecified: Secondary | ICD-10-CM | POA: Diagnosis not present

## 2017-03-30 DIAGNOSIS — Z853 Personal history of malignant neoplasm of breast: Secondary | ICD-10-CM | POA: Diagnosis not present

## 2017-03-30 DIAGNOSIS — Z87442 Personal history of urinary calculi: Secondary | ICD-10-CM | POA: Diagnosis not present

## 2017-03-30 DIAGNOSIS — E039 Hypothyroidism, unspecified: Secondary | ICD-10-CM | POA: Diagnosis not present

## 2017-03-30 DIAGNOSIS — Z86010 Personal history of colonic polyps: Secondary | ICD-10-CM | POA: Diagnosis not present

## 2017-03-30 DIAGNOSIS — Z79899 Other long term (current) drug therapy: Secondary | ICD-10-CM | POA: Diagnosis not present

## 2017-03-30 DIAGNOSIS — Z23 Encounter for immunization: Secondary | ICD-10-CM | POA: Diagnosis not present

## 2017-04-04 ENCOUNTER — Other Ambulatory Visit: Payer: Self-pay | Admitting: Family Medicine

## 2017-04-04 DIAGNOSIS — Z853 Personal history of malignant neoplasm of breast: Secondary | ICD-10-CM

## 2017-04-06 DIAGNOSIS — Z124 Encounter for screening for malignant neoplasm of cervix: Secondary | ICD-10-CM | POA: Diagnosis not present

## 2017-04-06 DIAGNOSIS — Z01419 Encounter for gynecological examination (general) (routine) without abnormal findings: Secondary | ICD-10-CM | POA: Diagnosis not present

## 2017-04-07 DIAGNOSIS — M549 Dorsalgia, unspecified: Secondary | ICD-10-CM | POA: Diagnosis not present

## 2017-04-07 DIAGNOSIS — M6281 Muscle weakness (generalized): Secondary | ICD-10-CM | POA: Diagnosis not present

## 2017-04-07 DIAGNOSIS — M25552 Pain in left hip: Secondary | ICD-10-CM | POA: Diagnosis not present

## 2017-04-07 DIAGNOSIS — M25551 Pain in right hip: Secondary | ICD-10-CM | POA: Diagnosis not present

## 2017-04-11 ENCOUNTER — Other Ambulatory Visit: Payer: Self-pay | Admitting: Hematology and Oncology

## 2017-04-11 DIAGNOSIS — M549 Dorsalgia, unspecified: Secondary | ICD-10-CM | POA: Diagnosis not present

## 2017-04-11 DIAGNOSIS — M6281 Muscle weakness (generalized): Secondary | ICD-10-CM | POA: Diagnosis not present

## 2017-04-11 DIAGNOSIS — C50411 Malignant neoplasm of upper-outer quadrant of right female breast: Secondary | ICD-10-CM

## 2017-04-11 DIAGNOSIS — M25552 Pain in left hip: Secondary | ICD-10-CM | POA: Diagnosis not present

## 2017-04-11 DIAGNOSIS — M25551 Pain in right hip: Secondary | ICD-10-CM | POA: Diagnosis not present

## 2017-04-13 DIAGNOSIS — M6281 Muscle weakness (generalized): Secondary | ICD-10-CM | POA: Diagnosis not present

## 2017-04-13 DIAGNOSIS — M25551 Pain in right hip: Secondary | ICD-10-CM | POA: Diagnosis not present

## 2017-04-13 DIAGNOSIS — M549 Dorsalgia, unspecified: Secondary | ICD-10-CM | POA: Diagnosis not present

## 2017-04-13 DIAGNOSIS — M25552 Pain in left hip: Secondary | ICD-10-CM | POA: Diagnosis not present

## 2017-04-14 ENCOUNTER — Ambulatory Visit (AMBULATORY_SURGERY_CENTER): Payer: Medicare Other | Admitting: Internal Medicine

## 2017-04-14 ENCOUNTER — Encounter: Payer: Self-pay | Admitting: Internal Medicine

## 2017-04-14 VITALS — BP 117/72 | HR 54 | Resp 10 | Ht 65.0 in | Wt 136.0 lb

## 2017-04-14 DIAGNOSIS — K209 Esophagitis, unspecified without bleeding: Secondary | ICD-10-CM

## 2017-04-14 DIAGNOSIS — R1010 Upper abdominal pain, unspecified: Secondary | ICD-10-CM

## 2017-04-14 DIAGNOSIS — Z86010 Personal history of colonic polyps: Secondary | ICD-10-CM | POA: Diagnosis not present

## 2017-04-14 DIAGNOSIS — Z1211 Encounter for screening for malignant neoplasm of colon: Secondary | ICD-10-CM | POA: Diagnosis not present

## 2017-04-14 DIAGNOSIS — K635 Polyp of colon: Secondary | ICD-10-CM | POA: Diagnosis not present

## 2017-04-14 DIAGNOSIS — R109 Unspecified abdominal pain: Secondary | ICD-10-CM | POA: Diagnosis not present

## 2017-04-14 DIAGNOSIS — D123 Benign neoplasm of transverse colon: Secondary | ICD-10-CM

## 2017-04-14 MED ORDER — SODIUM CHLORIDE 0.9 % IV SOLN: 500.0000 mL | INTRAVENOUS | Status: DC

## 2017-04-14 NOTE — Progress Notes (Signed)
A and O x3. Report to RN. Tolerated MAC anesthesia well.Teeth unchanged after procedure.

## 2017-04-14 NOTE — Op Note (Signed)
[name] Procedure Date: 04/14/2017 9:06 AM MRN: [id number] Endoscopist: Gatha Mayer , MD Age: 71 Referring MD:  Date of Birth: 01/03/1946 Gender: Female Account #: [id number] Procedure:                Colonoscopy Indications:              Surveillance: Personal history of adenomatous                            polyps on last colonoscopy 5 years ago Medicines:                Propofol per Anesthesia, Monitored Anesthesia Care Procedure:                Pre-Anesthesia Assessment:                           - Prior to the procedure, a History and Physical                            was performed, and patient medications and                            allergies were reviewed. The patient's tolerance of                            previous anesthesia was also reviewed. The risks                            and benefits of the procedure and the sedation                            options and risks were discussed with the patient.                            All questions were answered, and informed consent                            was obtained. Prior Anticoagulants: The patient has                            taken no previous anticoagulant or antiplatelet                            agents. ASA Grade Assessment: II - A patient with                            mild systemic disease. After reviewing the risks                            and benefits, the patient was deemed in                            satisfactory condition to undergo the procedure.  After obtaining informed consent, the colonoscope                            was passed under direct vision. Throughout the                            procedure, the patient's blood pressure, pulse, and                            oxygen saturations were monitored continuously. The                            Colonoscope was introduced through the anus and   advanced to the the cecum, identified by                            appendiceal orifice and ileocecal valve. The                            colonoscopy was performed without difficulty. The                            patient tolerated the procedure well. The quality                            of the bowel preparation was good. The ileocecal                            valve, appendiceal orifice, and rectum were                            photographed. The bowel preparation used was                            Miralax. Scope In: 9:22:10 AM Scope Out: 9:35:47 AM Scope Withdrawal Time: 0 hours 9 minutes 5 seconds  Total Procedure Duration: 0 hours 13 minutes 37 seconds  Findings:                 The perianal and digital rectal examinations were                            normal.                           A 3 mm polyp was found in the transverse colon. The                            polyp was sessile. The polyp was removed with a                            cold snare. Resection and retrieval were complete.                            Verification of patient identification for the  specimen was done. Estimated blood loss was minimal.                           Multiple small and large-mouthed diverticula were                            found in the sigmoid colon and descending colon.                           The exam was otherwise without abnormality on                            direct and retroflexion views. Complications:            No immediate complications. Estimated Blood Loss:     Estimated blood loss was minimal. Impression:               - One 3 mm polyp in the transverse colon, removed                            with a cold snare. Resected and retrieved.                           - Diverticulosis in the sigmoid colon and in the                            descending colon.                           - The examination was otherwise normal on direct                             and retroflexion views. Recommendation:           - Patient has a contact number available for                            emergencies. The signs and symptoms of potential                            delayed complications were discussed with the                            patient. Return to normal activities tomorrow.                            Written discharge instructions were provided to the                            patient.                           - Resume previous diet.                           - Continue present medications.                           -  Repeat colonoscopy is recommended for                            surveillance. The colonoscopy date will be                            determined after pathology results from today's                            exam become available for review.                           - She says quality of life re: GI sxs tolerable now                            so unlikely to make any changes in Tx - probiotics                            made her feel worse and no help from Xifaxan for                            SIBO ? if ketotifen worth a try Gatha Mayer, MD 04/14/2017 9:48:52 AM This report has been signed electronically.

## 2017-04-14 NOTE — Progress Notes (Signed)
Pt's states no medical or surgical changes since previsit or office visit. 

## 2017-04-14 NOTE — Progress Notes (Signed)
1015Pt. Shaky after going to bathroom.  States her blood sugar is probably low. Checked 70.  Gave her juice and graham crackers.  BP150/72. Pulse 68. Color pink.  Skin warm and dry.    1025 Recheck BP 140/64.  Pulse 60.   Not shaky.   States she feels better after eating.

## 2017-04-14 NOTE — Patient Instructions (Addendum)
There was slight inflammation in the esophagus - maybe reflux changes. Biopsies taken.  One tiny colon polyp removed. I will let you know pathology results and when to have another routine colonoscopy by mail and/or My Chart.  I appreciate the opportunity to care for you. Gatha Mayer, MD, FACG   YOU HAD AN ENDOSCOPIC PROCEDURE TODAY AT [facility]:   Refer to the procedure report that was given to you for any specific questions about what was found during the examination.  If the procedure report does not answer your questions, please call your gastroenterologist to clarify.  If you requested that your care partner not be given the details of your procedure findings, then the procedure report has been included in a sealed envelope for you to review at your convenience later.  YOU SHOULD EXPECT: Some feelings of bloating in the abdomen. Passage of more gas than usual.  Walking can help get rid of the air that was put into your GI tract during the procedure and reduce the bloating. If you had a lower endoscopy (such as a colonoscopy or flexible sigmoidoscopy) you may notice spotting of blood in your stool or on the toilet paper. If you underwent a bowel prep for your procedure, you may not have a normal bowel movement for a few days.  Please Note:  You might notice some irritation and congestion in your nose or some drainage.  This is from the oxygen used during your procedure.  There is no need for concern and it should clear up in a day or so.  SYMPTOMS TO REPORT IMMEDIATELY:   Following lower endoscopy (colonoscopy or flexible sigmoidoscopy):  Excessive amounts of blood in the stool  Significant tenderness or worsening of abdominal pains  Swelling of the abdomen that is new, acute  Fever of 100F or higher   Following upper endoscopy (EGD)  Vomiting of blood or coffee ground material  New chest pain or pain under the shoulder blades  Painful or persistently  difficult swallowing  New shortness of breath  Fever of 100F or higher  Black, tarry-looking stools  For urgent or emergent issues, a gastroenterologist can be reached at any hour by calling [phone].  Please read all handouts given to you by your recovery nurse.   DIET:  We do recommend a small meal at first, but then you may proceed to your regular diet.  Drink plenty of fluids but you should avoid alcoholic beverages for 24 hours.  ACTIVITY:  You should plan to take it easy for the rest of today and you should NOT DRIVE or use heavy machinery until tomorrow (because of the sedation medicines used during the test).    FOLLOW UP: Our staff will call the number listed on your records the next business day following your procedure to check on you and address any questions or concerns that you may have regarding the information given to you following your procedure. If we do not reach you, we will leave a message.  However, if you are feeling well and you are not experiencing any problems, there is no need to return our call.  We will assume that you have returned to your regular daily activities without incident.  If any biopsies were taken you will be contacted by phone or by letter within the next 1-3 weeks.  Please call us at [phone] if you have not heard about the biopsies in 3 weeks.    SIGNATURES/CONFIDENTIALITY: You  and/or your care partner have signed paperwork which will be entered into your electronic medical record.  These signatures attest to the fact that that the information above on your After Visit Summary has been reviewed and is understood.  Full responsibility of the confidentiality of this discharge information lies with you and/or your care-partner.  Thank you for letting us take care of your healthcare needs today.

## 2017-04-14 NOTE — Op Note (Signed)
[facility] Patient Name: [name] Procedure Date: 04/14/2017 9:07 AM MRN: [id number] Endoscopist: Gatha Mayer , MD Age: 71 Referring MD:  Date of Birth: January 28, 1946 Gender: Female Account #: [id number] Procedure:                Upper GI endoscopy Indications:              Epigastric abdominal pain Medicines:                Propofol per Anesthesia, Monitored Anesthesia Care Procedure:                Pre-Anesthesia Assessment:                           - Prior to the procedure, a History and Physical                            was performed, and patient medications and                            allergies were reviewed. The patient's tolerance of                            previous anesthesia was also reviewed. The risks                            and benefits of the procedure and the sedation                            options and risks were discussed with the patient.                            All questions were answered, and informed consent                            was obtained. Prior Anticoagulants: The patient has                            taken no previous anticoagulant or antiplatelet                            agents. ASA Grade Assessment: II - A patient with                            mild systemic disease. After reviewing the risks                            and benefits, the patient was deemed in                            satisfactory condition to undergo the procedure.                           After obtaining informed consent, the endoscope was  passed under direct vision. Throughout the                            procedure, the patient's blood pressure, pulse, and                            oxygen saturations were monitored continuously. The                            Endoscope was introduced through the mouth, and                            advanced to the second part of duodenum. The upper                            GI  endoscopy was accomplished without difficulty.                            The patient tolerated the procedure well. Scope In: Scope Out: Findings:                 Esophagitis with no bleeding was found in the                            distal esophagus. Biopsies were taken with a cold                            forceps for histology. Verification of patient                            identification for the specimen was done. Estimated                            blood loss was minimal.                           The exam was otherwise without abnormality.                           The cardia and gastric fundus were normal on                            retroflexion. Complications:            No immediate complications. Estimated Blood Loss:     Estimated blood loss was minimal. Impression:               - Reflux esophagitis. Biopsied.                           - The examination was otherwise normal. Recommendation:           - Patient has a contact number available for                            emergencies. The signs and symptoms of potential  delayed complications were discussed with the                            patient. Return to normal activities tomorrow.                            Written discharge instructions were provided to the                            patient.                           - Resume previous diet.                           - Continue present medications.                           - Await pathology results. Gatha Mayer, MD 04/14/2017 9:44:21 AM This report has been signed electronically.

## 2017-04-18 ENCOUNTER — Encounter: Payer: Self-pay | Admitting: Internal Medicine

## 2017-04-18 ENCOUNTER — Telehealth: Payer: Self-pay

## 2017-04-18 DIAGNOSIS — M25551 Pain in right hip: Secondary | ICD-10-CM | POA: Diagnosis not present

## 2017-04-18 DIAGNOSIS — M6281 Muscle weakness (generalized): Secondary | ICD-10-CM | POA: Diagnosis not present

## 2017-04-18 DIAGNOSIS — M549 Dorsalgia, unspecified: Secondary | ICD-10-CM | POA: Diagnosis not present

## 2017-04-18 DIAGNOSIS — M25552 Pain in left hip: Secondary | ICD-10-CM | POA: Diagnosis not present

## 2017-04-18 NOTE — Telephone Encounter (Signed)
  Follow up Call-  Call back number 04/14/2017  Post procedure Call Back phone  # [phone]  Permission to leave phone message Yes  Some recent data might be hidden     Patient questions:  Do you have a fever, pain , or abdominal swelling? No. Pain Score  0 *  Have you tolerated food without any problems? Yes.    Have you been able to return to your normal activities? Yes.    Do you have any questions about your discharge instructions: Diet   No. Medications  No. Follow up visit  No.  Do you have questions or concerns about your Care? No.  Actions: * If pain score is 4 or above: No action needed, pain <4.  No problems noted per pt. maw

## 2017-04-20 DIAGNOSIS — M25552 Pain in left hip: Secondary | ICD-10-CM | POA: Diagnosis not present

## 2017-04-20 DIAGNOSIS — M25551 Pain in right hip: Secondary | ICD-10-CM | POA: Diagnosis not present

## 2017-04-20 DIAGNOSIS — M6281 Muscle weakness (generalized): Secondary | ICD-10-CM | POA: Diagnosis not present

## 2017-04-20 DIAGNOSIS — M549 Dorsalgia, unspecified: Secondary | ICD-10-CM | POA: Diagnosis not present

## 2017-04-24 ENCOUNTER — Encounter: Payer: Self-pay | Admitting: Internal Medicine

## 2017-04-24 DIAGNOSIS — M25552 Pain in left hip: Secondary | ICD-10-CM | POA: Diagnosis not present

## 2017-04-24 DIAGNOSIS — M549 Dorsalgia, unspecified: Secondary | ICD-10-CM | POA: Diagnosis not present

## 2017-04-24 DIAGNOSIS — M25551 Pain in right hip: Secondary | ICD-10-CM | POA: Diagnosis not present

## 2017-04-24 DIAGNOSIS — M6281 Muscle weakness (generalized): Secondary | ICD-10-CM | POA: Diagnosis not present

## 2017-04-24 NOTE — Progress Notes (Signed)
Esophagus biopsies did not show any problem Colon polyp was not pre-cancerous  Colon recall 03/2022 My Chart letter

## 2017-04-26 DIAGNOSIS — M549 Dorsalgia, unspecified: Secondary | ICD-10-CM | POA: Diagnosis not present

## 2017-04-26 DIAGNOSIS — M6281 Muscle weakness (generalized): Secondary | ICD-10-CM | POA: Diagnosis not present

## 2017-04-26 DIAGNOSIS — M25551 Pain in right hip: Secondary | ICD-10-CM | POA: Diagnosis not present

## 2017-04-26 DIAGNOSIS — M25552 Pain in left hip: Secondary | ICD-10-CM | POA: Diagnosis not present

## 2017-05-09 DIAGNOSIS — M549 Dorsalgia, unspecified: Secondary | ICD-10-CM | POA: Diagnosis not present

## 2017-05-09 DIAGNOSIS — M6281 Muscle weakness (generalized): Secondary | ICD-10-CM | POA: Diagnosis not present

## 2017-05-09 DIAGNOSIS — M25552 Pain in left hip: Secondary | ICD-10-CM | POA: Diagnosis not present

## 2017-05-09 DIAGNOSIS — M25551 Pain in right hip: Secondary | ICD-10-CM | POA: Diagnosis not present

## 2017-05-11 DIAGNOSIS — M549 Dorsalgia, unspecified: Secondary | ICD-10-CM | POA: Diagnosis not present

## 2017-05-11 DIAGNOSIS — M25551 Pain in right hip: Secondary | ICD-10-CM | POA: Diagnosis not present

## 2017-05-11 DIAGNOSIS — M25552 Pain in left hip: Secondary | ICD-10-CM | POA: Diagnosis not present

## 2017-05-11 DIAGNOSIS — M6281 Muscle weakness (generalized): Secondary | ICD-10-CM | POA: Diagnosis not present

## 2017-05-22 DIAGNOSIS — M25552 Pain in left hip: Secondary | ICD-10-CM | POA: Diagnosis not present

## 2017-05-22 DIAGNOSIS — M6281 Muscle weakness (generalized): Secondary | ICD-10-CM | POA: Diagnosis not present

## 2017-05-22 DIAGNOSIS — M25551 Pain in right hip: Secondary | ICD-10-CM | POA: Diagnosis not present

## 2017-05-22 DIAGNOSIS — M549 Dorsalgia, unspecified: Secondary | ICD-10-CM | POA: Diagnosis not present

## 2017-05-25 DIAGNOSIS — M6281 Muscle weakness (generalized): Secondary | ICD-10-CM | POA: Diagnosis not present

## 2017-05-25 DIAGNOSIS — M25552 Pain in left hip: Secondary | ICD-10-CM | POA: Diagnosis not present

## 2017-05-25 DIAGNOSIS — M549 Dorsalgia, unspecified: Secondary | ICD-10-CM | POA: Diagnosis not present

## 2017-05-25 DIAGNOSIS — M25551 Pain in right hip: Secondary | ICD-10-CM | POA: Diagnosis not present

## 2017-05-30 DIAGNOSIS — M25552 Pain in left hip: Secondary | ICD-10-CM | POA: Diagnosis not present

## 2017-05-30 DIAGNOSIS — M6281 Muscle weakness (generalized): Secondary | ICD-10-CM | POA: Diagnosis not present

## 2017-05-30 DIAGNOSIS — M25551 Pain in right hip: Secondary | ICD-10-CM | POA: Diagnosis not present

## 2017-05-30 DIAGNOSIS — M549 Dorsalgia, unspecified: Secondary | ICD-10-CM | POA: Diagnosis not present

## 2017-06-01 DIAGNOSIS — M6281 Muscle weakness (generalized): Secondary | ICD-10-CM | POA: Diagnosis not present

## 2017-06-01 DIAGNOSIS — M25552 Pain in left hip: Secondary | ICD-10-CM | POA: Diagnosis not present

## 2017-06-01 DIAGNOSIS — M549 Dorsalgia, unspecified: Secondary | ICD-10-CM | POA: Diagnosis not present

## 2017-06-01 DIAGNOSIS — M25551 Pain in right hip: Secondary | ICD-10-CM | POA: Diagnosis not present

## 2017-06-06 DIAGNOSIS — M25552 Pain in left hip: Secondary | ICD-10-CM | POA: Diagnosis not present

## 2017-06-06 DIAGNOSIS — M25551 Pain in right hip: Secondary | ICD-10-CM | POA: Diagnosis not present

## 2017-06-06 DIAGNOSIS — M549 Dorsalgia, unspecified: Secondary | ICD-10-CM | POA: Diagnosis not present

## 2017-06-06 DIAGNOSIS — M6281 Muscle weakness (generalized): Secondary | ICD-10-CM | POA: Diagnosis not present

## 2017-06-09 DIAGNOSIS — M549 Dorsalgia, unspecified: Secondary | ICD-10-CM | POA: Diagnosis not present

## 2017-06-09 DIAGNOSIS — M25552 Pain in left hip: Secondary | ICD-10-CM | POA: Diagnosis not present

## 2017-06-09 DIAGNOSIS — M6281 Muscle weakness (generalized): Secondary | ICD-10-CM | POA: Diagnosis not present

## 2017-06-09 DIAGNOSIS — M25551 Pain in right hip: Secondary | ICD-10-CM | POA: Diagnosis not present

## 2017-06-11 DIAGNOSIS — N200 Calculus of kidney: Secondary | ICD-10-CM | POA: Diagnosis not present

## 2017-06-12 DIAGNOSIS — M6281 Muscle weakness (generalized): Secondary | ICD-10-CM | POA: Diagnosis not present

## 2017-06-12 DIAGNOSIS — M549 Dorsalgia, unspecified: Secondary | ICD-10-CM | POA: Diagnosis not present

## 2017-06-12 DIAGNOSIS — M25552 Pain in left hip: Secondary | ICD-10-CM | POA: Diagnosis not present

## 2017-06-12 DIAGNOSIS — M25551 Pain in right hip: Secondary | ICD-10-CM | POA: Diagnosis not present

## 2017-06-15 DIAGNOSIS — M25551 Pain in right hip: Secondary | ICD-10-CM | POA: Diagnosis not present

## 2017-06-15 DIAGNOSIS — M6281 Muscle weakness (generalized): Secondary | ICD-10-CM | POA: Diagnosis not present

## 2017-06-15 DIAGNOSIS — M25552 Pain in left hip: Secondary | ICD-10-CM | POA: Diagnosis not present

## 2017-06-15 DIAGNOSIS — M549 Dorsalgia, unspecified: Secondary | ICD-10-CM | POA: Diagnosis not present

## 2017-06-19 DIAGNOSIS — N200 Calculus of kidney: Secondary | ICD-10-CM | POA: Diagnosis not present

## 2017-06-19 DIAGNOSIS — N1330 Unspecified hydronephrosis: Secondary | ICD-10-CM | POA: Diagnosis not present

## 2017-06-20 DIAGNOSIS — M25551 Pain in right hip: Secondary | ICD-10-CM | POA: Diagnosis not present

## 2017-06-20 DIAGNOSIS — M6281 Muscle weakness (generalized): Secondary | ICD-10-CM | POA: Diagnosis not present

## 2017-06-20 DIAGNOSIS — M549 Dorsalgia, unspecified: Secondary | ICD-10-CM | POA: Diagnosis not present

## 2017-06-20 DIAGNOSIS — M25552 Pain in left hip: Secondary | ICD-10-CM | POA: Diagnosis not present

## 2017-06-26 ENCOUNTER — Telehealth (INDEPENDENT_AMBULATORY_CARE_PROVIDER_SITE_OTHER): Payer: Self-pay | Admitting: Radiology

## 2017-06-26 NOTE — Telephone Encounter (Signed)
Patient is calling for 2 things: 1--She would like to get an injection in her back 2--She states that she given and rx for Gabapentin but it has not helped and she would like to get an rx for Cymbalta and see if it will help her.  Please advise

## 2017-06-26 NOTE — Telephone Encounter (Signed)
If left side pain then Left L4 and L5 TF or if bilateral then bilateral TF esi, would rather talk to her before strting medication that may not mix well with Wellbutrin she already takes. Would do injection and we can talk about medication at that time.

## 2017-06-26 NOTE — Telephone Encounter (Signed)
I called and sched appt on 07/10/17 @ 915 and advised that we would discuss the Cymbalta at that time.

## 2017-07-06 ENCOUNTER — Other Ambulatory Visit: Payer: Self-pay

## 2017-07-06 MED ORDER — LISINOPRIL 10 MG PO TABS: 10.0000 mg | ORAL_TABLET | Freq: Every day | ORAL | 0 refills | Status: DC

## 2017-07-10 ENCOUNTER — Ambulatory Visit (INDEPENDENT_AMBULATORY_CARE_PROVIDER_SITE_OTHER): Payer: Medicare Other | Admitting: Physical Medicine and Rehabilitation

## 2017-07-10 ENCOUNTER — Ambulatory Visit (INDEPENDENT_AMBULATORY_CARE_PROVIDER_SITE_OTHER): Payer: Medicare Other

## 2017-07-10 VITALS — BP 118/76 | HR 65 | Temp 97.9°F

## 2017-07-10 DIAGNOSIS — M419 Scoliosis, unspecified: Secondary | ICD-10-CM

## 2017-07-10 DIAGNOSIS — M5416 Radiculopathy, lumbar region: Secondary | ICD-10-CM

## 2017-07-10 DIAGNOSIS — R202 Paresthesia of skin: Secondary | ICD-10-CM

## 2017-07-10 DIAGNOSIS — M48062 Spinal stenosis, lumbar region with neurogenic claudication: Secondary | ICD-10-CM | POA: Diagnosis not present

## 2017-07-10 MED ORDER — BETAMETHASONE SOD PHOS & ACET 6 (3-3) MG/ML IJ SUSP
12.0000 mg | Freq: Once | INTRAMUSCULAR | Status: AC
  Administered 2017-07-10: 12 mg

## 2017-07-10 MED ORDER — LIDOCAINE HCL (PF) 1 % IJ SOLN
2.0000 mL | Freq: Once | INTRAMUSCULAR | Status: AC
  Administered 2017-07-10: 2 mL

## 2017-07-10 NOTE — Patient Instructions (Signed)

## 2017-07-10 NOTE — Progress Notes (Deleted)
Patient states that she is having pain in the right leg, buttock, hip, thigh and some neuropathy into the lower leg.  The last injection helped, shifted the pain to the left side, got rx for prednisone for it.  She states that the last 2 months she has a lot of pain.

## 2017-07-11 ENCOUNTER — Encounter (INDEPENDENT_AMBULATORY_CARE_PROVIDER_SITE_OTHER): Payer: Self-pay | Admitting: Physical Medicine and Rehabilitation

## 2017-07-11 NOTE — Procedures (Signed)
Lumbosacral Transforaminal Epidural Steroid Injection - Infraneural Approach with Fluoroscopic Guidance  Patient: [name]      Date of Birth: 07/18/1946 MRN: [id number] PCP: Hulan Fess, MD      Visit Date: 07/10/2017   Universal Protocol:     Consent Given By: the patient  Position: PRONE   Additional Comments: Vital signs were monitored before and after the procedure. Patient was prepped and draped in the usual sterile fashion. The correct patient, procedure, and site was verified.   Injection Procedure Details:  Procedure Site One Meds Administered:  Meds ordered this encounter  Medications  . lidocaine (PF) (XYLOCAINE) 1 % injection 2 mL  . betamethasone acetate-betamethasone sodium phosphate (CELESTONE) injection 12 mg      Laterality: Right  Location/Site:  L4-L5  Needle size: 22 G  Needle type: Spinal  Needle Placement: Transforaminal  Findings:  -Contrast Used: 1 mL iohexol 180 mg iodine/mL   -Comments: Excellent flow of contrast along the nerve and into the epidural space.  Procedure Details: After squaring off the end-plates of the desired vertebral level to get a true AP view, the C-arm was obliqued to the painful side so that the superior articulating process is positioned about 1/3 the length of the inferior endplate.  The needle was aimed toward the junction of the superior articular process and the transverse process of the inferior vertebrae. The needle's initial entry is in the lower third of the foramen through Kambin's triangle. The soft tissues overlying this target were infiltrated with 2-3 ml. of 1% Lidocaine without Epinephrine.  The spinal needle was then inserted and advanced toward the target using a "trajectory" view along the fluoroscope beam.  Under AP and lateral visualization, the needle was advanced so it did not puncture dura and did not traverse medially beyond the 6 o'clock position of the pedicle. Bi-planar projections were  used to confirm position. Aspiration was confirmed to be negative for CSF and/or blood. A 1-2 ml. volume of Isovue-250 was injected and flow of contrast was noted at each level. Radiographs were obtained for documentation purposes.   After attaining the desired flow of contrast documented above, a 0.5 to 1.0 ml test dose of 0.25% Marcaine was injected into each respective transforaminal space.  The patient was observed for 90 seconds post injection.  After no sensory deficits were reported, and normal lower extremity motor function was noted,   the above injectate was administered so that equal amounts of the injectate were placed at each foramen (level) into the transforaminal epidural space.   Additional Comments:  The patient tolerated the procedure well Dressing: Band-Aid    Post-procedure details: Patient was observed during the procedure. Post-procedure instructions were reviewed.  Patient left the clinic in stable condition.

## 2017-07-11 NOTE — Progress Notes (Signed)
[name] - 71 y.o. female MRN [id number]  Date of birth: 31-May-1946  Office Visit Note: Visit Date: 07/10/2017 PCP: Hulan Fess, MD Referred by: Hulan Fess, MD  Subjective: Chief Complaint  Patient presents with  . Lower Back - Injections  . Right Hip - Pain  . Right Lower Leg - Pain, Numbness  . Left Foot - Numbness   HPI: [name] is a pleasant and active 71 year old female with history of lumbar multifactorial stenosis at L4-5 with right foraminal stenosis at L4-5 due to collapsing facet arthropathy and scoliosis. She also suffers from severe osteoarthritis of the knees bilaterally. She's been followed by Dr. Durward Fortes for quite some time. She has most of her pain with standing and walking. His pain radiating down the right leg in an anterior lateral distribution. She also gets some tingling paresthesias in the legs bilaterally. She refers to this as "neuropathy". She's been dealing with her pain quite a bit and is really done a lot of self medication itself treatment over the years. She is becoming increasingly frustrated with the amount of pain she is having. It appears talking with her on a few occasions that she is seeking out a cure for her condition but really has not considered surgical decompression yet. She is more recently tried dry needling and many sessions of physical therapy without relief of her hip pain. She has tried Hemp oil without relief. She has tried acupuncture. We have tried gabapentin that she really has not seen beneficial has stopped that. She has not had any focal weakness. She reports that the last injection we performed which was in March helped significantly but shifted the pain really more to the left side for a while and she noticed it more on the left. She had a trip where she was hurting quite a bit on the left side and someone had some prednisone that she took and that seems to have helped. She reports a lot of pain for the last 2 months. This is  significant since we did the injection in March. Her MRI is dated 2016 and reviewed again below. Her case is complicated by some amount of underlying depression/anxiety. She is on Wellbutrin. She is asking today about the use of Cymbalta for pain. Again her husband is a Teacher, music.    Review of Systems  Constitutional: Negative for chills, fever, malaise/fatigue and weight loss.  HENT: Negative for hearing loss and sinus pain.   Eyes: Negative for blurred vision, double vision and photophobia.  Respiratory: Negative for cough and shortness of breath.   Cardiovascular: Negative for chest pain, palpitations and leg swelling.  Gastrointestinal: Negative for abdominal pain, nausea and vomiting.  Genitourinary: Negative for flank pain.  Musculoskeletal: Positive for back pain and joint pain. Negative for myalgias.       Right hip and leg pain  Skin: Negative for itching and rash.  Neurological: Positive for tingling. Negative for tremors, focal weakness and weakness.  Endo/Heme/Allergies: Negative.   Psychiatric/Behavioral: Negative for depression.  All other systems reviewed and are negative.  Otherwise per HPI.  Assessment & Plan: Visit Diagnoses:  1. Lumbar radiculopathy   2. Spinal stenosis of lumbar region with neurogenic claudication   3. Scoliosis of lumbar spine, unspecified scoliosis type   4. Paresthesia of skin     Plan: Findings:  Chronic worsening low back and mostly right hip and leg pain with occasional left low back pain and some numbness tingling-type paresthesias in both limbs of  the lower limbs. She has some level of anxiety depression and frustration over the length of time and amount she has been in pain. She really does have chronic pain syndrome at this point. She has tried a lot of different techniques and medications without relief. We discussed this at length today. I think the next step is repeating the right L4 transforaminal epidural injection. Thing is her a lot  of relief and I think she should consider surgical evaluation for the foraminal stenosis and central stenosis at this level. I think this would help a lot of the hip and leg pain. Her back pain is probably related to facet arthropathy and this is not something we've looked at recently with her because the hip and leg pain is usually the most significant problem. She also has known osteoarthritis of the knees bilaterally which doesn't help the situation. She tells me that she has been told that she needs knee replacements. She has been reluctant for any type of surgery. I did discuss with her Cymbalta and I do think he could try Cymbalta even on Wellbutrin with monitoring of any type of serotonin syndrome. We discussed this at length. I don't think it would cure her problem I don't think the pain relief from the Cymbalta would be to the level that she wants. Unfortunately I think she is in line for frustrating amount of symptomatic relief with periods of significant flareup. She's doing everything right otherwise. I did complete the injection diagnostically today on the right L4 foramen.    Meds & Orders:  Meds ordered this encounter  Medications  . lidocaine (PF) (XYLOCAINE) 1 % injection 2 mL  . betamethasone acetate-betamethasone sodium phosphate (CELESTONE) injection 12 mg    Orders Placed This Encounter  Procedures  . XR C-ARM NO REPORT  . Epidural Steroid injection    Follow-up: Return if symptoms worsen or fail to improve.   Procedures: No procedures performed  Lumbosacral Transforaminal Epidural Steroid Injection - Infraneural Approach with Fluoroscopic Guidance  Patient: [name]      Date of Birth: [birth year] MRN: [id number] PCP: Hulan Fess, MD      Visit Date: 07/10/2017   Universal Protocol:     Consent Given By: the patient  Position: PRONE   Additional Comments: Vital signs were monitored before and after the procedure. Patient was prepped and draped in the  usual sterile fashion. The correct patient, procedure, and site was verified.   Injection Procedure Details:  Procedure Site One Meds Administered:  Meds ordered this encounter  Medications  . lidocaine (PF) (XYLOCAINE) 1 % injection 2 mL  . betamethasone acetate-betamethasone sodium phosphate (CELESTONE) injection 12 mg      Laterality: Right  Location/Site:  L4-L5  Needle size: 22 G  Needle type: Spinal  Needle Placement: Transforaminal  Findings:  -Contrast Used: 1 mL iohexol 180 mg iodine/mL   -Comments: Excellent flow of contrast along the nerve and into the epidural space.  Procedure Details: After squaring off the end-plates of the desired vertebral level to get a true AP view, the C-arm was obliqued to the painful side so that the superior articulating process is positioned about 1/3 the length of the inferior endplate.  The needle was aimed toward the junction of the superior articular process and the transverse process of the inferior vertebrae. The needle's initial entry is in the lower third of the foramen through Kambin's triangle. The soft tissues overlying this target were infiltrated with 2-3 ml. of  1% Lidocaine without Epinephrine.  The spinal needle was then inserted and advanced toward the target using a "trajectory" view along the fluoroscope beam.  Under AP and lateral visualization, the needle was advanced so it did not puncture dura and did not traverse medially beyond the 6 o'clock position of the pedicle. Bi-planar projections were used to confirm position. Aspiration was confirmed to be negative for CSF and/or blood. A 1-2 ml. volume of Isovue-250 was injected and flow of contrast was noted at each level. Radiographs were obtained for documentation purposes.   After attaining the desired flow of contrast documented above, a 0.5 to 1.0 ml test dose of 0.25% Marcaine was injected into each respective transforaminal space.  The patient was observed for 90  seconds post injection.  After no sensory deficits were reported, and normal lower extremity motor function was noted,   the above injectate was administered so that equal amounts of the injectate were placed at each foramen (level) into the transforaminal epidural space.   Additional Comments:  The patient tolerated the procedure well Dressing: Band-Aid    Post-procedure details: Patient was observed during the procedure. Post-procedure instructions were reviewed.  Patient left the clinic in stable condition.   Clinical History: Lumbar spine MRI 07/03/2015   L4-5: Prominent circumferential disc bulging and moderate facet and ligamentum flavum hypertrophy result in moderate spinal stenosis, moderate bilateral lateral recess stenosis, and severe right and moderate left neural foraminal stenosis. The L5 nerve roots could be affected in the lateral recesses, and the right L4 nerve root may be affected in the neural foramen.  L5-S1: Mild disc bulging and moderate facet arthrosis result in minimal bilateral neural foraminal stenosis without spinal stenosis.  IMPRESSION: Multilevel lumbar disc and facet degeneration, most notable at L4-5 where there is moderate spinal stenosis, bilateral lateral recess stenosis, and severe right neural foraminal stenosis.  She reports that she has never smoked. She has never used smokeless tobacco. No results for input(s): HGBA1C, LABURIC in the last 8760 hours.  Objective:  VS:  HT:    WT:   BMI:     BP:118/76  HR:65bpm  TEMP:97.9 F (36.6 C)(Oral)  RESP:100 % Physical Exam  Constitutional: She is oriented to person, place, and time. She appears well-developed and well-nourished.  Eyes: Pupils are equal, round, and reactive to light. Conjunctivae and EOM are normal.  Cardiovascular: Normal rate and intact distal pulses.   Pulmonary/Chest: Effort normal.  Musculoskeletal:  Patient is slow to rise from a seated position. She has pain with  extension rotation of the lumbar spine but good distal strength. She ambulates without aid.  Neurological: She is alert and oriented to person, place, and time. She exhibits normal muscle tone.  Skin: Skin is warm and dry. No rash noted. No erythema.  Psychiatric: She has a normal mood and affect. Her behavior is normal.  Nursing note and vitals reviewed.   Ortho Exam Imaging: Xr C-arm No Report  Result Date: 07/10/2017 Please see Notes or Procedures tab for imaging impression.   Past Medical/Family/Surgical/Social History: Medications & Allergies reviewed per EMR Patient Active Problem List   Diagnosis Date Noted  . Essential hypertension 08/26/2016  . Mitral valve disorder 08/26/2016  . History of palpitations 08/26/2016  . Dyslipidemia 08/26/2016  . Internal hemorrhoids 08/03/2015  . Rectal bleeding 08/03/2015  . Hx of radiation therapy   . Breast cancer of upper-outer quadrant of right female breast ([place]) 05/23/2013  . Personal history of colonic polyps 01/06/2012  .  Irritable bowel syndrome - constipation predominant 01/06/2012   Past Medical History:  Diagnosis Date  . Adenomatous colon polyp 03/07/2012  . Allergic rhinitis   . Arthritis   . Breast cancer ([place]) 05/20/13   right  . Diverticulosis   . Graves' disease    2011  . HLD (hyperlipidemia)   . Hx of radiation therapy 08/13/13- 09/06/13   right breast 4250 cGy 17 sessions  . Hypertension   . IBS (irritable bowel syndrome)   . Osteopenia    Family History  Problem Relation Age of Onset  . Heart disease Mother 13       MI  . Lung cancer Mother   . Hypertension Mother   . Alcohol abuse Father   . COPD Father   . Colon cancer Maternal Grandfather 67  . Diabetes Paternal Grandmother   . Breast cancer Maternal Aunt   . Pancreatic cancer Maternal Aunt   . Stomach cancer Neg Hx    Past Surgical History:  Procedure Laterality Date  . BREAST BIOPSY Right   . BREAST LUMPECTOMY     06/11/13  with lymph node  biopsy  . BREAST LUMPECTOMY WITH NEEDLE LOCALIZATION AND AXILLARY SENTINEL LYMPH NODE BX Right 06/11/2013   Procedure: BREAST LUMPECTOMY WITH NEEDLE LOCALIZATION AND AXILLARY SENTINEL LYMPH NODE BX;  Surgeon: Harl Bowie, MD;  Location: [facility];  Service: General;  Laterality: Right;  Needle localization BCG 7:30   . COLONOSCOPY W/ BIOPSIES AND POLYPECTOMY  multiple  . KNEE ARTHROSCOPY Left    Social History   Occupational History  . RN Unemployed   Social History Main Topics  . Smoking status: Never Smoker  . Smokeless tobacco: Never Used  . Alcohol use 0.0 oz/week     Comment: occasional  . Drug use: No  . Sexual activity: Yes     Comment: menarche 59, G 1 age 36, menopause 66, HRT x 9 yrs

## 2017-07-12 ENCOUNTER — Other Ambulatory Visit: Payer: Self-pay | Admitting: Hematology and Oncology

## 2017-07-12 DIAGNOSIS — C50411 Malignant neoplasm of upper-outer quadrant of right female breast: Secondary | ICD-10-CM

## 2017-07-14 ENCOUNTER — Other Ambulatory Visit: Payer: Self-pay | Admitting: Family Medicine

## 2017-07-14 DIAGNOSIS — M8580 Other specified disorders of bone density and structure, unspecified site: Secondary | ICD-10-CM

## 2017-08-04 ENCOUNTER — Other Ambulatory Visit: Payer: Medicare Other

## 2017-08-20 ENCOUNTER — Other Ambulatory Visit: Payer: Self-pay | Admitting: Cardiology

## 2017-08-21 NOTE — Telephone Encounter (Signed)
REFILL 

## 2017-08-28 ENCOUNTER — Ambulatory Visit
Admission: RE | Admit: 2017-08-28 | Discharge: 2017-08-28 | Disposition: A | Discharge status: Home / Self Care | Payer: Medicare Other | Source: Ambulatory Visit | Attending: Family Medicine | Admitting: Family Medicine

## 2017-08-28 DIAGNOSIS — M85852 Other specified disorders of bone density and structure, left thigh: Secondary | ICD-10-CM | POA: Diagnosis not present

## 2017-08-28 DIAGNOSIS — Z853 Personal history of malignant neoplasm of breast: Secondary | ICD-10-CM

## 2017-08-28 DIAGNOSIS — Z780 Asymptomatic menopausal state: Secondary | ICD-10-CM | POA: Diagnosis not present

## 2017-08-28 DIAGNOSIS — M8580 Other specified disorders of bone density and structure, unspecified site: Secondary | ICD-10-CM

## 2017-08-28 DIAGNOSIS — R922 Inconclusive mammogram: Secondary | ICD-10-CM | POA: Diagnosis not present

## 2017-08-28 HISTORY — DX: Personal history of irradiation: Z92.3

## 2017-08-31 ENCOUNTER — Ambulatory Visit (HOSPITAL_BASED_OUTPATIENT_CLINIC_OR_DEPARTMENT_OTHER): Payer: Medicare Other | Admitting: Hematology and Oncology

## 2017-08-31 ENCOUNTER — Telehealth: Payer: Self-pay | Admitting: Hematology and Oncology

## 2017-08-31 DIAGNOSIS — Z170 Estrogen receptor positive status [ER+]: Secondary | ICD-10-CM

## 2017-08-31 DIAGNOSIS — C50411 Malignant neoplasm of upper-outer quadrant of right female breast: Secondary | ICD-10-CM

## 2017-08-31 DIAGNOSIS — Z1231 Encounter for screening mammogram for malignant neoplasm of breast: Secondary | ICD-10-CM

## 2017-08-31 DIAGNOSIS — Z79811 Long term (current) use of aromatase inhibitors: Secondary | ICD-10-CM | POA: Diagnosis not present

## 2017-08-31 MED ORDER — TAMOXIFEN CITRATE 20 MG PO TABS: 20.0000 mg | ORAL_TABLET | Freq: Every day | ORAL | 3 refills | Status: DC

## 2017-08-31 NOTE — Telephone Encounter (Signed)
Gave patient avs report and appointments for October 2019. Central radiology will call re mri.

## 2017-08-31 NOTE — Assessment & Plan Note (Signed)
Right breast invasive lobular cancer with LCIS T1 N0 M0 stage IA status post lumpectomy which did not show any further evidence of invasive breast cancer. ER positive PR positive HER-2 negative, received adjuvant radiation completed 09/04/2013, started her tamoxifen 20 mg daily from November 2013  Tamoxifen toxicities: Patient denies any hot flashes. She does have myalgias but it may not be related to tamoxifen. She will be on tamoxifen for 10 years  Breast cancer surveillance: 1. Breast exam 08/31/17  does not reveal any palpable lumps or nodules of concern. 2. Mammogram 08/28/2017  is normal, breast density category B 3. Breast MRI 01/25/2016: Negative, plan to do MRIs every other year  We previously discussed interaction between Wellbutrin and tamoxifen.  Return to clinic in 1 year for follow-up

## 2017-08-31 NOTE — Progress Notes (Signed)
Patient Care Team: Hulan Fess, MD as PCP - General (Family Medicine)  DIAGNOSIS:  Encounter Diagnoses  Name Primary?  . Malignant neoplasm of upper-outer quadrant of right breast in female, estrogen receptor positive ([place])   . Encounter for screening mammogram for malignant neoplasm of breast     SUMMARY OF ONCOLOGIC HISTORY:   Breast cancer of upper-outer quadrant of right female breast ([place])   04/23/2013 Initial Diagnosis    Right breast calcifications stereotactic biopsy revealed invasive lobular cancer with LCIS, lymph node benign, ER positive PR negative Ki-67 10%      06/11/2013 Surgery    Right breast lumpectomy: Lobular carcinoma in situ, no invasive disease found, sentinel nodes negative      08/06/2013 - 09/04/2013 Radiation Therapy    Adjuvant radiation therapy      10/03/2013 -  Anti-estrogen oral therapy    Tamoxifen 20 mg daily       CHIEF COMPLIANT: Follow-up on tamoxifen therapy  INTERVAL HISTORY: [name] is a 71 year old above-mentioned history of right breast cancer treated with lumpectomy and radiation and is currently on tamoxifen therapy. She is tolerating tamoxifen extremely well. She does not have any hot flashes or myalgias. Denies any lumps or nodules in the breast. She had abdominal bloating which was thoroughly investigated with CT scans and ultrasounds of the belly. She had uterine thickening and underwent endometrial biopsy which was benign.  REVIEW OF SYSTEMS:   Constitutional: Denies fevers, chills or abnormal weight loss Eyes: Denies blurriness of vision Ears, nose, mouth, throat, and face: Denies mucositis or sore throat Respiratory: Denies cough, dyspnea or wheezes Cardiovascular: Denies palpitation, chest discomfort Gastrointestinal: Abdominal bloating Skin: Denies abnormal skin rashes Lymphatics: Denies new lymphadenopathy or easy bruising Neurological:Denies numbness, tingling or new weaknesses Behavioral/Psych: Mood is  stable, no new changes  Extremities: No lower extremity edema Breast:  denies any pain or lumps or nodules in either breasts All other systems were reviewed with the patient and are negative.  I have reviewed the past medical history, past surgical history, social history and family history with the patient and they are unchanged from previous note.  ALLERGIES:  is allergic to amoxicillin; terak [terramycin]; and oxytetracycline.  MEDICATIONS:  Current Outpatient Prescriptions  Medication Sig Dispense Refill  . atorvastatin (LIPITOR) 10 MG tablet Take 10 mg by mouth every evening.     Marland Kitchen buPROPion (WELLBUTRIN XL) 300 MG 24 hr tablet Take 300 mg by mouth daily.    . Echinacea 125 MG CAPS Take 125 mg by mouth as needed (Cold prophylaxis).     . fluticasone (FLONASE) 50 MCG/ACT nasal spray Place 2 sprays into the nose daily.    Marland Kitchen ibuprofen (ADVIL) 200 MG tablet Take 400 mg by mouth every 6 (six) hours as needed for pain.     Marland Kitchen lisinopril (PRINIVIL,ZESTRIL) 10 MG tablet Take 1 tablet (10 mg total) by mouth daily. NEED OV. 30 tablet 0  . meloxicam (MOBIC) 15 MG tablet Take 15 mg by mouth daily.    . Multiple Vitamin (MULTIVITAMIN) tablet Take 1 tablet by mouth daily.    Marland Kitchen SYNTHROID 100 MCG tablet Take 100 mcg by mouth every other day.     Marland Kitchen SYNTHROID 88 MCG tablet Take 88 mcg by mouth every other day.     . tamoxifen (NOLVADEX) 20 MG tablet Take 1 tablet (20 mg total) by mouth daily. 90 tablet 3  . TURMERIC PO Take 2 capsules by mouth daily.    Marland Kitchen  zolpidem (AMBIEN) 10 MG tablet Take 10 mg by mouth at bedtime as needed for sleep.     Current Facility-Administered Medications  Medication Dose Route Frequency Provider Last Rate Last Dose  . 0.9 %  sodium chloride infusion  500 mL Intravenous Continuous Gatha Mayer, MD        PHYSICAL EXAMINATION: ECOG PERFORMANCE STATUS: 1 - Symptomatic but completely ambulatory  Vitals:   08/31/17 1137  BP: 114/67  Pulse: 62  Resp: 18  Temp: 98.1 F  (36.7 C)  SpO2: 100%   Filed Weights   08/31/17 1137  Weight: 135 lb 3.2 oz (61.3 kg)    GENERAL:alert, no distress and comfortable SKIN: skin color, texture, turgor are normal, no rashes or significant lesions EYES: normal, Conjunctiva are pink and non-injected, sclera clear OROPHARYNX:no exudate, no erythema and lips, buccal mucosa, and tongue normal  NECK: supple, thyroid normal size, non-tender, without nodularity LYMPH:  no palpable lymphadenopathy in the cervical, axillary or inguinal LUNGS: clear to auscultation and percussion with normal breathing effort HEART: regular rate & rhythm and no murmurs and no lower extremity edema ABDOMEN:abdomen soft, non-tender and normal bowel sounds MUSCULOSKELETAL:no cyanosis of digits and no clubbing  NEURO: alert & oriented x 3 with fluent speech, no focal motor/sensory deficits EXTREMITIES: No lower extremity edema BREAST: No palpable masses or nodules in either right or left breasts. No palpable axillary supraclavicular or infraclavicular adenopathy no breast tenderness or nipple discharge. (exam performed in the presence of a chaperone)  LABORATORY DATA:  I have reviewed the data as listed   Chemistry      Component Value Date/Time   NA 139 09/15/2016 0953   NA 139 08/26/2014 1615   K 4.8 09/15/2016 0953   K 4.2 08/26/2014 1615   CL 104 09/15/2016 0953   CO2 29 09/15/2016 0953   CO2 27 08/26/2014 1615   BUN 13 09/15/2016 0953   BUN 16.9 08/26/2014 1615   CREATININE 0.89 09/15/2016 0953   CREATININE 0.8 08/26/2014 1615      Component Value Date/Time   CALCIUM 9.6 09/15/2016 0953   CALCIUM 9.1 08/26/2014 1615   ALKPHOS 40 03/04/2015 1513   ALKPHOS 55 08/26/2014 1615   AST 22 03/04/2015 1513   AST 19 08/26/2014 1615   ALT 14 03/04/2015 1513   ALT 14 08/26/2014 1615   BILITOT 0.5 03/04/2015 1513   BILITOT 0.34 08/26/2014 1615       Lab Results  Component Value Date   WBC 4.1 09/15/2016   HGB 14.9 09/15/2016   HCT  44.0 09/15/2016   MCV 98.7 09/15/2016   PLT 214.0 09/15/2016   NEUTROABS 2.5 09/15/2016    ASSESSMENT & PLAN:  Breast cancer of upper-outer quadrant of right female breast Right breast invasive lobular cancer with LCIS T1 N0 M0 stage IA status post lumpectomy which did not show any further evidence of invasive breast cancer. ER positive PR positive HER-2 negative, received adjuvant radiation completed 09/04/2013, started her tamoxifen 20 mg daily from November 2013  Tamoxifen toxicities: Patient denies any hot flashes. She does have myalgias but it may not be related to tamoxifen. She will be on tamoxifen for 10 years Abdominal bloating of unclear etiology Breast cancer surveillance: 1. Breast exam 08/31/17  does not reveal any palpable lumps or nodules of concern. 2. Mammogram 08/28/2017  is normal, breast density category B 3. Breast MRI 01/25/2016: Negative, plan to do MRIs every other year  We previously discussed interaction between Wellbutrin and  tamoxifen.  Return to clinic in 1 year for follow-up   I spent 25 minutes talking to the patient of which more than half was spent in counseling and coordination of care.  Orders Placed This Encounter  Procedures  . MR BREAST BILATERAL W WO CONTRAST    Standing Status:   Future    Standing Expiration Date:   10/31/2018    Order Specific Question:   If indicated for the ordered procedure, I authorize the administration of contrast media per Radiology protocol    Answer:   Yes    Order Specific Question:   What is the patient's sedation requirement?    Answer:   No Sedation    Order Specific Question:   Does the patient have a pacemaker or implanted devices?    Answer:   No    Order Specific Question:   Radiology Contrast Protocol - do NOT remove file path    Answer:   [email or web address]    Order Specific Question:   Preferred imaging location?    Answer:   [facility] (table limit-350 lbs)    The patient has a good understanding of the overall plan. she agrees with it. she will call with any problems that may develop before the next visit here.   Rulon Eisenmenger, MD 08/31/17

## 2017-09-06 DIAGNOSIS — Z23 Encounter for immunization: Secondary | ICD-10-CM | POA: Diagnosis not present

## 2017-09-06 DIAGNOSIS — M5416 Radiculopathy, lumbar region: Secondary | ICD-10-CM | POA: Diagnosis not present

## 2017-09-21 DIAGNOSIS — R1084 Generalized abdominal pain: Secondary | ICD-10-CM | POA: Diagnosis not present

## 2017-09-21 DIAGNOSIS — N200 Calculus of kidney: Secondary | ICD-10-CM | POA: Diagnosis not present

## 2017-10-09 DIAGNOSIS — D225 Melanocytic nevi of trunk: Secondary | ICD-10-CM | POA: Diagnosis not present

## 2017-10-09 DIAGNOSIS — Z85828 Personal history of other malignant neoplasm of skin: Secondary | ICD-10-CM | POA: Diagnosis not present

## 2017-10-09 DIAGNOSIS — D1801 Hemangioma of skin and subcutaneous tissue: Secondary | ICD-10-CM | POA: Diagnosis not present

## 2017-10-09 DIAGNOSIS — Z23 Encounter for immunization: Secondary | ICD-10-CM | POA: Diagnosis not present

## 2017-10-09 DIAGNOSIS — L7211 Pilar cyst: Secondary | ICD-10-CM | POA: Diagnosis not present

## 2017-10-09 DIAGNOSIS — L814 Other melanin hyperpigmentation: Secondary | ICD-10-CM | POA: Diagnosis not present

## 2017-10-09 DIAGNOSIS — L821 Other seborrheic keratosis: Secondary | ICD-10-CM | POA: Diagnosis not present

## 2017-10-17 ENCOUNTER — Telehealth (INDEPENDENT_AMBULATORY_CARE_PROVIDER_SITE_OTHER): Payer: Self-pay | Admitting: Physical Medicine and Rehabilitation

## 2017-10-17 NOTE — Telephone Encounter (Signed)
Scheduled for 10/31/17 at 1445.

## 2017-10-17 NOTE — Telephone Encounter (Signed)
Would try repeat and we can talk about cymbalta at visit or just visit to tak about cymbalta but need to see before rx

## 2017-10-17 NOTE — Telephone Encounter (Signed)
Patient is checking on a drive and will call back to schedule.

## 2017-10-31 ENCOUNTER — Encounter (INDEPENDENT_AMBULATORY_CARE_PROVIDER_SITE_OTHER): Payer: Self-pay | Admitting: Physical Medicine and Rehabilitation

## 2017-10-31 ENCOUNTER — Ambulatory Visit (INDEPENDENT_AMBULATORY_CARE_PROVIDER_SITE_OTHER): Payer: Medicare Other

## 2017-10-31 ENCOUNTER — Ambulatory Visit (INDEPENDENT_AMBULATORY_CARE_PROVIDER_SITE_OTHER): Payer: Medicare Other | Admitting: Physical Medicine and Rehabilitation

## 2017-10-31 VITALS — BP 128/80 | HR 67 | Temp 97.7°F

## 2017-10-31 DIAGNOSIS — M48062 Spinal stenosis, lumbar region with neurogenic claudication: Secondary | ICD-10-CM | POA: Diagnosis not present

## 2017-10-31 DIAGNOSIS — M419 Scoliosis, unspecified: Secondary | ICD-10-CM

## 2017-10-31 DIAGNOSIS — Z79810 Long term (current) use of selective estrogen receptor modulators (SERMs): Secondary | ICD-10-CM

## 2017-10-31 DIAGNOSIS — R202 Paresthesia of skin: Secondary | ICD-10-CM | POA: Diagnosis not present

## 2017-10-31 DIAGNOSIS — M5416 Radiculopathy, lumbar region: Secondary | ICD-10-CM | POA: Diagnosis not present

## 2017-10-31 DIAGNOSIS — M792 Neuralgia and neuritis, unspecified: Secondary | ICD-10-CM

## 2017-10-31 MED ORDER — METHYLPREDNISOLONE ACETATE 80 MG/ML IJ SUSP
80.0000 mg | Freq: Once | INTRAMUSCULAR | Status: AC
  Administered 2017-10-31: 80 mg

## 2017-10-31 MED ORDER — LIDOCAINE HCL (PF) 1 % IJ SOLN
2.0000 mL | Freq: Once | INTRAMUSCULAR | Status: AC
  Administered 2017-10-31: 2 mL

## 2017-10-31 NOTE — Progress Notes (Deleted)
Pt C/O referred pain in lower back being painful. -BT's, + Driver, -Dye Allergies.

## 2017-10-31 NOTE — Patient Instructions (Signed)

## 2017-11-05 ENCOUNTER — Encounter: Payer: Self-pay | Admitting: Hematology and Oncology

## 2017-11-06 ENCOUNTER — Encounter (INDEPENDENT_AMBULATORY_CARE_PROVIDER_SITE_OTHER): Payer: Self-pay | Admitting: Physical Medicine and Rehabilitation

## 2017-11-06 NOTE — Progress Notes (Signed)
[name] - 71 y.o. female MRN [id number]  Date of birth: 28-Aug-1946  Office Visit Note: Visit Date: 10/31/2017 PCP: Hulan Fess, MD Referred by: Hulan Fess, MD  Subjective: Chief Complaint  Patient presents with  . Lower Back - Pain  . Right Hip - Pain  . Right Leg - Pain   HPI: [name] is a 71 year old female that we first saw in August 2016 and completed a right L4 and L5 transforaminal epidural steroid injection for right hip and leg pain and MRI findings of multifactorial stenosis at L4-5.  She has moderate canal stenosis and moderate lateral recess but severe right foraminal narrowing.  The first injection seemed to do quite well although she had periods where I think it would flare up to a degree and limit her ability to ambulate and walk that she would like to.  We did not see her again until November 2017.  At that point her symptoms were more on the left side and again more radicular pain.  Symptoms are somewhat vague in the sense that they can affect the hip and groin to a degree and do radiate down the leg.  She has not had any focal weakness or foot drop.  She has had no specific trauma.  She does use meloxicam and anti-inflammatories at times.  She has been questioning the use of Cymbalta for some time.  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  Her husband is a Teacher, music.  She is currently taking Wellbutrin.  Her case is complicated by history of breast cancer status post radiation as well as lumpectomy.  This was first diagnosed in 2014.  She currently takes tamoxifen.  She is followed by Dr. Nicholas Lose at [facility] hematology and oncology.  She reports that she responds extremely well to even a small dose of prednisone when the pain begins to worsen.  We have provided her in the past with a prescription for prednisone during a trip.  She reports that the injections are not really helping.  She states that helped for a few weeks and then the symptoms  returned.  Try to discuss with her today that at least the numbers of injections we have done is actually very small.  Again, we did one injection for the right side in August 2016 and then on the left side increased symptoms in November to March of the following year.  Specifically the last injection was in August of this year and it was a right L4 injection.  She is having more right-sided hip and leg pain today.  Her left side is not as problematic at all.  She does have pain across the lower back as well.  We have not completed facet joint blocks.  Her biggest symptoms are in the legs.    Review of Systems  Constitutional: Negative for chills, fever, malaise/fatigue and weight loss.  HENT: Negative for hearing loss and sinus pain.   Eyes: Negative for blurred vision, double vision and photophobia.  Respiratory: Negative for cough and shortness of breath.   Cardiovascular: Negative for chest pain, palpitations and leg swelling.  Gastrointestinal: Negative for abdominal pain, nausea and vomiting.  Genitourinary: Negative for flank pain.  Musculoskeletal: Positive for back pain and joint pain. Negative for myalgias.       Right hip and leg pain  Skin: Negative for itching and rash.  Neurological: Negative for tremors, focal weakness and weakness.  Endo/Heme/Allergies: Negative.   Psychiatric/Behavioral: Negative  for depression.  All other systems reviewed and are negative.  Otherwise per HPI.  Assessment & Plan: Visit Diagnoses:  1. Spinal stenosis of lumbar region with neurogenic claudication   2. Scoliosis of lumbar spine, unspecified scoliosis type   3. Lumbar radiculopathy   4. Paresthesia of skin   5. Neurogenic pain   6. Use of tamoxifen (Nolvadex)     Plan: Findings:  Chronic pain syndrome and chronic worsening bilateral hip and leg pain which seems to alternate at times between the 2 legs but worsening recently on the right.  She reports getting relief with the epidural type  injections for a few weeks at a time.  There has been long stretches between injections so from an injection standpoint we are nowhere near any concern in terms of numbers.  Some of this I think his communication differences on what is helping and how it is helping.  She does have a medical background is been asking about the use of Cymbalta or duloxetine in terms of potential for pain relief of her knee arthritis as well as potential for relief of the hip and leg pain.  My concern for the duloxetine is the concurrent use of Wellbutrin as well as tamoxifen.  These medications are metabolized by the cytochrome P4 50 complex.  I did read a note from her oncologist who commented on the fact about Wellbutrin and this is well.  I am not as concerned about things such as serotonin type syndrome with these 2 medications so I do not think that is an issue.  I think we should give the injection still a consistent try to see if we can just get this to calm down for quite a while.  The first time we saw her she seem to do much better from 2016-2017.  The next step after injection or possibly using the medication would be to update her MRI if she is continuing to have worsening symptoms.  The biggest problem may be the right sided foraminal narrowing although she had a good bit of relief for a while with foraminal injection in 2016.  We discussed doing the interlaminar approach today which is see how she does with that.  Going to make sure this note get sent to her oncologist and she is getting discussed using the Cymbalta with him.  She is almost 5 years out from her cancer diagnosis.    Meds & Orders:  Meds ordered this encounter  Medications  . lidocaine (PF) (XYLOCAINE) 1 % injection 2 mL  . methylPREDNISolone acetate (DEPO-MEDROL) injection 80 mg    Orders Placed This Encounter  Procedures  . XR C-ARM NO REPORT  . Epidural Steroid injection    Follow-up: Return if symptoms worsen or fail to improve.    Procedures: No procedures performed  Lumbar Epidural Steroid Injection - Interlaminar Approach with Fluoroscopic Guidance  Patient: [name]      Date of Birth: 1946/04/24 MRN: [id number] PCP: Hulan Fess, MD      Visit Date: 10/31/2017   Universal Protocol:     Consent Given By: the patient  Position: PRONE  Additional Comments: Vital signs were monitored before and after the procedure. Patient was prepped and draped in the usual sterile fashion. The correct patient, procedure, and site was verified.   Injection Procedure Details:  Procedure Site One Meds Administered:  Meds ordered this encounter  Medications  . lidocaine (PF) (XYLOCAINE) 1 % injection 2 mL  . methylPREDNISolone acetate (DEPO-MEDROL)  injection 80 mg     Laterality: Right  Location/Site:  L4-L5  Needle size: 20 G  Needle type: Tuohy  Needle Placement: Paramedian epidural  Findings:   -Comments: Excellent flow of contrast along the nerve and into the epidural space.  Because of the anatomy and increased obliquity of the approach the needle did cross midline and there was flow of contrast left and right.  Patient did fill the medication upon entry more on the left side.  With the interlaminar approach that should circulate to both sides.  Again this may portend that the better approach may be the foraminal approach even though she has a significant amount of foraminal stenosis here.  Procedure Details: Using a paramedian approach from the side mentioned above, the region overlying the inferior lamina was localized under fluoroscopic visualization and the soft tissues overlying this structure were infiltrated with 4 ml. of 1% Lidocaine without Epinephrine. The Tuohy needle was inserted into the epidural space using a paramedian approach.   The epidural space was localized using loss of resistance along with lateral and bi-planar fluoroscopic views.  After negative aspirate for air, blood, and  CSF, a 2 ml. volume of Isovue-250 was injected into the epidural space and the flow of contrast was observed. Radiographs were obtained for documentation purposes.    The injectate was administered into the level noted above.   Additional Comments:  The patient tolerated the procedure well Dressing: Band-Aid    Post-procedure details: Patient was observed during the procedure. Post-procedure instructions were reviewed.  Patient left the clinic in stable condition.  Pertinent Imaging: Right L4  transforaminal injection 07/10/2017  Left L4-5 interlaminar epidural 02/06/2017  Left L4 and L5 transforaminal injection 09/26/2016  Right L4 and L5 transforaminal injection 07/20/2015  Lumbar spine MRI 07/03/2015   L4-5: Prominent circumferential disc bulging and moderate facet and ligamentum flavum hypertrophy result in moderate spinal stenosis, moderate bilateral lateral recess stenosis, and severe right and moderate left neural foraminal stenosis. The L5 nerve roots could be affected in the lateral recesses, and the right L4 nerve root may be affected in the neural foramen.  L5-S1: Mild disc bulging and moderate facet arthrosis result in minimal bilateral neural foraminal stenosis without spinal stenosis.  IMPRESSION: Multilevel lumbar disc and facet degeneration, most notable at L4-5 where there is moderate spinal stenosis, bilateral lateral recess stenosis, and severe right neural foraminal stenosis.    Clinical History: Right L4  transforaminal injection 07/10/2017  Left L4-5 interlaminar epidural 02/06/2017  Left L4 and L5 transforaminal injection 09/26/2016  Right L4 and L5 transforaminal injection 07/20/2015  Lumbar spine MRI 07/03/2015   L4-5: Prominent circumferential disc bulging and moderate facet and ligamentum flavum hypertrophy result in moderate spinal stenosis, moderate bilateral lateral recess stenosis, and severe right and moderate left neural foraminal  stenosis. The L5 nerve roots could be affected in the lateral recesses, and the right L4 nerve root may be affected in the neural foramen.  L5-S1: Mild disc bulging and moderate facet arthrosis result in minimal bilateral neural foraminal stenosis without spinal stenosis.  IMPRESSION: Multilevel lumbar disc and facet degeneration, most notable at L4-5 where there is moderate spinal stenosis, bilateral lateral recess stenosis, and severe right neural foraminal stenosis.  She reports that  has never smoked. she has never used smokeless tobacco. No results for input(s): HGBA1C, LABURIC in the last 8760 hours.  Objective:  VS:  HT:    WT:   BMI:     BP:128/80  HR:67bpm  TEMP:97.7 F (36.5 C)(Oral)  RESP:99 % Physical Exam  Constitutional: She is oriented to person, place, and time. She appears well-developed and well-nourished. No distress.  HENT:  Head: Normocephalic and atraumatic.  Nose: Nose normal.  Mouth/Throat: Oropharynx is clear and moist.  Eyes: Conjunctivae are normal. Pupils are equal, round, and reactive to light.  Neck: Normal range of motion. Neck supple.  Cardiovascular: Regular rhythm and intact distal pulses.  Pulmonary/Chest: Effort normal. No respiratory distress.  Abdominal: She exhibits no distension. There is no guarding.  Musculoskeletal:  Patient is a little slow to rise from a seated position.  She has no pain with hip rotation.  She has good distal strength.  She has no clonus.  Examination of the lumbar spine shows fairly normal anatomic alignment with mild scoliosis.  She has no focal trigger points.  Neurological: She is alert and oriented to person, place, and time. She exhibits normal muscle tone. Coordination normal.  Skin: Skin is warm. No rash noted. No erythema.  Psychiatric: She has a normal mood and affect. Her behavior is normal.  Nursing note and vitals reviewed.   Ortho Exam Imaging: No results found.  Past  Medical/Family/Surgical/Social History: Medications & Allergies reviewed per EMR Patient Active Problem List   Diagnosis Date Noted  . Essential hypertension 08/26/2016  . Mitral valve disorder 08/26/2016  . History of palpitations 08/26/2016  . Dyslipidemia 08/26/2016  . Internal hemorrhoids 08/03/2015  . Rectal bleeding 08/03/2015  . Hx of radiation therapy   . Breast cancer of upper-outer quadrant of right female breast ([place]) 05/23/2013  . Personal history of colonic polyps 01/06/2012  . Irritable bowel syndrome - constipation predominant 01/06/2012   Past Medical History:  Diagnosis Date  . Adenomatous colon polyp 03/07/2012  . Allergic rhinitis   . Arthritis   . Breast cancer ([place]) 05/20/13   right  . Diverticulosis   . Graves' disease    2011  . HLD (hyperlipidemia)   . Hx of radiation therapy 08/13/13- 09/06/13   right breast 4250 cGy 17 sessions  . Hypertension   . IBS (irritable bowel syndrome)   . Osteopenia   . Personal history of radiation therapy    Family History  Problem Relation Age of Onset  . Heart disease Mother 16       MI  . Lung cancer Mother   . Hypertension Mother   . Alcohol abuse Father   . COPD Father   . Colon cancer Maternal Grandfather 10  . Diabetes Paternal Grandmother   . Breast cancer Maternal Aunt   . Pancreatic cancer Maternal Aunt   . Stomach cancer Neg Hx    Past Surgical History:  Procedure Laterality Date  . BREAST BIOPSY Right   . BREAST LUMPECTOMY     06/11/13  with lymph node biopsy  . BREAST LUMPECTOMY WITH NEEDLE LOCALIZATION AND AXILLARY SENTINEL LYMPH NODE BX Right 06/11/2013   Procedure: BREAST LUMPECTOMY WITH NEEDLE LOCALIZATION AND AXILLARY SENTINEL LYMPH NODE BX;  Surgeon: Harl Bowie, MD;  Location: [facility];  Service: General;  Laterality: Right;  Needle localization BCG 7:30   . COLONOSCOPY W/ BIOPSIES AND POLYPECTOMY  multiple  . KNEE ARTHROSCOPY Left    Social History   Occupational History  .  Occupation: Programmer, multimedia: UNEMPLOYED  Tobacco Use  . Smoking status: Never Smoker  . Smokeless tobacco: Never Used  Substance and Sexual Activity  . Alcohol use: Yes    Alcohol/week: 0.0 oz  Comment: occasional  . Drug use: No  . Sexual activity: Yes    Comment: menarche 65, G 1 age 30, menopause 68, HRT x 9 yrs

## 2017-11-06 NOTE — Procedures (Signed)
Lumbar Epidural Steroid Injection - Interlaminar Approach with Fluoroscopic Guidance  Patient: [name]      Date of Birth: [birth year] MRN: [id number] PCP: Hulan Fess, MD      Visit Date: 10/31/2017   Universal Protocol:     Consent Given By: the patient  Position: PRONE  Additional Comments: Vital signs were monitored before and after the procedure. Patient was prepped and draped in the usual sterile fashion. The correct patient, procedure, and site was verified.   Injection Procedure Details:  Procedure Site One Meds Administered:  Meds ordered this encounter  Medications  . lidocaine (PF) (XYLOCAINE) 1 % injection 2 mL  . methylPREDNISolone acetate (DEPO-MEDROL) injection 80 mg     Laterality: Right  Location/Site:  L4-L5  Needle size: 20 G  Needle type: Tuohy  Needle Placement: Paramedian epidural  Findings:   -Comments: Excellent flow of contrast along the nerve and into the epidural space.  Because of the anatomy and increased obliquity of the approach the needle did cross midline and there was flow of contrast left and right.  Patient did fill the medication upon entry more on the left side.  With the interlaminar approach that should circulate to both sides.  Again this may portend that the better approach may be the foraminal approach even though she has a significant amount of foraminal stenosis here.  Procedure Details: Using a paramedian approach from the side mentioned above, the region overlying the inferior lamina was localized under fluoroscopic visualization and the soft tissues overlying this structure were infiltrated with 4 ml. of 1% Lidocaine without Epinephrine. The Tuohy needle was inserted into the epidural space using a paramedian approach.   The epidural space was localized using loss of resistance along with lateral and bi-planar fluoroscopic views.  After negative aspirate for air, blood, and CSF, a 2 ml. volume of Isovue-250 was  injected into the epidural space and the flow of contrast was observed. Radiographs were obtained for documentation purposes.    The injectate was administered into the level noted above.   Additional Comments:  The patient tolerated the procedure well Dressing: Band-Aid    Post-procedure details: Patient was observed during the procedure. Post-procedure instructions were reviewed.  Patient left the clinic in stable condition.  Pertinent Imaging: Right L4  transforaminal injection 07/10/2017  Left L4-5 interlaminar epidural 02/06/2017  Left L4 and L5 transforaminal injection 09/26/2016  Right L4 and L5 transforaminal injection 07/20/2015  Lumbar spine MRI 07/03/2015   L4-5: Prominent circumferential disc bulging and moderate facet and ligamentum flavum hypertrophy result in moderate spinal stenosis, moderate bilateral lateral recess stenosis, and severe right and moderate left neural foraminal stenosis. The L5 nerve roots could be affected in the lateral recesses, and the right L4 nerve root may be affected in the neural foramen.  L5-S1: Mild disc bulging and moderate facet arthrosis result in minimal bilateral neural foraminal stenosis without spinal stenosis.  IMPRESSION: Multilevel lumbar disc and facet degeneration, most notable at L4-5 where there is moderate spinal stenosis, bilateral lateral recess stenosis, and severe right neural foraminal stenosis.

## 2017-11-29 DIAGNOSIS — Z23 Encounter for immunization: Secondary | ICD-10-CM | POA: Diagnosis not present

## 2017-11-29 DIAGNOSIS — L7211 Pilar cyst: Secondary | ICD-10-CM | POA: Diagnosis not present

## 2017-12-14 DIAGNOSIS — L723 Sebaceous cyst: Secondary | ICD-10-CM | POA: Diagnosis not present

## 2017-12-14 DIAGNOSIS — B999 Unspecified infectious disease: Secondary | ICD-10-CM | POA: Diagnosis not present

## 2017-12-22 DIAGNOSIS — N200 Calculus of kidney: Secondary | ICD-10-CM | POA: Diagnosis not present

## 2017-12-27 DIAGNOSIS — L98491 Non-pressure chronic ulcer of skin of other sites limited to breakdown of skin: Secondary | ICD-10-CM | POA: Diagnosis not present

## 2018-02-01 ENCOUNTER — Telehealth: Payer: Self-pay | Admitting: Cardiology

## 2018-02-01 NOTE — Telephone Encounter (Signed)
Closed Encounter  °

## 2018-02-20 ENCOUNTER — Ambulatory Visit: Payer: Medicare Other | Admitting: Cardiology

## 2018-02-22 ENCOUNTER — Ambulatory Visit: Payer: Medicare Other | Admitting: Cardiology

## 2018-02-22 ENCOUNTER — Ambulatory Visit: Payer: Medicare Other | Admitting: Physician Assistant

## 2018-02-27 ENCOUNTER — Ambulatory Visit (INDEPENDENT_AMBULATORY_CARE_PROVIDER_SITE_OTHER): Payer: Self-pay

## 2018-02-27 ENCOUNTER — Ambulatory Visit (INDEPENDENT_AMBULATORY_CARE_PROVIDER_SITE_OTHER): Payer: Medicare Other | Admitting: Orthopaedic Surgery

## 2018-02-27 ENCOUNTER — Encounter (INDEPENDENT_AMBULATORY_CARE_PROVIDER_SITE_OTHER): Payer: Self-pay | Admitting: Orthopaedic Surgery

## 2018-02-27 ENCOUNTER — Ambulatory Visit (INDEPENDENT_AMBULATORY_CARE_PROVIDER_SITE_OTHER): Payer: Medicare Other

## 2018-02-27 VITALS — BP 137/84 | HR 59 | Resp 16 | Ht 65.5 in | Wt 136.0 lb

## 2018-02-27 DIAGNOSIS — M1712 Unilateral primary osteoarthritis, left knee: Secondary | ICD-10-CM | POA: Diagnosis not present

## 2018-02-27 DIAGNOSIS — M25562 Pain in left knee: Secondary | ICD-10-CM

## 2018-02-27 DIAGNOSIS — G8929 Other chronic pain: Secondary | ICD-10-CM

## 2018-02-27 DIAGNOSIS — M25561 Pain in right knee: Secondary | ICD-10-CM

## 2018-02-27 DIAGNOSIS — M1711 Unilateral primary osteoarthritis, right knee: Secondary | ICD-10-CM | POA: Diagnosis not present

## 2018-02-27 NOTE — Progress Notes (Signed)
Office Visit Note   Patient: [name]           Date of Birth: 1946-04-02           MRN: [id number] Visit Date: 02/27/2018              Requested by: Hulan Fess, MD [address], [place] [id number] PCP: Hulan Fess, MD   Assessment & Plan: Visit Diagnoses:  1. Chronic pain of left knee   2. Unilateral primary osteoarthritis, left knee   3. Unilateral primary osteoarthritis, right knee   4. Chronic pain of right knee     Plan: #1: She does not want to have corticosteroid injection so therefore we will plan on having precertification of Hyalgan injections. #2: Follow back up once the Hyalgan have been approved.  Follow-Up Instructions: Return if symptoms worsen or fail to improve.   Orders:  Orders Placed This Encounter  Procedures  . XR KNEE 3 VIEW RIGHT  . XR KNEE 3 VIEW LEFT   No orders of the defined types were placed in this encounter.     Procedures: No procedures performed   Clinical Data: No additional findings.   Subjective: Chief Complaint  Patient presents with  . Left Knee - Pain  . Right Knee - Pain  . Knee Pain    Bil knee pain worsening x 4-5 months, difficulty standing, not diabetic, right knee arthroscopy - Dr. Durward Fortes, recent diagnosis of gout    HPI  [name] is a 72 year old white female who is seen today for evaluation of bilateral knee pain.  She has had in the past Hyalgan injections with cortisone injections.  Her last one was approximately 2 years ago and had been quite beneficial.  She however is now having increasing pain discomfort.  She has had to stop her tennis which she has some benefit.  She has some difficulty at times with sitting for prolonged periods of time.  She would like to forego cortisone injections and consider Hyalgan injections to help her through this pain.   Review of Systems  Constitutional: Positive for activity change and fatigue.  HENT: Negative for trouble swallowing.   Eyes:  Negative for pain.  Respiratory: Negative for shortness of breath.   Cardiovascular: Negative for leg swelling.  Gastrointestinal: Negative for constipation.  Endocrine: Negative for cold intolerance.  Genitourinary: Negative for difficulty urinating.  Musculoskeletal: Negative for joint swelling.  Skin: Negative for rash.  Allergic/Immunologic: Negative for food allergies.  Neurological: Negative for weakness.  Hematological: Bruises/bleeds easily.  Psychiatric/Behavioral: Negative for sleep disturbance.     Objective: Vital Signs: BP 137/84 (BP Location: Left Arm, Patient Position: Sitting, Cuff Size: Normal)   Pulse (!) 59   Resp 16   Ht 5' 5.5" (1.664 m)   Wt 136 lb (61.7 kg)   BMI 22.29 kg/m   Physical Exam  Constitutional: She is oriented to person, place, and time. She appears well-developed and well-nourished.  HENT:  Head: Normocephalic and atraumatic.  Eyes: Pupils are equal, round, and reactive to light. EOM are normal.  Pulmonary/Chest: Effort normal.  Neurological: She is alert and oriented to person, place, and time.  Skin: Skin is warm and dry.  Psychiatric: She has a normal mood and affect. Her behavior is normal. Judgment and thought content normal.    Ortho Exam  Exam today reveals range of motion just shy of extension to around 95 degrees bilaterally.  She does have some crepitance at the terminal  extension.  Trace effusion bilaterally.  No warmth or erythema.  Good hip motion without pain.  Immensely stable.  Specialty Comments:  No specialty comments available.  Imaging: Xr Knee 3 View Left  Result Date: 02/27/2018 Three-view x-ray of the left knee reveals periarticular spurring again diffusely both medially and laterally.  Narrowing of the medial compartment is noted.  Sclerosing of the medial tibial plateau more than the medial femoral condyle is also noted.  Particular spurring is also noted in the patellofemoral joint with degenerative changes  noted.  Xr Knee 3 View Right  Result Date: 02/27/2018 Three-view x-ray of the right knee reveals periarticular spurring more along the medial and lateral tibial plateau.  Some sclerosis more lateral than medial. Significant periarticular spurring over the patellofemoral joint noted.    PMFS History: Current Outpatient Medications  Medication Sig Dispense Refill  . atorvastatin (LIPITOR) 10 MG tablet Take 10 mg by mouth every evening.     Marland Kitchen buPROPion (WELLBUTRIN XL) 300 MG 24 hr tablet Take 300 mg by mouth daily.    . Calcium Carb-Cholecalciferol (CALCIUM 1000 + D) 1000-800 MG-UNIT TABS Take 1 tablet by mouth daily.    . Echinacea 125 MG CAPS Take 125 mg by mouth as needed (Cold prophylaxis).     . fluticasone (FLONASE) 50 MCG/ACT nasal spray Place 2 sprays into the nose daily.    Marland Kitchen ibuprofen (ADVIL) 200 MG tablet Take 400 mg by mouth every 6 (six) hours as needed for pain.     Marland Kitchen lisinopril (PRINIVIL,ZESTRIL) 10 MG tablet Take 1 tablet (10 mg total) by mouth daily. NEED OV. 30 tablet 0  . meloxicam (MOBIC) 15 MG tablet Take 15 mg by mouth daily.    . Multiple Vitamin (MULTIVITAMIN) tablet Take 1 tablet by mouth daily.    Marland Kitchen SYNTHROID 100 MCG tablet Take 100 mcg by mouth every other day.     Marland Kitchen SYNTHROID 88 MCG tablet Take 88 mcg by mouth every other day.     . tamoxifen (NOLVADEX) 20 MG tablet Take 1 tablet (20 mg total) by mouth daily. 90 tablet 3  . TURMERIC PO Take 2 capsules by mouth daily.    Marland Kitchen zolpidem (AMBIEN) 10 MG tablet Take 10 mg by mouth at bedtime as needed for sleep.     Current Facility-Administered Medications  Medication Dose Route Frequency Provider Last Rate Last Dose  . 0.9 %  sodium chloride infusion  500 mL Intravenous Continuous Gatha Mayer, MD         Patient Active Problem List   Diagnosis Date Noted  . Essential hypertension 08/26/2016  . Mitral valve disorder 08/26/2016  . History of palpitations 08/26/2016  . Dyslipidemia 08/26/2016  . Internal  hemorrhoids 08/03/2015  . Rectal bleeding 08/03/2015  . Hx of radiation therapy   . Breast cancer of upper-outer quadrant of right female breast ([facility]) 05/23/2013  . Personal history of colonic polyps 01/06/2012  . Irritable bowel syndrome - constipation predominant 01/06/2012   Past Medical History:  Diagnosis Date  . Adenomatous colon polyp 03/07/2012  . Allergic rhinitis   . Arthritis   . Breast cancer ([facility]) 05/20/13   right  . Diverticulosis   . Graves' disease    2011  . HLD (hyperlipidemia)   . Hx of radiation therapy 08/13/13- 09/06/13   right breast 4250 cGy 17 sessions  . Hypertension   . IBS (irritable bowel syndrome)   . Kidney stones   . Osteopenia   . Personal  history of radiation therapy     Family History  Problem Relation Age of Onset  . Heart disease Mother 64       MI  . Lung cancer Mother   . Hypertension Mother   . Alcohol abuse Father   . COPD Father   . Colon cancer Maternal Grandfather 63  . Diabetes Paternal Grandmother   . Breast cancer Maternal Aunt   . Pancreatic cancer Maternal Aunt   . Stomach cancer Neg Hx     Past Surgical History:  Procedure Laterality Date  . BREAST BIOPSY Right   . BREAST LUMPECTOMY     06/11/13  with lymph node biopsy  . BREAST LUMPECTOMY WITH NEEDLE LOCALIZATION AND AXILLARY SENTINEL LYMPH NODE BX Right 06/11/2013   Procedure: BREAST LUMPECTOMY WITH NEEDLE LOCALIZATION AND AXILLARY SENTINEL LYMPH NODE BX;  Surgeon: Harl Bowie, MD;  Location: [facility];  Service: General;  Laterality: Right;  Needle localization BCG 7:30   . COLONOSCOPY W/ BIOPSIES AND POLYPECTOMY  multiple  . KNEE ARTHROSCOPY Left   . KNEE ARTHROSCOPY     Social History   Occupational History  . Occupation: Programmer, multimedia: UNEMPLOYED  Tobacco Use  . Smoking status: Never Smoker  . Smokeless tobacco: Never Used  Substance and Sexual Activity  . Alcohol use: Yes    Alcohol/week: 0.0 oz    Comment: occasional  . Drug use: No  . Sexual  activity: Yes    Comment: menarche 53, G 1 age 47, menopause 69, HRT x 9 yrs

## 2018-03-07 ENCOUNTER — Telehealth (INDEPENDENT_AMBULATORY_CARE_PROVIDER_SITE_OTHER): Payer: Self-pay | Admitting: *Deleted

## 2018-03-07 DIAGNOSIS — M79671 Pain in right foot: Secondary | ICD-10-CM | POA: Diagnosis not present

## 2018-03-07 NOTE — Telephone Encounter (Signed)
LMOM, please call patient to schedule Hyalgan inj x 5, bilateral, buy and bill with Brian/Dr. Durward Fortes. Thank you.

## 2018-03-12 ENCOUNTER — Encounter (INDEPENDENT_AMBULATORY_CARE_PROVIDER_SITE_OTHER): Payer: Self-pay | Admitting: Orthopaedic Surgery

## 2018-03-12 ENCOUNTER — Ambulatory Visit (INDEPENDENT_AMBULATORY_CARE_PROVIDER_SITE_OTHER): Payer: Medicare Other | Admitting: Orthopaedic Surgery

## 2018-03-12 VITALS — BP 133/84 | HR 57 | Resp 16 | Ht 65.5 in | Wt 138.0 lb

## 2018-03-12 DIAGNOSIS — M170 Bilateral primary osteoarthritis of knee: Secondary | ICD-10-CM | POA: Diagnosis not present

## 2018-03-12 MED ORDER — SODIUM HYALURONATE (VISCOSUP) 20 MG/2ML IX SOSY
20.0000 mg | PREFILLED_SYRINGE | INTRA_ARTICULAR | Status: AC | PRN
  Administered 2018-03-12: 20 mg via INTRA_ARTICULAR

## 2018-03-12 NOTE — Progress Notes (Signed)
Office Visit Note   Patient: [name]           Date of Birth: March 20, 1946           MRN: [id number] Visit Date: 03/12/2018              Requested by: Hulan Fess, MD [place], [place] [id number] PCP: Hulan Fess, MD   Assessment & Plan: Visit Diagnoses:  1. Bilateral primary osteoarthritis of knee     Plan: Bilateral Hyalgan [place] supplementation injections to the knees.  Return weekly for the next 4 weeks to complete the series  Follow-Up Instructions: Return in about 1 week (around 03/19/2018).   Orders:  No orders of the defined types were placed in this encounter.  No orders of the defined types were placed in this encounter.     Procedures: Large Joint Inj: L knee on 03/12/2018 9:42 AM Indications: pain and joint swelling Details: 25 G 1.5 in needle, anteromedial approach  Arthrogram: No  Medications: 20 mg Sodium Hyaluronate 20 MG/2ML Outcome: tolerated well, no immediate complications Procedure, treatment alternatives, risks and benefits explained, specific risks discussed. Consent was given by the patient. Immediately prior to procedure a time out was called to verify the correct patient, procedure, equipment, support staff and site/side marked as required. Patient was prepped and draped in the usual sterile fashion.   Large Joint Inj: R knee on 03/12/2018 9:42 AM Indications: pain and joint swelling Details: 25 G 1.5 in needle  Arthrogram: No  Medications: 20 mg Sodium Hyaluronate 20 MG/2ML Outcome: tolerated well, no immediate complications Procedure, treatment alternatives, risks and benefits explained, specific risks discussed. Consent was given by the patient. Immediately prior to procedure a time out was called to verify the correct patient, procedure, equipment, support staff and site/side marked as required. Patient was prepped and draped in the usual sterile fashion.       Clinical Data: No additional  findings.   Subjective: Chief Complaint  Patient presents with  . Follow-up    hyalagan bil lat knee inj.  Has been approved for Hyalgan injections.  Will initiate today  HPI  Review of Systems   Objective: Vital Signs: BP 133/84 (BP Location: Left Arm, Patient Position: Sitting, Cuff Size: Normal)   Pulse (!) 57   Resp 16   Ht 5' 5.5" (1.664 m)   Wt 138 lb (62.6 kg)   BMI 22.62 kg/m   Physical Exam  Ortho Exam awake alert and oriented x3 comfortable sitting.  Full range of motion both knees predominant medial joint pain bilaterally.  No effusion.  Specialty Comments:  No specialty comments available.  Imaging: No results found.   PMFS History: Patient Active Problem List   Diagnosis Date Noted  . Bilateral primary osteoarthritis of knee 03/12/2018  . Essential hypertension 08/26/2016  . Mitral valve disorder 08/26/2016  . History of palpitations 08/26/2016  . Dyslipidemia 08/26/2016  . Internal hemorrhoids 08/03/2015  . Rectal bleeding 08/03/2015  . Hx of radiation therapy   . Breast cancer of upper-outer quadrant of right female breast ([facility]) 05/23/2013  . Personal history of colonic polyps 01/06/2012  . Irritable bowel syndrome - constipation predominant 01/06/2012   Past Medical History:  Diagnosis Date  . Adenomatous colon polyp 03/07/2012  . Allergic rhinitis   . Arthritis   . Breast cancer ([facility]) 05/20/13   right  . Diverticulosis   . Graves' disease    2011  . HLD (hyperlipidemia)   .  Hx of radiation therapy 08/13/13- 09/06/13   right breast 4250 cGy 17 sessions  . Hypertension   . IBS (irritable bowel syndrome)   . Kidney stones   . Osteopenia   . Personal history of radiation therapy     Family History  Problem Relation Age of Onset  . Heart disease Mother 69       MI  . Lung cancer Mother   . Hypertension Mother   . Alcohol abuse Father   . COPD Father   . Colon cancer Maternal Grandfather 45  . Diabetes Paternal Grandmother   .  Breast cancer Maternal Aunt   . Pancreatic cancer Maternal Aunt   . Stomach cancer Neg Hx     Past Surgical History:  Procedure Laterality Date  . BREAST BIOPSY Right   . BREAST LUMPECTOMY     06/11/13  with lymph node biopsy  . BREAST LUMPECTOMY WITH NEEDLE LOCALIZATION AND AXILLARY SENTINEL LYMPH NODE BX Right 06/11/2013   Procedure: BREAST LUMPECTOMY WITH NEEDLE LOCALIZATION AND AXILLARY SENTINEL LYMPH NODE BX;  Surgeon: Harl Bowie, MD;  Location: [facility];  Service: General;  Laterality: Right;  Needle localization BCG 7:30   . COLONOSCOPY W/ BIOPSIES AND POLYPECTOMY  multiple  . KNEE ARTHROSCOPY Left   . KNEE ARTHROSCOPY     Social History   Occupational History  . Occupation: Programmer, multimedia: UNEMPLOYED  Tobacco Use  . Smoking status: Never Smoker  . Smokeless tobacco: Never Used  Substance and Sexual Activity  . Alcohol use: Yes    Alcohol/week: 0.0 oz    Comment: occasional  . Drug use: No  . Sexual activity: Yes    Comment: menarche 40, 89 1 age 39, menopause 89, HRT x 9 yrs     Garald Balding, MD   Note - This record has been created using [employer].  Chart creation errors have been sought, but may not always  have been located. Such creation errors do not reflect on  the standard of medical care.

## 2018-03-19 ENCOUNTER — Ambulatory Visit (INDEPENDENT_AMBULATORY_CARE_PROVIDER_SITE_OTHER): Payer: Medicare Other | Admitting: Orthopaedic Surgery

## 2018-03-19 ENCOUNTER — Encounter (INDEPENDENT_AMBULATORY_CARE_PROVIDER_SITE_OTHER): Payer: Self-pay | Admitting: Orthopaedic Surgery

## 2018-03-19 VITALS — BP 116/79 | HR 65 | Resp 18 | Ht 65.5 in | Wt 138.0 lb

## 2018-03-19 DIAGNOSIS — M170 Bilateral primary osteoarthritis of knee: Secondary | ICD-10-CM | POA: Diagnosis not present

## 2018-03-19 MED ORDER — SODIUM HYALURONATE (VISCOSUP) 20 MG/2ML IX SOSY
20.0000 mg | PREFILLED_SYRINGE | INTRA_ARTICULAR | Status: AC | PRN
  Administered 2018-03-19: 20 mg via INTRA_ARTICULAR

## 2018-03-19 NOTE — Progress Notes (Signed)
Office Visit Note   Patient: [name]           Date of Birth: 01-Sep-1946           MRN: [id number] Visit Date: 03/19/2018              Requested by: Hulan Fess, MD [place], [place] [id number] PCP: Hulan Fess, MD   Assessment & Plan: Visit Diagnoses:  1. Bilateral primary osteoarthritis of knee     Plan: Second Euflexxa injections both knees today.  Without problem.  Office weekly for the next 3 weeks to finish the series  Follow-Up Instructions: Return in about 1 week (around 03/26/2018).   Orders:  No orders of the defined types were placed in this encounter.  No orders of the defined types were placed in this encounter.     Procedures: Large Joint Inj: L knee on 03/19/2018 12:10 PM Indications: pain and joint swelling Details: 25 G 1.5 in needle, anteromedial approach  Arthrogram: No  Medications: 20 mg Sodium Hyaluronate 20 MG/2ML Outcome: tolerated well, no immediate complications Procedure, treatment alternatives, risks and benefits explained, specific risks discussed. Consent was given by the patient. Immediately prior to procedure a time out was called to verify the correct patient, procedure, equipment, support staff and site/side marked as required. Patient was prepped and draped in the usual sterile fashion.   Large Joint Inj: R knee on 03/19/2018 12:10 PM Indications: pain and joint swelling Details: 25 G 1.5 in needle  Arthrogram: No  Medications: 20 mg Sodium Hyaluronate 20 MG/2ML Outcome: tolerated well, no immediate complications Procedure, treatment alternatives, risks and benefits explained, specific risks discussed. Consent was given by the patient. Immediately prior to procedure a time out was called to verify the correct patient, procedure, equipment, support staff and site/side marked as required. Patient was prepped and draped in the usual sterile fashion.       Clinical Data: No additional  findings.   Subjective: No chief complaint on file. No problems related to the bilateral Hyalgan injections to the knees last week  HPI  Review of Systems   Objective: Vital Signs: There were no vitals taken for this visit.  Physical Exam  Ortho Exam awake alert and oriented x3.  Comfortable sitting.  Neither knee is hot red or swollen.  No local tenderness  Specialty Comments:  No specialty comments available.  Imaging: No results found.   PMFS History: Patient Active Problem List   Diagnosis Date Noted  . Bilateral primary osteoarthritis of knee 03/12/2018  . Essential hypertension 08/26/2016  . Mitral valve disorder 08/26/2016  . History of palpitations 08/26/2016  . Dyslipidemia 08/26/2016  . Internal hemorrhoids 08/03/2015  . Rectal bleeding 08/03/2015  . Hx of radiation therapy   . Breast cancer of upper-outer quadrant of right female breast ([facility]) 05/23/2013  . Personal history of colonic polyps 01/06/2012  . Irritable bowel syndrome - constipation predominant 01/06/2012   Past Medical History:  Diagnosis Date  . Adenomatous colon polyp 03/07/2012  . Allergic rhinitis   . Arthritis   . Breast cancer ([facility]) 05/20/13   right  . Diverticulosis   . Graves' disease    2011  . HLD (hyperlipidemia)   . Hx of radiation therapy 08/13/13- 09/06/13   right breast 4250 cGy 17 sessions  . Hypertension   . IBS (irritable bowel syndrome)   . Kidney stones   . Osteopenia   . Personal history of radiation therapy  Family History  Problem Relation Age of Onset  . Heart disease Mother 28       MI  . Lung cancer Mother   . Hypertension Mother   . Alcohol abuse Father   . COPD Father   . Colon cancer Maternal Grandfather 39  . Diabetes Paternal Grandmother   . Breast cancer Maternal Aunt   . Pancreatic cancer Maternal Aunt   . Stomach cancer Neg Hx     Past Surgical History:  Procedure Laterality Date  . BREAST BIOPSY Right   . BREAST LUMPECTOMY      06/11/13  with lymph node biopsy  . BREAST LUMPECTOMY WITH NEEDLE LOCALIZATION AND AXILLARY SENTINEL LYMPH NODE BX Right 06/11/2013   Procedure: BREAST LUMPECTOMY WITH NEEDLE LOCALIZATION AND AXILLARY SENTINEL LYMPH NODE BX;  Surgeon: Harl Bowie, MD;  Location: [facility];  Service: General;  Laterality: Right;  Needle localization BCG 7:30   . COLONOSCOPY W/ BIOPSIES AND POLYPECTOMY  multiple  . KNEE ARTHROSCOPY Left   . KNEE ARTHROSCOPY     Social History   Occupational History  . Occupation: Programmer, multimedia: UNEMPLOYED  Tobacco Use  . Smoking status: Never Smoker  . Smokeless tobacco: Never Used  Substance and Sexual Activity  . Alcohol use: Yes    Alcohol/week: 0.0 oz    Comment: occasional  . Drug use: No  . Sexual activity: Yes    Comment: menarche 60, 76 1 age 14, menopause 87, HRT x 9 yrs     Garald Balding, MD   Note - This record has been created using [employer].  Chart creation errors have been sought, but may not always  have been located. Such creation errors do not reflect on  the standard of medical care.

## 2018-03-20 ENCOUNTER — Ambulatory Visit (INDEPENDENT_AMBULATORY_CARE_PROVIDER_SITE_OTHER): Payer: Medicare Other | Admitting: Physician Assistant

## 2018-03-20 ENCOUNTER — Encounter: Payer: Self-pay | Admitting: Physician Assistant

## 2018-03-20 ENCOUNTER — Other Ambulatory Visit: Payer: Self-pay | Admitting: Physician Assistant

## 2018-03-20 VITALS — BP 110/82 | HR 67 | Ht 65.5 in | Wt 137.6 lb

## 2018-03-20 DIAGNOSIS — E785 Hyperlipidemia, unspecified: Secondary | ICD-10-CM

## 2018-03-20 DIAGNOSIS — Z01810 Encounter for preprocedural cardiovascular examination: Secondary | ICD-10-CM

## 2018-03-20 DIAGNOSIS — I10 Essential (primary) hypertension: Secondary | ICD-10-CM

## 2018-03-20 DIAGNOSIS — R0609 Other forms of dyspnea: Secondary | ICD-10-CM

## 2018-03-20 DIAGNOSIS — Z8249 Family history of ischemic heart disease and other diseases of the circulatory system: Secondary | ICD-10-CM | POA: Diagnosis not present

## 2018-03-20 MED ORDER — METOPROLOL TARTRATE 25 MG PO TABS: ORAL_TABLET | ORAL | 0 refills | Status: DC

## 2018-03-20 NOTE — Progress Notes (Signed)
Cardiology Office Note   Date:  03/20/2018   ID:  [name], DOB 12/18/1945, MRN [id number]  PCP:  Hulan Fess, MD  Cardiologist:   Dr. Percival Spanish, 04/27/2016  Rosaria Ferries, PA-C    History of Present Illness: [name] is a 72 y.o. female with a history of mild MVP, Graves' dz 2011, palpitations, nl GXT and cardiac CT w/ mod Diag dz in [place] 2011, nl ETT here 2017  [name] presents for cardiology follow up.  She has bilateral knee OA and a bad back. She sees Dr French Ana and he has recommended knee replacements. She is thinking about scheduling the first one this fall. She is worried about cardiac complications from the surgery.  The MS issues limit her activity level but she is still able to walk a mile in 20 minutes. She does this most days, knees hurt too much to do more.  She is doing generally well. She does not get chest pain with exertion. She does not have LE edema, no orthopnea or PND. She does not have palpitations, no presyncope or syncope. She does have DOE, no recent change.  She is concerned about her family history because her mother died of an MI at age 66. A friend recently had an MI, sx were atypical. She is concerned that women have atypical sx and she may be getting ready to have a significant MI.   Her BP is always good. Her LDL is very low.    Past Medical History:  Diagnosis Date  . Adenomatous colon polyp 03/07/2012  . Allergic rhinitis   . Arthritis   . Breast cancer ([place]) 05/20/13   right  . Diverticulosis   . Graves' disease    2011  . HLD (hyperlipidemia)   . Hx of radiation therapy 08/13/13- 09/06/13   right breast 4250 cGy 17 sessions  . Hypertension   . IBS (irritable bowel syndrome)   . Kidney stones   . Osteopenia   . Personal history of radiation therapy     Past Surgical History:  Procedure Laterality Date  . BREAST BIOPSY Right   . BREAST LUMPECTOMY     06/11/13  with lymph node biopsy  . BREAST LUMPECTOMY  WITH NEEDLE LOCALIZATION AND AXILLARY SENTINEL LYMPH NODE BX Right 06/11/2013   Procedure: BREAST LUMPECTOMY WITH NEEDLE LOCALIZATION AND AXILLARY SENTINEL LYMPH NODE BX;  Surgeon: Harl Bowie, MD;  Location: [facility];  Service: General;  Laterality: Right;  Needle localization BCG 7:30   . COLONOSCOPY W/ BIOPSIES AND POLYPECTOMY  multiple  . KNEE ARTHROSCOPY Left   . KNEE ARTHROSCOPY      Current Outpatient Medications  Medication Sig Dispense Refill  . atorvastatin (LIPITOR) 10 MG tablet Take 10 mg by mouth every evening.     Marland Kitchen buPROPion (WELLBUTRIN XL) 300 MG 24 hr tablet Take 300 mg by mouth daily.    . Calcium Carb-Cholecalciferol (CALCIUM 1000 + D) 1000-800 MG-UNIT TABS Take 1 tablet by mouth daily.    . Echinacea 125 MG CAPS Take 125 mg by mouth as needed (Cold prophylaxis).     . fluticasone (FLONASE) 50 MCG/ACT nasal spray Place 2 sprays into the nose daily.    Marland Kitchen ibuprofen (ADVIL) 200 MG tablet Take 400 mg by mouth every 6 (six) hours as needed for pain.     Marland Kitchen lisinopril (PRINIVIL,ZESTRIL) 10 MG tablet Take 1 tablet (10 mg total) by mouth daily. NEED OV. 30 tablet 0  .  meloxicam (MOBIC) 15 MG tablet Take 15 mg by mouth daily.    . Multiple Vitamin (MULTIVITAMIN) tablet Take 1 tablet by mouth daily.    Marland Kitchen SYNTHROID 100 MCG tablet Take 100 mcg by mouth every other day.     Marland Kitchen SYNTHROID 88 MCG tablet Take 88 mcg by mouth every other day.     . tamoxifen (NOLVADEX) 20 MG tablet Take 1 tablet (20 mg total) by mouth daily. 90 tablet 3  . TURMERIC PO Take 2 capsules by mouth daily.    Marland Kitchen zolpidem (AMBIEN) 10 MG tablet Take 10 mg by mouth at bedtime as needed for sleep.     Current Facility-Administered Medications  Medication Dose Route Frequency Provider Last Rate Last Dose  . 0.9 %  sodium chloride infusion  500 mL Intravenous Continuous Gatha Mayer, MD        Allergies:   Amoxicillin; Terak [terramycin]; and Oxytetracycline    Social History:  The patient  reports that she  has never smoked. She has never used smokeless tobacco. She reports that she drinks alcohol. She reports that she does not use drugs.   Family History:  The patient's family history includes Alcohol abuse in her father; Breast cancer in her maternal aunt; COPD in her father; Colon cancer (age of onset: 35) in her maternal grandfather; Diabetes in her paternal grandmother; Heart disease (age of onset: 21) in her mother; Hypertension in her mother; Lung cancer in her mother; Pancreatic cancer in her maternal aunt.    ROS:  Please see the history of present illness. All other systems are reviewed and negative.    PHYSICAL EXAM: VS:  BP 110/82 (BP Location: Right Arm, Patient Position: Sitting, Cuff Size: Normal)   Pulse 67   Ht 5' 5.5" (1.664 m)   Wt 137 lb 9.6 oz (62.4 kg)   SpO2 97%   BMI 22.55 kg/m  , BMI Body mass index is 22.55 kg/m. GEN: Well nourished, well developed, female in no acute distress  HEENT: normal for age  Neck: no JVD, no carotid bruit, no masses Cardiac: RRR; soft murmur, no rubs, or gallops Respiratory: clear to auscultation bilaterally, normal work of breathing GI: soft, nontender, nondistended, + BS MS: no deformity or atrophy; no edema; distal pulses are 2+ in all 4 extremities   Skin: warm and dry, no rash Neuro:  Strength and sensation are intact Psych: euthymic mood, full affect   EKG:  EKG is ordered today. The ekg ordered today demonstrates sinus bradycardia, heart rate 59, sinus arrhythmia noted.  No significant change from 04/28/2016 when her heart rate was 53  ECHO: 05/26/2016 - Left ventricle: The cavity size was normal. Wall thickness was   increased in a pattern of mild LVH. Systolic function was normal.   The estimated ejection fraction was in the range of 60% to 65%.   Wall motion was normal; there were no regional wall motion   abnormalities. Doppler parameters are consistent with abnormal   left ventricular relaxation (grade 1 diastolic  dysfunction). The   E/e&' ratio is <8, suggesting normal LV filling pressure. - Mitral valve: Mildly thickened leaflets. There may be mild   posterior leaflet prolapse. There was mild regurgitation. - Left atrium: The atrium was normal in size. - Tricuspid valve: There was trivial regurgitation. - Pulmonary arteries: PA peak pressure: 16 mm Hg (S). - Inferior vena cava: The vessel was normal in size. The   respirophasic diameter changes were in the normal range (=  50%),   consistent with normal central venous pressure. Impressions: - LVEF 60-65%, mild LVH, normal wall motion, diastolic dysfunction,   normal LV filling pressure, possible mild posterior leaflet   prolapse with mild MR, normal LA Size, trivial TR, normal RVSP,   normal IVC.  ETT: 05/06/2016 Baseline ECG with LVH Normal ETT with no evidence of ischemia 92% PMHR and 11.9 METS   Recent Labs: No results found for requested labs within last 8760 hours.    Lipid Panel No results found for: CHOL, TRIG, HDL, CHOLHDL, VLDL, LDLCALC, LDLDIRECT   Wt Readings from Last 3 Encounters:  03/20/18 137 lb 9.6 oz (62.4 kg)  03/20/18 138 lb (62.6 kg)  03/12/18 138 lb (62.6 kg)     Other studies Reviewed: Additional studies/ records that were reviewed today include: Office notes and testing.  ASSESSMENT AND PLAN:  1.  Preoperative evaluation: She is worried about her cardiac status and the fact that she needs knee replacements.  Upon reading Dr. Romona Curls notes, she may also need a procedure on her back. -Her RCRI is 0.4%, the lowest reading. -Her activity limitations are based on musculoskeletal issues, not respiratory or cardiac. -She is walking a mile in 20 minutes several days a week. -She is at acceptable risk for a planned procedure on her knees or her back without additional cardiac testing.  2.  Family history of coronary artery disease: She has had friends and relatives that had heart attacks or died of heart attacks  at a young age.  She is now older than her mother was when she died.  She understands that women have atypical symptoms and is concerned.  She cannot do a treadmill test. -She prefers a definitive answer, but Medicare will not cover a cardiac CT in her case. - **Dr. Percival Spanish to review and advise if any further testing is needed** -I left a message on her mobile phone that I was not able to order the cardiac CT and so have not ordered any testing.  3.  Hyperlipidemia: Her LDL is consistently below 70, she works at eating a low-cholesterol diet and is compliant with Lipitor.  4.  Hypertension: Her blood pressure is well controlled today as it always is.   Current medicines are reviewed at length with the patient today.  The patient does not have concerns regarding medicines.  The following changes have been made:  no change  Labs/ tests ordered today include:  No orders of the defined types were placed in this encounter.    Disposition:   FU with Dr. Percival Spanish  Signed, Rosaria Ferries, PA-C  03/20/2018 10:17 AM    [facility] Phone: [phone]; Fax: [phone]  This note was written with the assistance of speech recognition software. Please excuse any transcriptional errors.

## 2018-03-20 NOTE — Patient Instructions (Addendum)
Schedule Cardiac CT    Please arrive at the [facility] main entrance of [facility] at  AM (30-45 minutes prior to test start time)  [facility] [place], [place] [address] [phone]  Proceed to the [facility] Radiology Department (First Floor).  Please follow these instructions carefully (unless otherwise directed):    On the Night Before the Test: . Drink plenty of water. . Do not consume any caffeinated/decaffeinated beverages or chocolate 12 hours prior to your test. . Do not take any antihistamines 12 hours prior to your test.   On the Day of the Test: . Drink plenty of water. Do not drink any water within one hour of the test. . Do not eat any food 4 hours prior to the test. . You may take your regular medications prior to the test. . IF NOT ON A BETA BLOCKER - Take 25 mg of lopressor (metoprolol) one hour before the test.  After the Test: . Drink plenty of water. . After receiving IV contrast, you may experience a mild flushed feeling. This is normal. . On occasion, you may experience a mild rash up to 24 hours after the test. This is not dangerous. If this occurs, you can take Benadryl 25 mg and increase your fluid intake. . If you experience trouble breathing, this can be serious. If it is severe call 911 IMMEDIATELY. If it is mild, please call our office. . If you take any of these medications: Glipizide/Metformin, Avandament, Glucavance, please do not take 48 hours after completing test.    Your physician recommends that you schedule a follow-up appointment with Dr.Hochrein or Rosaria Ferries PA after Cardiac CT

## 2018-03-26 ENCOUNTER — Encounter (INDEPENDENT_AMBULATORY_CARE_PROVIDER_SITE_OTHER): Payer: Self-pay | Admitting: Orthopaedic Surgery

## 2018-03-26 ENCOUNTER — Ambulatory Visit (INDEPENDENT_AMBULATORY_CARE_PROVIDER_SITE_OTHER): Payer: Medicare Other | Admitting: Orthopaedic Surgery

## 2018-03-26 VITALS — BP 94/72 | HR 64 | Resp 18 | Ht 65.5 in | Wt 136.0 lb

## 2018-03-26 DIAGNOSIS — M1712 Unilateral primary osteoarthritis, left knee: Secondary | ICD-10-CM

## 2018-03-26 DIAGNOSIS — M1711 Unilateral primary osteoarthritis, right knee: Secondary | ICD-10-CM | POA: Diagnosis not present

## 2018-03-26 DIAGNOSIS — M170 Bilateral primary osteoarthritis of knee: Secondary | ICD-10-CM

## 2018-03-26 MED ORDER — SODIUM HYALURONATE (VISCOSUP) 20 MG/2ML IX SOSY
20.0000 mg | PREFILLED_SYRINGE | INTRA_ARTICULAR | Status: AC | PRN
  Administered 2018-03-26: 20 mg via INTRA_ARTICULAR

## 2018-03-26 NOTE — Progress Notes (Signed)
Office Visit Note   Patient: [name]           Date of Birth: [birth year]           MRN: [id number] Visit Date: 03/26/2018              Requested by: Hulan Fess, MD [place], [place] [address] PCP: Hulan Fess, MD   Assessment & Plan: Visit Diagnoses:  1. Bilateral primary osteoarthritis of knee     Plan: Osteoarthritis both knees.  We will proceed with third Hyalgan injection today.  Office 1 week  Follow-Up Instructions: Return in about 1 week (around 04/02/2018).   Orders:  Orders Placed This Encounter  Procedures  . Large Joint Inj: L knee  . Large Joint Inj: R knee   No orders of the defined types were placed in this encounter.     Procedures: Large Joint Inj: L knee on 03/26/2018 10:04 AM Indications: pain and joint swelling Details: 25 G 1.5 in needle, anteromedial approach  Arthrogram: No  Medications: 20 mg Sodium Hyaluronate 20 MG/2ML Outcome: tolerated well, no immediate complications Procedure, treatment alternatives, risks and benefits explained, specific risks discussed. Consent was given by the patient. Immediately prior to procedure a time out was called to verify the correct patient, procedure, equipment, support staff and site/side marked as required. Patient was prepped and draped in the usual sterile fashion.   Large Joint Inj: R knee on 03/26/2018 10:05 AM Indications: pain and joint swelling Details: 25 G 1.5 in needle  Arthrogram: No  Medications: 20 mg Sodium Hyaluronate 20 MG/2ML Outcome: tolerated well, no immediate complications Procedure, treatment alternatives, risks and benefits explained, specific risks discussed. Consent was given by the patient. Immediately prior to procedure a time out was called to verify the correct patient, procedure, equipment, support staff and site/side marked as required. Patient was prepped and draped in the usual sterile fashion.       Clinical Data: No additional  findings.   Subjective: No chief complaint on file. Receiving Hyalgan injections for the osteoarthritis both knees.  No problems related to the first 2 injections  HPI  Review of Systems  Constitutional: Negative for fatigue and fever.  HENT: Negative for ear pain.   Eyes: Negative for pain.  Respiratory: Negative for cough and shortness of breath.   Cardiovascular: Negative for leg swelling.  Gastrointestinal: Positive for constipation. Negative for diarrhea.  Genitourinary: Negative for difficulty urinating.  Musculoskeletal: Positive for back pain. Negative for neck pain.  Skin: Negative for rash.  Allergic/Immunologic: Negative for food allergies.  Neurological: Negative for weakness and numbness.  Hematological: Bruises/bleeds easily.  Psychiatric/Behavioral: Negative for sleep disturbance.     Objective: Vital Signs: BP 94/72 (BP Location: Left Arm, Patient Position: Sitting, Cuff Size: Normal)   Pulse 64   Resp 18   Ht 5' 5.5" (1.664 m)   Wt 136 lb (61.7 kg)   BMI 22.29 kg/m   Physical Exam  Ortho Exam neither knee is hot red or swollen.  No significant pain.  No calf discomfort  Specialty Comments:  No specialty comments available.  Imaging: No results found.   PMFS History: Patient Active Problem List   Diagnosis Date Noted  . Bilateral primary osteoarthritis of knee 03/12/2018  . Essential hypertension 08/26/2016  . Mitral valve disorder 08/26/2016  . History of palpitations 08/26/2016  . Dyslipidemia 08/26/2016  . Internal hemorrhoids 08/03/2015  . Rectal bleeding 08/03/2015  . Hx of radiation therapy   .  Breast cancer of upper-outer quadrant of right female breast ([place]) 05/23/2013  . Personal history of colonic polyps 01/06/2012  . Irritable bowel syndrome - constipation predominant 01/06/2012   Past Medical History:  Diagnosis Date  . Adenomatous colon polyp 03/07/2012  . Allergic rhinitis   . Arthritis   . Breast cancer ([place]) 05/20/13    right  . Diverticulosis   . Graves' disease    2011  . HLD (hyperlipidemia)   . Hx of radiation therapy 08/13/13- 09/06/13   right breast 4250 cGy 17 sessions  . Hypertension   . IBS (irritable bowel syndrome)   . Kidney stones   . Osteopenia   . Personal history of radiation therapy     Family History  Problem Relation Age of Onset  . Heart disease Mother 42       MI  . Lung cancer Mother   . Hypertension Mother   . Alcohol abuse Father   . COPD Father   . Colon cancer Maternal Grandfather 72  . Diabetes Paternal Grandmother   . Breast cancer Maternal Aunt   . Pancreatic cancer Maternal Aunt   . Stomach cancer Neg Hx     Past Surgical History:  Procedure Laterality Date  . BREAST BIOPSY Right   . BREAST LUMPECTOMY     06/11/13  with lymph node biopsy  . BREAST LUMPECTOMY WITH NEEDLE LOCALIZATION AND AXILLARY SENTINEL LYMPH NODE BX Right 06/11/2013   Procedure: BREAST LUMPECTOMY WITH NEEDLE LOCALIZATION AND AXILLARY SENTINEL LYMPH NODE BX;  Surgeon: Harl Bowie, MD;  [facility];  Service: General;  Laterality: Right;  Needle localization BCG 7:30   . COLONOSCOPY W/ BIOPSIES AND POLYPECTOMY  multiple  . KNEE ARTHROSCOPY Left 2006  . KNEE ARTHROSCOPY     Social History   Occupational History  . Occupation: Programmer, multimedia: UNEMPLOYED  Tobacco Use  . Smoking status: Never Smoker  . Smokeless tobacco: Never Used  Substance and Sexual Activity  . Alcohol use: Yes    Alcohol/week: 0.0 oz    Comment: occasional  . Drug use: No  . Sexual activity: Yes    Comment: menarche 25, G 1 age 63, menopause 48, HRT x 9 yrs

## 2018-04-02 ENCOUNTER — Ambulatory Visit (INDEPENDENT_AMBULATORY_CARE_PROVIDER_SITE_OTHER): Payer: Medicare Other | Admitting: Orthopaedic Surgery

## 2018-04-02 ENCOUNTER — Encounter (INDEPENDENT_AMBULATORY_CARE_PROVIDER_SITE_OTHER): Payer: Self-pay | Admitting: Orthopaedic Surgery

## 2018-04-02 VITALS — BP 123/72 | HR 53 | Resp 18 | Ht 65.5 in | Wt 136.0 lb

## 2018-04-02 DIAGNOSIS — M1712 Unilateral primary osteoarthritis, left knee: Secondary | ICD-10-CM

## 2018-04-02 DIAGNOSIS — M170 Bilateral primary osteoarthritis of knee: Secondary | ICD-10-CM

## 2018-04-02 DIAGNOSIS — M1711 Unilateral primary osteoarthritis, right knee: Secondary | ICD-10-CM | POA: Diagnosis not present

## 2018-04-02 MED ORDER — SODIUM HYALURONATE (VISCOSUP) 20 MG/2ML IX SOSY
20.0000 mg | PREFILLED_SYRINGE | INTRA_ARTICULAR | Status: AC | PRN
  Administered 2018-04-02: 20 mg via INTRA_ARTICULAR

## 2018-04-02 NOTE — Progress Notes (Signed)
Office Visit Note   Patient: [name]           Date of Birth: May 13, 1946           MRN: [id number] Visit Date: 04/02/2018              Requested by: Hulan Fess, MD [place], [place] [address] PCP: Hulan Fess, MD   Assessment & Plan: Visit Diagnoses:  1. Bilateral primary osteoarthritis of knee     Plan: Fourth Hyalgan injection both knees today.  No problem with prior injections  Follow-Up Instructions: Return in about 1 week (around 04/09/2018).   Orders:  Orders Placed This Encounter  Procedures  . Large Joint Inj: L knee  . Large Joint Inj: R knee   No orders of the defined types were placed in this encounter.     Procedures: Large Joint Inj: L knee on 04/02/2018 9:46 AM Indications: pain and diagnostic evaluation Details: 25 G 1.5 in needle, anteromedial approach  Arthrogram: No  Medications: 20 mg Sodium Hyaluronate 20 MG/2ML Procedure, treatment alternatives, risks and benefits explained, specific risks discussed. Consent was given by the patient. Patient was prepped and draped in the usual sterile fashion.   Large Joint Inj: R knee on 04/02/2018 9:46 AM Indications: pain and diagnostic evaluation Details: 25 G 1.5 in needle, anteromedial approach  Arthrogram: No  Medications: 20 mg Sodium Hyaluronate 20 MG/2ML Procedure, treatment alternatives, risks and benefits explained, specific risks discussed. Consent was given by the patient. Immediately prior to procedure a time out was called to verify the correct patient, procedure, equipment, support staff and site/side marked as required. Patient was prepped and draped in the usual sterile fashion.       Clinical Data: No additional findings.   Subjective: Chief Complaint  Patient presents with  . Right Knee - Pain  . Left Knee - Pain  . Follow-up    4TH BIL LAT KNEE INJ.   No related problems referable to the 3 prior Hyalgan injections feeling better HPI  Review of  Systems  Constitutional: Negative for fatigue and fever.  Eyes: Negative for pain.  Respiratory: Negative for cough and shortness of breath.   Cardiovascular: Negative for leg swelling.  Gastrointestinal: Negative for constipation and diarrhea.  Genitourinary: Negative for difficulty urinating.  Musculoskeletal: Positive for back pain. Negative for neck pain.  Skin: Negative for rash.  Allergic/Immunologic: Negative for food allergies.  Neurological: Negative for weakness and numbness.  Hematological: Bruises/bleeds easily.  Psychiatric/Behavioral: Negative for sleep disturbance.     Objective: Vital Signs: BP 123/72 (BP Location: Left Arm, Patient Position: Sitting, Cuff Size: Normal)   Pulse (!) 53   Resp 18   Ht 5' 5.5" (1.664 m)   Wt 136 lb (61.7 kg)   BMI 22.29 kg/m   Physical Exam  Ortho Exam awake alert and oriented x3.  Comfortable sitting.  Walks without a limp.  No swelling or localized tenderness of either knee.  No increased heat. Specialty Comments:  No specialty comments available.  Imaging: No results found.   PMFS History: Patient Active Problem List   Diagnosis Date Noted  . Bilateral primary osteoarthritis of knee 03/12/2018  . Essential hypertension 08/26/2016  . Mitral valve disorder 08/26/2016  . History of palpitations 08/26/2016  . Dyslipidemia 08/26/2016  . Internal hemorrhoids 08/03/2015  . Rectal bleeding 08/03/2015  . Hx of radiation therapy   . Breast cancer of upper-outer quadrant of right female breast ([place]) 05/23/2013  .  Personal history of colonic polyps 01/06/2012  . Irritable bowel syndrome - constipation predominant 01/06/2012   Past Medical History:  Diagnosis Date  . Adenomatous colon polyp 03/07/2012  . Allergic rhinitis   . Arthritis   . Breast cancer ([place]) 05/20/13   right  . Diverticulosis   . Graves' disease    2011  . HLD (hyperlipidemia)   . Hx of radiation therapy 08/13/13- 09/06/13   right breast 4250 cGy 17  sessions  . Hypertension   . IBS (irritable bowel syndrome)   . Kidney stones   . Osteopenia   . Personal history of radiation therapy     Family History  Problem Relation Age of Onset  . Heart disease Mother 4       MI  . Lung cancer Mother   . Hypertension Mother   . Alcohol abuse Father   . COPD Father   . Colon cancer Maternal Grandfather 76  . Diabetes Paternal Grandmother   . Breast cancer Maternal Aunt   . Pancreatic cancer Maternal Aunt   . Stomach cancer Neg Hx     Past Surgical History:  Procedure Laterality Date  . BREAST BIOPSY Right   . BREAST LUMPECTOMY     06/11/13  with lymph node biopsy  . BREAST LUMPECTOMY WITH NEEDLE LOCALIZATION AND AXILLARY SENTINEL LYMPH NODE BX Right 06/11/2013   Procedure: BREAST LUMPECTOMY WITH NEEDLE LOCALIZATION AND AXILLARY SENTINEL LYMPH NODE BX;  Surgeon: Harl Bowie, MD;  Location: [facility];  Service: General;  Laterality: Right;  Needle localization BCG 7:30   . COLONOSCOPY W/ BIOPSIES AND POLYPECTOMY  multiple  . KNEE ARTHROSCOPY Left 2006  . KNEE ARTHROSCOPY     Social History   Occupational History  . Occupation: Programmer, multimedia: UNEMPLOYED  Tobacco Use  . Smoking status: Never Smoker  . Smokeless tobacco: Current User    Types: Chew  Substance and Sexual Activity  . Alcohol use: Yes    Alcohol/week: 0.0 oz    Comment: occasional  . Drug use: No  . Sexual activity: Yes    Comment: menarche 80, G 1 age 47, menopause 17, HRT x 9 yrs

## 2018-04-09 ENCOUNTER — Encounter (INDEPENDENT_AMBULATORY_CARE_PROVIDER_SITE_OTHER): Payer: Self-pay | Admitting: Orthopaedic Surgery

## 2018-04-09 ENCOUNTER — Ambulatory Visit (INDEPENDENT_AMBULATORY_CARE_PROVIDER_SITE_OTHER): Payer: Medicare Other | Admitting: Orthopaedic Surgery

## 2018-04-09 VITALS — BP 112/76 | HR 55 | Resp 16 | Ht 65.5 in | Wt 134.5 lb

## 2018-04-09 DIAGNOSIS — M170 Bilateral primary osteoarthritis of knee: Secondary | ICD-10-CM

## 2018-04-09 MED ORDER — SODIUM HYALURONATE (VISCOSUP) 20 MG/2ML IX SOSY
20.0000 mg | PREFILLED_SYRINGE | INTRA_ARTICULAR | Status: AC | PRN
  Administered 2018-04-09: 20 mg via INTRA_ARTICULAR

## 2018-04-09 NOTE — Progress Notes (Signed)
Office Visit Note   Patient: [name]           Date of Birth: 12/16/1945           MRN: [id number] Visit Date: 04/09/2018              Requested by: Hulan Fess, MD [place], [place] [address] PCP: Hulan Fess, MD   Assessment & Plan: Visit Diagnoses:  1. Bilateral primary osteoarthritis of knee     Plan: Fifth and final Hyalgan injection both knees.  Has had some relief.  We will plan to see back as needed  Follow-Up Instructions: Return if symptoms worsen or fail to improve.   Orders:  Orders Placed This Encounter  Procedures  . Large Joint Inj: L knee  . Large Joint Inj: R knee   No orders of the defined types were placed in this encounter.     Procedures: Large Joint Inj: L knee on 04/09/2018 9:46 AM Indications: pain and diagnostic evaluation Details: 25 G 1.5 in needle, anteromedial approach  Arthrogram: No  Medications: 20 mg Sodium Hyaluronate 20 MG/2ML Procedure, treatment alternatives, risks and benefits explained, specific risks discussed. Consent was given by the patient. Patient was prepped and draped in the usual sterile fashion.   Large Joint Inj: R knee on 04/09/2018 9:47 AM Indications: pain and diagnostic evaluation Details: 25 G 1.5 in needle, anteromedial approach  Arthrogram: No  Medications: 20 mg Sodium Hyaluronate 20 MG/2ML Procedure, treatment alternatives, risks and benefits explained, specific risks discussed. Consent was given by the patient. Immediately prior to procedure a time out was called to verify the correct patient, procedure, equipment, support staff and site/side marked as required. Patient was prepped and draped in the usual sterile fashion.       Clinical Data: No additional findings.   Subjective: Chief Complaint  Patient presents with  . Follow-up    # 5 BIL LAT KNEE INJECTION  No problem with any of the injections so far  HPI  Review of Systems  Constitutional: Negative for  fatigue and fever.  HENT: Negative for ear pain.   Eyes: Negative for pain.  Respiratory: Negative for cough and shortness of breath.   Cardiovascular: Negative for leg swelling.  Gastrointestinal: Negative for constipation and diarrhea.  Genitourinary: Negative for difficulty urinating.  Musculoskeletal: Positive for back pain and neck pain.  Skin: Negative for rash.  Allergic/Immunologic: Negative for food allergies.  Neurological: Negative for weakness and numbness.  Hematological: Bruises/bleeds easily.  Psychiatric/Behavioral: Negative for sleep disturbance.     Objective: Vital Signs: BP 112/76 (BP Location: Left Arm, Patient Position: Sitting, Cuff Size: Normal)   Pulse (!) 55   Resp 16   Ht 5' 5.5" (1.664 m)   Wt 134 lb 8 oz (61 kg)   BMI 22.04 kg/m   Physical Exam  Ortho Exam both knees without effusion.  No increased heat or warmth.  Walks without a limp  Specialty Comments:  No specialty comments available.  Imaging: No results found.   PMFS History: Patient Active Problem List   Diagnosis Date Noted  . Bilateral primary osteoarthritis of knee 03/12/2018  . Essential hypertension 08/26/2016  . Mitral valve disorder 08/26/2016  . History of palpitations 08/26/2016  . Dyslipidemia 08/26/2016  . Internal hemorrhoids 08/03/2015  . Rectal bleeding 08/03/2015  . Hx of radiation therapy   . Breast cancer of upper-outer quadrant of right female breast ([place]) 05/23/2013  . Personal history of colonic  polyps 01/06/2012  . Irritable bowel syndrome - constipation predominant 01/06/2012   Past Medical History:  Diagnosis Date  . Adenomatous colon polyp 03/07/2012  . Allergic rhinitis   . Arthritis   . Breast cancer ([place]) 05/20/13   right  . Diverticulosis   . Graves' disease    2011  . HLD (hyperlipidemia)   . Hx of radiation therapy 08/13/13- 09/06/13   right breast 4250 cGy 17 sessions  . Hypertension   . IBS (irritable bowel syndrome)   . Kidney stones    . Osteopenia   . Personal history of radiation therapy     Family History  Problem Relation Age of Onset  . Heart disease Mother 55       MI  . Lung cancer Mother   . Hypertension Mother   . Alcohol abuse Father   . COPD Father   . Colon cancer Maternal Grandfather 66  . Diabetes Paternal Grandmother   . Breast cancer Maternal Aunt   . Pancreatic cancer Maternal Aunt   . Stomach cancer Neg Hx     Past Surgical History:  Procedure Laterality Date  . BREAST BIOPSY Right   . BREAST LUMPECTOMY     06/11/13  with lymph node biopsy  . BREAST LUMPECTOMY WITH NEEDLE LOCALIZATION AND AXILLARY SENTINEL LYMPH NODE BX Right 06/11/2013   Procedure: BREAST LUMPECTOMY WITH NEEDLE LOCALIZATION AND AXILLARY SENTINEL LYMPH NODE BX;  Surgeon: Harl Bowie, MD;  Location: [facility];  Service: General;  Laterality: Right;  Needle localization BCG 7:30   . COLONOSCOPY W/ BIOPSIES AND POLYPECTOMY  multiple  . KNEE ARTHROSCOPY Left 2006  . KNEE ARTHROSCOPY     Social History   Occupational History  . Occupation: Programmer, multimedia: UNEMPLOYED  Tobacco Use  . Smoking status: Never Smoker  . Smokeless tobacco: Never Used  Substance and Sexual Activity  . Alcohol use: Yes    Alcohol/week: 0.0 oz    Comment: occasional  . Drug use: No  . Sexual activity: Yes    Comment: menarche 60, G 1 age 67, menopause 81, HRT x 9 yrs

## 2018-04-19 DIAGNOSIS — Z124 Encounter for screening for malignant neoplasm of cervix: Secondary | ICD-10-CM | POA: Diagnosis not present

## 2018-04-19 DIAGNOSIS — Z87442 Personal history of urinary calculi: Secondary | ICD-10-CM | POA: Diagnosis not present

## 2018-04-19 DIAGNOSIS — Z853 Personal history of malignant neoplasm of breast: Secondary | ICD-10-CM | POA: Diagnosis not present

## 2018-04-19 DIAGNOSIS — E7800 Pure hypercholesterolemia, unspecified: Secondary | ICD-10-CM | POA: Diagnosis not present

## 2018-04-19 DIAGNOSIS — Z01419 Encounter for gynecological examination (general) (routine) without abnormal findings: Secondary | ICD-10-CM | POA: Diagnosis not present

## 2018-04-19 DIAGNOSIS — E039 Hypothyroidism, unspecified: Secondary | ICD-10-CM | POA: Diagnosis not present

## 2018-04-19 DIAGNOSIS — M170 Bilateral primary osteoarthritis of knee: Secondary | ICD-10-CM | POA: Diagnosis not present

## 2018-04-19 DIAGNOSIS — F338 Other recurrent depressive disorders: Secondary | ICD-10-CM | POA: Diagnosis not present

## 2018-04-19 DIAGNOSIS — I10 Essential (primary) hypertension: Secondary | ICD-10-CM | POA: Diagnosis not present

## 2018-04-19 DIAGNOSIS — R899 Unspecified abnormal finding in specimens from other organs, systems and tissues: Secondary | ICD-10-CM | POA: Diagnosis not present

## 2018-06-04 ENCOUNTER — Encounter: Payer: Self-pay | Admitting: Hematology and Oncology

## 2018-06-25 ENCOUNTER — Ambulatory Visit
Admission: RE | Admit: 2018-06-25 | Discharge: 2018-06-25 | Disposition: A | Discharge status: Home / Self Care | Payer: Medicare Other | Source: Ambulatory Visit | Attending: Hematology and Oncology | Admitting: Hematology and Oncology

## 2018-06-25 DIAGNOSIS — Z1231 Encounter for screening mammogram for malignant neoplasm of breast: Secondary | ICD-10-CM

## 2018-06-25 DIAGNOSIS — Z853 Personal history of malignant neoplasm of breast: Secondary | ICD-10-CM | POA: Diagnosis not present

## 2018-06-25 MED ORDER — GADOBENATE DIMEGLUMINE 529 MG/ML IV SOLN
12.0000 mL | Freq: Once | INTRAVENOUS | Status: AC | PRN
  Administered 2018-06-25: 12 mL via INTRAVENOUS

## 2018-06-26 ENCOUNTER — Telehealth: Payer: Self-pay | Admitting: Hematology and Oncology

## 2018-06-26 NOTE — Telephone Encounter (Signed)
I called the patient to inform her that her breast MRIs were normal. However there was no ability to leave a message because her voicemail has not been set up.

## 2018-07-16 ENCOUNTER — Ambulatory Visit (INDEPENDENT_AMBULATORY_CARE_PROVIDER_SITE_OTHER): Payer: Medicare Other | Admitting: Orthopaedic Surgery

## 2018-07-16 ENCOUNTER — Encounter (INDEPENDENT_AMBULATORY_CARE_PROVIDER_SITE_OTHER): Payer: Self-pay | Admitting: Orthopaedic Surgery

## 2018-07-16 VITALS — BP 111/75 | HR 64 | Resp 14 | Ht 65.5 in | Wt 133.0 lb

## 2018-07-16 DIAGNOSIS — M170 Bilateral primary osteoarthritis of knee: Secondary | ICD-10-CM

## 2018-07-16 NOTE — Progress Notes (Signed)
Office Visit Note   Patient: [name]           Date of Birth: 10-21-1946           MRN: [id number] Visit Date: 07/16/2018              Requested by: Michelle Fess, MD [place], [place] [address] PCP: Michelle Fess, MD   Assessment & Plan: Visit Diagnoses:  1. Bilateral primary osteoarthritis of knee     Plan: End-stage osteoarthritis right knee.  [name] has failed conservative treatment with cortisone and Visco supplementation as well as her meds.  She would like to proceed with a right knee replacement.  Long discussion regarding the surgery potential complications.  She like to proceed.  We will give her a clearance form and schedule surgery sometime in late September or October.  Follow-Up Instructions: Return will schedule right TKR.   Orders:  No orders of the defined types were placed in this encounter.  No orders of the defined types were placed in this encounter.     Procedures: No procedures performed   Clinical Data: No additional findings.   Subjective: Chief Complaint  Patient presents with  . Right Knee - Pain  . Knee Pain    Discuss right knee surgery  [name] is 72 years old and has been followed over a long period of time i.e. many years for the osteoarthritis of both knees.  She recently completed a course of Visco supplementation.  She is doing relatively well on the left still having a lot of pain on the right.  She has decided she would like to proceed with a knee replacement.  Does have some chronic problems with her back and recently had a course of prednisone.  History of breast cancer. Films of the right knee in April demonstrated significant degenerative changes particularly the medial compartment and the patellofemoral joint.  Finished a course of Visco supplementation in  May.  HPI  Review of Systems  Constitutional: Positive for activity change.  HENT: Negative for trouble swallowing.   Eyes: Negative for  pain.  Respiratory: Negative for shortness of breath.   Cardiovascular: Negative for leg swelling.  Gastrointestinal: Positive for constipation.  Endocrine: Negative for cold intolerance.  Genitourinary: Negative for difficulty urinating.  Musculoskeletal: Negative for joint swelling.  Skin: Negative for rash.  Allergic/Immunologic: Negative for food allergies.  Neurological: Negative for numbness.  Hematological: Bruises/bleeds easily.  Psychiatric/Behavioral: Negative for sleep disturbance.     Objective: Vital Signs: BP 111/75 (BP Location: Left Arm, Patient Position: Sitting, Cuff Size: Normal)   Pulse 64   Resp 14   Ht 5' 5.5" (1.664 m)   Wt 133 lb (60.3 kg)   BMI 21.80 kg/m   Physical Exam  Constitutional: She is oriented to person, place, and time. She appears well-developed and well-nourished.  HENT:  Mouth/Throat: Oropharynx is clear and moist.  Eyes: Pupils are equal, round, and reactive to light. EOM are normal.  Pulmonary/Chest: Effort normal.  Neurological: She is alert and oriented to person, place, and time.  Skin: Skin is warm and dry.  Psychiatric: She has a normal mood and affect. Her behavior is normal.    Ortho Exam right knee with mild to moderate medial joint pain and mild lateral joint pain.  Mild patellar crepitation.  No effusion.  Full extension.  Flexed over 105 degrees without instability no popliteal pain.  No calf discomfort.  No distal edema.  Walks without a limp.  Specialty Comments:  No specialty comments available.  Imaging: No results found.   PMFS History: Patient Active Problem List   Diagnosis Date Noted  . Bilateral primary osteoarthritis of knee 03/12/2018  . Essential hypertension 08/26/2016  . Mitral valve disorder 08/26/2016  . History of palpitations 08/26/2016  . Dyslipidemia 08/26/2016  . Internal hemorrhoids 08/03/2015  . Rectal bleeding 08/03/2015  . Hx of radiation therapy   . Breast cancer of upper-outer  quadrant of right female breast ([place]) 05/23/2013  . Personal history of colonic polyps 01/06/2012  . Irritable bowel syndrome - constipation predominant 01/06/2012   Past Medical History:  Diagnosis Date  . Adenomatous colon polyp 03/07/2012  . Allergic rhinitis   . Arthritis   . Breast cancer ([place]) 05/20/13   right  . Diverticulosis   . Graves' disease    2011  . HLD (hyperlipidemia)   . Hx of radiation therapy 08/13/13- 09/06/13   right breast 4250 cGy 17 sessions  . Hypertension   . IBS (irritable bowel syndrome)   . Kidney stones   . Lumbar herniated disc    L4-L5  . Osteopenia   . Personal history of radiation therapy     Family History  Problem Relation Age of Onset  . Heart disease Mother 40       MI  . Lung cancer Mother   . Hypertension Mother   . Alcohol abuse Father   . COPD Father   . Colon cancer Maternal Grandfather 38  . Diabetes Paternal Grandmother   . Breast cancer Maternal Aunt   . Pancreatic cancer Maternal Aunt   . Stomach cancer Neg Hx     Past Surgical History:  Procedure Laterality Date  . BREAST BIOPSY Right   . BREAST LUMPECTOMY     06/11/13  with lymph node biopsy  . BREAST LUMPECTOMY WITH NEEDLE LOCALIZATION AND AXILLARY SENTINEL LYMPH NODE BX Right 06/11/2013   Procedure: BREAST LUMPECTOMY WITH NEEDLE LOCALIZATION AND AXILLARY SENTINEL LYMPH NODE BX;  Surgeon: Harl Bowie, MD;  Location: [facility];  Service: General;  Laterality: Right;  Needle localization BCG 7:30   . COLONOSCOPY W/ BIOPSIES AND POLYPECTOMY  multiple  . KNEE ARTHROSCOPY Left 2006  . KNEE ARTHROSCOPY     Social History   Occupational History  . Occupation: Programmer, multimedia: UNEMPLOYED  Tobacco Use  . Smoking status: Never Smoker  . Smokeless tobacco: Never Used  Substance and Sexual Activity  . Alcohol use: Yes    Alcohol/week: 0.0 standard drinks    Comment: occasional  . Drug use: No  . Sexual activity: Yes    Comment: menarche 8, G 1 age 26, menopause  89, HRT x 9 yrs

## 2018-07-20 DIAGNOSIS — I10 Essential (primary) hypertension: Secondary | ICD-10-CM | POA: Diagnosis not present

## 2018-07-20 DIAGNOSIS — M25561 Pain in right knee: Secondary | ICD-10-CM | POA: Diagnosis not present

## 2018-07-20 DIAGNOSIS — N289 Disorder of kidney and ureter, unspecified: Secondary | ICD-10-CM | POA: Diagnosis not present

## 2018-08-03 DIAGNOSIS — K1379 Other lesions of oral mucosa: Secondary | ICD-10-CM | POA: Diagnosis not present

## 2018-08-10 ENCOUNTER — Telehealth: Payer: Self-pay | Admitting: Hematology and Oncology

## 2018-08-10 NOTE — Telephone Encounter (Signed)
Returned patient's call.  Rescheduled appt to 11/13 per patient's request, as she is having surgery on 10/8.

## 2018-08-17 ENCOUNTER — Encounter (HOSPITAL_COMMUNITY): Payer: Self-pay

## 2018-08-17 ENCOUNTER — Encounter (HOSPITAL_COMMUNITY)
Admission: RE | Admit: 2018-08-17 | Discharge: 2018-08-17 | Disposition: A | Discharge status: Home / Self Care | Payer: Medicare Other | Source: Ambulatory Visit | Attending: Orthopaedic Surgery | Admitting: Orthopaedic Surgery

## 2018-08-17 ENCOUNTER — Ambulatory Visit (HOSPITAL_COMMUNITY)
Admission: RE | Admit: 2018-08-17 | Discharge: 2018-08-17 | Disposition: A | Discharge status: Home / Self Care | Payer: Medicare Other | Source: Ambulatory Visit | Attending: Orthopedic Surgery | Admitting: Orthopedic Surgery

## 2018-08-17 ENCOUNTER — Other Ambulatory Visit: Payer: Self-pay

## 2018-08-17 DIAGNOSIS — Z01818 Encounter for other preprocedural examination: Secondary | ICD-10-CM | POA: Diagnosis not present

## 2018-08-17 DIAGNOSIS — Z96659 Presence of unspecified artificial knee joint: Secondary | ICD-10-CM | POA: Diagnosis not present

## 2018-08-17 HISTORY — DX: Other specified postprocedural states: Z98.890

## 2018-08-17 HISTORY — DX: Personal history of urinary calculi: Z87.442

## 2018-08-17 HISTORY — DX: Ventricular premature depolarization: I49.3

## 2018-08-17 HISTORY — DX: Nausea with vomiting, unspecified: R11.2

## 2018-08-17 LAB — URINALYSIS, ROUTINE W REFLEX MICROSCOPIC
Bilirubin Urine: NEGATIVE
GLUCOSE, UA: NEGATIVE mg/dL
Ketones, ur: 5 mg/dL — AB
Nitrite: NEGATIVE
PH: 6 (ref 5.0–8.0)
PROTEIN: 100 mg/dL — AB
RBC / HPF: >50 RBC/hpf — ABNORMAL HIGH (ref 0–5)
Specific Gravity, Urine: 1.018 (ref 1.005–1.030)

## 2018-08-17 LAB — COMPREHENSIVE METABOLIC PANEL
ALBUMIN: 4 g/dL (ref 3.5–5.0)
ALK PHOS: 42 U/L (ref 38–126)
ALT: 20 U/L (ref 0–44)
ANION GAP: 8 (ref 5–15)
AST: 30 U/L (ref 15–41)
BUN: 15 mg/dL (ref 8–23)
CHLORIDE: 108 mmol/L (ref 98–111)
CO2: 23 mmol/L (ref 22–32)
Calcium: 9.3 mg/dL (ref 8.9–10.3)
Creatinine, Ser: 0.84 mg/dL (ref 0.44–1.00)
GFR calc Af Amer: >60 mL/min (ref >60)
GFR calc non Af Amer: >60 mL/min (ref >60)
GLUCOSE: 75 mg/dL (ref 70–99)
POTASSIUM: 4 mmol/L (ref 3.5–5.1)
SODIUM: 139 mmol/L (ref 135–145)
Total Bilirubin: 0.7 mg/dL (ref 0.3–1.2)
Total Protein: 6.3 g/dL — ABNORMAL LOW (ref 6.5–8.1)

## 2018-08-17 LAB — CBC WITH DIFFERENTIAL/PLATELET
Abs Immature Granulocytes: 0 10*3/uL (ref 0.0–0.1)
BASOS ABS: 0.1 10*3/uL (ref 0.0–0.1)
Basophils Relative: 2 %
EOS PCT: 4 %
Eosinophils Absolute: 0.2 10*3/uL (ref 0.0–0.7)
HCT: 45.4 % (ref 36.0–46.0)
HEMOGLOBIN: 14.6 g/dL (ref 12.0–15.0)
Immature Granulocytes: 0 %
LYMPHS PCT: 26 %
Lymphs Abs: 1.3 10*3/uL (ref 0.7–4.0)
MCH: 33.3 pg (ref 26.0–34.0)
MCHC: 32.2 g/dL (ref 30.0–36.0)
MCV: 103.7 fL — ABNORMAL HIGH (ref 78.0–100.0)
Monocytes Absolute: 0.5 10*3/uL (ref 0.1–1.0)
Monocytes Relative: 10 %
Neutro Abs: 2.8 10*3/uL (ref 1.7–7.7)
Neutrophils Relative %: 58 %
Platelets: 205 10*3/uL (ref 150–400)
RBC: 4.38 MIL/uL (ref 3.87–5.11)
RDW: 12.1 % (ref 11.5–15.5)
WBC: 4.8 10*3/uL (ref 4.0–10.5)

## 2018-08-17 LAB — APTT: APTT: 26 s (ref 24–36)

## 2018-08-17 LAB — TYPE AND SCREEN
ABO/RH(D): O POS
ANTIBODY SCREEN: NEGATIVE

## 2018-08-17 LAB — SURGICAL PCR SCREEN
MRSA, PCR: NEGATIVE
Staphylococcus aureus: POSITIVE — AB

## 2018-08-17 LAB — PROTIME-INR
INR: 1.05
Prothrombin Time: 13.6 seconds (ref 11.4–15.2)

## 2018-08-17 LAB — ABO/RH: ABO/RH(D): O POS

## 2018-08-17 NOTE — Pre-Procedure Instructions (Signed)
[name]  08/17/2018      [place], [place] [address] Phone: [phone] Fax: [phone]  [place], [place] [address] [place] [address] Phone: [phone] Fax: [phone]  [facility] [place], [place] [address] [place] [address] Phone: [phone] Fax: [phone]    Your procedure is scheduled on 08-28-2018 Tuesday   Report to [facility] Admitting at 5:30 A.M.   Call this number if you have problems the morning of surgery:  [phone]   Remember:  Do not eat food or  Drink liquids after midnight. .                          Take these medicines the morning of surgery with A SIP OF WATER   Bupropion(Wellbutrin) Flonase nasal spray Synthroid Tamoxifen(Nolvadex)   STOP TAKING ANY ASPIRIN (UNLESS OTHERWISE INSTRUCTED BY YOUR SURGEON),ANTIINFLAMATORIES (IBUPROFEN,ALEVE,MOTRIN,ADVIL,GOODY'S POWDERS),HERBAL SUPPLEMENTS,FISH OIL,AND VITAMINS 5-7 DAYS PRIOR TO SURGERY     Do not wear jewelry, make-up or nail polish.  Do not wear lotions, powders, or perfumes, or deodorant.  Do not shave 48 hours prior to surgery.  Men may shave face and neck.  Do not bring valuables to the hospital.  [facility] is not responsible for any belongings or valuables.  Contacts, dentures or bridgework may not be worn into surgery.  Leave your suitcase in the car.  After surgery it may be brought to your room.  For patients admitted to the hospital, discharge time will be determined by your treatment team.  Patients discharged the day of surgery will not be allowed to drive home.     [facility] - Preparing for Surgery  Before surgery, you can play an important role.  Because skin is not sterile, your skin needs to be as free of  germs as possible.  You can reduce the number of germs on you skin by washing with CHG (chlorahexidine gluconate) soap before surgery.  CHG is an antiseptic cleaner which kills germs and bonds with the skin to continue killing germs even after washing.  Oral Hygiene is also important in reducing the risk of infection.  Remember to brush your teeth with your regular toothpaste the morning of surgery.  Please DO NOT use if you have an allergy to CHG or antibacterial soaps.  If your skin becomes reddened/irritated stop using the CHG and inform your nurse when you arrive at Short Stay.  Do not shave (including legs and underarms) for at least 48 hours prior to the first CHG shower.  You may shave your face.  Please follow these instructions carefully:   1.  Shower with CHG Soap the night before surgery and the morning of Surgery.  2.  If you choose to wash your hair, wash your hair first as usual with your normal shampoo.  3.  After you shampoo, rinse your hair and body thoroughly to remove the shampoo. 4.  Use CHG as you would any other liquid soap.  You can apply chg directly to the skin and wash gently with a      scrungie or washcloth.           5.  Apply the CHG Soap to your body ONLY FROM THE  NECK DOWN.   Do not use on open wounds or open sores. Avoid contact with your eyes, ears, mouth and genitals (private parts).  Wash genitals (private parts) with your normal soap.  6.  Wash thoroughly, paying special attention to the area where your surgery will be performed.  7.  Thoroughly rinse your body with warm water from the neck down.  8.  DO NOT shower/wash with your normal soap after using and rinsing off the CHG Soap.  9.  Pat yourself dry with a clean towel.            10.  Wear clean pajamas.            11.  Place clean sheets on your bed the night of your first shower and do not sleep with pets.  Day of Surgery  Do not apply any lotions/deoderants the morning of surgery.   Please wear  clean clothes to the hospital/surgery center. Remember to brush your teeth with toothpaste.    Please read over the following fact sheets that you were given. Pain Booklet, Coughing and Deep Breathing and Surgical Site Infection Prevention

## 2018-08-17 NOTE — Progress Notes (Addendum)
PCP  Hulan Fess MD  Cardiology office visit in 02-2018  Epic     ECHO  05-26-2018  Stress 05-06-2018  Pt. Notified of results of PCR swab,positive for staph.  Prescription called to [facility].  IB message sent to Dr. Durward Fortes regarding abnormal urine.

## 2018-08-18 LAB — URINE CULTURE: CULTURE: NO GROWTH

## 2018-08-22 ENCOUNTER — Ambulatory Visit (INDEPENDENT_AMBULATORY_CARE_PROVIDER_SITE_OTHER): Payer: Medicare Other | Admitting: Orthopedic Surgery

## 2018-08-22 ENCOUNTER — Encounter (INDEPENDENT_AMBULATORY_CARE_PROVIDER_SITE_OTHER): Payer: Self-pay | Admitting: Orthopedic Surgery

## 2018-08-22 VITALS — BP 119/74 | HR 49 | Temp 97.8°F | Resp 14 | Ht 65.25 in | Wt 131.4 lb

## 2018-08-22 DIAGNOSIS — M1711 Unilateral primary osteoarthritis, right knee: Secondary | ICD-10-CM

## 2018-08-22 NOTE — H&P (Signed)
TOTAL KNEE ADMISSION H&P  Patient is being admitted for right total knee arthroplasty.  Subjective:  Chief Complaint:right knee pain.  HPI: [name], 72 y.o. female, has a history of pain and functional disability in the right knee due to arthritis and has failed non-surgical conservative treatments for greater than 12 weeks to includeNSAID's and/or analgesics, corticosteriod injections, viscosupplementation injections, flexibility and strengthening excercises and activity modification.  Onset of symptoms was gradual, starting 10 years ago with gradually worsening course since that time. The patient noted no past surgery on the right knee(s).  Patient currently rates pain in the right knee(s) at 8 out of 10 with activity. Patient has night pain, worsening of pain with activity and weight bearing, pain that interferes with activities of daily living and crepitus.  Patient has evidence of subchondral sclerosis, periarticular osteophytes, joint subluxation and joint space narrowing by imaging studies. There is no active infection.  Patient Active Problem List   Diagnosis Date Noted  . Bilateral primary osteoarthritis of knee 03/12/2018  . Essential hypertension 08/26/2016  . Mitral valve disorder 08/26/2016  . History of palpitations 08/26/2016  . Dyslipidemia 08/26/2016  . Internal hemorrhoids 08/03/2015  . Rectal bleeding 08/03/2015  . Hx of radiation therapy   . Breast cancer of upper-outer quadrant of right female breast ([place]) 05/23/2013  . Personal history of colonic polyps 01/06/2012  . Irritable bowel syndrome - constipation predominant 01/06/2012   Past Medical History:  Diagnosis Date  . Adenomatous colon polyp 03/07/2012  . Allergic rhinitis   . Arthritis   . Breast cancer ([place]) 05/20/13   right  . Diverticulosis   . Graves' disease    2011  . History of kidney stones   . HLD (hyperlipidemia)   . Hx of radiation therapy 08/13/13- 09/06/13   right breast 4250 cGy 17  sessions  . Hypertension   . IBS (irritable bowel syndrome)   . Kidney stones   . Lumbar herniated disc    L4-L5  . Osteopenia   . Personal history of radiation therapy   . PONV (postoperative nausea and vomiting)   . PVC (premature ventricular contraction)    Takes metoprolol PRN for  pvc's    Past Surgical History:  Procedure Laterality Date  . BREAST BIOPSY Right   . BREAST LUMPECTOMY     06/11/13  with lymph node biopsy  . BREAST LUMPECTOMY WITH NEEDLE LOCALIZATION AND AXILLARY SENTINEL LYMPH NODE BX Right 06/11/2013   Procedure: BREAST LUMPECTOMY WITH NEEDLE LOCALIZATION AND AXILLARY SENTINEL LYMPH NODE BX;  Surgeon: Harl Bowie, MD;  Location: [facility];  Service: General;  Laterality: Right;  Needle localization BCG 7:30   . COLONOSCOPY W/ BIOPSIES AND POLYPECTOMY  multiple  . KNEE ARTHROSCOPY Left 2006  . KNEE ARTHROSCOPY      Current Facility-Administered Medications  Medication Dose Route Frequency Provider Last Rate Last Dose  . 0.9 %  sodium chloride infusion  500 mL Intravenous Continuous Gatha Mayer, MD       Current Outpatient Medications  Medication Sig Dispense Refill Last Dose  . atorvastatin (LIPITOR) 10 MG tablet Take 10 mg by mouth every evening.    Taking  . buPROPion (WELLBUTRIN XL) 300 MG 24 hr tablet Take 300 mg by mouth daily.   Taking  . Calcium Carb-Cholecalciferol (CALCIUM 1000 + D) 1000-800 MG-UNIT TABS Take 1 tablet by mouth daily.   Taking  . Echinacea 350 MG CAPS Take 350 mg by mouth as needed (Cold prophylaxis).  Taking  . fluticasone (FLONASE) 50 MCG/ACT nasal spray Place 2 sprays into the nose daily.    Taking  . ibuprofen (ADVIL) 200 MG tablet Take 400 mg by mouth every 6 (six) hours as needed for pain.    Taking  . lisinopril (PRINIVIL,ZESTRIL) 10 MG tablet Take 1 tablet (10 mg total) by mouth daily. NEED OV. (Patient taking differently: Take 10 mg by mouth every other day. NEED OV.) 30 tablet 0 Taking  . meloxicam (MOBIC) 15 MG tablet  Take 15 mg by mouth daily.   Taking  . metoprolol tartrate (LOPRESSOR) 25 MG tablet Take 25 mg 1 hour before Cardiac CT (Patient taking differently: Take 25 mg by mouth as needed (for PVC). ) 1 tablet 0 Taking  . Multiple Vitamin (MULTIVITAMIN) tablet Take 1 tablet by mouth daily.   Taking  . SYNTHROID 100 MCG tablet Take 100 mcg by mouth every other day.    Taking  . SYNTHROID 88 MCG tablet Take 88 mcg by mouth every other day.    Taking  . tamoxifen (NOLVADEX) 20 MG tablet Take 1 tablet (20 mg total) by mouth daily. 90 tablet 3 Taking  . Turmeric 1053 MG TABS Take 1,053 mg by mouth daily.    Taking  . zolpidem (AMBIEN) 10 MG tablet Take 10 mg by mouth at bedtime as needed for sleep.   Taking  . metoprolol succinate (TOPROL-XL) 25 MG 24 hr tablet    Not Taking   Allergies  Allergen Reactions  . Amoxicillin Diarrhea    Has patient had a PCN reaction causing immediate rash, facial/tongue/throat swelling, SOB or lightheadedness with hypotension: No Has patient had a PCN reaction causing severe rash involving mucus membranes or skin necrosis: No Has patient had a PCN reaction that required hospitalization: No Has patient had a PCN reaction occurring within the last 10 years: Unknown If all of the above answers are "NO", then may proceed with Cephalosporin use.   Nydia Bouton [Terramycin] Rash    Social History   Tobacco Use  . Smoking status: Never Smoker  . Smokeless tobacco: Never Used  Substance Use Topics  . Alcohol use: Yes    Alcohol/week: 0.0 standard drinks    Comment: occasional    Family History  Problem Relation Age of Onset  . Heart disease Mother 56       MI  . Lung cancer Mother   . Hypertension Mother   . Alcohol abuse Father   . COPD Father   . Colon cancer Maternal Grandfather 63  . Diabetes Paternal Grandmother   . Breast cancer Maternal Aunt   . Pancreatic cancer Maternal Aunt   . Stomach cancer Neg Hx     Review of Systems  Constitutional: Negative for  fatigue.  HENT: Negative for trouble swallowing.   Eyes: Negative for pain.  Respiratory: Negative for shortness of breath.   Cardiovascular: Negative for leg swelling.  Gastrointestinal: Positive for constipation.  Endocrine: Negative for cold intolerance.  Genitourinary: Negative for difficulty urinating.  Musculoskeletal: Positive for back pain.  Skin: Negative for rash.  Allergic/Immunologic: Negative for food allergies.  Neurological: Negative for weakness.  Hematological: Bruises/bleeds easily.  Psychiatric/Behavioral: Negative for sleep disturbance.   Objective:  Physical Exam  Constitutional: She is oriented to person, place, and time. She appears well-developed and well-nourished.  HENT:  Head: Normocephalic and atraumatic.  Eyes: Pupils are equal, round, and reactive to light. Conjunctivae and EOM are normal.  Neck: Neck supple.  Cardiovascular: Normal  rate, regular rhythm, normal heart sounds and intact distal pulses.  No murmur heard. Respiratory: Effort normal and breath sounds normal.  GI: Soft. Bowel sounds are normal.  Neurological: She is alert and oriented to person, place, and time.  Skin: Skin is warm and dry.  Psychiatric: She has a normal mood and affect. Her behavior is normal. Judgment and thought content normal.    Vital signs in last 24 hours: Temp:  [97.8 F (36.6 C)] 97.8 F (36.6 C) (10/02 1033) Pulse Rate:  [49] 49 (10/02 1033) Resp:  [14] 14 (10/02 1033) BP: (119)/(74) 119/74 (10/02 1033) Weight:  [59.6 kg] 59.6 kg (10/02 1033)  Labs:   Estimated body mass index is 21.7 kg/m as calculated from the following:   Height as of 08/22/18: 5' 5.25" (1.657 m).   Weight as of 08/22/18: 59.6 kg.   Imaging Review Plain radiographs demonstrate moderate degenerative joint disease of the right knee(s). The overall alignment ismild varus. The bone quality appears to be good for age and reported activity level.   Preoperative templating of the joint  replacement has been completed, documented, and submitted to the Operating Room personnel in order to optimize intra-operative equipment management.   Anticipated LOS equal to or greater than 2 midnights due to - Age 56 and older with one or more of the following:  - Obesity  - Expected need for hospital services (PT, OT, Nursing) required for safe  discharge  - Anticipated need for postoperative skilled nursing care or inpatient rehab  - Active co-morbidities: None OR   - Unanticipated findings during/Post Surgery: None  - Patient is a high risk of re-admission due to: None     Assessment/Plan:  End stage arthritis, right knee   The patient history, physical examination, clinical judgment of the provider and imaging studies are consistent with end stage degenerative joint disease of the right knee(s) and total knee arthroplasty is deemed medically necessary. The treatment options including medical management, injection therapy arthroscopy and arthroplasty were discussed at length. The risks and benefits of total knee arthroplasty were presented and reviewed. The risks due to aseptic loosening, infection, stiffness, patella tracking problems, thromboembolic complications and other imponderables were discussed. The patient acknowledged the explanation, agreed to proceed with the plan and consent was signed. Patient is being admitted for inpatient treatment for surgery, pain control, PT, OT, prophylactic antibiotics, VTE prophylaxis, progressive ambulation and ADL's and discharge planning. The patient is planning to be discharged home with home health services   Mike Craze. [place], [place] [phone]  08/22/2018 1:49 PM

## 2018-08-22 NOTE — Progress Notes (Signed)
Chief Complaint:right knee pain.  HPI: [name], 72 y.o. female, has a history of pain and functional disability in the right knee due to arthritis and has failed non-surgical conservative treatments for greater than 12 weeks to includeNSAID's and/or analgesics, corticosteriod injections, viscosupplementation injections, flexibility and strengthening excercises and activity modification.  Onset of symptoms was gradual, starting 10 years ago with gradually worsening course since that time. The patient noted no past surgery on the right knee(s).  Patient currently rates pain in the right knee(s) at 8 out of 10 with activity. Patient has night pain, worsening of pain with activity and weight bearing, pain that interferes with activities of daily living and crepitus.  Patient has evidence of subchondral sclerosis, periarticular osteophytes, joint subluxation and joint space narrowing by imaging studies. There is no active infection.  Patient Active Problem List   Diagnosis Date Noted  . Bilateral primary osteoarthritis of knee 03/12/2018  . Essential hypertension 08/26/2016  . Mitral valve disorder 08/26/2016  . History of palpitations 08/26/2016  . Dyslipidemia 08/26/2016  . Internal hemorrhoids 08/03/2015  . Rectal bleeding 08/03/2015  . Hx of radiation therapy   . Breast cancer of upper-outer quadrant of right female breast ([facility]) 05/23/2013  . Personal history of colonic polyps 01/06/2012  . Irritable bowel syndrome - constipation predominant 01/06/2012   Past Medical History:  Diagnosis Date  . Adenomatous colon polyp 03/07/2012  . Allergic rhinitis   . Arthritis   . Breast cancer ([facility]) 05/20/13   right  . Diverticulosis   . Graves' disease    2011  . History of kidney stones   . HLD (hyperlipidemia)   . Hx of radiation therapy 08/13/13- 09/06/13   right breast 4250 cGy 17 sessions  . Hypertension   . IBS (irritable bowel syndrome)   . Kidney stones   . Lumbar herniated disc     L4-L5  . Osteopenia   . Personal history of radiation therapy   . PONV (postoperative nausea and vomiting)   . PVC (premature ventricular contraction)    Takes metoprolol PRN for  pvc's    Past Surgical History:  Procedure Laterality Date  . BREAST BIOPSY Right   . BREAST LUMPECTOMY     06/11/13  with lymph node biopsy  . BREAST LUMPECTOMY WITH NEEDLE LOCALIZATION AND AXILLARY SENTINEL LYMPH NODE BX Right 06/11/2013   Procedure: BREAST LUMPECTOMY WITH NEEDLE LOCALIZATION AND AXILLARY SENTINEL LYMPH NODE BX;  Surgeon: Harl Bowie, MD;  Location: [facility];  Service: General;  Laterality: Right;  Needle localization BCG 7:30   . COLONOSCOPY W/ BIOPSIES AND POLYPECTOMY  multiple  . KNEE ARTHROSCOPY Left 2006  . KNEE ARTHROSCOPY      Current Facility-Administered Medications  Medication Dose Route Frequency Provider Last Rate Last Dose  . 0.9 %  sodium chloride infusion  500 mL Intravenous Continuous Gatha Mayer, MD       Current Outpatient Medications  Medication Sig Dispense Refill Last Dose  . atorvastatin (LIPITOR) 10 MG tablet Take 10 mg by mouth every evening.    Taking  . buPROPion (WELLBUTRIN XL) 300 MG 24 hr tablet Take 300 mg by mouth daily.   Taking  . Calcium Carb-Cholecalciferol (CALCIUM 1000 + D) 1000-800 MG-UNIT TABS Take 1 tablet by mouth daily.   Taking  . Echinacea 350 MG CAPS Take 350 mg by mouth as needed (Cold prophylaxis).    Taking  . fluticasone (FLONASE) 50 MCG/ACT nasal spray Place 2 sprays into the  nose daily.    Taking  . ibuprofen (ADVIL) 200 MG tablet Take 400 mg by mouth every 6 (six) hours as needed for pain.    Taking  . lisinopril (PRINIVIL,ZESTRIL) 10 MG tablet Take 1 tablet (10 mg total) by mouth daily. NEED OV. (Patient taking differently: Take 10 mg by mouth every other day. NEED OV.) 30 tablet 0 Taking  . meloxicam (MOBIC) 15 MG tablet Take 15 mg by mouth daily.   Taking  . metoprolol tartrate (LOPRESSOR) 25 MG tablet Take 25 mg 1 hour  before Cardiac CT (Patient taking differently: Take 25 mg by mouth as needed (for PVC). ) 1 tablet 0 Taking  . Multiple Vitamin (MULTIVITAMIN) tablet Take 1 tablet by mouth daily.   Taking  . SYNTHROID 100 MCG tablet Take 100 mcg by mouth every other day.    Taking  . SYNTHROID 88 MCG tablet Take 88 mcg by mouth every other day.    Taking  . tamoxifen (NOLVADEX) 20 MG tablet Take 1 tablet (20 mg total) by mouth daily. 90 tablet 3 Taking  . Turmeric 1053 MG TABS Take 1,053 mg by mouth daily.    Taking  . zolpidem (AMBIEN) 10 MG tablet Take 10 mg by mouth at bedtime as needed for sleep.   Taking  . metoprolol succinate (TOPROL-XL) 25 MG 24 hr tablet    Not Taking   Allergies  Allergen Reactions  . Amoxicillin Diarrhea    Has patient had a PCN reaction causing immediate rash, facial/tongue/throat swelling, SOB or lightheadedness with hypotension: No Has patient had a PCN reaction causing severe rash involving mucus membranes or skin necrosis: No Has patient had a PCN reaction that required hospitalization: No Has patient had a PCN reaction occurring within the last 10 years: Unknown If all of the above answers are "NO", then may proceed with Cephalosporin use.   Nydia Bouton [Terramycin] Rash    Social History   Tobacco Use  . Smoking status: Never Smoker  . Smokeless tobacco: Never Used  Substance Use Topics  . Alcohol use: Yes    Alcohol/week: 0.0 standard drinks    Comment: occasional    Family History  Problem Relation Age of Onset  . Heart disease Mother 63       MI  . Lung cancer Mother   . Hypertension Mother   . Alcohol abuse Father   . COPD Father   . Colon cancer Maternal Grandfather 37  . Diabetes Paternal Grandmother   . Breast cancer Maternal Aunt   . Pancreatic cancer Maternal Aunt   . Stomach cancer Neg Hx     Review of Systems  Constitutional: Negative for fatigue.  HENT: Negative for trouble swallowing.   Eyes: Negative for pain.  Respiratory: Negative for  shortness of breath.   Cardiovascular: Negative for leg swelling.  Gastrointestinal: Positive for constipation.  Endocrine: Negative for cold intolerance.  Genitourinary: Negative for difficulty urinating.  Musculoskeletal: Positive for back pain.  Skin: Negative for rash.  Allergic/Immunologic: Negative for food allergies.  Neurological: Negative for weakness.  Hematological: Bruises/bleeds easily.  Psychiatric/Behavioral: Negative for sleep disturbance.   Objective:  Physical Exam  Constitutional: She is oriented to person, place, and time. She appears well-developed and well-nourished.  HENT:  Head: Normocephalic and atraumatic.  Eyes: Pupils are equal, round, and reactive to light. Conjunctivae and EOM are normal.  Neck: Neck supple.  Cardiovascular: Normal rate, regular rhythm, normal heart sounds and intact distal pulses.  No murmur heard.  Respiratory: Effort normal and breath sounds normal.  GI: Soft. Bowel sounds are normal.  Neurological: She is alert and oriented to person, place, and time.  Skin: Skin is warm and dry.  Psychiatric: She has a normal mood and affect. Her behavior is normal. Judgment and thought content normal.    Vital signs in last 24 hours: Temp:  [97.8 F (36.6 C)] 97.8 F (36.6 C) (10/02 1033) Pulse Rate:  [49] 49 (10/02 1033) Resp:  [14] 14 (10/02 1033) BP: (119)/(74) 119/74 (10/02 1033) Weight:  [59.6 kg] 59.6 kg (10/02 1033)  Labs:   Estimated body mass index is 21.7 kg/m as calculated from the following:   Height as of 08/22/18: 5' 5.25" (1.657 m).   Weight as of 08/22/18: 59.6 kg.   Imaging Review Plain radiographs demonstrate moderate degenerative joint disease of the right knee(s). The overall alignment ismild varus. The bone quality appears to be good for age and reported activity level.   Preoperative templating of the joint replacement has been completed, documented, and submitted to the Operating Room personnel in order to  optimize intra-operative equipment management.   Anticipated LOS equal to or greater than 2 midnights due to - Age 81 and older with one or more of the following:  - Obesity  - Expected need for hospital services (PT, OT, Nursing) required for safe  discharge  - Anticipated need for postoperative skilled nursing care or inpatient rehab  - Active co-morbidities: None OR   - Unanticipated findings during/Post Surgery: None  - Patient is a high risk of re-admission due to: None     Assessment/Plan:  End stage arthritis, right knee   The patient history, physical examination, clinical judgment of the provider and imaging studies are consistent with end stage degenerative joint disease of the right knee(s) and total knee arthroplasty is deemed medically necessary. The treatment options including medical management, injection therapy arthroscopy and arthroplasty were discussed at length. The risks and benefits of total knee arthroplasty were presented and reviewed. The risks due to aseptic loosening, infection, stiffness, patella tracking problems, thromboembolic complications and other imponderables were discussed. The patient acknowledged the explanation, agreed to proceed with the plan and consent was signed. Patient is being admitted for inpatient treatment for surgery, pain control, PT, OT, prophylactic antibiotics, VTE prophylaxis, progressive ambulation and ADL's and discharge planning. The patient is planning to be discharged home with home health services   Mike Craze. [place], [place] [phone]  08/22/2018 1:49 PM

## 2018-08-27 MED ORDER — ACETAMINOPHEN 10 MG/ML IV SOLN
1000.0000 mg | Freq: Once | INTRAVENOUS | Status: AC
  Administered 2018-08-28: 1000 mg via INTRAVENOUS
  Filled 2018-08-27: qty 100

## 2018-08-27 MED ORDER — TRANEXAMIC ACID 1000 MG/10ML IV SOLN
2000.0000 mg | INTRAVENOUS | Status: AC
  Administered 2018-08-28: 2000 mg via TOPICAL
  Filled 2018-08-27: qty 20

## 2018-08-27 NOTE — Anesthesia Preprocedure Evaluation (Addendum)
Anesthesia Evaluation  Patient identified by MRN, date of birth, ID band Patient awake    Reviewed: Allergy & Precautions, NPO status , Patient's Chart, lab work & pertinent test results  History of Anesthesia Complications (+) PONV and history of anesthetic complications  Airway Mallampati: IV  TM Distance: >3 FB Neck ROM: Full    Dental no notable dental hx.    Pulmonary neg pulmonary ROS,    Pulmonary exam normal breath sounds clear to auscultation       Cardiovascular hypertension, Pt. on medications and Pt. on home beta blockers Normal cardiovascular exam Rhythm:Regular Rate:Normal  ECG: SB, rate 59   Neuro/Psych negative neurological ROS  negative psych ROS   GI/Hepatic Neg liver ROS, IBS (irritable bowel syndrome)   Endo/Other  Hypothyroidism   Renal/GU negative Renal ROS     Musculoskeletal  (+) Arthritis ,   Abdominal   Peds  Hematology HLD   Anesthesia Other Findings RIGHT KNEE OSTEOARTHRITIS  Reproductive/Obstetrics                            Anesthesia Physical Anesthesia Plan  ASA: II  Anesthesia Plan: Spinal and Regional   Post-op Pain Management:  Regional for Post-op pain   Induction:   PONV Risk Score and Plan: 3 and Midazolam, Dexamethasone, Ondansetron and Treatment may vary due to age or medical condition  Airway Management Planned: Natural Airway  Additional Equipment:   Intra-op Plan:   Post-operative Plan:   Informed Consent: I have reviewed the patients History and Physical, chart, labs and discussed the procedure including the risks, benefits and alternatives for the proposed anesthesia with the patient or authorized representative who has indicated his/her understanding and acceptance.   Dental advisory given  Plan Discussed with: CRNA  Anesthesia Plan Comments:         Anesthesia Quick Evaluation

## 2018-08-28 ENCOUNTER — Encounter (HOSPITAL_COMMUNITY): Payer: Self-pay | Admitting: Anesthesiology

## 2018-08-28 ENCOUNTER — Inpatient Hospital Stay (HOSPITAL_COMMUNITY): Payer: Medicare Other | Admitting: Anesthesiology

## 2018-08-28 ENCOUNTER — Encounter (HOSPITAL_COMMUNITY)
Admission: RE | Disposition: A | Discharge status: Home Health | Payer: Self-pay | Source: Ambulatory Visit | Attending: Orthopaedic Surgery

## 2018-08-28 ENCOUNTER — Other Ambulatory Visit: Payer: Self-pay

## 2018-08-28 ENCOUNTER — Inpatient Hospital Stay (HOSPITAL_COMMUNITY)
Admission: RE | Admit: 2018-08-28 | Discharge: 2018-08-29 | DRG: 470 | Disposition: A | Discharge status: Home Health | Payer: Medicare Other | Source: Ambulatory Visit | Attending: Orthopaedic Surgery | Admitting: Orthopaedic Surgery

## 2018-08-28 DIAGNOSIS — Z791 Long term (current) use of non-steroidal anti-inflammatories (NSAID): Secondary | ICD-10-CM | POA: Diagnosis not present

## 2018-08-28 DIAGNOSIS — M1711 Unilateral primary osteoarthritis, right knee: Secondary | ICD-10-CM | POA: Diagnosis not present

## 2018-08-28 DIAGNOSIS — E039 Hypothyroidism, unspecified: Secondary | ICD-10-CM | POA: Diagnosis present

## 2018-08-28 DIAGNOSIS — E785 Hyperlipidemia, unspecified: Secondary | ICD-10-CM | POA: Diagnosis present

## 2018-08-28 DIAGNOSIS — D62 Acute posthemorrhagic anemia: Secondary | ICD-10-CM | POA: Diagnosis not present

## 2018-08-28 DIAGNOSIS — Z881 Allergy status to other antibiotic agents status: Secondary | ICD-10-CM

## 2018-08-28 DIAGNOSIS — I10 Essential (primary) hypertension: Secondary | ICD-10-CM | POA: Diagnosis present

## 2018-08-28 DIAGNOSIS — Z79899 Other long term (current) drug therapy: Secondary | ICD-10-CM | POA: Diagnosis not present

## 2018-08-28 DIAGNOSIS — Z923 Personal history of irradiation: Secondary | ICD-10-CM | POA: Diagnosis not present

## 2018-08-28 DIAGNOSIS — Z96651 Presence of right artificial knee joint: Secondary | ICD-10-CM

## 2018-08-28 DIAGNOSIS — G8918 Other acute postprocedural pain: Secondary | ICD-10-CM | POA: Diagnosis not present

## 2018-08-28 DIAGNOSIS — Z23 Encounter for immunization: Secondary | ICD-10-CM | POA: Diagnosis not present

## 2018-08-28 DIAGNOSIS — Z853 Personal history of malignant neoplasm of breast: Secondary | ICD-10-CM | POA: Diagnosis not present

## 2018-08-28 HISTORY — DX: Other chronic pain: G89.29

## 2018-08-28 HISTORY — DX: Malignant neoplasm of unspecified site of right female breast: C50.911

## 2018-08-28 HISTORY — PX: TOTAL KNEE ARTHROPLASTY: SHX125

## 2018-08-28 HISTORY — DX: Low back pain, unspecified: M54.50

## 2018-08-28 HISTORY — DX: Low back pain: M54.5

## 2018-08-28 SURGERY — ARTHROPLASTY, KNEE, TOTAL
Anesthesia: Regional | Site: Knee | Laterality: Right

## 2018-08-28 MED ORDER — OXYCODONE HCL 5 MG PO TABS
ORAL_TABLET | ORAL | Status: AC
  Administered 2018-08-29: 10 mg via ORAL
  Filled 2018-08-28: qty 1

## 2018-08-28 MED ORDER — PROPOFOL 10 MG/ML IV BOLUS
INTRAVENOUS | Status: AC
  Filled 2018-08-28: qty 20

## 2018-08-28 MED ORDER — SODIUM CHLORIDE 0.9 % IV SOLN
75.0000 mL/h | INTRAVENOUS | Status: DC
  Administered 2018-08-28 – 2018-08-29 (×2): 75 mL/h via INTRAVENOUS

## 2018-08-28 MED ORDER — CHLORHEXIDINE GLUCONATE 4 % EX LIQD: 60.0000 mL | Freq: Once | CUTANEOUS | Status: DC

## 2018-08-28 MED ORDER — METOCLOPRAMIDE HCL 5 MG/ML IJ SOLN: 5.0000 mg | Freq: Three times a day (TID) | INTRAMUSCULAR | Status: DC | PRN

## 2018-08-28 MED ORDER — METHOCARBAMOL 500 MG PO TABS
ORAL_TABLET | ORAL | Status: AC
  Administered 2018-08-29: 500 mg via ORAL
  Filled 2018-08-28: qty 1

## 2018-08-28 MED ORDER — FAMOTIDINE IN NACL 20-0.9 MG/50ML-% IV SOLN
20.0000 mg | INTRAVENOUS | Status: AC
  Administered 2018-08-28: 20 mg via INTRAVENOUS
  Filled 2018-08-28: qty 50

## 2018-08-28 MED ORDER — DOCUSATE SODIUM 100 MG PO CAPS
100.0000 mg | ORAL_CAPSULE | Freq: Two times a day (BID) | ORAL | Status: DC
  Administered 2018-08-28 – 2018-08-29 (×2): 100 mg via ORAL
  Filled 2018-08-28 (×2): qty 1

## 2018-08-28 MED ORDER — BUPIVACAINE-EPINEPHRINE (PF) 0.25% -1:200000 IJ SOLN
INTRAMUSCULAR | Status: AC
  Filled 2018-08-28: qty 30

## 2018-08-28 MED ORDER — MENTHOL 3 MG MT LOZG: 1.0000 | LOZENGE | OROMUCOSAL | Status: DC | PRN

## 2018-08-28 MED ORDER — ONDANSETRON HCL 4 MG/2ML IJ SOLN: 4.0000 mg | Freq: Once | INTRAMUSCULAR | Status: DC | PRN

## 2018-08-28 MED ORDER — FENTANYL CITRATE (PF) 100 MCG/2ML IJ SOLN: 25.0000 ug | INTRAMUSCULAR | Status: DC | PRN

## 2018-08-28 MED ORDER — MIDAZOLAM HCL 5 MG/5ML IJ SOLN
INTRAMUSCULAR | Status: DC | PRN
  Administered 2018-08-28 (×2): 1 mg via INTRAVENOUS

## 2018-08-28 MED ORDER — METOCLOPRAMIDE HCL 5 MG PO TABS: 5.0000 mg | ORAL_TABLET | Freq: Three times a day (TID) | ORAL | Status: DC | PRN

## 2018-08-28 MED ORDER — FENTANYL CITRATE (PF) 100 MCG/2ML IJ SOLN
12.5000 ug | INTRAMUSCULAR | Status: DC | PRN
  Administered 2018-08-29: 12.5 ug via INTRAVENOUS
  Filled 2018-08-28: qty 2

## 2018-08-28 MED ORDER — ASPIRIN 81 MG PO CHEW
81.0000 mg | CHEWABLE_TABLET | Freq: Two times a day (BID) | ORAL | Status: DC
  Administered 2018-08-28 – 2018-08-29 (×2): 81 mg via ORAL
  Filled 2018-08-28 (×2): qty 1

## 2018-08-28 MED ORDER — FLUTICASONE PROPIONATE 50 MCG/ACT NA SUSP
2.0000 | Freq: Every day | NASAL | Status: DC
  Administered 2018-08-29: 2 via NASAL
  Filled 2018-08-28: qty 16

## 2018-08-28 MED ORDER — ATORVASTATIN CALCIUM 10 MG PO TABS
10.0000 mg | ORAL_TABLET | Freq: Every evening | ORAL | Status: DC
  Administered 2018-08-28: 10 mg via ORAL
  Filled 2018-08-28: qty 1

## 2018-08-28 MED ORDER — LEVOTHYROXINE SODIUM 100 MCG PO TABS: 100.0000 ug | ORAL_TABLET | ORAL | Status: DC

## 2018-08-28 MED ORDER — BUPIVACAINE IN DEXTROSE 0.75-8.25 % IT SOLN
INTRATHECAL | Status: DC | PRN
  Administered 2018-08-28: 1.6 mL via INTRATHECAL

## 2018-08-28 MED ORDER — DIPHENHYDRAMINE HCL 12.5 MG/5ML PO ELIX: 12.5000 mg | ORAL_SOLUTION | ORAL | Status: DC | PRN

## 2018-08-28 MED ORDER — METOPROLOL TARTRATE 25 MG PO TABS: 25.0000 mg | ORAL_TABLET | ORAL | Status: DC | PRN

## 2018-08-28 MED ORDER — FENTANYL CITRATE (PF) 250 MCG/5ML IJ SOLN
INTRAMUSCULAR | Status: AC
  Filled 2018-08-28: qty 5

## 2018-08-28 MED ORDER — LEVOTHYROXINE SODIUM 88 MCG PO TABS
88.0000 ug | ORAL_TABLET | ORAL | Status: DC
  Administered 2018-08-29: 88 ug via ORAL
  Filled 2018-08-28: qty 1

## 2018-08-28 MED ORDER — ALUM & MAG HYDROXIDE-SIMETH 200-200-20 MG/5ML PO SUSP: 30.0000 mL | ORAL | Status: DC | PRN

## 2018-08-28 MED ORDER — ROPIVACAINE HCL 5 MG/ML IJ SOLN
INTRAMUSCULAR | Status: DC | PRN
  Administered 2018-08-28: 30 mL via PERINEURAL

## 2018-08-28 MED ORDER — LACTATED RINGERS IV SOLN
INTRAVENOUS | Status: DC | PRN
  Administered 2018-08-28: 07:00:00 via INTRAVENOUS

## 2018-08-28 MED ORDER — OXYCODONE HCL 5 MG PO TABS
5.0000 mg | ORAL_TABLET | ORAL | Status: DC | PRN
  Administered 2018-08-28: 5 mg via ORAL
  Administered 2018-08-29: 10 mg via ORAL
  Administered 2018-08-29 (×2): 5 mg via ORAL
  Administered 2018-08-29: 10 mg via ORAL
  Filled 2018-08-28: qty 2
  Filled 2018-08-28: qty 1
  Filled 2018-08-28: qty 2
  Filled 2018-08-28: qty 1

## 2018-08-28 MED ORDER — SODIUM CHLORIDE 0.9 % IV SOLN
INTRAVENOUS | Status: DC | PRN
  Administered 2018-08-28: 10 ug/min via INTRAVENOUS

## 2018-08-28 MED ORDER — ADULT MULTIVITAMIN W/MINERALS CH: 1.0000 | ORAL_TABLET | Freq: Every day | ORAL | Status: DC

## 2018-08-28 MED ORDER — CALCIUM CARBONATE-VITAMIN D 500-200 MG-UNIT PO TABS
2.0000 | ORAL_TABLET | Freq: Every day | ORAL | Status: DC
  Filled 2018-08-28: qty 2

## 2018-08-28 MED ORDER — CEFAZOLIN SODIUM-DEXTROSE 2-4 GM/100ML-% IV SOLN
2.0000 g | INTRAVENOUS | Status: AC
  Administered 2018-08-28: 2 g via INTRAVENOUS
  Filled 2018-08-28: qty 100

## 2018-08-28 MED ORDER — ONDANSETRON HCL 4 MG/2ML IJ SOLN
INTRAMUSCULAR | Status: DC | PRN
  Administered 2018-08-28: 4 mg via INTRAVENOUS

## 2018-08-28 MED ORDER — BUPIVACAINE-EPINEPHRINE 0.25% -1:200000 IJ SOLN
INTRAMUSCULAR | Status: DC | PRN
  Administered 2018-08-28: 30 mL

## 2018-08-28 MED ORDER — FENTANYL CITRATE (PF) 100 MCG/2ML IJ SOLN
INTRAMUSCULAR | Status: DC | PRN
  Administered 2018-08-28 (×2): 25 ug via INTRAVENOUS
  Administered 2018-08-28: 50 ug via INTRAVENOUS

## 2018-08-28 MED ORDER — CEFAZOLIN SODIUM-DEXTROSE 2-4 GM/100ML-% IV SOLN
2.0000 g | Freq: Four times a day (QID) | INTRAVENOUS | Status: AC
  Administered 2018-08-28 (×2): 2 g via INTRAVENOUS
  Filled 2018-08-28 (×2): qty 100

## 2018-08-28 MED ORDER — KETOROLAC TROMETHAMINE 15 MG/ML IJ SOLN
INTRAMUSCULAR | Status: AC
  Administered 2018-08-28: 7.5 mg via INTRAVENOUS
  Filled 2018-08-28: qty 1

## 2018-08-28 MED ORDER — METHOCARBAMOL 500 MG PO TABS
500.0000 mg | ORAL_TABLET | Freq: Four times a day (QID) | ORAL | Status: DC | PRN
  Administered 2018-08-28 – 2018-08-29 (×3): 500 mg via ORAL
  Filled 2018-08-28 (×2): qty 1

## 2018-08-28 MED ORDER — MAGNESIUM HYDROXIDE 400 MG/5ML PO SUSP: 30.0000 mL | Freq: Every day | ORAL | Status: DC | PRN

## 2018-08-28 MED ORDER — ONDANSETRON HCL 4 MG/2ML IJ SOLN: 4.0000 mg | Freq: Four times a day (QID) | INTRAMUSCULAR | Status: DC | PRN

## 2018-08-28 MED ORDER — LISINOPRIL 10 MG PO TABS: 10.0000 mg | ORAL_TABLET | ORAL | Status: DC

## 2018-08-28 MED ORDER — MIDAZOLAM HCL 2 MG/2ML IJ SOLN
INTRAMUSCULAR | Status: AC
  Filled 2018-08-28: qty 2

## 2018-08-28 MED ORDER — ACETAMINOPHEN 325 MG PO TABS: 325.0000 mg | ORAL_TABLET | Freq: Four times a day (QID) | ORAL | Status: DC | PRN

## 2018-08-28 MED ORDER — ONDANSETRON HCL 4 MG PO TABS
4.0000 mg | ORAL_TABLET | Freq: Four times a day (QID) | ORAL | Status: DC | PRN
  Administered 2018-08-29: 4 mg via ORAL
  Filled 2018-08-28: qty 1

## 2018-08-28 MED ORDER — SODIUM CHLORIDE 0.9 % IR SOLN
Status: DC | PRN
  Administered 2018-08-28: 3000 mL

## 2018-08-28 MED ORDER — METHOCARBAMOL 1000 MG/10ML IJ SOLN
500.0000 mg | Freq: Four times a day (QID) | INTRAVENOUS | Status: DC | PRN
  Filled 2018-08-28: qty 5

## 2018-08-28 MED ORDER — PHENOL 1.4 % MT LIQD: 1.0000 | OROMUCOSAL | Status: DC | PRN

## 2018-08-28 MED ORDER — BUPROPION HCL ER (XL) 150 MG PO TB24
300.0000 mg | ORAL_TABLET | Freq: Every day | ORAL | Status: DC
  Administered 2018-08-29: 300 mg via ORAL
  Filled 2018-08-28: qty 2

## 2018-08-28 MED ORDER — INFLUENZA VAC SPLIT HIGH-DOSE 0.5 ML IM SUSY
0.5000 mL | PREFILLED_SYRINGE | INTRAMUSCULAR | Status: AC
  Administered 2018-08-29: 0.5 mL via INTRAMUSCULAR
  Filled 2018-08-28: qty 0.5

## 2018-08-28 MED ORDER — PROPOFOL 500 MG/50ML IV EMUL
INTRAVENOUS | Status: DC | PRN
  Administered 2018-08-28: 50 ug/kg/min via INTRAVENOUS

## 2018-08-28 MED ORDER — CALCIUM CARB-CHOLECALCIFEROL 1000-800 MG-UNIT PO TABS: 1.0000 | ORAL_TABLET | Freq: Every day | ORAL | Status: DC

## 2018-08-28 MED ORDER — TAMOXIFEN CITRATE 10 MG PO TABS
20.0000 mg | ORAL_TABLET | Freq: Every day | ORAL | Status: DC
  Administered 2018-08-28: 20 mg via ORAL
  Filled 2018-08-28 (×2): qty 2

## 2018-08-28 MED ORDER — MAGNESIUM CITRATE PO SOLN: 1.0000 | Freq: Once | ORAL | Status: DC | PRN

## 2018-08-28 MED ORDER — BISACODYL 10 MG RE SUPP: 10.0000 mg | Freq: Every day | RECTAL | Status: DC | PRN

## 2018-08-28 MED ORDER — SODIUM CHLORIDE 0.9 % IV SOLN: INTRAVENOUS | Status: DC

## 2018-08-28 MED ORDER — 0.9 % SODIUM CHLORIDE (POUR BTL) OPTIME
TOPICAL | Status: DC | PRN
  Administered 2018-08-28: 1000 mL

## 2018-08-28 MED ORDER — ACETAMINOPHEN 10 MG/ML IV SOLN
1000.0000 mg | Freq: Four times a day (QID) | INTRAVENOUS | Status: AC
  Administered 2018-08-28 – 2018-08-29 (×4): 1000 mg via INTRAVENOUS
  Filled 2018-08-28 (×5): qty 100

## 2018-08-28 MED ORDER — ZOLPIDEM TARTRATE 5 MG PO TABS: 5.0000 mg | ORAL_TABLET | Freq: Every evening | ORAL | Status: DC | PRN

## 2018-08-28 MED ORDER — KETOROLAC TROMETHAMINE 15 MG/ML IJ SOLN
7.5000 mg | Freq: Four times a day (QID) | INTRAMUSCULAR | Status: AC
  Administered 2018-08-28 – 2018-08-29 (×4): 7.5 mg via INTRAVENOUS
  Filled 2018-08-28 (×3): qty 1

## 2018-08-28 MED ORDER — DEXAMETHASONE SODIUM PHOSPHATE 10 MG/ML IJ SOLN
INTRAMUSCULAR | Status: DC | PRN
  Administered 2018-08-28: 5 mg via INTRAVENOUS

## 2018-08-28 SURGICAL SUPPLY — 64 items
BAG DECANTER FOR FLEXI CONT (MISCELLANEOUS) ×2 IMPLANT
BANDAGE ESMARK 6X9 LF (GAUZE/BANDAGES/DRESSINGS) ×1 IMPLANT
BLADE SAGITTAL 25.0X1.19X90 (BLADE) ×2 IMPLANT
BNDG ESMARK 6X9 LF (GAUZE/BANDAGES/DRESSINGS) ×2
BOWL SMART MIX CTS (DISPOSABLE) ×2 IMPLANT
CEMENT HV SMART SET (Cement) ×4 IMPLANT
CEMENT TIBIA MBT (Knees) ×1 IMPLANT
COMP FEM CEM STD RT LCS (Orthopedic Implant) ×2 IMPLANT
COMP PATELLA PEGX3 CEM STAN (Knees) ×2 IMPLANT
COMPONENT FEM CEM STD RT LCS (Orthopedic Implant) ×1 IMPLANT
COMPONENT PTLLA PEGX3 CEM STAN (Knees) ×1 IMPLANT
COVER SURGICAL LIGHT HANDLE (MISCELLANEOUS) ×2 IMPLANT
COVER WAND RF STERILE (DRAPES) ×2 IMPLANT
CUFF TOURNIQUET SINGLE 34IN LL (TOURNIQUET CUFF) ×2 IMPLANT
DECANTER SPIKE VIAL GLASS SM (MISCELLANEOUS) ×2 IMPLANT
DRAPE EXTREMITY T 121X128X90 (DRAPE) ×2 IMPLANT
DRAPE HALF SHEET 40X57 (DRAPES) ×4 IMPLANT
DRSG ADAPTIC 3X8 NADH LF (GAUZE/BANDAGES/DRESSINGS) ×2 IMPLANT
DRSG PAD ABDOMINAL 8X10 ST (GAUZE/BANDAGES/DRESSINGS) ×4 IMPLANT
DURAPREP 26ML APPLICATOR (WOUND CARE) ×4 IMPLANT
ELECT CAUTERY BLADE 6.4 (BLADE) ×2 IMPLANT
ELECT REM PT RETURN 9FT ADLT (ELECTROSURGICAL) ×2
ELECTRODE REM PT RTRN 9FT ADLT (ELECTROSURGICAL) ×1 IMPLANT
FACESHIELD WRAPAROUND (MASK) ×4 IMPLANT
GAUZE SPONGE 4X4 12PLY STRL (GAUZE/BANDAGES/DRESSINGS) ×2 IMPLANT
GLOVE BIOGEL PI IND STRL 8 (GLOVE) ×2 IMPLANT
GLOVE BIOGEL PI IND STRL 8.5 (GLOVE) ×1 IMPLANT
GLOVE BIOGEL PI INDICATOR 8 (GLOVE) ×2
GLOVE BIOGEL PI INDICATOR 8.5 (GLOVE) ×1
GLOVE ECLIPSE 7.0 STRL STRAW (GLOVE) ×4 IMPLANT
GLOVE ECLIPSE 8.0 STRL XLNG CF (GLOVE) ×6 IMPLANT
GLOVE ECLIPSE 8.5 STRL (GLOVE) ×6 IMPLANT
GOWN STRL REUS W/ TWL LRG LVL3 (GOWN DISPOSABLE) ×2 IMPLANT
GOWN STRL REUS W/TWL 2XL LVL3 (GOWN DISPOSABLE) ×2 IMPLANT
GOWN STRL REUS W/TWL LRG LVL3 (GOWN DISPOSABLE) ×2
HANDPIECE INTERPULSE COAX TIP (DISPOSABLE) ×1
INSERT TIB LCS RP STD 12.5 (Knees) ×2 IMPLANT
KIT BASIN OR (CUSTOM PROCEDURE TRAY) ×2 IMPLANT
KIT TURNOVER KIT B (KITS) ×2 IMPLANT
MANIFOLD NEPTUNE II (INSTRUMENTS) ×2 IMPLANT
NEEDLE 22X1 1/2 (OR ONLY) (NEEDLE) ×4 IMPLANT
NS IRRIG 1000ML POUR BTL (IV SOLUTION) ×2 IMPLANT
PACK TOTAL JOINT (CUSTOM PROCEDURE TRAY) ×2 IMPLANT
PAD ARMBOARD 7.5X6 YLW CONV (MISCELLANEOUS) ×4 IMPLANT
PAD CAST 4YDX4 CTTN HI CHSV (CAST SUPPLIES) ×1 IMPLANT
PADDING CAST COTTON 4X4 STRL (CAST SUPPLIES) ×1
PADDING CAST COTTON 6X4 STRL (CAST SUPPLIES) ×2 IMPLANT
PIN STEINMAN FIXATION KNEE (PIN) ×2 IMPLANT
SET HNDPC FAN SPRY TIP SCT (DISPOSABLE) ×1 IMPLANT
STAPLER VISISTAT 35W (STAPLE) IMPLANT
SUCTION FRAZIER HANDLE 10FR (MISCELLANEOUS) ×1
SUCTION TUBE FRAZIER 10FR DISP (MISCELLANEOUS) ×1 IMPLANT
SUT BONE WAX W31G (SUTURE) ×2 IMPLANT
SUT ETHIBOND NAB CT1 #1 30IN (SUTURE) ×4 IMPLANT
SUT MNCRL AB 3-0 PS2 18 (SUTURE) ×2 IMPLANT
SUT VIC AB 0 CT1 27 (SUTURE) ×2
SUT VIC AB 0 CT1 27XBRD ANBCTR (SUTURE) ×1 IMPLANT
SYR CONTROL 10ML LL (SYRINGE) IMPLANT
TIBIA MBT CEMENT (Knees) ×2 IMPLANT
TOWEL OR 17X24 6PK STRL BLUE (TOWEL DISPOSABLE) ×2 IMPLANT
TOWEL OR 17X26 10 PK STRL BLUE (TOWEL DISPOSABLE) ×2 IMPLANT
TRAY FOLEY BAG SILVER LF 16FR (SET/KITS/TRAYS/PACK) ×2 IMPLANT
TRAY FOLEY MTR SLVR 16FR STAT (SET/KITS/TRAYS/PACK) ×2 IMPLANT
WRAP KNEE MAXI GEL POST OP (GAUZE/BANDAGES/DRESSINGS) ×2 IMPLANT

## 2018-08-28 NOTE — Anesthesia Procedure Notes (Signed)
Spinal  Patient location during procedure: OR Start time: 08/28/2018 7:30 AM End time: 08/28/2018 7:35 AM Staffing Anesthesiologist: Murvin Natal, MD Performed: anesthesiologist  Preanesthetic Checklist Completed: patient identified, surgical consent, pre-op evaluation, timeout performed, IV checked, risks and benefits discussed and monitors and equipment checked Spinal Block Patient position: sitting Prep: DuraPrep Patient monitoring: cardiac monitor, continuous pulse ox and blood pressure Approach: midline Location: L4-5 Injection technique: single-shot Needle Needle type: Pencan  Needle gauge: 24 G Needle length: 9 cm Assessment Sensory level: T10 Additional Notes Functioning IV was confirmed and monitors were applied. Sterile prep and drape, including hand hygiene and sterile gloves were used. The patient was positioned and the spine was prepped. The skin was anesthetized with lidocaine.  Free flow of clear CSF was obtained prior to injecting local anesthetic into the CSF.  The spinal needle aspirated freely following injection.  The needle was carefully withdrawn.  The patient tolerated the procedure well.

## 2018-08-28 NOTE — Progress Notes (Signed)
The recent History & Physical has been reviewed. I have personally examined the patient today. There is no interval change to the documented History & Physical. The patient would like to proceed with the procedure.  Garald Balding 08/28/2018,  7:04 AM  Patient ID: [name], female   DOB: 1946-09-05, 72 y.o.   MRN: [id number]

## 2018-08-28 NOTE — Anesthesia Procedure Notes (Signed)
Procedure Name: General with mask airway Date/Time: 08/28/2018 7:35 AM Performed by: Marsa Aris, CRNA Pre-anesthesia Checklist: Timeout performed, Patient being monitored, Suction available, Emergency Drugs available and Patient identified Patient Re-evaluated:Patient Re-evaluated prior to induction Oxygen Delivery Method: Simple face mask Preoxygenation: Pre-oxygenation with 100% oxygen Induction Type: IV induction

## 2018-08-28 NOTE — Anesthesia Procedure Notes (Signed)
Anesthesia Regional Block: Adductor canal block   Pre-Anesthetic Checklist: ,, timeout performed, Correct Patient, Correct Site, Correct Laterality, Correct Procedure,, site marked, risks and benefits discussed, Surgical consent,  Pre-op evaluation,  At surgeon's request and post-op pain management  Laterality: Right  Prep: chloraprep       Needles:  Injection technique: Single-shot  Needle Type: Echogenic Stimulator Needle     Needle Length: 9cm  Needle Gauge: 21     Additional Needles:   Procedures:,,,, ultrasound used (permanent image in chart),,,,  Narrative:  Start time: 08/28/2018 7:10 AM End time: 08/28/2018 7:15 AM Injection made incrementally with aspirations every 5 mL.  Performed by: Personally  Anesthesiologist: Murvin Natal, MD  Additional Notes: Functioning IV was confirmed and monitors were applied. A time-out was performed. Hand hygiene and sterile gloves were used. The thigh was placed in a frog-leg position and prepped in a sterile fashion. A 54mm 21ga Arrow echogenic stimulator needle was placed using ultrasound guidance.  Negative aspiration and negative test dose prior to incremental administration of local anesthetic. The patient tolerated the procedure well.

## 2018-08-28 NOTE — Progress Notes (Signed)
PATIENT ID:      [name]  MRN:     [id number] DOB/AGE:    1946-07-22 / 72 y.o.       OPERATIVE REPORT    DATE OF PROCEDURE:  08/28/2018       PREOPERATIVE DIAGNOSIS:END STAGE   RIGHT KNEE OSTEOARTHRITIS                                                       Estimated body mass index is 21.7 kg/m as calculated from the following:   Height as of 08/22/18: 5' 5.25" (1.657 m).   Weight as of 08/22/18: 59.6 kg.     POSTOPERATIVE DIAGNOSIS:END STAGE   RIGHT KNEE OSTEOARTHRITIS                                                                     Estimated body mass index is 21.7 kg/m as calculated from the following:   Height as of 08/22/18: 5' 5.25" (1.657 m).   Weight as of 08/22/18: 59.6 kg.     PROCEDURE:  Procedure(s): RIGHT TOTAL KNEE ARTHROPLASTY      SURGEON:  [name], MD    ASSISTANT:   Biagio Borg, PA-C   (Present and scrubbed throughout the case, critical for assistance with exposure, retraction, instrumentation, and closure.)          ANESTHESIA: regional, spinal and IV sedation     DRAINS: none :      TOURNIQUET TIME:  Total Tourniquet Time Documented: Thigh (Right) - 78 minutes Total: Thigh (Right) - 78 minutes     COMPLICATIONS:  None   CONDITION:  stable  PROCEDURE IN DETAIL: [name] 08/28/2018, 9:31 AM  Patient ID: [name], female   DOB: 07/07/1946, 72 y.o.   MRN: [id number]

## 2018-08-28 NOTE — Transfer of Care (Signed)
Immediate Anesthesia Transfer of Care Note  Patient: [name]  Procedure(s) Performed: RIGHT TOTAL KNEE ARTHROPLASTY (Right Knee)  Patient Location: PACU  Anesthesia Type:MAC combined with regional for post-op pain  Level of Consciousness: awake, alert  and oriented  Airway & Oxygen Therapy: Patient Spontanous Breathing  Post-op Assessment: Report given to RN and Post -op Vital signs reviewed and stable  Post vital signs: Reviewed and stable  Last Vitals:  Vitals Value Taken Time  BP 110/70 08/28/2018  9:56 AM  Temp    Pulse 60 08/28/2018  9:59 AM  Resp 15 08/28/2018  9:59 AM  SpO2 100 % 08/28/2018  9:59 AM  Vitals shown include unvalidated device data.  Last Pain:  Vitals:   08/28/18 0642  TempSrc:   PainSc: 0-No pain         Complications: No apparent anesthesia complications

## 2018-08-28 NOTE — Anesthesia Postprocedure Evaluation (Signed)
Anesthesia Post Note  Patient: [name]  Procedure(s) Performed: RIGHT TOTAL KNEE ARTHROPLASTY (Right Knee)     Patient location during evaluation: PACU Anesthesia Type: Regional and Spinal Level of consciousness: oriented and awake and alert Pain management: pain level controlled Vital Signs Assessment: post-procedure vital signs reviewed and stable Respiratory status: spontaneous breathing, respiratory function stable and patient connected to nasal cannula oxygen Cardiovascular status: blood pressure returned to baseline and stable Postop Assessment: no headache, no backache and no apparent nausea or vomiting Anesthetic complications: no    Last Vitals:  Vitals:   08/28/18 1230 08/28/18 1305  BP: (!) 150/79 136/79  Pulse: (!) 55 (!) 53  Resp: 16 20  Temp:  36.6 C  SpO2: 100% 97%    Last Pain:  Vitals:   08/28/18 1305  TempSrc: Oral  PainSc:                  Ryan P Ellender

## 2018-08-28 NOTE — Op Note (Signed)
NAME: [name], SP[facility] MEDICAL RECORD [id number] ACCOUNT [id number] DATE OF BIRTH:07-Jan-1946 FACILITY: [facility] LOCATION: [place], [place]  OPERATIVE REPORT  DATE OF PROCEDURE:  08/28/2018  PREOPERATIVE DIAGNOSIS:  End-stage osteoarthritis, right knee.  POSTOPERATIVE DIAGNOSIS:  End-stage osteoarthritis, right knee.  PROCEDURE:  Right total knee replacement.  SURGEON:  [name], MD  ASSISTANT:  Biagio Borg PA-C.  ANESTHESIA:  Spinal with adductor canal block and IV sedation.  ESTIMATED BLOOD LOSS:  None.  COMPONENTS:  DePuy LCS standard femoral component, a 3 rotating D tibial tray, a 12.5 mm polyethylene bridging bearing a metal backed 3 peg rotating patella.  Components were secured with polymethyl methacrylate.  DESCRIPTION OF PROCEDURE:  The patient was met in the holding area and identified the right knee as appropriate operative site and marked it accordingly.  Anesthesia performed an adductor canal block.  The patient was then transported to room 7.  Anesthesia performed a spinal anesthetic without difficulty.  The patient was then placed in the supine position with IV sedation.  Nursing staff inserted a Foley catheter.  Urine was clear.  The right lower extremity was then placed in a thigh tourniquet.  The leg was prepped with chlorhexidine scrub and DuraPrep x2 from the tourniquet to the tips of the toes.  Sterile draping was performed.  A timeout was called.  The lower extremity was Esmarched exsanguinated with a proximal tourniquet at 325 mmHg.  A midline longitudinal incision was made centered about the patella extending from the superior pouch to tibial tubercle.  Via sharp dissection, incision carried down to subcutaneous tissue.  Small bleeders were Bovie coagulated.  Superficial fascia was  incised in the midline.  A medial parapatellar incision was made with the Bovie through the deep capsule.  The joint was entered.  There  was a small clear yellow joint effusion.  I was easily able to place the patella in an everted position at 180 degrees with the knee flexed to 90 degrees.  There was a mild synovitis.  There were large areas of articular cartilage loss on the femoral condyle, more medial than lateral, and large  osteophytes on both medial and lateral compartments.  Alignment appeared to be neutral.  I measured a standard femoral component.  First bony cut was then made transversely in the proximal tibia with a 7 degree angle of declination.  After each bony cut, I checked external alignment with the external guide.  Subsequent cuts were then made on the femur using the standard femoral jig.   Laminar spreaders were then placed in the medial and lateral compartment.  I removed medial and lateral menisci as well as ACL and PCL.  There was a moderate sized Baker cyst in the posterior medial compartment.  Any areas of synovitis were debrided.   Osteophytes were removed from the posterior femoral condyle using a 3/4 inch curved osteotome.  No loose bodies were identified.  MCL and LCL remained intact throughout the procedure.  Flexion and extension gaps were symmetrical at 12.5 mm.  Distal femoral valgus cut was then made with the standard femoral jig with 4 degrees of valgus.  The cuts were made such that the extension gap was symmetrical to the flexion gap at 12.5 mm.  Finishing guide was then applied to obtain the tapering cuts  in the center hole.  Retractors were then placed about the tibia was advanced anteriorly.  We measured a 3 tibial tray.  This was pinned in place and checked with  the external guide.  Central hole was then made followed by the keeled cut.  The tibial jig in place the 12.5 mm bridging bearing was applied followed by the trial standard femoral component.  The entire construct was reduced and through the full range of motion we had full extension and no opening with varus or valgus stress.    There was no malrotation of the tibial component with flexion over 105 degrees.  Patella was then prepared by removing 10 mm of bone leaving 13 mm of patella thickness.  The patellar jig was applied, 3holes made and trial patella inserted and through a full range of motion remained perfectly stable.  Trial components were removed.  The joint was copiously irrigated with saline solution.  Retractors were then placed.  The knee was placed in 90 degrees and the final components impacted after repeat saline irrigation.  We initially applied the 3 tibial tray with methacrylate followed by the 12.5 mm bridging bearing in the standard femoral component.  These were impacted in place and any extraneous methacrylate was removed from the periphery of the components.  Patella was applied with methacrylate and a patellar clamp.  At approximately 16 minutes, the methacrylate had matured.  During that interval, we injected the deep capsule with 0.25% Marcaine with epinephrine.  At 77 minutes, the tourniquet was released.  Any gross bleeders were Bovie coagulated.  I thought we had a nice dry field and then applied tranexamic acid topically under compression for over 5 minutes.  The deep capsule was then closed with a running 0 Ethibond suture, the superficial capsule with 0 Vicryl, subcutaneous with 3-0 Monocryl.  Skin was closed with skin clips.  A sterile bulky dressing was applied followed by the patient's support stocking.  The patient tolerated the procedure without complications.  TN/NUANCE  D:08/28/2018 T:08/28/2018 JOB:[id number]

## 2018-08-28 NOTE — Evaluation (Signed)
Physical Therapy Evaluation Patient Details Name: [name] MRN: [id number] DOB: 05-20-1946 Today's Date: 08/28/2018   History of Present Illness  Pt presents for R TKA with PMH: OA, h/o breast cancer, Graves disease   Clinical Impression  Pt is s/p TKA resulting in the deficits listed below (see PT Problem List). Pt mobilizing very well, ambulated 41' with RW and min-guard A. Was able to get to EOB without assist. Reviewed proper positioning and exercises.  Pt will benefit from skilled PT to increase their independence and safety with mobility to allow discharge to the venue listed below.      Follow Up Recommendations Follow surgeon's recommendation for DC plan and follow-up therapies    Equipment Recommendations  Rolling walker with 5" wheels    Recommendations for Other Services       Precautions / Restrictions Precautions Precautions: Knee Precaution Booklet Issued: Yes (comment) Precaution Comments: reviewed proper positioning of knee as well as ther ex Restrictions Weight Bearing Restrictions: Yes RLE Weight Bearing: Partial weight bearing RLE Partial Weight Bearing Percentage or Pounds: 50      Mobility  Bed Mobility Overal bed mobility: Modified Independent             General bed mobility comments: pt able to get to EOB independently with HOB slightly elevated  Transfers Overall transfer level: Needs assistance Equipment used: Rolling walker (2 wheeled) Transfers: Sit to/from Stand Sit to Stand: Min guard         General transfer comment: vc's for hand placement, min-guard for safety   Ambulation/Gait Ambulation/Gait assistance: Min guard Gait Distance (Feet): 60 Feet Assistive device: Rolling walker (2 wheeled) Gait Pattern/deviations: Step-through pattern;Decreased weight shift to right;Decreased stance time - right Gait velocity: decreased Gait velocity interpretation: <1.8 ft/sec, indicate of risk for recurrent falls General Gait  Details: pt not familiar with RW and kept picking up, improved with practice.    Stairs            Wheelchair Mobility    Modified Rankin (Stroke Patients Only)       Balance Overall balance assessment: No apparent balance deficits (not formally assessed)                                           Pertinent Vitals/Pain Pain Assessment: Faces Faces Pain Scale: Hurts even more Pain Location: R knee Pain Descriptors / Indicators: Operative site guarding;Sore Pain Intervention(s): Limited activity within patient's tolerance;Monitored during session;Premedicated before session    [place] expects to be discharged to:: Private residence Living Arrangements: Spouse/significant other Available Help at Discharge: Family;Available 24 hours/day Type of Home: House Home Access: Stairs to enter Entrance Stairs-Rails: None Entrance Stairs-Number of Steps: 3 Home Layout: Two level;Full bath on main level;Able to live on main level with bedroom/bathroom Home Equipment: Walker - standard;Bedside commode      Prior Function Level of Independence: Independent         Comments: pt attended pilates regularly to prepare for surgery     Hand Dominance        Extremity/Trunk Assessment   Upper Extremity Assessment Upper Extremity Assessment: Overall WFL for tasks assessed    Lower Extremity Assessment Lower Extremity Assessment: RLE deficits/detail RLE Deficits / Details: hip flex 3/5, knee ext 3/5, ankle WFL RLE Sensation: WNL RLE Coordination: WNL    Cervical / Trunk Assessment Cervical / Trunk  Assessment: Normal  Communication   Communication: No difficulties  Cognition Arousal/Alertness: Awake/alert Behavior During Therapy: WFL for tasks assessed/performed Overall Cognitive Status: Within Functional Limits for tasks assessed                                        General Comments      Exercises Total Joint  Exercises Ankle Circles/Pumps: AROM;Both;10 reps;Seated Quad Sets: AROM;Both;10 reps;Seated Long Arc Quad: AROM;Right;5 reps;Seated Goniometric ROM: 5-80   Assessment/Plan    PT Assessment Patient needs continued PT services  PT Problem List Decreased strength;Decreased range of motion;Decreased mobility;Decreased knowledge of precautions;Decreased knowledge of use of DME;Pain       PT Treatment Interventions Gait training;DME instruction;Stair training;Functional mobility training;Therapeutic activities;Therapeutic exercise;Patient/family education    PT Goals (Current goals can be found in the Care Plan section)  Acute Rehab PT Goals Patient Stated Goal: return home  PT Goal Formulation: With patient Time For Goal Achievement: 09/04/18 Potential to Achieve Goals: Good    Frequency 7X/week   Barriers to discharge        Co-evaluation               AM-PAC PT "6 Clicks" Daily Activity  Outcome Measure Difficulty turning over in bed (including adjusting bedclothes, sheets and blankets)?: A Little Difficulty moving from lying on back to sitting on the side of the bed? : A Little Difficulty sitting down on and standing up from a chair with arms (e.g., wheelchair, bedside commode, etc,.)?: A Little Help needed moving to and from a bed to chair (including a wheelchair)?: A Little Help needed walking in hospital room?: A Little Help needed climbing 3-5 steps with a railing? : A Little 6 Click Score: 18    End of Session Equipment Utilized During Treatment: Gait belt Activity Tolerance: Patient tolerated treatment well Patient left: in chair;with call bell/phone within reach Nurse Communication: Mobility status PT Visit Diagnosis: Pain;Difficulty in walking, not elsewhere classified (R26.2) Pain - Right/Left: Right Pain - part of body: Knee    Time: 1559-1630 PT Time Calculation (min) (ACUTE ONLY): 31 min   Charges:   PT Evaluation $PT Eval Low Complexity: 1  Low PT Treatments $Gait Training: 8-22 mins        [place]  Pager [phone] Office [place] 08/28/2018, 4:45 PM

## 2018-08-28 NOTE — Plan of Care (Signed)
  Problem: Pain Managment: Goal: General experience of comfort will improve Outcome: Progressing   Problem: Safety: Goal: Ability to remain free from injury will improve Outcome: Progressing   

## 2018-08-28 NOTE — Progress Notes (Signed)
Orthopedic Tech Progress Note Patient Details:  [name] 1946/07/02 [id number]  CPM Right Knee CPM Right Knee: On Right Knee Flexion (Degrees): 90 Right Knee Extension (Degrees): 0 Additional Comments: trapeze bar patient helper  Post Interventions Patient Tolerated: Well Instructions Provided: Care of device Viewed order from doctor's order list Hildred Priest 08/28/2018, 12:46 PM

## 2018-08-29 ENCOUNTER — Encounter (HOSPITAL_COMMUNITY): Payer: Self-pay | Admitting: Orthopaedic Surgery

## 2018-08-29 DIAGNOSIS — D62 Acute posthemorrhagic anemia: Secondary | ICD-10-CM | POA: Diagnosis not present

## 2018-08-29 DIAGNOSIS — M1711 Unilateral primary osteoarthritis, right knee: Secondary | ICD-10-CM | POA: Diagnosis present

## 2018-08-29 DIAGNOSIS — Z853 Personal history of malignant neoplasm of breast: Secondary | ICD-10-CM | POA: Diagnosis not present

## 2018-08-29 DIAGNOSIS — E785 Hyperlipidemia, unspecified: Secondary | ICD-10-CM | POA: Diagnosis not present

## 2018-08-29 DIAGNOSIS — Z23 Encounter for immunization: Secondary | ICD-10-CM | POA: Diagnosis not present

## 2018-08-29 DIAGNOSIS — Z923 Personal history of irradiation: Secondary | ICD-10-CM | POA: Diagnosis not present

## 2018-08-29 LAB — BASIC METABOLIC PANEL
Anion gap: 9 (ref 5–15)
BUN: 8 mg/dL (ref 8–23)
CALCIUM: 7.8 mg/dL — AB (ref 8.9–10.3)
CO2: 19 mmol/L — AB (ref 22–32)
Chloride: 108 mmol/L (ref 98–111)
Creatinine, Ser: 0.51 mg/dL (ref 0.44–1.00)
GFR calc Af Amer: >60 mL/min (ref >60)
GLUCOSE: 73 mg/dL (ref 70–99)
Potassium: 4.6 mmol/L (ref 3.5–5.1)
Sodium: 136 mmol/L (ref 135–145)

## 2018-08-29 LAB — CBC
HCT: 32.8 % — ABNORMAL LOW (ref 36.0–46.0)
Hemoglobin: 10.5 g/dL — ABNORMAL LOW (ref 12.0–15.0)
MCH: 33 pg (ref 26.0–34.0)
MCHC: 32 g/dL (ref 30.0–36.0)
MCV: 103.1 fL — AB (ref 80.0–100.0)
PLATELETS: 167 10*3/uL (ref 150–400)
RBC: 3.18 MIL/uL — ABNORMAL LOW (ref 3.87–5.11)
RDW: 11.9 % (ref 11.5–15.5)
WBC: 8.9 10*3/uL (ref 4.0–10.5)
nRBC: 0 % (ref 0.0–0.2)

## 2018-08-29 MED ORDER — ASPIRIN 81 MG PO CHEW: 81.0000 mg | CHEWABLE_TABLET | Freq: Two times a day (BID) | ORAL | Status: DC

## 2018-08-29 MED ORDER — ONDANSETRON 4 MG PO TBDP: 4.0000 mg | ORAL_TABLET | Freq: Three times a day (TID) | ORAL | 0 refills | Status: DC | PRN

## 2018-08-29 MED ORDER — ACETAMINOPHEN 325 MG PO TABS: 650.0000 mg | ORAL_TABLET | Freq: Four times a day (QID) | ORAL | Status: DC

## 2018-08-29 MED ORDER — OXYCODONE HCL 5 MG PO TABS: 5.0000 mg | ORAL_TABLET | ORAL | 0 refills | Status: DC | PRN

## 2018-08-29 MED ORDER — METHOCARBAMOL 500 MG PO TABS: 500.0000 mg | ORAL_TABLET | Freq: Three times a day (TID) | ORAL | 0 refills | Status: DC | PRN

## 2018-08-29 NOTE — Discharge Summary (Signed)
[name], MD   Biagio Borg, PA-C [address] [place], [place]  [address]                             [phone]  PATIENT ID: [name]        MRN:  [id number]          DOB/AGE: February 14, 1946 / 72 y.o.    DISCHARGE SUMMARY  ADMISSION DATE:    08/28/2018 DISCHARGE DATE:   08/29/2018   ADMISSION DIAGNOSIS: RIGHT KNEE OSTEOARTHRITIS    DISCHARGE DIAGNOSIS:  RIGHT KNEE OSTEOARTHRITIS    ADDITIONAL DIAGNOSIS: Principal Problem:   Unilateral primary osteoarthritis, right knee Active Problems:   Total knee replacement status, right  Past Medical History:  Diagnosis Date  . Adenomatous colon polyp 03/07/2012  . Allergic rhinitis   . Arthritis    "knees" (08/28/2018)  . Breast cancer, right breast ([facility]) 05/20/13   "no chemo" (08/28/2018)  . Chronic lower back pain   . Diverticulosis   . Graves' disease    2011  . Graves' disease   . History of kidney stones   . HLD (hyperlipidemia)   . Hx of radiation therapy 08/13/13- 09/06/13   right breast 4250 cGy 17 sessions  . Hypertension   . IBS (irritable bowel syndrome)   . Lumbar herniated disc    L4-L5  . Osteopenia   . Personal history of radiation therapy   . PONV (postoperative nausea and vomiting)    "when I have general anesthetic" (08/28/2018)  . PVC (premature ventricular contraction)    Takes metoprolol PRN for  pvc's    PROCEDURE: Procedure(s): RIGHT TOTAL KNEE ARTHROPLASTY Right on 08/28/2018  CONSULTS: none    HISTORY: [name], 72 y.o. female, has a history of pain and functional disability in the right knee due to arthritis and has failed non-surgical conservative treatments for greater than 12 weeks to includeNSAID's and/or analgesics, corticosteriod injections, viscosupplementation injections, flexibility and strengthening excercises and activity modification.  Onset of symptoms was gradual, starting 10 years ago with gradually worsening course since that time. The patient noted no past  surgery on the right knee(s).  Patient currently rates pain in the right knee(s) at 8 out of 10 with activity. Patient has night pain, worsening of pain with activity and weight bearing, pain that interferes with activities of daily living and crepitus.  Patient has evidence of subchondral sclerosis, periarticular osteophytes, joint subluxation and joint space narrowing by imaging studies. There is no active infection.  HOSPITAL COURSE:  [name] is a 72 y.o. admitted on 08/28/2018 and found to have a diagnosis of RIGHT KNEE OSTEOARTHRITIS.  After appropriate laboratory studies were obtained  they were taken to the operating room on 08/28/2018 and underwent  Procedure(s): RIGHT TOTAL KNEE ARTHROPLASTY  .   They were given perioperative antibiotics:  Anti-infectives (From admission, onward)   Start     Dose/Rate Route Frequency Ordered Stop   08/28/18 1330  ceFAZolin (ANCEF) IVPB 2g/100 mL premix     2 g 200 mL/hr over 30 Minutes Intravenous Every 6 hours 08/28/18 1319 08/28/18 2057   08/28/18 0615  ceFAZolin (ANCEF) IVPB 2g/100 mL premix     2 g 200 mL/hr over 30 Minutes Intravenous On call to O.R. 08/28/18 3335 08/28/18 0806    .  Tolerated the procedure well.  Placed with a foley intraoperatively.    Toradol was given post op.  POD #1, allowed out of bed to a chair.  PT for ambulation and exercise program.  Foley D/C'd in morning.  IV saline locked.  O2 discontionued.      Pain control acceptable and the patient wanted to be discharged home.  The remainder of the hospital course was dedicated to ambulation and strengthening.   The patient was discharged on 1 Day Post-Op in  Stable condition.  Blood products given:none  DIAGNOSTIC STUDIES: Recent vital signs:  Patient Vitals for the past 24 hrs:  BP Temp Temp src Pulse Resp SpO2  08/29/18 1335 - 98 F (36.7 C) Axillary - - -  08/29/18 0917 124/69 - - 65 (!) 21 100 %  08/29/18 0917 - 97.9 F (36.6 C) Oral - - -  08/29/18 0453  (!) 157/65 97.7 F (36.5 C) Oral (!) 50 20 100 %  08/29/18 0452 (!) 157/65 97.7 F (36.5 C) Oral (!) 50 20 -  08/28/18 2203 108/68 98.2 F (36.8 C) Oral (!) 59 20 99 %       Recent laboratory studies: Recent Labs    08/29/18 0424  WBC 8.9  HGB 10.5*  HCT 32.8*  PLT 167   Recent Labs    08/29/18 0424  NA 136  K 4.6  CL 108  CO2 19*  BUN 8  CREATININE 0.51  GLUCOSE 73  CALCIUM 7.8*   Lab Results  Component Value Date   INR 1.05 08/17/2018     Recent Radiographic Studies :  Dg Chest 2 View  Result Date: 08/18/2018 CLINICAL DATA:  Preoperative exam prior to total knee replacement. EXAM: CHEST - 2 VIEW COMPARISON:  February 25, 2016 FINDINGS: Surgical clips project over the right lateral chest. The heart, hila, mediastinum, lungs, and pleura are unremarkable. No other acute abnormalities identified. IMPRESSION: No active cardiopulmonary disease. Electronically Signed   By: Dorise Bullion III M.D   On: 08/18/2018 12:03    DISCHARGE INSTRUCTIONS: Discharge Instructions    CPM   Complete by:  As directed    Continuous passive motion machine (CPM):      Use the CPM from 0 to 60 for 6-8 hours per day.      You may increase by 5-10 degrees per day.  You may break it up into 2 or 3 sessions per day.      Use CPM for 3-4  weeks or until you are told to stop.   Call MD / Call 911   Complete by:  As directed    If you experience chest pain or shortness of breath, CALL 911 and be transported to the hospital emergency room.  If you develope a fever above 101 F, pus (white drainage) or increased drainage or redness at the wound, or calf pain, call your surgeon's office.   Change dressing   Complete by:  As directed    DO NOT CHANGE YOUR DRESSING   Constipation Prevention   Complete by:  As directed    Drink plenty of fluids.  Prune juice may be helpful.  You may use a stool softener, such as Colace (over the counter) 100 mg twice a day.  Use MiraLax (over the counter) for  constipation as needed.   Diet general   Complete by:  As directed    Discharge instructions   Complete by:  As directed    [place] items at home which could result in a fall. This includes throw rugs or furniture  in walking pathways ICE to the affected joint every three hours while awake for 30 minutes at a time, for at least the first 3-5 days, and then as needed for pain and swelling.  Continue to use ice for pain and swelling. You may notice swelling that will progress down to the foot and ankle.  This is normal after surgery.  Elevate your leg when you are not up walking on it.   Continue to use the breathing machine you got in the hospital (incentive spirometer) which will help keep your temperature down.  It is common for your temperature to cycle up and down following surgery, especially at night when you are not up moving around and exerting yourself.  The breathing machine keeps your lungs expanded and your temperature down.   DIET:  As you were doing prior to hospitalization, we recommend a well-balanced diet.  DRESSING / WOUND CARE / SHOWERING  Keep the surgical dressing until follow up.  The dressing is water proof, so you can shower without any extra covering.  IF THE DRESSING FALLS OFF or the wound gets wet inside, change the dressing with sterile gauze.  Please use good hand washing techniques before changing the dressing.  Do not use any lotions or creams on the incision until instructed by your surgeon.    ACTIVITY  Increase activity slowly as tolerated, but follow the weight bearing instructions below.   No driving for 6 weeks or until further direction given by your physician.  You cannot drive while taking narcotics.  No lifting or carrying greater than 10 lbs. until further directed by your surgeon. Avoid periods of inactivity such as sitting longer than an hour when not asleep. This helps prevent blood clots.  You may return to work  once you are authorized by your doctor.     WEIGHT BEARING   Partial weight bearing with assist device as directed.  50%   EXERCISES  Results after joint replacement surgery are often greatly improved when you follow the exercise, range of motion and muscle strengthening exercises prescribed by your doctor. Safety measures are also important to protect the joint from further injury. Any time any of these exercises cause you to have increased pain or swelling, decrease what you are doing until you are comfortable again and then slowly increase them. If you have problems or questions, call your caregiver or physical therapist for advice.   Rehabilitation is important following a joint replacement. After just a few days of immobilization, the muscles of the leg can become weakened and shrink (atrophy).  These exercises are designed to build up the tone and strength of the thigh and leg muscles and to improve motion. Often times heat used for twenty to thirty minutes before working out will loosen up your tissues and help with improving the range of motion but do not use heat for the first two weeks following surgery (sometimes heat can increase post-operative swelling).   These exercises can be done on a training (exercise) mat, on the floor, on a table or on a bed. Use whatever works the best and is most comfortable for you.    Use music or television while you are exercising so that the exercises are a pleasant break in your day. This will make your life better with the exercises acting as a break in your routine that you can look forward to.   Perform all exercises about fifteen times, three times per day or as directed.  You  should exercise both the operative leg and the other leg as well.   Exercises include:  Quad Sets - Tighten up the muscle on the front of the thigh (Quad) and hold for 5-10 seconds.   Straight Leg Raises - With your knee straight (if you were given a brace, keep it on), lift  the leg to 60 degrees, hold for 3 seconds, and slowly lower the leg.  Perform this exercise against resistance later as your leg gets stronger.  Leg Slides: Lying on your back, slowly slide your foot toward your buttocks, bending your knee up off the floor (only go as far as is comfortable). Then slowly slide your foot back down until your leg is flat on the floor again.  Angel Wings: Lying on your back spread your legs to the side as far apart as you can without causing discomfort.  Hamstring Strength:  Lying on your back, push your heel against the floor with your leg straight by tightening up the muscles of your buttocks.  Repeat, but this time bend your knee to a comfortable angle, and push your heel against the floor.  You may put a pillow under the heel to make it more comfortable if necessary.   A rehabilitation program following joint replacement surgery can speed recovery and prevent re-injury in the future due to weakened muscles. Contact your doctor or a physical therapist for more information on knee rehabilitation.    CONSTIPATION  Constipation is defined medically as fewer than three stools per week and severe constipation as less than one stool per week.  Even if you have a regular bowel pattern at home, your normal regimen is likely to be disrupted due to multiple reasons following surgery.  Combination of anesthesia, postoperative narcotics, change in appetite and fluid intake all can affect your bowels.   YOU MUST use at least one of the following options; they are listed in order of increasing strength to get the job done.  They are all available over the counter, and you may need to use some, POSSIBLY even all of these options:    Drink plenty of fluids (prune juice may be helpful) and high fiber foods Colace 100 mg by mouth twice a day  Senokot for constipation as directed and as needed Dulcolax (bisacodyl), take with full glass of water  Miralax (polyethylene glycol) once or  twice a day as needed.  If you have tried all these things and are unable to have a bowel movement in the first 3-4 days after surgery call either your surgeon or your primary doctor.    If you experience loose stools or diarrhea, hold the medications until you stool forms back up.  If your symptoms do not get better within 1 week or if they get worse, check with your doctor.  If you experience "the worst abdominal pain ever" or develop nausea or vomiting, please contact the office immediately for further recommendations for treatment.   ITCHING:  If you experience itching with your medications, try taking only a single pain pill, or even half a pain pill at a time.  You can also use Benadryl over the counter for itching or also to help with sleep.   TED HOSE STOCKINGS:  Use stockings on both legs until for at least 2 weeks or as directed by physician office. They may be removed at night for sleeping.  MEDICATIONS:  See your medication summary on the "After Visit Summary" that nursing will review with you.  You may have some home medications which will be placed on hold until you complete the course of blood thinner medication.  It is important for you to complete the blood thinner medication as prescribed.  PRECAUTIONS:  If you experience chest pain or shortness of breath - call 911 immediately for transfer to the hospital emergency department.   If you develop a fever greater that 101 F, purulent drainage from wound, increased redness or drainage from wound, foul odor from the wound/dressing, or calf pain - CONTACT YOUR SURGEON.                                                   FOLLOW-UP APPOINTMENTS:  If you do not already have a post-op appointment, please call the office for an appointment to be seen by your surgeon.  Guidelines for how soon to be seen are listed in your "After Visit Summary", but are typically between 1-4 weeks after surgery.  OTHER INSTRUCTIONS:   Knee Replacement:  Do  not place pillow under knee, focus on keeping the knee straight while resting. CPM instructions: 0-90 degrees, 2 hours in the morning, 2 hours in the afternoon, and 2 hours in the evening. Place foam block, curve side up under heel at all times except when in CPM or when walking.  DO NOT modify, tear, cut, or change the foam block in any way.  MAKE SURE YOU:  Understand these instructions.  Get help right away if you are not doing well or get worse.    Thank you for letting us be a part of your medical care team.  It is a privilege we respect greatly.  We hope these instructions will help you stay on track for a fast and full recovery!   Do not put a pillow under the knee. Place it under the heel.   Complete by:  As directed    Driving restrictions   Complete by:  As directed    No driving for 6 weeks   Face-to-face encounter (required for Medicare/Medicaid patients)   Complete by:  As directed    I Biagio Borg certify that this patient is under my care and that I, or a nurse practitioner or physician's assistant working with me, had a face-to-face encounter that meets the physician face-to-face encounter requirements with this patient on 08/29/2018. The encounter with the patient was in whole, or in part for the following medical condition(s) which is the primary reason for home health care (List medical condition): s/p right total knee replacement   The encounter with the patient was in whole, or in part, for the following medical condition, which is the primary reason for home health care:  s/p right total hip replacement   I certify that, based on my findings, the following services are medically necessary home health services:  Physical therapy   Reason for Medically Necessary Home Health Services:  Therapy- Therapeutic Exercises to Increase Strength and Endurance   My clinical findings support the need for the above services:  Unable to leave home safely without assistance and/or assistive  device   Further, I certify that my clinical findings support that this patient is homebound due to:  Pain interferes with ambulation/mobility   Home Health   Complete by:  As directed    To provide the following care/treatments:  PT  Increase activity slowly as tolerated   Complete by:  As directed    Lifting restrictions   Complete by:  As directed    No lifting for 6 weeks   Partial weight bearing   Complete by:  As directed    % Body Weight:  50%   Laterality:  right   Extremity:  Lower   Patient may shower   Complete by:  As directed    You may shower over the brown dressing   TED hose   Complete by:  As directed    Use stockings (TED hose) for 2-3 weeks on right leg.  You may remove them at night for sleeping.      DISCHARGE MEDICATIONS:   Allergies as of 08/29/2018      Reactions   Dilaudid [hydromorphone Hcl] Nausea And Vomiting   Terak [terramycin] Rash   Amoxicillin Diarrhea   Has patient had a PCN reaction causing immediate rash, facial/tongue/throat swelling, SOB or lightheadedness with hypotension: No Has patient had a PCN reaction causing severe rash involving mucus membranes or skin necrosis: No Has patient had a PCN reaction that required hospitalization: No Has patient had a PCN reaction occurring within the last 10 years: Unknown If all of the above answers are "NO", then may proceed with Cephalosporin use.      Medication List    STOP taking these medications   ADVIL 200 MG tablet Generic drug:  ibuprofen   Echinacea 350 MG Caps   meloxicam 15 MG tablet Commonly known as:  MOBIC   metoprolol succinate 25 MG 24 hr tablet Commonly known as:  TOPROL-XL   Turmeric 1053 MG Tabs     TAKE these medications   acetaminophen 325 MG tablet Commonly known as:  TYLENOL Take 2 tablets (650 mg total) by mouth every 6 (six) hours.   aspirin 81 MG chewable tablet Chew 1 tablet (81 mg total) by mouth 2 (two) times daily.   buPROPion 300 MG 24 hr  tablet Commonly known as:  WELLBUTRIN XL Take 300 mg by mouth daily.   CALCIUM 1000 + D 1000-800 MG-UNIT Tabs Generic drug:  Calcium Carb-Cholecalciferol Take 1 tablet by mouth daily.   fluticasone 50 MCG/ACT nasal spray Commonly known as:  FLONASE Place 2 sprays into the nose daily.   LIPITOR 10 MG tablet Generic drug:  atorvastatin Take 10 mg by mouth every evening.   lisinopril 10 MG tablet Commonly known as:  PRINIVIL,ZESTRIL Take 1 tablet (10 mg total) by mouth daily. NEED OV. What changed:  when to take this   methocarbamol 500 MG tablet Commonly known as:  ROBAXIN Take 1 tablet (500 mg total) by mouth every 8 (eight) hours as needed for muscle spasms.   metoprolol tartrate 25 MG tablet Commonly known as:  LOPRESSOR Take 25 mg 1 hour before Cardiac CT What changed:    how much to take  how to take this  when to take this  reasons to take this  additional instructions   multivitamin tablet Take 1 tablet by mouth daily.   ondansetron 4 MG disintegrating tablet Commonly known as:  ZOFRAN-ODT Take 1 tablet (4 mg total) by mouth every 8 (eight) hours as needed for nausea or vomiting.   oxyCODONE 5 MG immediate release tablet Commonly known as:  Oxy IR/ROXICODONE Take 1-2 tablets (5-10 mg total) by mouth every 4 (four) hours as needed for moderate pain or severe pain (pain score 4-6).   SYNTHROID 88 MCG tablet Generic  drug:  levothyroxine Take 88 mcg by mouth every other day.   SYNTHROID 100 MCG tablet Generic drug:  levothyroxine Take 100 mcg by mouth every other day.   tamoxifen 20 MG tablet Commonly known as:  NOLVADEX Take 1 tablet (20 mg total) by mouth daily.   zolpidem 10 MG tablet Commonly known as:  AMBIEN Take 10 mg by mouth at bedtime as needed for sleep.            Durable Medical Equipment  (From admission, onward)         Start     Ordered   08/28/18 1320  DME Walker rolling  Once    Question:  Patient needs a walker to  treat with the following condition  Answer:  Status post total knee replacement using cement, right   08/28/18 1319   08/28/18 1320  DME 3 n 1  Once     08/28/18 1319   08/28/18 1320  DME Bedside commode  Once    Question:  Patient needs a bedside commode to treat with the following condition  Answer:  S/P TKR (total knee replacement) using cement, right   08/28/18 1319           Discharge Care Instructions  (From admission, onward)         Start     Ordered   08/29/18 0000  Partial weight bearing    Question Answer Comment  % Body Weight 50%   Laterality right   Extremity Lower      08/29/18 1436   08/29/18 0000  Change dressing    Comments:  DO NOT CHANGE YOUR DRESSING   08/29/18 1436          FOLLOW UP VISIT:   Follow-up Information    [facility].   Specialty:  [place] Why:  A representative from [facility] will contact you to arrange start date and time for your therapy. Contact information: [place] [address] [phone]        Garald Balding, MD Follow up on 09/10/2018.   Specialty:  Orthopedic Surgery Contact information: [address] [place] [address] [phone]           DISPOSITION:   Home  CONDITION:  Stable   Mike Craze. [place], [place] [phone]  08/29/2018 2:43 PM

## 2018-08-29 NOTE — Progress Notes (Signed)
PATIENT ID: [name]        MRN:  [id number]          DOB/AGE: 06/05/1946 / 72 y.o.    Michelle Fears, MD   Biagio Borg, PA-C [address] [place], [place]  [address]                             [phone]   PROGRESS NOTE  Subjective:  negative for Chest Pain  negative for Shortness of Breath  negative for Nausea/Vomiting   negative for Calf Pain    Tolerating Diet: yes         Patient reports pain as mild and moderate.     Presently comfortable without complaints  Objective: Vital signs in last 24 hours:    Patient Vitals for the past 24 hrs:  BP Temp Temp src Pulse Resp SpO2  08/29/18 0453 (!) 157/65 97.7 F (36.5 C) Oral (!) 50 20 100 %  08/29/18 0452 (!) 157/65 97.7 F (36.5 C) Oral (!) 50 20 -  08/28/18 2203 108/68 98.2 F (36.8 C) Oral (!) 59 20 99 %  08/28/18 1305 136/79 97.8 F (36.6 C) Oral (!) 53 20 97 %  08/28/18 1230 (!) 150/79 - - (!) 55 16 100 %  08/28/18 1111 123/77 - - (!) 58 14 100 %  08/28/18 1057 122/71 - - 63 15 100 %  08/28/18 1041 114/84 - - - - -  08/28/18 1040 - - - (!) 57 17 98 %  08/28/18 1026 101/64 - - (!) 52 16 99 %  08/28/18 1011 114/68 - - (!) 56 17 100 %  08/28/18 0958 - - - 63 14 100 %  08/28/18 0956 110/70 (!) 97 F (36.1 C) - - 14 -      Intake/Output from previous day:   10/08 0701 - 10/09 0700 In: 1100 [P.O.:200; I.V.:900] Out: 7253 [Urine:1345]   Intake/Output this shift:   No intake/output data recorded.   Intake/Output      10/08 0701 - 10/09 0700 10/09 0701 - 10/10 0700   P.O. 200    I.V. 900    Total Intake 1100    Urine 1345    Blood 100    Total Output 1445    Net -345            LABORATORY DATA: Recent Labs    08/29/18 0424  WBC 8.9  HGB 10.5*  HCT 32.8*  PLT 167   Recent Labs    08/29/18 0424  NA 136  K 4.6  CL 108  CO2 19*  BUN 8  CREATININE 0.51  GLUCOSE 73  CALCIUM 7.8*   Lab Results  Component Value Date   INR 1.05 08/17/2018    Recent Radiographic Studies :  Dg  Chest 2 View  Result Date: 08/18/2018 CLINICAL DATA:  Preoperative exam prior to total knee replacement. EXAM: CHEST - 2 VIEW COMPARISON:  February 25, 2016 FINDINGS: Surgical clips project over the right lateral chest. The heart, hila, mediastinum, lungs, and pleura are unremarkable. No other acute abnormalities identified. IMPRESSION: No active cardiopulmonary disease. Electronically Signed   By: Dorise Bullion III M.D   On: 08/18/2018 12:03     Examination:  General appearance: alert, cooperative and no distress  Wound Exam: clean, dry, intact   Drainage:  None: wound tissue dry  Motor Exam: EHL, FHL, Anterior Tibial and Posterior Tibial Intact  Sensory  Exam: Superficial Peroneal, Deep Peroneal and Tibial normal  Vascular Exam: Normal  Assessment:    1 Day Post-Op  Procedure(s) (LRB): RIGHT TOTAL KNEE ARTHROPLASTY (Right)  ADDITIONAL DIAGNOSIS:  Principal Problem:   Unilateral primary osteoarthritis, right knee Active Problems:   Total knee replacement status, right  Acute Blood Loss Anemia -asymptomatic, monitor  Plan: Physical Therapy as ordered Partial Weight Bearing @ 50% (PWB)  DVT Prophylaxis:  Aspirin, Foot Pumps and TED hose  DISCHARGE PLAN: Home  DISCHARGE NEEDS: HHPT, CPM, Walker and 3-in-1 comode seat  Anticipated LOS equal to or greater than 2 midnights due to - Age 71 and older with one or more of the following:  - Obesity  - Expected need for hospital services (PT, OT, Nursing) required for safe  discharge  - Anticipated need for postoperative skilled nursing care or inpatient rehab  - Active co-morbidities: None OR   - Unanticipated findings during/Post Surgery: None  - Patient is a high risk of re-admission due to: None        Biagio Borg, PA-C [place]  08/29/2018 7:44 AM  Patient ID: [name], female   DOB: 1946/05/14, 72 y.o.   MRN: [id number]

## 2018-08-29 NOTE — Progress Notes (Signed)
Physical Therapy Treatment Patient Details Name: [name] MRN: [id number] DOB: [birth year] Today's Date: 08/29/2018    History of Present Illness Pt presents for R TKA with PMH: OA, h/o breast cancer, Graves disease     PT Comments    Patient seen for mobility progression. Pt tolerated increased distance of 243ft with RW and supervision and able to negotiate steps simulating home entrance with min A. Continue to progress as tolerated.    Follow Up Recommendations  Follow surgeon's recommendation for DC plan and follow-up therapies     Equipment Recommendations  Rolling walker with 5" wheels    Recommendations for Other Services       Precautions / Restrictions Precautions Precautions: Knee Precaution Comments: reviewed proper positioning of knee and precautions Restrictions Weight Bearing Restrictions: Yes RLE Weight Bearing: Partial weight bearing RLE Partial Weight Bearing Percentage or Pounds: 50    Mobility  Bed Mobility               General bed mobility comments: pt up in bathroom upon arrival   Transfers Overall transfer level: Needs assistance Equipment used: Rolling walker (2 wheeled) Transfers: Sit to/from Stand Sit to Stand: Min guard         General transfer comment: safe hand placement demonstrated  Ambulation/Gait Ambulation/Gait assistance: Supervision Gait Distance (Feet): 200 Feet Assistive device: Rolling walker (2 wheeled) Gait Pattern/deviations: Step-through pattern;Decreased weight shift to right;Decreased stance time - right Gait velocity: decreased   General Gait Details: cues for sequencing, positioning in RW, and R heel strike   Stairs Stairs: Yes Stairs assistance: Min assist Stair Management: No rails;Step to pattern;Backwards;With walker Number of Stairs: 2 General stair comments: cues for sequencing and technique; assit to stabilize RW   Wheelchair Mobility    Modified Rankin (Stroke Patients Only)        Balance Overall balance assessment: No apparent balance deficits (not formally assessed)                                          Cognition Arousal/Alertness: Awake/alert Behavior During Therapy: WFL for tasks assessed/performed Overall Cognitive Status: Within Functional Limits for tasks assessed                                        Exercises      General Comments General comments (skin integrity, edema, etc.): pt with c/o feeling hot and required seated rest breaks during session      Pertinent Vitals/Pain Pain Assessment: Faces Faces Pain Scale: Hurts little more Pain Location: R knee Pain Descriptors / Indicators: Sore;Guarding Pain Intervention(s): Monitored during session;Repositioned;Premedicated before session    Home Living                      Prior Function            PT Goals (current goals can now be found in the care plan section) Acute Rehab PT Goals Patient Stated Goal: return home  Progress towards PT goals: Progressing toward goals    Frequency    7X/week      PT Plan Current plan remains appropriate    Co-evaluation              AM-PAC PT "6 Clicks" Daily Activity  Outcome Measure  Difficulty turning over in bed (including adjusting bedclothes, sheets and blankets)?: A Little Difficulty moving from lying on back to sitting on the side of the bed? : A Little Difficulty sitting down on and standing up from a chair with arms (e.g., wheelchair, bedside commode, etc,.)?: A Little Help needed moving to and from a bed to chair (including a wheelchair)?: A Little Help needed walking in hospital room?: A Little Help needed climbing 3-5 steps with a railing? : A Little 6 Click Score: 18    End of Session Equipment Utilized During Treatment: Gait belt Activity Tolerance: Patient tolerated treatment well Patient left: in CPM;in bed;with call bell/phone within reach Nurse Communication: Mobility  status PT Visit Diagnosis: Pain;Difficulty in walking, not elsewhere classified (R26.2) Pain - Right/Left: Right Pain - part of body: Knee     Time: 2725-3664 PT Time Calculation (min) (ACUTE ONLY): 24 min  Charges:  $Gait Training: 8-22 mins $Therapeutic Activity: 8-22 mins                     Earney Navy, PTA Acute Rehabilitation Services Pager: [phone] Office: [phone]     Darliss Cheney 08/29/2018, 12:34 PM

## 2018-08-29 NOTE — Care Management Note (Signed)
Case Management Note  Patient Details  Name: AMRITHA YORKE MRN: [id number] Date of Birth: June 29, 1946  Subjective/Objective: 72 yr old female s/p right total knee arthroplasty.                  Action/Plan: Case manager spoke with patient concerning discharge plan and DME. Patient was preoperatively setup with [facility] chooses to use [place] based on her relationship with them . Referral was called to [place], [place] Liaison. Patient has 3in1, will need RW. Will have family support at discharge.    Expected Discharge Date:  08/29/18               Expected Discharge Plan:  [place]  In-House Referral:  NA  Discharge planning Services  CM Consult  Post Acute Care Choice:  Home Health, Durable Medical Equipment Choice offered to:  Patient  DME Arranged:  Walker rolling DME Agency:  [place]:  PT [facility] Agency:  [place]  Status of Service:  Completed, signed off  If discussed at [place] of Stay Meetings, dates discussed:    Additional Comments:  Ninfa Meeker, RN 08/29/2018, 2:38 PM

## 2018-08-29 NOTE — Progress Notes (Signed)
Written and verbal discharge instructions given to the patient.  The patient verbalizes understanding those instructions.  The patient was discharged to the home via wheel chair.

## 2018-08-30 ENCOUNTER — Ambulatory Visit: Payer: Medicare Other | Admitting: Hematology and Oncology

## 2018-09-03 DIAGNOSIS — M25661 Stiffness of right knee, not elsewhere classified: Secondary | ICD-10-CM | POA: Diagnosis not present

## 2018-09-03 DIAGNOSIS — R262 Difficulty in walking, not elsewhere classified: Secondary | ICD-10-CM | POA: Diagnosis not present

## 2018-09-03 DIAGNOSIS — Z96651 Presence of right artificial knee joint: Secondary | ICD-10-CM | POA: Diagnosis not present

## 2018-09-05 ENCOUNTER — Telehealth (INDEPENDENT_AMBULATORY_CARE_PROVIDER_SITE_OTHER): Payer: Self-pay | Admitting: Orthopaedic Surgery

## 2018-09-05 NOTE — Telephone Encounter (Signed)
Patient left a voicemail stating she had surgery last week and today woke up with a lot of bleeding and bruising around knee and leg.  Patient requested a return call.

## 2018-09-05 NOTE — Telephone Encounter (Signed)
Brian called patient. 

## 2018-09-07 DIAGNOSIS — M25661 Stiffness of right knee, not elsewhere classified: Secondary | ICD-10-CM | POA: Diagnosis not present

## 2018-09-07 DIAGNOSIS — Z96651 Presence of right artificial knee joint: Secondary | ICD-10-CM | POA: Diagnosis not present

## 2018-09-07 DIAGNOSIS — R262 Difficulty in walking, not elsewhere classified: Secondary | ICD-10-CM | POA: Diagnosis not present

## 2018-09-10 ENCOUNTER — Telehealth (INDEPENDENT_AMBULATORY_CARE_PROVIDER_SITE_OTHER): Payer: Self-pay | Admitting: Orthopaedic Surgery

## 2018-09-10 DIAGNOSIS — B349 Viral infection, unspecified: Secondary | ICD-10-CM | POA: Diagnosis not present

## 2018-09-10 NOTE — Telephone Encounter (Signed)
Patient called stating she had right knee surgery 2 weeks ago and now is experiencing chest congestion, coughing, and weakness.  Patient states she is taking Mucinex.  Patient requested a return call.

## 2018-09-10 NOTE — Telephone Encounter (Signed)
NOTIFIED PT TO FOLLOW UP WITH PCP TO BE EVALUATED

## 2018-09-11 ENCOUNTER — Encounter (INDEPENDENT_AMBULATORY_CARE_PROVIDER_SITE_OTHER): Payer: Self-pay | Admitting: Orthopaedic Surgery

## 2018-09-11 DIAGNOSIS — R262 Difficulty in walking, not elsewhere classified: Secondary | ICD-10-CM | POA: Diagnosis not present

## 2018-09-11 DIAGNOSIS — M25661 Stiffness of right knee, not elsewhere classified: Secondary | ICD-10-CM | POA: Diagnosis not present

## 2018-09-11 DIAGNOSIS — Z96651 Presence of right artificial knee joint: Secondary | ICD-10-CM | POA: Diagnosis not present

## 2018-09-13 DIAGNOSIS — M25661 Stiffness of right knee, not elsewhere classified: Secondary | ICD-10-CM | POA: Diagnosis not present

## 2018-09-13 DIAGNOSIS — R262 Difficulty in walking, not elsewhere classified: Secondary | ICD-10-CM | POA: Diagnosis not present

## 2018-09-13 DIAGNOSIS — Z96651 Presence of right artificial knee joint: Secondary | ICD-10-CM | POA: Diagnosis not present

## 2018-09-14 ENCOUNTER — Ambulatory Visit (INDEPENDENT_AMBULATORY_CARE_PROVIDER_SITE_OTHER): Payer: Medicare Other

## 2018-09-14 ENCOUNTER — Encounter (INDEPENDENT_AMBULATORY_CARE_PROVIDER_SITE_OTHER): Payer: Self-pay | Admitting: Orthopaedic Surgery

## 2018-09-14 ENCOUNTER — Ambulatory Visit (INDEPENDENT_AMBULATORY_CARE_PROVIDER_SITE_OTHER): Payer: Medicare Other | Admitting: Orthopaedic Surgery

## 2018-09-14 VITALS — BP 124/83 | HR 71 | Ht 65.25 in | Wt 126.0 lb

## 2018-09-14 DIAGNOSIS — Z96651 Presence of right artificial knee joint: Secondary | ICD-10-CM

## 2018-09-14 NOTE — Progress Notes (Signed)
Office Visit Note   Patient: [name]           Date of Birth: [birth year]           MRN: [id number] Visit Date: 09/14/2018              Requested by: Hulan Fess, MD [place], [place] [id number] PCP: Hulan Fess, MD   Assessment & Plan: Visit Diagnoses:  1. History of total right knee replacement     Plan: 2 weeks status post primary right total knee replacement. doing very well.  Remove the staples applied Steri-Strips.  No complications.  Urged to continue exercises.  Reevaluate in 2 weeks  Follow-Up Instructions: Return in about 2 weeks (around 09/28/2018).   Orders:  No orders of the defined types were placed in this encounter.  No orders of the defined types were placed in this encounter.     Procedures: No procedures performed   Clinical Data: No additional findings.   Subjective: Chief Complaint  Patient presents with  . Follow-up    08/28/18 R TKR SURGERY GOING WELL, HAS SOME PAIN NAD SWELLING  Doing very well at present.  Has not taken any pain medicines 2 weeks.  Using a combination of NSAIDs and Tylenol.  Uses a cane for ambulation.  Continues with home exercises.  HPI  Review of Systems  Constitutional: Positive for fatigue. Negative for fever.  HENT: Negative for ear pain.   Eyes: Negative for pain.  Respiratory: Positive for cough. Negative for shortness of breath.   Cardiovascular: Positive for leg swelling.  Gastrointestinal: Positive for constipation. Negative for diarrhea.  Genitourinary: Negative for difficulty urinating.  Musculoskeletal: Negative for back pain and neck pain.  Skin: Negative for rash.  Allergic/Immunologic: Negative for food allergies.  Neurological: Positive for weakness. Negative for numbness.  Hematological: Bruises/bleeds easily.  Psychiatric/Behavioral: Positive for sleep disturbance.     Objective: Vital Signs: BP 124/83 (BP Location: Left Arm, Patient Position: Sitting, Cuff Size:  Normal)   Pulse 71   Ht 5' 5.25" (1.657 m)   Wt 126 lb (57.2 kg)   BMI 20.81 kg/m   Physical Exam  Ortho Exam awake alert no x3.  Comfortable sitting.  Incision right knee healing without problem.  Clips removed and Steri-Strips applied.  No calf pain.  No distal edema.  Neurovascular exam intact.  Lacks just a few degrees to full extension and flexed over 90 degrees.  No instability.  Specialty Comments:  No specialty comments available.  Imaging: No results found.   PMFS History: Patient Active Problem List   Diagnosis Date Noted  . Unilateral primary osteoarthritis, right knee 08/28/2018  . History of total right knee replacement 08/28/2018  . Bilateral primary osteoarthritis of knee 03/12/2018  . Essential hypertension 08/26/2016  . Mitral valve disorder 08/26/2016  . History of palpitations 08/26/2016  . Dyslipidemia 08/26/2016  . Internal hemorrhoids 08/03/2015  . Rectal bleeding 08/03/2015  . Hx of radiation therapy   . Breast cancer of upper-outer quadrant of right female breast ([place]) 05/23/2013  . Personal history of colonic polyps 01/06/2012  . Irritable bowel syndrome - constipation predominant 01/06/2012   Past Medical History:  Diagnosis Date  . Adenomatous colon polyp 03/07/2012  . Allergic rhinitis   . Arthritis    "knees" (08/28/2018)  . Breast cancer, right breast ([place]) 05/20/13   "no chemo" (08/28/2018)  . Chronic lower back pain   . Diverticulosis   . Graves' disease  2011  Berenice Primas' disease   . History of kidney stones   . HLD (hyperlipidemia)   . Hx of radiation therapy 08/13/13- 09/06/13   right breast 4250 cGy 17 sessions  . Hypertension   . IBS (irritable bowel syndrome)   . Lumbar herniated disc    L4-L5  . Osteopenia   . Personal history of radiation therapy   . PONV (postoperative nausea and vomiting)    "when I have general anesthetic" (08/28/2018)  . PVC (premature ventricular contraction)    Takes metoprolol PRN for  pvc's      Family History  Problem Relation Age of Onset  . Heart disease Mother 19       MI  . Lung cancer Mother   . Hypertension Mother   . Alcohol abuse Father   . COPD Father   . Colon cancer Maternal Grandfather 53  . Diabetes Paternal Grandmother   . Breast cancer Maternal Aunt   . Pancreatic cancer Maternal Aunt   . Stomach cancer Neg Hx     Past Surgical History:  Procedure Laterality Date  . BREAST BIOPSY Right 2014  . BREAST LUMPECTOMY WITH NEEDLE LOCALIZATION AND AXILLARY SENTINEL LYMPH NODE BX Right 06/11/2013   Procedure: BREAST LUMPECTOMY WITH NEEDLE LOCALIZATION AND AXILLARY SENTINEL LYMPH NODE BX;  Surgeon: Harl Bowie, MD;  Location: [facility];  Service: General;  Laterality: Right;  Needle localization BCG 7:30  . COLONOSCOPY W/ BIOPSIES AND POLYPECTOMY  multiple  . JOINT REPLACEMENT    . KNEE ARTHROSCOPY Left 2006  . TOTAL KNEE ARTHROPLASTY Right 08/28/2018  . TOTAL KNEE ARTHROPLASTY Right 08/28/2018   Procedure: RIGHT TOTAL KNEE ARTHROPLASTY;  Surgeon: Garald Balding, MD;  Location: [facility];  Service: Orthopedics;  Laterality: Right;   Social History   Occupational History  . Occupation: Programmer, multimedia: UNEMPLOYED  Tobacco Use  . Smoking status: Never Smoker  . Smokeless tobacco: Never Used  Substance and Sexual Activity  . Alcohol use: Yes    Alcohol/week: 0.0 standard drinks    Comment: 08/28/2018 "1-2 drinks/month"  . Drug use: Never  . Sexual activity: Not Currently    Comment: menarche 64, 30 1 age 60, menopause 48, HRT x 9 yrs

## 2018-09-17 DIAGNOSIS — M25661 Stiffness of right knee, not elsewhere classified: Secondary | ICD-10-CM | POA: Diagnosis not present

## 2018-09-17 DIAGNOSIS — Z96651 Presence of right artificial knee joint: Secondary | ICD-10-CM | POA: Diagnosis not present

## 2018-09-17 DIAGNOSIS — R262 Difficulty in walking, not elsewhere classified: Secondary | ICD-10-CM | POA: Diagnosis not present

## 2018-09-18 DIAGNOSIS — J181 Lobar pneumonia, unspecified organism: Secondary | ICD-10-CM | POA: Diagnosis not present

## 2018-09-18 DIAGNOSIS — R0602 Shortness of breath: Secondary | ICD-10-CM | POA: Diagnosis not present

## 2018-09-20 DIAGNOSIS — R7989 Other specified abnormal findings of blood chemistry: Secondary | ICD-10-CM | POA: Diagnosis not present

## 2018-09-21 DIAGNOSIS — M25661 Stiffness of right knee, not elsewhere classified: Secondary | ICD-10-CM | POA: Diagnosis not present

## 2018-09-21 DIAGNOSIS — R262 Difficulty in walking, not elsewhere classified: Secondary | ICD-10-CM | POA: Diagnosis not present

## 2018-09-21 DIAGNOSIS — Z96651 Presence of right artificial knee joint: Secondary | ICD-10-CM | POA: Diagnosis not present

## 2018-09-24 DIAGNOSIS — R262 Difficulty in walking, not elsewhere classified: Secondary | ICD-10-CM | POA: Diagnosis not present

## 2018-09-24 DIAGNOSIS — M25661 Stiffness of right knee, not elsewhere classified: Secondary | ICD-10-CM | POA: Diagnosis not present

## 2018-09-24 DIAGNOSIS — Z96651 Presence of right artificial knee joint: Secondary | ICD-10-CM | POA: Diagnosis not present

## 2018-09-26 DIAGNOSIS — J90 Pleural effusion, not elsewhere classified: Secondary | ICD-10-CM | POA: Diagnosis not present

## 2018-09-26 DIAGNOSIS — R262 Difficulty in walking, not elsewhere classified: Secondary | ICD-10-CM | POA: Diagnosis not present

## 2018-09-26 DIAGNOSIS — M25661 Stiffness of right knee, not elsewhere classified: Secondary | ICD-10-CM | POA: Diagnosis not present

## 2018-09-26 DIAGNOSIS — Z96651 Presence of right artificial knee joint: Secondary | ICD-10-CM | POA: Diagnosis not present

## 2018-09-26 DIAGNOSIS — R079 Chest pain, unspecified: Secondary | ICD-10-CM | POA: Diagnosis not present

## 2018-09-26 DIAGNOSIS — J181 Lobar pneumonia, unspecified organism: Secondary | ICD-10-CM | POA: Diagnosis not present

## 2018-09-28 ENCOUNTER — Encounter (INDEPENDENT_AMBULATORY_CARE_PROVIDER_SITE_OTHER): Payer: Self-pay | Admitting: Orthopaedic Surgery

## 2018-09-28 ENCOUNTER — Ambulatory Visit (INDEPENDENT_AMBULATORY_CARE_PROVIDER_SITE_OTHER): Payer: Medicare Other | Admitting: Orthopaedic Surgery

## 2018-09-28 VITALS — BP 97/65 | HR 77 | Ht 65.25 in | Wt 126.0 lb

## 2018-09-28 DIAGNOSIS — Z96651 Presence of right artificial knee joint: Secondary | ICD-10-CM

## 2018-09-28 NOTE — Progress Notes (Deleted)
Office Visit Note   Patient: [name]           Date of Birth: [birth year]           MRN: [id number] Visit Date: 09/28/2018              Requested by: Hulan Fess, MD [place], [place] [address] PCP: Hulan Fess, MD   Assessment & Plan: Visit Diagnoses: No diagnosis found.  Plan: ***  Follow-Up Instructions: No follow-ups on file.   Orders:  No orders of the defined types were placed in this encounter.  No orders of the defined types were placed in this encounter.     Procedures: No procedures performed   Clinical Data: No additional findings.   Subjective: No chief complaint on file.   HPI  Review of Systems  Constitutional: Positive for fatigue.  HENT: Negative for ear pain.   Eyes: Negative for pain.  Respiratory: Positive for shortness of breath. Negative for cough.   Cardiovascular: Negative for leg swelling.  Gastrointestinal: Negative for constipation and diarrhea.  Genitourinary: Negative for difficulty urinating.  Musculoskeletal: Negative for back pain and neck pain.  Skin: Negative for rash.  Allergic/Immunologic: Negative for food allergies.  Neurological: Positive for weakness. Negative for numbness.  Hematological: Does not bruise/bleed easily.  Psychiatric/Behavioral: Positive for sleep disturbance.     Objective: Vital Signs: There were no vitals taken for this visit.  Physical Exam  Ortho Exam  Specialty Comments:  No specialty comments available.  Imaging: No results found.   PMFS History: Patient Active Problem List   Diagnosis Date Noted  . Unilateral primary osteoarthritis, right knee 08/28/2018  . History of total right knee replacement 08/28/2018  . Bilateral primary osteoarthritis of knee 03/12/2018  . Essential hypertension 08/26/2016  . Mitral valve disorder 08/26/2016  . History of palpitations 08/26/2016  . Dyslipidemia 08/26/2016  . Internal hemorrhoids 08/03/2015  . Rectal  bleeding 08/03/2015  . Hx of radiation therapy   . Breast cancer of upper-outer quadrant of right female breast ([place]) 05/23/2013  . Personal history of colonic polyps 01/06/2012  . Irritable bowel syndrome - constipation predominant 01/06/2012   Past Medical History:  Diagnosis Date  . Adenomatous colon polyp 03/07/2012  . Allergic rhinitis   . Arthritis    "knees" (08/28/2018)  . Breast cancer, right breast ([place]) 05/20/13   "no chemo" (08/28/2018)  . Chronic lower back pain   . Diverticulosis   . Graves' disease    2011  . Graves' disease   . History of kidney stones   . HLD (hyperlipidemia)   . Hx of radiation therapy 08/13/13- 09/06/13   right breast 4250 cGy 17 sessions  . Hypertension   . IBS (irritable bowel syndrome)   . Lumbar herniated disc    L4-L5  . Osteopenia   . Personal history of radiation therapy   . PONV (postoperative nausea and vomiting)    "when I have general anesthetic" (08/28/2018)  . PVC (premature ventricular contraction)    Takes metoprolol PRN for  pvc's    Family History  Problem Relation Age of Onset  . Heart disease Mother 35       MI  . Lung cancer Mother   . Hypertension Mother   . Alcohol abuse Father   . COPD Father   . Colon cancer Maternal Grandfather 57  . Diabetes Paternal Grandmother   . Breast cancer Maternal Aunt   . Pancreatic cancer Maternal Aunt   .  Stomach cancer Neg Hx     Past Surgical History:  Procedure Laterality Date  . BREAST BIOPSY Right 2014  . BREAST LUMPECTOMY WITH NEEDLE LOCALIZATION AND AXILLARY SENTINEL LYMPH NODE BX Right 06/11/2013   Procedure: BREAST LUMPECTOMY WITH NEEDLE LOCALIZATION AND AXILLARY SENTINEL LYMPH NODE BX;  Surgeon: Harl Bowie, MD;  Location: [facility];  Service: General;  Laterality: Right;  Needle localization BCG 7:30  . COLONOSCOPY W/ BIOPSIES AND POLYPECTOMY  multiple  . JOINT REPLACEMENT    . KNEE ARTHROSCOPY Left 2006  . TOTAL KNEE ARTHROPLASTY Right 08/28/2018  . TOTAL KNEE  ARTHROPLASTY Right 08/28/2018   Procedure: RIGHT TOTAL KNEE ARTHROPLASTY;  Surgeon: Garald Balding, MD;  Location: [facility];  Service: Orthopedics;  Laterality: Right;   Social History   Occupational History  . Occupation: Programmer, multimedia: UNEMPLOYED  Tobacco Use  . Smoking status: Never Smoker  . Smokeless tobacco: Never Used  Substance and Sexual Activity  . Alcohol use: Yes    Alcohol/week: 0.0 standard drinks    Comment: 08/28/2018 "1-2 drinks/month"  . Drug use: Never  . Sexual activity: Not Currently    Comment: menarche 39, 7 1 age 53, menopause 35, HRT x 9 yrs

## 2018-09-28 NOTE — Progress Notes (Signed)
Office Visit Note   Patient: [name]           Date of Birth: 09-19-1946           MRN: [id number] Visit Date: 09/28/2018              Requested by: Hulan Fess, MD [place], [place] [address] PCP: Hulan Fess, MD   Assessment & Plan: Visit Diagnoses:  1. History of total right knee replacement     Plan: Doing very well with no issues referable to her right knee replacement.  Has developed pneumonia and is on Levaquin.  Continue with her exercises and therapy and check in 1 month  Follow-Up Instructions: Return in about 1 month (around 10/28/2018).   Orders:  No orders of the defined types were placed in this encounter.  No orders of the defined types were placed in this encounter.     Procedures: No procedures performed   Clinical Data: No additional findings.   Subjective: Chief Complaint  Patient presents with  . Follow-up    pt has develpoed right and left sided pneumonia, went to er had chest x rays and placed on meds, knee is doing great  Doing very well with her right total knee replacement.  Going to therapy and has gained 120 degrees of flexion 1 month postop.  Has developed pneumonia and is on Levaquin which limits her activities.  Return to work last week but because of her weakness related to the pneumonia has not returned after that one day  HPI  Review of Systems   Objective: Vital Signs: BP 97/65 (BP Location: Left Arm, Patient Position: Sitting, Cuff Size: Normal)   Pulse 77   Ht 5' 5.25" (1.657 m)   Wt 126 lb (57.2 kg)   BMI 20.81 kg/m   Physical Exam  Constitutional: She is oriented to person, place, and time. She appears well-developed and well-nourished.  HENT:  Mouth/Throat: Oropharynx is clear and moist.  Eyes: Pupils are equal, round, and reactive to light. EOM are normal.  Pulmonary/Chest: Effort normal.  Neurological: She is alert and oriented to person, place, and time.  Skin: Skin is warm and dry.    Psychiatric: She has a normal mood and affect. Her behavior is normal.    Ortho Exam awake alert and oriented x3.  Comfortable sitting.  Full right knee extension and flexion a little over 110 degrees.  No opening with varus valgus stress.  No calf pain.  No distal edema.  Neurovascular exam intact.  Incision healing without a problem.  Does not use any ambulatory aid Specialty Comments:  No specialty comments available.  Imaging: No results found.   PMFS History: Patient Active Problem List   Diagnosis Date Noted  . Unilateral primary osteoarthritis, right knee 08/28/2018  . History of total right knee replacement 08/28/2018  . Bilateral primary osteoarthritis of knee 03/12/2018  . Essential hypertension 08/26/2016  . Mitral valve disorder 08/26/2016  . History of palpitations 08/26/2016  . Dyslipidemia 08/26/2016  . Internal hemorrhoids 08/03/2015  . Rectal bleeding 08/03/2015  . Hx of radiation therapy   . Breast cancer of upper-outer quadrant of right female breast ([place]) 05/23/2013  . Personal history of colonic polyps 01/06/2012  . Irritable bowel syndrome - constipation predominant 01/06/2012   Past Medical History:  Diagnosis Date  . Adenomatous colon polyp 03/07/2012  . Allergic rhinitis   . Arthritis    "knees" (08/28/2018)  . Breast cancer, right  breast ([place]) 05/20/13   "no chemo" (08/28/2018)  . Chronic lower back pain   . Diverticulosis   . Graves' disease    2011  . Graves' disease   . History of kidney stones   . HLD (hyperlipidemia)   . Hx of radiation therapy 08/13/13- 09/06/13   right breast 4250 cGy 17 sessions  . Hypertension   . IBS (irritable bowel syndrome)   . Lumbar herniated disc    L4-L5  . Osteopenia   . Personal history of radiation therapy   . PONV (postoperative nausea and vomiting)    "when I have general anesthetic" (08/28/2018)  . PVC (premature ventricular contraction)    Takes metoprolol PRN for  pvc's    Family History  Problem  Relation Age of Onset  . Heart disease Mother 59       MI  . Lung cancer Mother   . Hypertension Mother   . Alcohol abuse Father   . COPD Father   . Colon cancer Maternal Grandfather 65  . Diabetes Paternal Grandmother   . Breast cancer Maternal Aunt   . Pancreatic cancer Maternal Aunt   . Stomach cancer Neg Hx     Past Surgical History:  Procedure Laterality Date  . BREAST BIOPSY Right 2014  . BREAST LUMPECTOMY WITH NEEDLE LOCALIZATION AND AXILLARY SENTINEL LYMPH NODE BX Right 06/11/2013   Procedure: BREAST LUMPECTOMY WITH NEEDLE LOCALIZATION AND AXILLARY SENTINEL LYMPH NODE BX;  Surgeon: Harl Bowie, MD;  Location: [facility];  Service: General;  Laterality: Right;  Needle localization BCG 7:30  . COLONOSCOPY W/ BIOPSIES AND POLYPECTOMY  multiple  . JOINT REPLACEMENT    . KNEE ARTHROSCOPY Left 2006  . TOTAL KNEE ARTHROPLASTY Right 08/28/2018  . TOTAL KNEE ARTHROPLASTY Right 08/28/2018   Procedure: RIGHT TOTAL KNEE ARTHROPLASTY;  Surgeon: Garald Balding, MD;  Location: [facility];  Service: Orthopedics;  Laterality: Right;   Social History   Occupational History  . Occupation: Programmer, multimedia: UNEMPLOYED  Tobacco Use  . Smoking status: Never Smoker  . Smokeless tobacco: Never Used  Substance and Sexual Activity  . Alcohol use: Yes    Alcohol/week: 0.0 standard drinks    Comment: 08/28/2018 "1-2 drinks/month"  . Drug use: Never  . Sexual activity: Not Currently    Comment: menarche 79, 78 1 age 36, menopause 69, HRT x 9 yrs     Garald Balding, MD   Note - This record has been created using [employer].  Chart creation errors have been sought, but may not always  have been located. Such creation errors do not reflect on  the standard of medical care.

## 2018-10-03 ENCOUNTER — Encounter (HOSPITAL_COMMUNITY): Payer: Self-pay | Admitting: Emergency Medicine

## 2018-10-03 ENCOUNTER — Emergency Department (HOSPITAL_COMMUNITY): Payer: Medicare Other

## 2018-10-03 ENCOUNTER — Inpatient Hospital Stay: Payer: Medicare Other | Attending: Hematology and Oncology | Admitting: Hematology and Oncology

## 2018-10-03 ENCOUNTER — Observation Stay (HOSPITAL_COMMUNITY)
Admission: EM | Admit: 2018-10-03 | Discharge: 2018-10-04 | Disposition: A | Discharge status: Home / Self Care | Payer: Medicare Other | Attending: Internal Medicine | Admitting: Internal Medicine

## 2018-10-03 ENCOUNTER — Other Ambulatory Visit: Payer: Self-pay

## 2018-10-03 DIAGNOSIS — I2699 Other pulmonary embolism without acute cor pulmonale: Secondary | ICD-10-CM | POA: Insufficient documentation

## 2018-10-03 DIAGNOSIS — M1712 Unilateral primary osteoarthritis, left knee: Secondary | ICD-10-CM | POA: Diagnosis not present

## 2018-10-03 DIAGNOSIS — I2609 Other pulmonary embolism with acute cor pulmonale: Secondary | ICD-10-CM

## 2018-10-03 DIAGNOSIS — R3129 Other microscopic hematuria: Secondary | ICD-10-CM | POA: Insufficient documentation

## 2018-10-03 DIAGNOSIS — Z79899 Other long term (current) drug therapy: Secondary | ICD-10-CM | POA: Diagnosis not present

## 2018-10-03 DIAGNOSIS — Z853 Personal history of malignant neoplasm of breast: Secondary | ICD-10-CM | POA: Diagnosis not present

## 2018-10-03 DIAGNOSIS — M8580 Other specified disorders of bone density and structure, unspecified site: Secondary | ICD-10-CM | POA: Diagnosis not present

## 2018-10-03 DIAGNOSIS — Z8701 Personal history of pneumonia (recurrent): Secondary | ICD-10-CM | POA: Insufficient documentation

## 2018-10-03 DIAGNOSIS — M1711 Unilateral primary osteoarthritis, right knee: Secondary | ICD-10-CM | POA: Diagnosis not present

## 2018-10-03 DIAGNOSIS — I10 Essential (primary) hypertension: Secondary | ICD-10-CM | POA: Diagnosis not present

## 2018-10-03 DIAGNOSIS — Z880 Allergy status to penicillin: Secondary | ICD-10-CM | POA: Insufficient documentation

## 2018-10-03 DIAGNOSIS — K589 Irritable bowel syndrome without diarrhea: Secondary | ICD-10-CM | POA: Insufficient documentation

## 2018-10-03 DIAGNOSIS — Z96651 Presence of right artificial knee joint: Secondary | ICD-10-CM | POA: Insufficient documentation

## 2018-10-03 DIAGNOSIS — D539 Nutritional anemia, unspecified: Secondary | ICD-10-CM | POA: Insufficient documentation

## 2018-10-03 DIAGNOSIS — I110 Hypertensive heart disease with heart failure: Secondary | ICD-10-CM | POA: Diagnosis not present

## 2018-10-03 DIAGNOSIS — Z170 Estrogen receptor positive status [ER+]: Secondary | ICD-10-CM | POA: Insufficient documentation

## 2018-10-03 DIAGNOSIS — Z8249 Family history of ischemic heart disease and other diseases of the circulatory system: Secondary | ICD-10-CM | POA: Diagnosis not present

## 2018-10-03 DIAGNOSIS — Z923 Personal history of irradiation: Secondary | ICD-10-CM | POA: Diagnosis not present

## 2018-10-03 DIAGNOSIS — R5383 Other fatigue: Secondary | ICD-10-CM | POA: Insufficient documentation

## 2018-10-03 DIAGNOSIS — Z87442 Personal history of urinary calculi: Secondary | ICD-10-CM | POA: Diagnosis not present

## 2018-10-03 DIAGNOSIS — Z885 Allergy status to narcotic agent status: Secondary | ICD-10-CM | POA: Insufficient documentation

## 2018-10-03 DIAGNOSIS — I059 Rheumatic mitral valve disease, unspecified: Secondary | ICD-10-CM | POA: Diagnosis not present

## 2018-10-03 DIAGNOSIS — J90 Pleural effusion, not elsewhere classified: Secondary | ICD-10-CM | POA: Insufficient documentation

## 2018-10-03 DIAGNOSIS — Z79890 Hormone replacement therapy (postmenopausal): Secondary | ICD-10-CM | POA: Insufficient documentation

## 2018-10-03 DIAGNOSIS — E038 Other specified hypothyroidism: Secondary | ICD-10-CM | POA: Diagnosis not present

## 2018-10-03 DIAGNOSIS — I5032 Chronic diastolic (congestive) heart failure: Secondary | ICD-10-CM | POA: Diagnosis not present

## 2018-10-03 DIAGNOSIS — E039 Hypothyroidism, unspecified: Secondary | ICD-10-CM | POA: Insufficient documentation

## 2018-10-03 DIAGNOSIS — R319 Hematuria, unspecified: Secondary | ICD-10-CM | POA: Insufficient documentation

## 2018-10-03 DIAGNOSIS — C50411 Malignant neoplasm of upper-outer quadrant of right female breast: Secondary | ICD-10-CM | POA: Insufficient documentation

## 2018-10-03 DIAGNOSIS — E785 Hyperlipidemia, unspecified: Secondary | ICD-10-CM | POA: Diagnosis not present

## 2018-10-03 DIAGNOSIS — Z86010 Personal history of colonic polyps: Secondary | ICD-10-CM | POA: Diagnosis not present

## 2018-10-03 LAB — BASIC METABOLIC PANEL
Anion gap: 8 (ref 5–15)
BUN: 12 mg/dL (ref 8–23)
CO2: 25 mmol/L (ref 22–32)
Calcium: 8.8 mg/dL — ABNORMAL LOW (ref 8.9–10.3)
Chloride: 100 mmol/L (ref 98–111)
Creatinine, Ser: 0.83 mg/dL (ref 0.44–1.00)
GFR calc Af Amer: >60 mL/min (ref >60)
GFR calc non Af Amer: >60 mL/min (ref >60)
GLUCOSE: 98 mg/dL (ref 70–99)
POTASSIUM: 3.9 mmol/L (ref 3.5–5.1)
SODIUM: 133 mmol/L — AB (ref 135–145)

## 2018-10-03 LAB — PROTIME-INR
INR: 1.07
Prothrombin Time: 13.8 s (ref 11.4–15.2)

## 2018-10-03 LAB — CBC WITH DIFFERENTIAL/PLATELET
Abs Immature Granulocytes: 0.09 10*3/uL — ABNORMAL HIGH (ref 0.00–0.07)
BASOS PCT: 0 %
Basophils Absolute: 0.1 10*3/uL (ref 0.0–0.1)
EOS ABS: 0 10*3/uL (ref 0.0–0.5)
Eosinophils Relative: 0 %
HCT: 36.7 % (ref 36.0–46.0)
Hemoglobin: 11.8 g/dL — ABNORMAL LOW (ref 12.0–15.0)
IMMATURE GRANULOCYTES: 1 %
Lymphocytes Relative: 5 %
Lymphs Abs: 0.7 10*3/uL (ref 0.7–4.0)
MCH: 33.7 pg (ref 26.0–34.0)
MCHC: 32.2 g/dL (ref 30.0–36.0)
MCV: 104.9 fL — AB (ref 80.0–100.0)
MONOS PCT: 9 %
Monocytes Absolute: 1.2 10*3/uL — ABNORMAL HIGH (ref 0.1–1.0)
NEUTROS PCT: 85 %
Neutro Abs: 11 10*3/uL — ABNORMAL HIGH (ref 1.7–7.7)
PLATELETS: 244 10*3/uL (ref 150–400)
RBC: 3.5 MIL/uL — AB (ref 3.87–5.11)
RDW: 13 % (ref 11.5–15.5)
WBC: 13.1 10*3/uL — AB (ref 4.0–10.5)
nRBC: 0 % (ref 0.0–0.2)

## 2018-10-03 LAB — I-STAT TROPONIN, ED: TROPONIN I, POC: 0 ng/mL (ref 0.00–0.08)

## 2018-10-03 LAB — BRAIN NATRIURETIC PEPTIDE: B Natriuretic Peptide: 22.4 pg/mL (ref 0.0–100.0)

## 2018-10-03 LAB — APTT: aPTT: 29 s (ref 24–36)

## 2018-10-03 MED ORDER — LEVOTHYROXINE SODIUM 88 MCG PO TABS
88.0000 ug | ORAL_TABLET | ORAL | Status: DC
  Administered 2018-10-04: 88 ug via ORAL
  Filled 2018-10-03: qty 1

## 2018-10-03 MED ORDER — IOPAMIDOL (ISOVUE-370) INJECTION 76%
100.0000 mL | Freq: Once | INTRAVENOUS | Status: AC | PRN
  Administered 2018-10-03: 100 mL via INTRAVENOUS

## 2018-10-03 MED ORDER — LEVOTHYROXINE SODIUM 100 MCG PO TABS: 100.0000 ug | ORAL_TABLET | ORAL | Status: DC

## 2018-10-03 MED ORDER — POLYETHYLENE GLYCOL 3350 17 G PO PACK: 17.0000 g | PACK | Freq: Every day | ORAL | Status: DC | PRN

## 2018-10-03 MED ORDER — HEPARIN BOLUS VIA INFUSION
4000.0000 [IU] | Freq: Once | INTRAVENOUS | Status: AC
  Administered 2018-10-03: 4000 [IU] via INTRAVENOUS
  Filled 2018-10-03: qty 4000

## 2018-10-03 MED ORDER — ACETAMINOPHEN 325 MG PO TABS: 650.0000 mg | ORAL_TABLET | Freq: Four times a day (QID) | ORAL | Status: DC | PRN

## 2018-10-03 MED ORDER — ONDANSETRON HCL 4 MG PO TABS: 4.0000 mg | ORAL_TABLET | Freq: Four times a day (QID) | ORAL | Status: DC | PRN

## 2018-10-03 MED ORDER — RIVAROXABAN 15 MG PO TABS
15.0000 mg | ORAL_TABLET | Freq: Two times a day (BID) | ORAL | Status: DC
  Administered 2018-10-03 – 2018-10-04 (×2): 15 mg via ORAL
  Filled 2018-10-03 (×2): qty 1

## 2018-10-03 MED ORDER — TAMOXIFEN CITRATE 10 MG PO TABS
20.0000 mg | ORAL_TABLET | Freq: Every day | ORAL | Status: DC
  Administered 2018-10-03: 20 mg via ORAL
  Filled 2018-10-03: qty 2

## 2018-10-03 MED ORDER — RIVAROXABAN (XARELTO) EDUCATION KIT FOR DVT/PE PATIENTS
PACK | Freq: Once | Status: DC
  Filled 2018-10-03: qty 1

## 2018-10-03 MED ORDER — TRAMADOL HCL 50 MG PO TABS
50.0000 mg | ORAL_TABLET | Freq: Four times a day (QID) | ORAL | Status: DC | PRN
  Administered 2018-10-03: 50 mg via ORAL
  Filled 2018-10-03: qty 1

## 2018-10-03 MED ORDER — HEPARIN (PORCINE) 25000 UT/250ML-% IV SOLN
1000.0000 [IU]/h | INTRAVENOUS | Status: AC
  Administered 2018-10-03: 1000 [IU]/h via INTRAVENOUS
  Filled 2018-10-03: qty 250

## 2018-10-03 MED ORDER — SODIUM CHLORIDE (PF) 0.9 % IJ SOLN
INTRAMUSCULAR | Status: AC
  Filled 2018-10-03: qty 50

## 2018-10-03 MED ORDER — IOPAMIDOL (ISOVUE-370) INJECTION 76%
INTRAVENOUS | Status: AC
  Filled 2018-10-03: qty 100

## 2018-10-03 MED ORDER — ACETAMINOPHEN 650 MG RE SUPP: 650.0000 mg | Freq: Four times a day (QID) | RECTAL | Status: DC | PRN

## 2018-10-03 MED ORDER — SODIUM CHLORIDE 0.9 % IV SOLN
INTRAVENOUS | Status: DC
  Administered 2018-10-03: 16:00:00 via INTRAVENOUS

## 2018-10-03 MED ORDER — ATORVASTATIN CALCIUM 10 MG PO TABS
10.0000 mg | ORAL_TABLET | Freq: Every evening | ORAL | Status: DC
  Administered 2018-10-03: 10 mg via ORAL
  Filled 2018-10-03: qty 1

## 2018-10-03 MED ORDER — ONDANSETRON HCL 4 MG/2ML IJ SOLN: 4.0000 mg | Freq: Four times a day (QID) | INTRAMUSCULAR | Status: DC | PRN

## 2018-10-03 MED ORDER — BUPROPION HCL ER (XL) 300 MG PO TB24
300.0000 mg | ORAL_TABLET | Freq: Every day | ORAL | Status: DC
  Administered 2018-10-04: 300 mg via ORAL
  Filled 2018-10-03: qty 1

## 2018-10-03 NOTE — ED Triage Notes (Signed)
Patient here from home with complaints of chest pain and SOB that started last night. Reports that she has pneumonia and was on Levaquin, finished last dose yesterday. Pain increased with taking deep breath.

## 2018-10-03 NOTE — H&P (Signed)
History and Physical  [name] BMW:[id number] DOB: 03-30-1946 DOA: 10/03/2018  Referring physician: Isla Pence, ER physician PCP: Hulan Fess, MD  Outpatient Specialists: [name], orthopedic surgery Patient coming from: Home & is able to ambulate without assistance normally  Chief Complaint: Shortness of breath and right-sided chest pain  HPI: [name] is a 72 y.o. female with medical history significant for hypothyroidism and breast cancer, treated who underwent a right total knee replacement about a month ago.  Postop, no issues although she developed a pneumonia and was treated for 1 week.  Her cough continued to persist so she was continued on her Levaquin.  On 11/12, she started experiencing right-sided pain, especially with deep inspiration, continued persistent cough and some dyspnea on exertion.  She then came into the emergency room on 11/13.  ED Course: In the emergency room, patient was noted to have normal oxygen saturations.  White blood cell count elevated at 13.1.  Hemoglobin improved from a month prior.  Normal renal function.  Troponin and BNP normal.  Patient underwent a CT scan of the chest noting small volume pulmonary emboli most prominently in the right lower lobe with small right effusion and right lower lobe airway disease likely due to pulmonary infarction.  Patient also noted to have some mild right heart strain, but given relatively small volume pulmonary emboli there is a question of whether this was pre-existing pulmonary hypertension. (Patient had an echocardiogram done in 2017 which noted no signs of pulmonary hypertension, only mild diastolic dysfunction).  Patient started on heparin drip and hospitalist were called for further evaluation  Review of Systems: Patient seen in the emergency room. Pt complains of pain with deep inspiration and some mild dyspnea on exertion.  Mild nonproductive cough  Pt denies any chest pain, headache,  vision changes, dysphagia, palpitations, shortness of breath, wheeze, cough, abdominal pain, hematuria, dysuria, constipation, diarrhea, focal extremity numbness weakness or pain.  Review of systems are otherwise negative   Past Medical History:  Diagnosis Date  . Adenomatous colon polyp 03/07/2012  . Allergic rhinitis   . Arthritis    "knees" (08/28/2018)  . Breast cancer, right breast ([place]) 05/20/13   "no chemo" (08/28/2018)  . Chronic lower back pain   . Diverticulosis   . Graves' disease    2011  . Graves' disease   . History of kidney stones   . HLD (hyperlipidemia)   . Hx of radiation therapy 08/13/13- 09/06/13   right breast 4250 cGy 17 sessions  . Hypertension   . IBS (irritable bowel syndrome)   . Lumbar herniated disc    L4-L5  . Osteopenia   . Personal history of radiation therapy   . PONV (postoperative nausea and vomiting)    "when I have general anesthetic" (08/28/2018)  . PVC (premature ventricular contraction)    Takes metoprolol PRN for  pvc's   Past Surgical History:  Procedure Laterality Date  . BREAST BIOPSY Right 2014  . BREAST LUMPECTOMY WITH NEEDLE LOCALIZATION AND AXILLARY SENTINEL LYMPH NODE BX Right 06/11/2013   Procedure: BREAST LUMPECTOMY WITH NEEDLE LOCALIZATION AND AXILLARY SENTINEL LYMPH NODE BX;  Surgeon: Harl Bowie, MD;  Location: [facility];  Service: General;  Laterality: Right;  Needle localization BCG 7:30  . COLONOSCOPY W/ BIOPSIES AND POLYPECTOMY  multiple  . JOINT REPLACEMENT    . KNEE ARTHROSCOPY Left 2006  . TOTAL KNEE ARTHROPLASTY Right 08/28/2018  . TOTAL KNEE ARTHROPLASTY Right 08/28/2018   Procedure: RIGHT TOTAL KNEE  ARTHROPLASTY;  Surgeon: Garald Balding, MD;  Location: [facility];  Service: Orthopedics;  Laterality: Right;    Social History:  reports that she has never smoked. She has never used smokeless tobacco. She reports that she drinks alcohol. She reports that she does not use drugs.   Allergies  Allergen Reactions  .  Dilaudid [Hydromorphone Hcl] Nausea And Vomiting  . Terak [Terramycin] Rash  . Amoxicillin Diarrhea    Has patient had a PCN reaction causing immediate rash, facial/tongue/throat swelling, SOB or lightheadedness with hypotension: No Has patient had a PCN reaction causing severe rash involving mucus membranes or skin necrosis: No Has patient had a PCN reaction that required hospitalization: No Has patient had a PCN reaction occurring within the last 10 years: Unknown If all of the above answers are "NO", then may proceed with Cephalosporin use.     Family History  Problem Relation Age of Onset  . Heart disease Mother 10       MI  . Lung cancer Mother   . Hypertension Mother   . Alcohol abuse Father   . COPD Father   . Colon cancer Maternal Grandfather 67  . Diabetes Paternal Grandmother   . Breast cancer Maternal Aunt   . Pancreatic cancer Maternal Aunt   . Stomach cancer Neg Hx       Prior to Admission medications   Medication Sig Start Date End Date Taking? Authorizing Provider  acetaminophen (TYLENOL) 325 MG tablet Take 2 tablets (650 mg total) by mouth every 6 (six) hours. 08/29/18  Yes Petrarca, Mike Craze, PA-C  atorvastatin (LIPITOR) 10 MG tablet Take 10 mg by mouth every evening.    Yes [provider]  Bacillus Coagulans-Inulin (ALIGN PREBIOTIC-PROBIOTIC PO) Take 1 tablet by mouth daily.   Yes [provider]  buPROPion (WELLBUTRIN XL) 300 MG 24 hr tablet Take 300 mg by mouth daily.   Yes [provider]  Echinacea 350 MG CAPS Take 350 capsules by mouth 2 (two) times daily.   Yes [provider]  fluticasone (FLONASE) 50 MCG/ACT nasal spray Place 2 sprays into the nose daily.    Yes [provider]  guaiFENesin (MUCINEX) 600 MG 12 hr tablet Take by mouth 2 (two) times daily.   Yes [provider]  ibuprofen (ADVIL,MOTRIN) 200 MG tablet Take 400 mg by mouth every 8 (eight) hours as needed for mild pain.   Yes [provider]  levofloxacin (LEVAQUIN) 750 MG tablet  09/26/18  Yes [provider]  lisinopril (PRINIVIL,ZESTRIL) 10 MG tablet Take 1 tablet (10 mg total) by mouth daily. NEED OV. Patient taking differently: Take 10 mg by mouth every other day. NEED OV. 08/21/17  Yes Kilroy, Doreene Burke, PA-C  methocarbamol (ROBAXIN) 500 MG tablet Take 1 tablet (500 mg total) by mouth every 8 (eight) hours as needed for muscle spasms. 08/29/18  Yes Petrarca, Mike Craze, PA-C  metoprolol tartrate (LOPRESSOR) 25 MG tablet Take 25 mg 1 hour before Cardiac CT Patient taking differently: Take 25 mg by mouth as needed (for PVC).  03/20/18  Yes Barrett, Evelene Croon, PA-C  Multiple Vitamin (MULTIVITAMIN) tablet Take 1 tablet by mouth daily.   Yes [provider]  SYNTHROID 100 MCG tablet Take 100 mcg by mouth every other day.  07/04/13  Yes [provider]  SYNTHROID 88 MCG tablet Take 88 mcg by mouth every other day.  07/04/13  Yes [provider]  tamoxifen (NOLVADEX) 20 MG tablet Take 1  tablet (20 mg total) by mouth daily. 08/31/17  Yes Nicholas Lose, MD  TURMERIC PO Take 1 capsule by mouth daily.   Yes [provider]  aspirin 81 MG chewable tablet Chew 1 tablet (81 mg total) by mouth 2 (two) times daily. Patient not taking: Reported on 10/03/2018 08/29/18   Cherylann Ratel, PA-C    Physical Exam: BP 114/77 (BP Location: Left Arm)   Pulse 88   Temp 98.7 F (37.1 C) (Oral)   Resp 16   Ht 5' 5.5" (1.664 m)   Wt 57.9 kg   SpO2 97%   BMI 20.93 kg/m   General: Alert and oriented x3, no acute distress Eyes: Sclera nonicteric, extra ocular movements are intact ENT: Normocephalic and atraumatic, mucous memories are moist Neck: Supple, no JVD Cardiovascular: Regular rate and rhythm, S1-S2, 2 out of 6 systolic ejection murmur Respiratory: Moderate inspiratory effort, limited due to pain, but otherwise clear to auscultation bilaterally Abdomen: Soft, nontender, nondistended, positive  bowel sounds Skin: No skin breaks, tears or lesions Musculoskeletal: No clubbing cyanosis or edema Psychiatric: Appropriate, no evidence of psychoses Neurologic: No focal deficits          Labs on Admission:  Basic Metabolic Panel: Recent Labs  Lab 10/03/18 1044  NA 133*  K 3.9  CL 100  CO2 25  GLUCOSE 98  BUN 12  CREATININE 0.83  CALCIUM 8.8*   Liver Function Tests: No results for input(s): AST, ALT, ALKPHOS, BILITOT, PROT, ALBUMIN in the last 168 hours. No results for input(s): LIPASE, AMYLASE in the last 168 hours. No results for input(s): AMMONIA in the last 168 hours. CBC: Recent Labs  Lab 10/03/18 1315  WBC 13.1*  NEUTROABS 11.0*  HGB 11.8*  HCT 36.7  MCV 104.9*  PLT 244   Cardiac Enzymes: No results for input(s): CKTOTAL, CKMB, CKMBINDEX, TROPONINI in the last 168 hours.  BNP (last 3 results) Recent Labs    10/03/18 1045  BNP 22.4    ProBNP (last 3 results) No results for input(s): PROBNP in the last 8760 hours.  CBG: No results for input(s): GLUCAP in the last 168 hours.  Radiological Exams on Admission: Ct Angio Chest Pe W And/or Wo Contrast  Result Date: 10/03/2018 CLINICAL DATA:  Suspect pulmonary embolism.  Recent pneumonia. EXAM: CT ANGIOGRAPHY CHEST WITH CONTRAST TECHNIQUE: Multidetector CT imaging of the chest was performed using the standard protocol during bolus administration of intravenous contrast. Multiplanar CT image reconstructions and MIPs were obtained to evaluate the vascular anatomy. CONTRAST:  162mL ISOVUE-370 IOPAMIDOL (ISOVUE-370) INJECTION 76% COMPARISON:  Chest two-view 09/26/2018 FINDINGS: Cardiovascular: Small volume pulmonary emboli. Small peripheral emboli in the right lower lobe more right middle lobe, and lingula. Pulmonary arteries not enlarged. RV/LV ratio 1.3 suggesting right heart strain. No pericardial effusion. Normal thoracic aorta Mediastinum/Nodes: Negative for mass or adenopathy Lungs/Pleura: Small right pleural  effusion. Patchy airspace disease in the right lung base may reflect pulmonary infarct or pneumonia. Mild airspace disease right middle lobe. No effusion on the left. Minimal dependent atelectasis left lung base. Upper Abdomen: Negative Musculoskeletal: Negative Review of the MIP images confirms the above findings. IMPRESSION: Small volume pulmonary emboli most prominent right lower lobe. Small right effusion and right lower lobe airspace disease likely due to pulmonary infarction RV/LV ratio 1.3 suggesting right heart strain. Given the relatively small volume pulmonary emboli, the patient may have pre-existing pulmonary hypertension. These results were called by telephone at the time of interpretation on 10/03/2018 at 12:49 pm  to Dr. Isla Pence , who verbally acknowledged these results. Electronically Signed   By: Franchot Gallo M.D.   On: 10/03/2018 12:49    EKG: Independently reviewed.  Normal sinus rhythm.  Mild left atrial enlargement.  No previous EKG for comparison.  Assessment/Plan Present on Admission: . Unilateral primary osteoarthritis, right knee . Essential hypertension . Hypothyroidism . PE (pulmonary thromboembolism) ([place]) . Chronic diastolic CHF (congestive heart failure) ([place]) . Macrocytic anemia  Principal Problem:   PE (pulmonary thromboembolism) ([place]): With secondary infarction.  Fortunately, no signs of hypoxia.  Patient breathing comfortably on room air.  Some pain with deep inspiration, so we will add incentive spirometry.  Likely secondary to recent knee surgery.  No reason to check lower extremity Doppler at this as this would not change our underlying treatment plan.  On heparin currently, pharmacy to start [place].  Check 2D echo given signs of potential heart strain.  Anticipate discharge home tomorrow Active Problems:   Essential hypertension: Blood pressure stable.  Takes lisinopril every other day.   Unilateral primary osteoarthritis, right knee: Status  post knee replacement a month ago.   Hypothyroidism: Continue Synthroid   Chronic diastolic CHF (congestive heart failure) ([place]): Stable.  BNP normal.  Monitor input and output.  Noted on echocardiogram in 2017   Macrocytic anemia: Unclear etiology.  Was fine earlier this year.  B12 and folate checked last month and stable.  Hemoglobin improved from previous.  Anemia also secondary to blood loss from surgery.   DVT prophylaxis: Heparin and Xarelto  Code Status: Full code  Family Communication: Friend at the bedside  Disposition Plan: Anticipate discharge tomorrow if echocardiogram okay, tolerating Xarelto  Consults called: None  Admission status: Given likelihood she will be discharged tomorrow, have placed under observation    Annita Brod MD [facility] Pager [phone]  If 7PM-7AM, please contact night-coverage [email or web address] Password [email or web address]  10/03/2018, 3:40 PM

## 2018-10-03 NOTE — ED Provider Notes (Addendum)
[facility] DEPT Provider Note   CSN: [id number] Arrival date & time: 10/03/18  1011     History   Chief Complaint Chief Complaint  Patient presents with  . Shortness of Breath  . Chest Pain    HPI [name] is a 72 y.o. female.  Pt presents to the ED today with right sided cp and sob.  The pt had a right knee replacement on 10/9.  Since then, she developed LLL pneumonia and finished a 2 week course of Levaquin today.  Last night, she developed right sided cp.  She said that it hurts with movement and with deep breaths.  The pt. Is not on blood thinners.  She has been less active than normal since the knee surgery.     Past Medical History:  Diagnosis Date  . Adenomatous colon polyp 03/07/2012  . Allergic rhinitis   . Arthritis    "knees" (08/28/2018)  . Breast cancer, right breast ([place]) 05/20/13   "no chemo" (08/28/2018)  . Chronic lower back pain   . Diverticulosis   . Graves' disease    2011  . Graves' disease   . History of kidney stones   . HLD (hyperlipidemia)   . Hx of radiation therapy 08/13/13- 09/06/13   right breast 4250 cGy 17 sessions  . Hypertension   . IBS (irritable bowel syndrome)   . Lumbar herniated disc    L4-L5  . Osteopenia   . Personal history of radiation therapy   . PONV (postoperative nausea and vomiting)    "when I have general anesthetic" (08/28/2018)  . PVC (premature ventricular contraction)    Takes metoprolol PRN for  pvc's    Patient Active Problem List   Diagnosis Date Noted  . Hypothyroidism 10/03/2018  . PE (pulmonary thromboembolism) ([place]) 10/03/2018  . Chronic diastolic CHF (congestive heart failure) ([place]) 10/03/2018  . Unilateral primary osteoarthritis, right knee 08/28/2018  . History of total right knee replacement 08/28/2018  . Bilateral primary osteoarthritis of knee 03/12/2018  . Essential hypertension 08/26/2016  . Mitral valve disorder 08/26/2016  . History of palpitations  08/26/2016  . Dyslipidemia 08/26/2016  . Internal hemorrhoids 08/03/2015  . Rectal bleeding 08/03/2015  . Hx of radiation therapy   . Breast cancer of upper-outer quadrant of right female breast ([place]) 05/23/2013  . Personal history of colonic polyps 01/06/2012  . Irritable bowel syndrome - constipation predominant 01/06/2012    Past Surgical History:  Procedure Laterality Date  . BREAST BIOPSY Right 2014  . BREAST LUMPECTOMY WITH NEEDLE LOCALIZATION AND AXILLARY SENTINEL LYMPH NODE BX Right 06/11/2013   Procedure: BREAST LUMPECTOMY WITH NEEDLE LOCALIZATION AND AXILLARY SENTINEL LYMPH NODE BX;  Surgeon: Harl Bowie, MD;  Location: [facility];  Service: General;  Laterality: Right;  Needle localization BCG 7:30  . COLONOSCOPY W/ BIOPSIES AND POLYPECTOMY  multiple  . JOINT REPLACEMENT    . KNEE ARTHROSCOPY Left 2006  . TOTAL KNEE ARTHROPLASTY Right 08/28/2018  . TOTAL KNEE ARTHROPLASTY Right 08/28/2018   Procedure: RIGHT TOTAL KNEE ARTHROPLASTY;  Surgeon: Garald Balding, MD;  Location: [facility];  Service: Orthopedics;  Laterality: Right;     OB History   None      Home Medications    Prior to Admission medications   Medication Sig Start Date End Date Taking? Authorizing Provider  acetaminophen (TYLENOL) 325 MG tablet Take 2 tablets (650 mg total) by mouth every 6 (six) hours. 08/29/18  Yes Cherylann Ratel, PA-C  atorvastatin (LIPITOR) 10 MG tablet Take 10 mg by mouth every evening.    Yes [provider]  Bacillus Coagulans-Inulin (ALIGN PREBIOTIC-PROBIOTIC PO) Take 1 tablet by mouth daily.   Yes [provider]  buPROPion (WELLBUTRIN XL) 300 MG 24 hr tablet Take 300 mg by mouth daily.   Yes [provider]  Echinacea 350 MG CAPS Take 350 capsules by mouth 2 (two) times daily.   Yes [provider]  fluticasone (FLONASE) 50 MCG/ACT nasal spray Place 2 sprays into the nose daily.    Yes [provider]  guaiFENesin (MUCINEX) 600 MG 12  hr tablet Take by mouth 2 (two) times daily.   Yes [provider]  ibuprofen (ADVIL,MOTRIN) 200 MG tablet Take 400 mg by mouth every 8 (eight) hours as needed for mild pain.   Yes [provider]  levofloxacin (LEVAQUIN) 750 MG tablet  09/26/18  Yes [provider]  lisinopril (PRINIVIL,ZESTRIL) 10 MG tablet Take 1 tablet (10 mg total) by mouth daily. NEED OV. Patient taking differently: Take 10 mg by mouth every other day. NEED OV. 08/21/17  Yes Kilroy, Doreene Burke, PA-C  methocarbamol (ROBAXIN) 500 MG tablet Take 1 tablet (500 mg total) by mouth every 8 (eight) hours as needed for muscle spasms. 08/29/18  Yes Petrarca, Mike Craze, PA-C  metoprolol tartrate (LOPRESSOR) 25 MG tablet Take 25 mg 1 hour before Cardiac CT Patient taking differently: Take 25 mg by mouth as needed (for PVC).  03/20/18  Yes Barrett, Evelene Croon, PA-C  Multiple Vitamin (MULTIVITAMIN) tablet Take 1 tablet by mouth daily.   Yes [provider]  SYNTHROID 100 MCG tablet Take 100 mcg by mouth every other day.  07/04/13  Yes [provider]  SYNTHROID 88 MCG tablet Take 88 mcg by mouth every other day.  07/04/13  Yes [provider]  tamoxifen (NOLVADEX) 20 MG tablet Take 1 tablet (20 mg total) by mouth daily. 08/31/17  Yes Nicholas Lose, MD  TURMERIC PO Take 1 capsule by mouth daily.   Yes [provider]  aspirin 81 MG chewable tablet Chew 1 tablet (81 mg total) by mouth 2 (two) times daily. Patient not taking: Reported on 10/03/2018 08/29/18   Biagio Borg D, PA-C  ondansetron (ZOFRAN ODT) 4 MG disintegrating tablet Take 1 tablet (4 mg total) by mouth every 8 (eight) hours as needed for nausea or vomiting. Patient not taking: Reported on 10/03/2018 08/29/18   Biagio Borg D, PA-C  oxyCODONE (OXY IR/ROXICODONE) 5 MG immediate release tablet Take 1-2 tablets (5-10 mg total) by mouth every 4 (four) hours as needed for moderate pain or severe pain (pain score 4-6). Patient not  taking: Reported on 10/03/2018 08/29/18   Cherylann Ratel, PA-C    Family History Family History  Problem Relation Age of Onset  . Heart disease Mother 30       MI  . Lung cancer Mother   . Hypertension Mother   . Alcohol abuse Father   . COPD Father   . Colon cancer Maternal Grandfather 80  . Diabetes Paternal Grandmother   . Breast cancer Maternal Aunt   . Pancreatic cancer Maternal Aunt   . Stomach cancer Neg Hx     Social History Social History   Tobacco Use  . Smoking status: Never Smoker  . Smokeless tobacco: Never Used  Substance Use Topics  . Alcohol use: Yes    Alcohol/week: 0.0 standard drinks    Comment: 08/28/2018 "1-2 drinks/month"  .  Drug use: Never     Allergies   Dilaudid [hydromorphone hcl]; Terak [terramycin]; and Amoxicillin   Review of Systems Review of Systems  Constitutional: Positive for chills.  Respiratory: Positive for shortness of breath.   Cardiovascular: Positive for chest pain.  All other systems reviewed and are negative.    Physical Exam Updated Vital Signs BP 127/69   Pulse 88   Temp 97.8 F (36.6 C) (Oral)   Resp (!) 25   Ht 5' 5.5" (1.664 m)   Wt 54.4 kg   SpO2 100%   BMI 19.67 kg/m   Physical Exam  Constitutional: She is oriented to person, place, and time. She appears well-developed and well-nourished.  HENT:  Head: Normocephalic and atraumatic.  Mouth/Throat: Oropharynx is clear and moist.  Eyes: Pupils are equal, round, and reactive to light. EOM are normal.  Neck: Normal range of motion. Neck supple.  Cardiovascular: Normal rate and regular rhythm.  Pulmonary/Chest: Effort normal and breath sounds normal.  Abdominal: Soft. Bowel sounds are normal.  Musculoskeletal: Normal range of motion.       Right lower leg: Normal.       Left lower leg: Normal.  Neurological: She is alert and oriented to person, place, and time.  Skin: Skin is warm and dry. Capillary refill takes less than 2 seconds.  Psychiatric:  She has a normal mood and affect. Her behavior is normal.  Nursing note and vitals reviewed.    ED Treatments / Results  Labs (all labs ordered are listed, but only abnormal results are displayed) Labs Reviewed  BASIC METABOLIC PANEL - Abnormal; Notable for the following components:      Result Value   Sodium 133 (*)    Calcium 8.8 (*)    All other components within normal limits  CBC WITH DIFFERENTIAL/PLATELET - Abnormal; Notable for the following components:   WBC 13.1 (*)    RBC 3.50 (*)    Hemoglobin 11.8 (*)    MCV 104.9 (*)    Neutro Abs 11.0 (*)    Monocytes Absolute 1.2 (*)    Abs Immature Granulocytes 0.09 (*)    All other components within normal limits  BRAIN NATRIURETIC PEPTIDE  APTT  PROTIME-INR  HEPARIN LEVEL (UNFRACTIONATED)  I-STAT TROPONIN, ED    EKG EKG Interpretation  Date/Time:  Wednesday October 03 2018 10:21:49 EST Ventricular Rate:  93 PR Interval:    QRS Duration: 88 QT Interval:  392 QTC Calculation: 488 R Axis:   49 Text Interpretation:  Sinus rhythm Probable left atrial enlargement Probable anterior infarct, age indeterminate No old tracing to compare Confirmed by Isla Pence [phone]) on 10/03/2018 10:26:16 AM Also confirmed by Isla Pence [phone]), editor Lynder Parents [phone])  on 10/03/2018 10:33:37 AM   Radiology Ct Angio Chest Pe W And/or Wo Contrast  Result Date: 10/03/2018 CLINICAL DATA:  Suspect pulmonary embolism.  Recent pneumonia. EXAM: CT ANGIOGRAPHY CHEST WITH CONTRAST TECHNIQUE: Multidetector CT imaging of the chest was performed using the standard protocol during bolus administration of intravenous contrast. Multiplanar CT image reconstructions and MIPs were obtained to evaluate the vascular anatomy. CONTRAST:  154mL ISOVUE-370 IOPAMIDOL (ISOVUE-370) INJECTION 76% COMPARISON:  Chest two-view 09/26/2018 FINDINGS: Cardiovascular: Small volume pulmonary emboli. Small peripheral emboli in the right lower lobe more right  middle lobe, and lingula. Pulmonary arteries not enlarged. RV/LV ratio 1.3 suggesting right heart strain. No pericardial effusion. Normal thoracic aorta Mediastinum/Nodes: Negative for mass or adenopathy Lungs/Pleura: Small right pleural effusion. Patchy airspace disease in the  right lung base may reflect pulmonary infarct or pneumonia. Mild airspace disease right middle lobe. No effusion on the left. Minimal dependent atelectasis left lung base. Upper Abdomen: Negative Musculoskeletal: Negative Review of the MIP images confirms the above findings. IMPRESSION: Small volume pulmonary emboli most prominent right lower lobe. Small right effusion and right lower lobe airspace disease likely due to pulmonary infarction RV/LV ratio 1.3 suggesting right heart strain. Given the relatively small volume pulmonary emboli, the patient may have pre-existing pulmonary hypertension. These results were called by telephone at the time of interpretation on 10/03/2018 at 12:49 pm to Dr. Isla Pence , who verbally acknowledged these results. Electronically Signed   By: Franchot Gallo M.D.   On: 10/03/2018 12:49    Procedures Procedures (including critical care time)  Medications Ordered in ED Medications  sodium chloride (PF) 0.9 % injection (has no administration in time range)  heparin ADULT infusion 100 units/mL (25000 units/251mL sodium chloride 0.45%) (1,000 Units/hr Intravenous New Bag/Given 10/03/18 1332)  iopamidol (ISOVUE-370) 76 % injection 100 mL (100 mLs Intravenous Contrast Given 10/03/18 1226)  heparin bolus via infusion 4,000 Units (4,000 Units Intravenous Bolus from Bag 10/03/18 1329)     Initial Impression / Assessment and Plan / ED Course  I have reviewed the triage vital signs and the nursing notes.  Pertinent labs & imaging results that were available during my care of the patient were reviewed by me and considered in my medical decision making (see chart for details).    Pt's oxygen  saturations are good.  Her CT shows evidence of right heart strain, but troponin ok.  Pt will be started on heparin.  CRITICAL CARE Performed by: Isla Pence   Total critical care time: 30 minutes  Critical care time was exclusive of separately billable procedures and treating other patients.  Critical care was necessary to treat or prevent imminent or life-threatening deterioration.  Critical care was time spent personally by me on the following activities: development of treatment plan with patient and/or surrogate as well as nursing, discussions with consultants, evaluation of patient's response to treatment, examination of patient, obtaining history from patient or surrogate, ordering and performing treatments and interventions, ordering and review of laboratory studies, ordering and review of radiographic studies, pulse oximetry and re-evaluation of patient's condition. Pt d/w Dr. [place] Pink ([facility]) for admission.  Final Clinical Impressions(s) / ED Diagnoses   Final diagnoses:  Other acute pulmonary embolism with acute cor pulmonale [facility])  Pulmonary infarct [facility])    ED Discharge Orders    None       Isla Pence, MD 10/03/18 1358    Isla Pence, MD 10/15/18 [phone]

## 2018-10-03 NOTE — Progress Notes (Addendum)
[facility] for heparin IV Indication: new PE  Allergies  Allergen Reactions  . Dilaudid [Hydromorphone Hcl] Nausea And Vomiting  . Terak [Terramycin] Rash  . Amoxicillin Diarrhea    Has patient had a PCN reaction causing immediate rash, facial/tongue/throat swelling, SOB or lightheadedness with hypotension: No Has patient had a PCN reaction causing severe rash involving mucus membranes or skin necrosis: No Has patient had a PCN reaction that required hospitalization: No Has patient had a PCN reaction occurring within the last 10 years: Unknown If all of the above answers are "NO", then may proceed with Cephalosporin use.     Patient Measurements: Height: 5' 5.5" (166.4 cm) Weight: 120 lb (54.4 kg) IBW/kg (Calculated) : 58.15 Heparin Dosing Weight: TBW  Vital Signs: Temp: 97.8 F (36.6 C) (11/13 1021) Temp Source: Oral (11/13 1021) BP: 107/67 (11/13 1200) Pulse Rate: 80 (11/13 1200)  Labs: Recent Labs    10/03/18 1044  CREATININE 0.83    Estimated Creatinine Clearance: 53.4 mL/min (by C-G formula based on SCr of 0.83 mg/dL).   Medical History: Past Medical History:  Diagnosis Date  . Adenomatous colon polyp 03/07/2012  . Allergic rhinitis   . Arthritis    "knees" (08/28/2018)  . Breast cancer, right breast ([place]) 05/20/13   "no chemo" (08/28/2018)  . Chronic lower back pain   . Diverticulosis   . Graves' disease    2011  . Graves' disease   . History of kidney stones   . HLD (hyperlipidemia)   . Hx of radiation therapy 08/13/13- 09/06/13   right breast 4250 cGy 17 sessions  . Hypertension   . IBS (irritable bowel syndrome)   . Lumbar herniated disc    L4-L5  . Osteopenia   . Personal history of radiation therapy   . PONV (postoperative nausea and vomiting)    "when I have general anesthetic" (08/28/2018)  . PVC (premature ventricular contraction)    Takes metoprolol PRN for  pvc's    Medications:   (Not in a  hospital admission) Scheduled:  . heparin  4,000 Units Intravenous Once  . sodium chloride (PF)       PRN:   Assessment: 53 yoF with PMH Graves dz, Hx breast cancer HLD, HTN, diverticulosis, presents with R sided CP and SOB. Underwent R TKA on 10/9 and subsequently was treated for PNA; endorses reduced mobility latedly. CTA shows several small PE in R lung. RH strain present on CT but given negative troponin and BNP, suspect this may be d/t pre-existing pulm HTN. Pharmacy to dose heparin infusion.   Baseline INR, aPTT: pending; WNL in September 2019  Prior anticoagulation: none  Significant events:  Today, 10/03/2018:  CBC: Plt fairly stable WNL; Hgb below baseline but > 10g; no reports of any bleeding  No bleeding or infusion issues per nursing  CrCl: 53 ml/min  Goal of Therapy: Heparin level 0.3-0.7 units/ml Monitor platelets by anticoagulation protocol: Yes  Plan:  Heparin 4000 units IV bolus x 1  Heparin 1000 units/hr IV infusion  Check heparin level 8 hrs after start  Daily CBC, daily heparin level once stable  Monitor for signs of bleeding or thrombosis   Reuel Boom, PharmD, BCPS [phone] 10/03/2018, 1:09 PM  ADDENDUM  Seen by admitting MD who felt comfortable transitioning to Xarelto tonight  No interacting medications; CrCl appropriate for VTE treatment   Begin Xarelto 15 mg PO bid x 21 days, then 20 mg PO daily thereafter  Turn off heparin  with first dose of [place] to provide patient education prior to discharge  Recommend checking with Case Management to determine insurance coverage prior to discharge  Reuel Boom, PharmD, BCPS [phone] 10/03/2018, 3:43 PM

## 2018-10-03 NOTE — ED Notes (Signed)
ED TO INPATIENT HANDOFF REPORT  Name/Age/Gender [name] 71 y.o. female  Code Status Code Status History    Date Active Date Inactive Code Status Order ID Comments User Context   08/28/2018 1319 08/29/2018 1838 Full Code [id number]  Cherylann Ratel, PA-C Inpatient      Home/SNF/Other Home  Chief Complaint chest pain / short of breath   Level of Care/Admitting Diagnosis ED Disposition    ED Disposition Condition [facility]: [facility] [100102]  Level of Care: Telemetry [5]  Admit to tele based on following criteria: Complex arrhythmia (Bradycardia/Tachycardia)  Diagnosis: PE (pulmonary thromboembolism) [facility]) [921194]  Admitting Physician: Junius Argyle  Attending Physician: Annita Brod [[id number]]  PT Class (Do Not Modify): Observation [104]  PT Acc Code (Do Not Modify): Observation [10022]       Medical History Past Medical History:  Diagnosis Date  . Adenomatous colon polyp 03/07/2012  . Allergic rhinitis   . Arthritis    "knees" (08/28/2018)  . Breast cancer, right breast ([place]) 05/20/13   "no chemo" (08/28/2018)  . Chronic lower back pain   . Diverticulosis   . Graves' disease    2011  . Graves' disease   . History of kidney stones   . HLD (hyperlipidemia)   . Hx of radiation therapy 08/13/13- 09/06/13   right breast 4250 cGy 17 sessions  . Hypertension   . IBS (irritable bowel syndrome)   . Lumbar herniated disc    L4-L5  . Osteopenia   . Personal history of radiation therapy   . PONV (postoperative nausea and vomiting)    "when I have general anesthetic" (08/28/2018)  . PVC (premature ventricular contraction)    Takes metoprolol PRN for  pvc's    Allergies Allergies  Allergen Reactions  . Dilaudid [Hydromorphone Hcl] Nausea And Vomiting  . Terak [Terramycin] Rash  . Amoxicillin Diarrhea    Has patient had a PCN reaction causing immediate rash, facial/tongue/throat swelling, SOB or  lightheadedness with hypotension: No Has patient had a PCN reaction causing severe rash involving mucus membranes or skin necrosis: No Has patient had a PCN reaction that required hospitalization: No Has patient had a PCN reaction occurring within the last 10 years: Unknown If all of the above answers are "NO", then may proceed with Cephalosporin use.     IV Location/Drains/Wounds Patient Lines/Drains/Airways Status   Active Line/Drains/Airways    Name:   Placement date:   Placement time:   Site:   Days:   Peripheral IV 10/03/18 Left Forearm   10/03/18    1048    Forearm   less than 1   Urethral Catheter A.Adamson Latex;Straight-tip 16 Fr.   08/28/18    0743    Latex;Straight-tip   36   Incision 06/11/13 Chest Right   06/11/13    1143     1940   Incision (Closed) 08/28/18 Knee Right   08/28/18    0906     36          Labs/Imaging Results for orders placed or performed during the hospital encounter of 10/03/18 (from the past 48 hour(s))  Basic metabolic panel     Status: Abnormal   Collection Time: 10/03/18 10:44 AM  Result Value Ref Range   Sodium 133 (L) 135 - 145 mmol/L   Potassium 3.9 3.5 - 5.1 mmol/L   Chloride 100 98 - 111 mmol/L   CO2 25 22 - 32 mmol/L  Glucose, Bld 98 70 - 99 mg/dL   BUN 12 8 - 23 mg/dL   Creatinine, Ser 0.83 0.44 - 1.00 mg/dL   Calcium 8.8 (L) 8.9 - 10.3 mg/dL   GFR calc non Af Amer >60 >60 mL/min   GFR calc Af Amer >60 >60 mL/min    Comment: (NOTE) The eGFR has been calculated using the CKD EPI equation. This calculation has not been validated in all clinical situations. eGFR's persistently <60 mL/min signify possible Chronic Kidney Disease.    Anion gap 8 5 - 15    Comment: Performed at [facility], [place] [address], [place], [place] [address]  Brain natriuretic peptide     Status: None   Collection Time: 10/03/18 10:45 AM  Result Value Ref Range   B Natriuretic Peptide 22.4 0.0 - 100.0 pg/mL    Comment: Performed at [facility], [place] [address] [place], [place] [address]  APTT     Status: None   Collection Time: 10/03/18 10:54 AM  Result Value Ref Range   aPTT 29 24 - 36 seconds    Comment: Performed at [facility], [place] [address] [place], [place] [address]  Protime-INR     Status: None   Collection Time: 10/03/18 10:54 AM  Result Value Ref Range   Prothrombin Time 13.8 11.4 - 15.2 seconds   INR 1.07     Comment: Performed at [facility], [place] [address], [place], [place] [address]  I-stat troponin, ED     Status: None   Collection Time: 10/03/18 10:55 AM  Result Value Ref Range   Troponin i, poc 0.00 0.00 - 0.08 ng/mL   Comment 3            Comment: Due to the release kinetics of cTnI, a negative result within the first hours of the onset of symptoms does not rule out myocardial infarction with certainty. If myocardial infarction is still suspected, repeat the test at appropriate intervals.   CBC with Differential     Status: Abnormal   Collection Time: 10/03/18  1:15 PM  Result Value Ref Range   WBC 13.1 (H) 4.0 - 10.5 K/uL   RBC 3.50 (L) 3.87 - 5.11 MIL/uL   Hemoglobin 11.8 (L) 12.0 - 15.0 g/dL   HCT 36.7 36.0 - 46.0 %   MCV 104.9 (H) 80.0 - 100.0 fL   MCH 33.7 26.0 - 34.0 pg   MCHC 32.2 30.0 - 36.0 g/dL   RDW 13.0 11.5 - 15.5 %   Platelets 244 150 - 400 K/uL   nRBC 0.0 0.0 - 0.2 %   Neutrophils Relative % 85 %   Neutro Abs 11.0 (H) 1.7 - 7.7 K/uL   Lymphocytes Relative 5 %   Lymphs Abs 0.7 0.7 - 4.0 K/uL   Monocytes Relative 9 %   Monocytes Absolute 1.2 (H) 0.1 - 1.0 K/uL   Eosinophils Relative 0 %   Eosinophils Absolute 0.0 0.0 - 0.5 K/uL   Basophils Relative 0 %   Basophils Absolute 0.1 0.0 - 0.1 K/uL   Immature Granulocytes 1 %   Abs Immature Granulocytes 0.09 (H) 0.00 - 0.07 K/uL    Comment: Performed at [facility], [place] [address], [place], [place] [address]   Ct Angio Chest Pe W And/or Wo  Contrast  Result Date: 10/03/2018 CLINICAL DATA:  Suspect pulmonary embolism.  Recent pneumonia. EXAM: CT ANGIOGRAPHY CHEST WITH CONTRAST TECHNIQUE: Multidetector CT imaging of  the chest was performed using the standard protocol during bolus administration of intravenous contrast. Multiplanar CT image reconstructions and MIPs were obtained to evaluate the vascular anatomy. CONTRAST:  17m ISOVUE-370 IOPAMIDOL (ISOVUE-370) INJECTION 76% COMPARISON:  Chest two-view 09/26/2018 FINDINGS: Cardiovascular: Small volume pulmonary emboli. Small peripheral emboli in the right lower lobe more right middle lobe, and lingula. Pulmonary arteries not enlarged. RV/LV ratio 1.3 suggesting right heart strain. No pericardial effusion. Normal thoracic aorta Mediastinum/Nodes: Negative for mass or adenopathy Lungs/Pleura: Small right pleural effusion. Patchy airspace disease in the right lung base may reflect pulmonary infarct or pneumonia. Mild airspace disease right middle lobe. No effusion on the left. Minimal dependent atelectasis left lung base. Upper Abdomen: Negative Musculoskeletal: Negative Review of the MIP images confirms the above findings. IMPRESSION: Small volume pulmonary emboli most prominent right lower lobe. Small right effusion and right lower lobe airspace disease likely due to pulmonary infarction RV/LV ratio 1.3 suggesting right heart strain. Given the relatively small volume pulmonary emboli, the patient may have pre-existing pulmonary hypertension. These results were called by telephone at the time of interpretation on 10/03/2018 at 12:49 pm to Dr. JIsla Pence, who verbally acknowledged these results. Electronically Signed   By: CFranchot GalloM.D.   On: 10/03/2018 12:49   EKG Interpretation  Date/Time:  Wednesday October 03 2018 10:21:49 EST Ventricular Rate:  93 PR Interval:    QRS Duration: 88 QT Interval:  392 QTC Calculation: 488 R Axis:   49 Text Interpretation:  Sinus rhythm Probable  left atrial enlargement Probable anterior infarct, age indeterminate No old tracing to compare Confirmed by HIsla Pence([phone] on 10/03/2018 10:26:16 AM Also confirmed by HIsla [phone], editor SLynder Parents([phone]  on 10/03/2018 10:33:37 AM   Pending Labs Unresulted Labs (From admission, onward)    Start     Ordered   10/04/18 0500  CBC  Daily,   R     10/03/18 1329   10/03/18 2200  Heparin level (unfractionated)  Once-Timed,   R     10/03/18 1329          Vitals/Pain Today's Vitals   10/03/18 1130 10/03/18 1145 10/03/18 1200 10/03/18 1300  BP: 108/74  107/67 127/69  Pulse: 85 91 80 88  Resp: '15 17 18 '$ (!) 25  Temp:      TempSrc:      SpO2: 100% 98% 99% 100%  Weight:      Height:      PainSc:        Isolation Precautions No active isolations  Medications Medications  sodium chloride (PF) 0.9 % injection (has no administration in time range)  heparin ADULT infusion 100 units/mL (25000 units/2523msodium chloride 0.45%) (1,000 Units/hr Intravenous New Bag/Given 10/03/18 1332)  iopamidol (ISOVUE-370) 76 % injection 100 mL (100 mLs Intravenous Contrast Given 10/03/18 1226)  heparin bolus via infusion 4,000 Units (4,000 Units Intravenous Bolus from Bag 10/03/18 1329)    Mobility walks

## 2018-10-04 ENCOUNTER — Observation Stay (HOSPITAL_BASED_OUTPATIENT_CLINIC_OR_DEPARTMENT_OTHER): Payer: Medicare Other

## 2018-10-04 DIAGNOSIS — I5032 Chronic diastolic (congestive) heart failure: Secondary | ICD-10-CM

## 2018-10-04 DIAGNOSIS — M1711 Unilateral primary osteoarthritis, right knee: Secondary | ICD-10-CM | POA: Diagnosis not present

## 2018-10-04 DIAGNOSIS — D539 Nutritional anemia, unspecified: Secondary | ICD-10-CM

## 2018-10-04 DIAGNOSIS — I340 Nonrheumatic mitral (valve) insufficiency: Secondary | ICD-10-CM

## 2018-10-04 DIAGNOSIS — I10 Essential (primary) hypertension: Secondary | ICD-10-CM

## 2018-10-04 DIAGNOSIS — I2699 Other pulmonary embolism without acute cor pulmonale: Principal | ICD-10-CM

## 2018-10-04 DIAGNOSIS — E038 Other specified hypothyroidism: Secondary | ICD-10-CM

## 2018-10-04 LAB — URINALYSIS, ROUTINE W REFLEX MICROSCOPIC
BILIRUBIN URINE: NEGATIVE
Bacteria, UA: NONE SEEN
GLUCOSE, UA: 50 mg/dL — AB
KETONES UR: 5 mg/dL — AB
NITRITE: NEGATIVE
Protein, ur: 100 mg/dL — AB
Specific Gravity, Urine: 1.019 (ref 1.005–1.030)
pH: 6 (ref 5.0–8.0)

## 2018-10-04 LAB — CBC
HCT: 33.2 % — ABNORMAL LOW (ref 36.0–46.0)
Hemoglobin: 10.7 g/dL — ABNORMAL LOW (ref 12.0–15.0)
MCH: 33.9 pg (ref 26.0–34.0)
MCHC: 32.2 g/dL (ref 30.0–36.0)
MCV: 105.1 fL — ABNORMAL HIGH (ref 80.0–100.0)
Platelets: 261 K/uL (ref 150–400)
RBC: 3.16 MIL/uL — ABNORMAL LOW (ref 3.87–5.11)
RDW: 12.8 % (ref 11.5–15.5)
WBC: 9.6 K/uL (ref 4.0–10.5)
nRBC: 0 % (ref 0.0–0.2)

## 2018-10-04 LAB — ECHOCARDIOGRAM COMPLETE
Height: 65.5 in
Weight: 2043.2 oz

## 2018-10-04 MED ORDER — TRAMADOL HCL 50 MG PO TABS: 50.0000 mg | ORAL_TABLET | Freq: Four times a day (QID) | ORAL | 0 refills | Status: AC | PRN

## 2018-10-04 MED ORDER — RIVAROXABAN (XARELTO) VTE STARTER PACK (15 & 20 MG): ORAL_TABLET | ORAL | 0 refills | Status: DC

## 2018-10-04 NOTE — Progress Notes (Signed)
Per Joy/ [facility]/p#[phone] Xarelto 15 mg po Bid copay is $27.99 and 20 mg po x 10 days is $13.33-patient  voiced understanding

## 2018-10-04 NOTE — Progress Notes (Signed)
  Echocardiogram 2D Echocardiogram has been performed.  [name] L Androw 10/04/2018, 9:23 AM

## 2018-10-04 NOTE — Discharge Instructions (Signed)
Information on my medicine - XARELTO (rivaroxaban)  This medication education was reviewed with me or my healthcare representative as part of my discharge preparation.  WHY WAS XARELTO PRESCRIBED FOR YOU? Xarelto was prescribed to treat blood clots that may have been found in the veins of your legs (deep vein thrombosis) or in your lungs (pulmonary embolism) and to reduce the risk of them occurring again.  What do you need to know about Xarelto? The starting dose is one 15 mg tablet taken TWICE daily with food for the FIRST 21 DAYS then on (enter date)  10/25/2018  the dose is changed to one 20 mg tablet taken ONCE A DAY with your evening meal.  DO NOT stop taking Xarelto without talking to the health care provider who prescribed the medication.  Refill your prescription for 20 mg tablets before you run out.  After discharge, you should have regular check-up appointments with your healthcare provider that is prescribing your Xarelto.  In the future your dose may need to be changed if your kidney function changes by a significant amount.  What do you do if you miss a dose? If you are taking Xarelto TWICE DAILY and you miss a dose, take it as soon as you remember. You may take two 15 mg tablets (total 30 mg) at the same time then resume your regularly scheduled 15 mg twice daily the next day.  If you are taking Xarelto ONCE DAILY and you miss a dose, take it as soon as you remember on the same day then continue your regularly scheduled once daily regimen the next day. Do not take two doses of Xarelto at the same time.   Important Safety Information Xarelto is a blood thinner medicine that can cause bleeding. You should call your healthcare provider right away if you experience any of the following: ? Bleeding from an injury or your nose that does not stop. ? Unusual colored urine (red or dark brown) or unusual colored stools (red or black). ? Unusual bruising for unknown reasons. ? A  serious fall or if you hit your head (even if there is no bleeding).  Some medicines may interact with Xarelto and might increase your risk of bleeding while on Xarelto. To help avoid this, consult your healthcare provider or pharmacist prior to using any new prescription or non-prescription medications, including herbals, vitamins, non-steroidal anti-inflammatory drugs (NSAIDs) and supplements.  This website has more information on Xarelto: [email or web address]

## 2018-10-04 NOTE — Care Management Note (Signed)
Case Management Note  Patient Details  Name: NGUYET MERCER MRN: [id number] Date of Birth: 06/06/1946  Subjective/Objective: PE. Hx: Breast Ca,PNA. From home. CM referral for Xarelto-will check benefit-await response.                   Action/Plan:d/c home.   Expected Discharge Date:  (unknown)               Expected Discharge Plan:  Home/Self Care  In-House Referral:     Discharge planning Services  CM Consult, Medication Assistance  Post Acute Care Choice:    Choice offered to:     DME Arranged:    DME Agency:     HH Arranged:    HH Agency:     Status of Service:  In process, will continue to follow  If discussed at Long Length of Stay Meetings, dates discussed:    Additional Comments:  Dessa Phi, RN 10/04/2018, 9:21 AM

## 2018-10-04 NOTE — Plan of Care (Signed)
Discharge instructions reviewed with patient, questions answered, verbalized understanding.  Patient transported to main entrance of hospital via wheelchair to be taken home by friend.

## 2018-10-04 NOTE — Discharge Summary (Signed)
Discharge Summary  [name] PPI:[id number] DOB: 05-02-1946  PCP: Hulan Fess, MD  Admit date: 10/03/2018 Discharge date: 10/04/2018  Time spent: 30 mins   Recommendations for Outpatient Follow-up:  1. PCP in 1 week  Discharge Diagnoses:  Active Hospital Problems   Diagnosis Date Noted  . PE (pulmonary thromboembolism) ([place]) 10/03/2018  . Hypothyroidism 10/03/2018  . Chronic diastolic CHF (congestive heart failure) ([place]) 10/03/2018  . Macrocytic anemia 10/03/2018  . Unilateral primary osteoarthritis, right knee 08/28/2018  . Essential hypertension 08/26/2016    Resolved Hospital Problems  No resolved problems to display.    Discharge Condition: Stable   Diet recommendation: Regular  Vitals:   10/04/18 0502 10/04/18 1200  BP: 100/64 115/73  Pulse: 69 70  Resp: 18   Temp: 99.8 F (37.7 C) 98.6 F (37 C)  SpO2: 97% 99%    History of present illness:  [name] is a 72 y.o. female with medical history significant for hypothyroidism and breast cancer, treated who underwent a right total knee replacement about a month ago.  Postop, no issues although she developed a pneumonia and was treated for 1 week.  Her cough continued to persist so she was continued on her Levaquin.  On 11/12, she started experiencing right-sided pain, especially with deep inspiration, continued persistent cough and some dyspnea on exertion. In the ED, patient was noted to have normal oxygen saturations.  White blood cell count elevated at 13.1.  Hemoglobin improved from a month prior.  Normal renal function.  Troponin and BNP normal. Patient underwent a CT scan of the chest noting small volume pulmonary emboli most prominently in the right lower lobe with small right effusion and right lower lobe airway disease likely due to pulmonary infarction.  Patient also noted to have some mild right heart strain, but given relatively small volume pulmonary emboli there is a question of whether this  was pre-existing pulmonary hypertension. (Patient had an echocardiogram done in 2017 which noted no signs of pulmonary hypertension, only mild diastolic dysfunction).  Patient started on heparin drip and hospitalist were called for further evaluation.   Today, pt reported feeling better, pleuritic chest pain has improved but still require some pain meds. Denies any SOB, worsening chest pain, dizziness, fever/chills, abdominal pain. Of note, pt reported hematuria, no dysuria. She reported chronic microscopic hematuria for years which she was told is due to kidney stones. Denies any current flank pain.  Hospital Course:  Principal Problem:   PE (pulmonary thromboembolism) ([place]) Active Problems:   Essential hypertension   Unilateral primary osteoarthritis, right knee   Hypothyroidism   Chronic diastolic CHF (congestive heart failure) ([facility])   Macrocytic anemia  Pulmonary thromboembolism with infarction Afebrile with resolved leukocytosis Currently saturating well on RA Provoked likely due to recent knee surgery No need for LE doppler, as unlikely to change management ECHO with EF of 60-65%, Grade 1DD, wall motion normal, RV without any evidence of strain. Systolic pressure within normal range S/P IV heparin, switch to Xarelto for about 6 months duration Follow up with PCP with repeat labs  Hematuria Denies any dysuria, fever/chills UA pending collection Has a hx of microscopic hematuria and was told it was due to kidney stones, ?? Worsening due to [facility] Advised pt to monitor it closely and seek medical attention if gets heavier or doesn't resolve Advised to follow up with PCP to monitor closely and follow CBC  Essential hypertension Stable Continue home meds  Unilateral primary osteoarthritis, right knee Status  post knee replacement a month ago Follow up with Orthopedics    Hypothyroidism Continue Synthroid    Chronic diastolic HF Stable.  BNP normal ECHO as above    Macrocytic  anemia of unclear etiology B12 and folate checked last month and stable Follow up with PCP given hematuria   Procedures:  None   Consultations:  None  Discharge Exam: BP 115/73 (BP Location: Left Arm)   Pulse 70   Temp 98.6 F (37 C) (Oral)   Resp 18   Ht 5' 5.5" (1.664 m)   Wt 57.9 kg   SpO2 99%   BMI 20.93 kg/m   General: NAD  Cardiovascular: S1, S2 present Respiratory: CTAB  Discharge Instructions You were cared for by a hospitalist during your hospital stay. If you have any questions about your discharge medications or the care you received while you were in the hospital after you are discharged, you can call the unit and asked to speak with the hospitalist on call if the hospitalist that took care of you is not available. Once you are discharged, your primary care physician will handle any further medical issues. Please note that NO REFILLS for any discharge medications will be authorized once you are discharged, as it is imperative that you return to your primary care physician (or establish a relationship with a primary care physician if you do not have one) for your aftercare needs so that they can reassess your need for medications and monitor your lab values.   Allergies as of 10/04/2018      Reactions   Dilaudid [hydromorphone Hcl] Nausea And Vomiting   Terak [terramycin] Rash   Amoxicillin Diarrhea   Has patient had a PCN reaction causing immediate rash, facial/tongue/throat swelling, SOB or lightheadedness with hypotension: No Has patient had a PCN reaction causing severe rash involving mucus membranes or skin necrosis: No Has patient had a PCN reaction that required hospitalization: No Has patient had a PCN reaction occurring within the last 10 years: Unknown If all of the above answers are "NO", then may proceed with Cephalosporin use.      Medication List    STOP taking these medications   aspirin 81 MG chewable tablet   ibuprofen 200 MG  tablet Commonly known as:  ADVIL,MOTRIN   levofloxacin 750 MG tablet Commonly known as:  LEVAQUIN     TAKE these medications   acetaminophen 325 MG tablet Commonly known as:  TYLENOL Take 2 tablets (650 mg total) by mouth every 6 (six) hours.   ALIGN PREBIOTIC-PROBIOTIC PO Take 1 tablet by mouth daily.   buPROPion 300 MG 24 hr tablet Commonly known as:  WELLBUTRIN XL Take 300 mg by mouth daily.   Echinacea 350 MG Caps Take 350 capsules by mouth 2 (two) times daily.   fluticasone 50 MCG/ACT nasal spray Commonly known as:  FLONASE Place 2 sprays into the nose daily.   guaiFENesin 600 MG 12 hr tablet Commonly known as:  MUCINEX Take by mouth 2 (two) times daily.   LIPITOR 10 MG tablet Generic drug:  atorvastatin Take 10 mg by mouth every evening.   lisinopril 10 MG tablet Commonly known as:  PRINIVIL,ZESTRIL Take 1 tablet (10 mg total) by mouth daily. NEED OV. What changed:  when to take this   methocarbamol 500 MG tablet Commonly known as:  ROBAXIN Take 1 tablet (500 mg total) by mouth every 8 (eight) hours as needed for muscle spasms.   metoprolol tartrate 25 MG tablet Commonly known  as:  LOPRESSOR Take 25 mg 1 hour before Cardiac CT What changed:    how much to take  how to take this  when to take this  reasons to take this  additional instructions   multivitamin tablet Take 1 tablet by mouth daily.   Rivaroxaban 15 & 20 MG Tbpk Take as directed on package: Start with one 15mg  tablet by mouth twice a day with food. On Day 22, switch to one 20mg  tablet once a day   SYNTHROID 88 MCG tablet Generic drug:  levothyroxine Take 88 mcg by mouth every other day.   SYNTHROID 100 MCG tablet Generic drug:  levothyroxine Take 100 mcg by mouth every other day.   tamoxifen 20 MG tablet Commonly known as:  NOLVADEX Take 1 tablet (20 mg total) by mouth daily.   traMADol 50 MG tablet Commonly known as:  ULTRAM Take 1 tablet (50 mg total) by mouth every 6  (six) hours as needed for up to 7 days for moderate pain.   TURMERIC PO Take 1 capsule by mouth daily.      Allergies  Allergen Reactions  . Dilaudid [Hydromorphone Hcl] Nausea And Vomiting  . Terak [Terramycin] Rash  . Amoxicillin Diarrhea    Has patient had a PCN reaction causing immediate rash, facial/tongue/throat swelling, SOB or lightheadedness with hypotension: No Has patient had a PCN reaction causing severe rash involving mucus membranes or skin necrosis: No Has patient had a PCN reaction that required hospitalization: No Has patient had a PCN reaction occurring within the last 10 years: Unknown If all of the above answers are "NO", then may proceed with Cephalosporin use.    Follow-up Information    Little, Lennette Bihari, MD. Schedule an appointment as soon as possible for a visit in 1 week(s).   Specialty:  Family Medicine Contact information: [place] [address] [phone]            The results of significant diagnostics from this hospitalization (including imaging, microbiology, ancillary and laboratory) are listed below for reference.    Significant Diagnostic Studies: Ct Angio Chest Pe W And/or Wo Contrast  Result Date: 10/03/2018 CLINICAL DATA:  Suspect pulmonary embolism.  Recent pneumonia. EXAM: CT ANGIOGRAPHY CHEST WITH CONTRAST TECHNIQUE: Multidetector CT imaging of the chest was performed using the standard protocol during bolus administration of intravenous contrast. Multiplanar CT image reconstructions and MIPs were obtained to evaluate the vascular anatomy. CONTRAST:  166mL ISOVUE-370 IOPAMIDOL (ISOVUE-370) INJECTION 76% COMPARISON:  Chest two-view 09/26/2018 FINDINGS: Cardiovascular: Small volume pulmonary emboli. Small peripheral emboli in the right lower lobe more right middle lobe, and lingula. Pulmonary arteries not enlarged. RV/LV ratio 1.3 suggesting right heart strain. No pericardial effusion. Normal thoracic aorta  Mediastinum/Nodes: Negative for mass or adenopathy Lungs/Pleura: Small right pleural effusion. Patchy airspace disease in the right lung base may reflect pulmonary infarct or pneumonia. Mild airspace disease right middle lobe. No effusion on the left. Minimal dependent atelectasis left lung base. Upper Abdomen: Negative Musculoskeletal: Negative Review of the MIP images confirms the above findings. IMPRESSION: Small volume pulmonary emboli most prominent right lower lobe. Small right effusion and right lower lobe airspace disease likely due to pulmonary infarction RV/LV ratio 1.3 suggesting right heart strain. Given the relatively small volume pulmonary emboli, the patient may have pre-existing pulmonary hypertension. These results were called by telephone at the time of interpretation on 10/03/2018 at 12:49 pm to Dr. Isla Pence , who verbally acknowledged these results. Electronically Signed  By: Franchot Gallo M.D.   On: 10/03/2018 12:49   Xr Knee 1-2 Views Right  Result Date: 09/14/2018 Films of the right total knee replaced reveal excellent position.  No complications.  Good alignment of the components.   Microbiology: No results found for this or any previous visit (from the past 240 hour(s)).   Labs: Basic Metabolic Panel: Recent Labs  Lab 10/03/18 1044  NA 133*  K 3.9  CL 100  CO2 25  GLUCOSE 98  BUN 12  CREATININE 0.83  CALCIUM 8.8*   Liver Function Tests: No results for input(s): AST, ALT, ALKPHOS, BILITOT, PROT, ALBUMIN in the last 168 hours. No results for input(s): LIPASE, AMYLASE in the last 168 hours. No results for input(s): AMMONIA in the last 168 hours. CBC: Recent Labs  Lab 10/03/18 1315 10/04/18 0531  WBC 13.1* 9.6  NEUTROABS 11.0*  --   HGB 11.8* 10.7*  HCT 36.7 33.2*  MCV 104.9* 105.1*  PLT 244 261   Cardiac Enzymes: No results for input(s): CKTOTAL, CKMB, CKMBINDEX, TROPONINI in the last 168 hours. BNP: BNP (last 3 results) Recent Labs     10/03/18 1045  BNP 22.4    ProBNP (last 3 results) No results for input(s): PROBNP in the last 8760 hours.  CBG: No results for input(s): GLUCAP in the last 168 hours.     Signed:  Alma Friendly, MD [facility] 10/04/2018, 12:58 PM

## 2018-10-05 ENCOUNTER — Telehealth: Payer: Self-pay | Admitting: *Deleted

## 2018-10-05 ENCOUNTER — Telehealth: Payer: Self-pay | Admitting: Hematology and Oncology

## 2018-10-05 NOTE — Telephone Encounter (Signed)
Called patient per 11/15 sch message - unable to reach patient - left message for patient to call back to r/s missed appt.

## 2018-10-05 NOTE — Telephone Encounter (Signed)
Received TC from patient stating that she was unable to come to appt today with Dr. Lindi Adie as she has been hospitalized with PE. She is in [facility] room 1412. She expects to go home in a couple of days.

## 2018-10-07 ENCOUNTER — Other Ambulatory Visit: Payer: Self-pay | Admitting: Cardiology

## 2018-10-08 DIAGNOSIS — M25661 Stiffness of right knee, not elsewhere classified: Secondary | ICD-10-CM | POA: Diagnosis not present

## 2018-10-08 DIAGNOSIS — R262 Difficulty in walking, not elsewhere classified: Secondary | ICD-10-CM | POA: Diagnosis not present

## 2018-10-08 DIAGNOSIS — Z96651 Presence of right artificial knee joint: Secondary | ICD-10-CM | POA: Diagnosis not present

## 2018-10-11 ENCOUNTER — Telehealth: Payer: Self-pay

## 2018-10-11 DIAGNOSIS — R262 Difficulty in walking, not elsewhere classified: Secondary | ICD-10-CM | POA: Diagnosis not present

## 2018-10-11 DIAGNOSIS — M25661 Stiffness of right knee, not elsewhere classified: Secondary | ICD-10-CM | POA: Diagnosis not present

## 2018-10-11 DIAGNOSIS — Z96651 Presence of right artificial knee joint: Secondary | ICD-10-CM | POA: Diagnosis not present

## 2018-10-11 DIAGNOSIS — D500 Iron deficiency anemia secondary to blood loss (chronic): Secondary | ICD-10-CM | POA: Diagnosis not present

## 2018-10-11 DIAGNOSIS — Z86711 Personal history of pulmonary embolism: Secondary | ICD-10-CM | POA: Diagnosis not present

## 2018-10-11 DIAGNOSIS — R8290 Unspecified abnormal findings in urine: Secondary | ICD-10-CM | POA: Diagnosis not present

## 2018-10-11 NOTE — Telephone Encounter (Signed)
Patient left voicemail stating she needs to reschedule a follow up appointment with Dr. Lindi Adie that was cancelled d/t being hospitalized.    Nurse returned call, left voicemail inform patient that scheduling message will be sent.

## 2018-10-12 ENCOUNTER — Telehealth: Payer: Self-pay | Admitting: Hematology and Oncology

## 2018-10-12 ENCOUNTER — Encounter: Payer: Self-pay | Admitting: Hematology and Oncology

## 2018-10-12 NOTE — Telephone Encounter (Signed)
Called both numbers - unable to reach pt. Left message for patient with appt date and time.

## 2018-10-15 ENCOUNTER — Inpatient Hospital Stay (HOSPITAL_BASED_OUTPATIENT_CLINIC_OR_DEPARTMENT_OTHER): Payer: Medicare Other | Admitting: Hematology and Oncology

## 2018-10-15 ENCOUNTER — Inpatient Hospital Stay: Payer: Medicare Other

## 2018-10-15 ENCOUNTER — Telehealth: Payer: Self-pay | Admitting: Hematology and Oncology

## 2018-10-15 VITALS — BP 123/80 | HR 80 | Temp 98.1°F | Resp 17 | Ht 65.5 in | Wt 124.2 lb

## 2018-10-15 DIAGNOSIS — C50411 Malignant neoplasm of upper-outer quadrant of right female breast: Secondary | ICD-10-CM

## 2018-10-15 DIAGNOSIS — R630 Anorexia: Secondary | ICD-10-CM

## 2018-10-15 DIAGNOSIS — J90 Pleural effusion, not elsewhere classified: Secondary | ICD-10-CM

## 2018-10-15 DIAGNOSIS — Z170 Estrogen receptor positive status [ER+]: Secondary | ICD-10-CM

## 2018-10-15 DIAGNOSIS — R319 Hematuria, unspecified: Secondary | ICD-10-CM

## 2018-10-15 DIAGNOSIS — Z96659 Presence of unspecified artificial knee joint: Secondary | ICD-10-CM

## 2018-10-15 DIAGNOSIS — T733XXA Exhaustion due to excessive exertion, initial encounter: Secondary | ICD-10-CM

## 2018-10-15 DIAGNOSIS — R310 Gross hematuria: Secondary | ICD-10-CM | POA: Diagnosis not present

## 2018-10-15 DIAGNOSIS — I2699 Other pulmonary embolism without acute cor pulmonale: Secondary | ICD-10-CM | POA: Diagnosis not present

## 2018-10-15 DIAGNOSIS — R5383 Other fatigue: Secondary | ICD-10-CM | POA: Diagnosis not present

## 2018-10-15 DIAGNOSIS — R634 Abnormal weight loss: Secondary | ICD-10-CM | POA: Diagnosis not present

## 2018-10-15 DIAGNOSIS — N200 Calculus of kidney: Secondary | ICD-10-CM | POA: Diagnosis not present

## 2018-10-15 MED ORDER — RIVAROXABAN 20 MG PO TABS: 20.0000 mg | ORAL_TABLET | Freq: Every day | ORAL | 4 refills | Status: DC

## 2018-10-15 MED ORDER — LETROZOLE 2.5 MG PO TABS: 2.5000 mg | ORAL_TABLET | Freq: Every day | ORAL | 3 refills | Status: DC

## 2018-10-15 NOTE — Assessment & Plan Note (Signed)
Right breast invasive lobular cancer with LCIS T1 N0 M0 stage IA status post lumpectomy which did not show any further evidence of invasive breast cancer. ER positive PR positive HER-2 negative, received adjuvant radiation completed 09/04/2013, started her tamoxifen 20 mg daily from November 2013  Tamoxifen toxicities:  1.  Pulmonary embolism 10/03/2018: Started on Xarelto Because of this we will discontinue tamoxifen and switch her to letrozole  Breast cancer surveillance: 1. Breast exam  10/15/2018  does not reveal any palpable lumps or nodules of concern. 2. Mammogram 08/28/2017 is normal, breast density category B, new mammogram will need to be obtained 3. Breast MRI 06/25/2018: Negative, plan to do MRIs every other year  CT chest 10/03/2018: Small volume pulmonary emboli noted right lower lobe, small right pleural effusion and airspace opacity likely due to pulmonary infarction  Current treatment: Xarelto Given the fact that she is on tamoxifen, we would like to discontinue tamoxifen and switch her to letrozole  Return to clinic in 1 year for follow-up

## 2018-10-15 NOTE — Progress Notes (Signed)
Patient Care Team: Hulan Fess, MD as PCP - General (Family Medicine)  DIAGNOSIS:  Encounter Diagnosis  Name Primary?  . Malignant neoplasm of upper-outer quadrant of right breast in female, estrogen receptor positive ([place])     SUMMARY OF ONCOLOGIC HISTORY:   Breast cancer of upper-outer quadrant of right female breast ([place])   04/23/2013 Initial Diagnosis    Right breast calcifications stereotactic biopsy revealed invasive lobular cancer with LCIS, lymph node benign, ER positive PR negative Ki-67 10%    06/11/2013 Surgery    Right breast lumpectomy: Lobular carcinoma in situ, no invasive disease found, sentinel nodes negative    08/06/2013 - 09/04/2013 Radiation Therapy    Adjuvant radiation therapy    10/03/2013 -  Anti-estrogen oral therapy    Tamoxifen 20 mg daily     CHIEF COMPLIANT: Recent hospitalization for pulmonary emboli, complains of severe fatigue  INTERVAL HISTORY: [name] is a 72 year old with above-mentioned history of right breast cancer who was admitted to the hospital with pulmonary emboli after undergoing knee replacement surgery.  She was placed on Xarelto and discharged home.  Since hospitalization she has had hematuria.  She had previous kidney stones and is planning to see urology next week.  Her biggest complaint today is profound fatigue and that she was not found to be anemic or iron deficient at the recent checkup at her primary care office.  She is also not been able to eat because of diminished appetite.  Because this she has lost some weight.  Shortness of breath has improved remarkably.  REVIEW OF SYSTEMS:   Constitutional: Severe fatigue and weight loss Eyes: Denies blurriness of vision Ears, nose, mouth, throat, and face: Denies mucositis or sore throat Respiratory: Improvement in shortness of breath Cardiovascular: Denies palpitation, chest discomfort Gastrointestinal: Decreased appetite Skin: Denies abnormal skin rashes Lymphatics:  Denies new lymphadenopathy or easy bruising Neurological:Denies numbness, tingling or new weaknesses Behavioral/Psych: Mood is stable, no new changes  Extremities: No lower extremity edema   All other systems were reviewed with the patient and are negative.  I have reviewed the past medical history, past surgical history, social history and family history with the patient and they are unchanged from previous note.  ALLERGIES:  is allergic to dilaudid [hydromorphone hcl]; terak [terramycin]; and amoxicillin.  MEDICATIONS:  Current Outpatient Medications  Medication Sig Dispense Refill  . acetaminophen (TYLENOL) 325 MG tablet Take 2 tablets (650 mg total) by mouth every 6 (six) hours.    Marland Kitchen atorvastatin (LIPITOR) 10 MG tablet Take 10 mg by mouth every evening.     . Bacillus Coagulans-Inulin (ALIGN PREBIOTIC-PROBIOTIC PO) Take 1 tablet by mouth daily.    Marland Kitchen buPROPion (WELLBUTRIN XL) 300 MG 24 hr tablet Take 300 mg by mouth daily.    . Echinacea 350 MG CAPS Take 350 capsules by mouth 2 (two) times daily.    . fluticasone (FLONASE) 50 MCG/ACT nasal spray Place 2 sprays into the nose daily.     Marland Kitchen guaiFENesin (MUCINEX) 600 MG 12 hr tablet Take by mouth 2 (two) times daily.    Marland Kitchen lisinopril (PRINIVIL,ZESTRIL) 10 MG tablet TAKE 1 TABLET BY MOUTH DAILY. 30 tablet 6  . methocarbamol (ROBAXIN) 500 MG tablet Take 1 tablet (500 mg total) by mouth every 8 (eight) hours as needed for muscle spasms. 30 tablet 0  . metoprolol tartrate (LOPRESSOR) 25 MG tablet Take 25 mg 1 hour before Cardiac CT (Patient taking differently: Take 25 mg by mouth as needed (for  PVC). ) 1 tablet 0  . Multiple Vitamin (MULTIVITAMIN) tablet Take 1 tablet by mouth daily.    . Rivaroxaban 15 & 20 MG TBPK Take as directed on package: Start with one '15mg'$  tablet by mouth twice a day with food. On Day 22, switch to one '20mg'$  tablet once a day 51 each 0  . SYNTHROID 100 MCG tablet Take 100 mcg by mouth every other day.     Marland Kitchen SYNTHROID 88  MCG tablet Take 88 mcg by mouth every other day.     . tamoxifen (NOLVADEX) 20 MG tablet Take 1 tablet (20 mg total) by mouth daily. 90 tablet 3  . TURMERIC PO Take 1 capsule by mouth daily.     No current facility-administered medications for this visit.     PHYSICAL EXAMINATION: ECOG PERFORMANCE STATUS: 1 - Symptomatic but completely ambulatory  Vitals:   10/15/18 1145  BP: 123/80  Pulse: 80  Resp: 17  Temp: 98.1 F (36.7 C)  SpO2: 100%   Filed Weights   10/15/18 1145  Weight: 124 lb 3.2 oz (56.3 kg)    GENERAL:alert, no distress and comfortable SKIN: skin color, texture, turgor are normal, no rashes or significant lesions EYES: normal, Conjunctiva are pink and non-injected, sclera clear OROPHARYNX:no exudate, no erythema and lips, buccal mucosa, and tongue normal  NECK: supple, thyroid normal size, non-tender, without nodularity LYMPH:  no palpable lymphadenopathy in the cervical, axillary or inguinal LUNGS: clear to auscultation and percussion with normal breathing effort HEART: regular rate & rhythm and no murmurs and no lower extremity edema ABDOMEN:abdomen soft, non-tender and normal bowel sounds MUSCULOSKELETAL:no cyanosis of digits and no clubbing  NEURO: alert & oriented x 3 with fluent speech, no focal motor/sensory deficits EXTREMITIES: No lower extremity edema   LABORATORY DATA:  I have reviewed the data as listed CMP Latest Ref Rng & Units 10/03/2018 08/29/2018 08/17/2018  Glucose 70 - 99 mg/dL 98 73 75  BUN 8 - 23 mg/dL '12 8 15  '$ Creatinine 0.44 - 1.00 mg/dL 0.83 0.51 0.84  Sodium 135 - 145 mmol/L 133(L) 136 139  Potassium 3.5 - 5.1 mmol/L 3.9 4.6 4.0  Chloride 98 - 111 mmol/L 100 108 108  CO2 22 - 32 mmol/L 25 19(L) 23  Calcium 8.9 - 10.3 mg/dL 8.8(L) 7.8(L) 9.3  Total Protein 6.5 - 8.1 g/dL - - 6.3(L)  Total Bilirubin 0.3 - 1.2 mg/dL - - 0.7  Alkaline Phos 38 - 126 U/L - - 42  AST 15 - 41 U/L - - 30  ALT 0 - 44 U/L - - 20    Lab Results  Component  Value Date   WBC 9.6 10/04/2018   HGB 10.7 (L) 10/04/2018   HCT 33.2 (L) 10/04/2018   MCV 105.1 (H) 10/04/2018   PLT 261 10/04/2018   NEUTROABS 11.0 (H) 10/03/2018    ASSESSMENT & PLAN:  Breast cancer of upper-outer quadrant of right female breast Right breast invasive lobular cancer with LCIS T1 N0 M0 stage IA status post lumpectomy which did not show any further evidence of invasive breast cancer. ER positive PR positive HER-2 negative, received adjuvant radiation completed 09/04/2013, started her tamoxifen 20 mg daily from November 2013  Tamoxifen toxicities:  1.  Pulmonary embolism 10/03/2018: Started on Xarelto Because of this we will discontinue tamoxifen and switch her to letrozole  Breast cancer surveillance: 1. Breast exam  10/15/2018  does not reveal any palpable lumps or nodules of concern. 2. Mammogram 08/28/2017 is normal,  breast density category B, new mammogram will need to be obtained 3. Breast MRI 06/25/2018: Negative, plan to do MRIs every other year  CT chest 10/03/2018: Small volume pulmonary emboli noted right lower lobe, small right pleural effusion and airspace opacity likely due to pulmonary infarction Risk factors: Knee replacement surgery and being on tamoxifen Current treatment: Xarelto for 6 months started November 2019 Given the fact that she is on tamoxifen, we would like to discontinue tamoxifen and switch her to letrozole.  Letrozole will begin end of December or January.  Hematuria: I did advise her to cut down the loading dose of Xarelto to once a day for a couple of days to allow the hematuria to stop and then she can go back to the full loading dose.  I gave her a new prescription for Xarelto maintenance therapy dose.  She will speak seeing a urology.  Severe fatigue: I discussed with her that it could be related to the pulmonary embolism.  Could take several weeks for her to feel better.  We wanted to check thyroid function panel to see if that is  any cause for concern.  We will call her with the results of this test.  Return to clinic in 3 months for follow-up     No orders of the defined types were placed in this encounter.  The patient has a good understanding of the overall plan. she agrees with it. she will call with any problems that may develop before the next visit here.   Harriette Ohara, MD 10/15/18

## 2018-10-15 NOTE — Telephone Encounter (Signed)
Patient declined avs and calendar  °

## 2018-10-16 ENCOUNTER — Telehealth: Payer: Self-pay | Admitting: Hematology and Oncology

## 2018-10-16 LAB — THYROID PANEL WITH TSH
FREE THYROXINE INDEX: 2.6 (ref 1.2–4.9)
T3 Uptake Ratio: 23 % — ABNORMAL LOW (ref 24–39)
T4, Total: 11.1 ug/dL (ref 4.5–12.0)
TSH: 12.01 u[IU]/mL — AB (ref 0.450–4.500)

## 2018-10-16 NOTE — Telephone Encounter (Signed)
I informed the patient that her TSH was 12. I sent a message to Dr. Rex Kras as well. Patient will call Dr. Eddie Dibbles office to adjust her thyroid medication.

## 2018-10-17 ENCOUNTER — Encounter: Payer: Self-pay | Admitting: Hematology and Oncology

## 2018-10-17 DIAGNOSIS — R310 Gross hematuria: Secondary | ICD-10-CM | POA: Diagnosis not present

## 2018-10-17 DIAGNOSIS — R946 Abnormal results of thyroid function studies: Secondary | ICD-10-CM | POA: Diagnosis not present

## 2018-10-17 DIAGNOSIS — Z87442 Personal history of urinary calculi: Secondary | ICD-10-CM | POA: Diagnosis not present

## 2018-10-21 ENCOUNTER — Encounter (INDEPENDENT_AMBULATORY_CARE_PROVIDER_SITE_OTHER): Payer: Self-pay | Admitting: Orthopaedic Surgery

## 2018-10-24 DIAGNOSIS — R262 Difficulty in walking, not elsewhere classified: Secondary | ICD-10-CM | POA: Diagnosis not present

## 2018-10-24 DIAGNOSIS — Z96651 Presence of right artificial knee joint: Secondary | ICD-10-CM | POA: Diagnosis not present

## 2018-10-24 DIAGNOSIS — M25661 Stiffness of right knee, not elsewhere classified: Secondary | ICD-10-CM | POA: Diagnosis not present

## 2018-10-25 DIAGNOSIS — Z96651 Presence of right artificial knee joint: Secondary | ICD-10-CM | POA: Diagnosis not present

## 2018-10-25 DIAGNOSIS — M25661 Stiffness of right knee, not elsewhere classified: Secondary | ICD-10-CM | POA: Diagnosis not present

## 2018-10-25 DIAGNOSIS — R262 Difficulty in walking, not elsewhere classified: Secondary | ICD-10-CM | POA: Diagnosis not present

## 2018-10-26 DIAGNOSIS — Z96651 Presence of right artificial knee joint: Secondary | ICD-10-CM | POA: Diagnosis not present

## 2018-10-26 DIAGNOSIS — M25661 Stiffness of right knee, not elsewhere classified: Secondary | ICD-10-CM | POA: Diagnosis not present

## 2018-10-26 DIAGNOSIS — R262 Difficulty in walking, not elsewhere classified: Secondary | ICD-10-CM | POA: Diagnosis not present

## 2018-10-29 ENCOUNTER — Ambulatory Visit (INDEPENDENT_AMBULATORY_CARE_PROVIDER_SITE_OTHER): Payer: Medicare Other | Admitting: Orthopaedic Surgery

## 2018-10-29 ENCOUNTER — Encounter (INDEPENDENT_AMBULATORY_CARE_PROVIDER_SITE_OTHER): Payer: Self-pay | Admitting: Orthopaedic Surgery

## 2018-10-29 VITALS — BP 116/94 | HR 90 | Ht 65.5 in | Wt 125.0 lb

## 2018-10-29 DIAGNOSIS — Z96651 Presence of right artificial knee joint: Secondary | ICD-10-CM

## 2018-10-29 DIAGNOSIS — M25661 Stiffness of right knee, not elsewhere classified: Secondary | ICD-10-CM | POA: Diagnosis not present

## 2018-10-29 DIAGNOSIS — R262 Difficulty in walking, not elsewhere classified: Secondary | ICD-10-CM | POA: Diagnosis not present

## 2018-10-29 NOTE — Progress Notes (Signed)
Office Visit Note   Patient: [name]           Date of Birth: 1946/03/15           MRN: [id number] Visit Date: 10/29/2018              Requested by: Michelle Fess, MD [place], [place] [address] PCP: Michelle Fess, MD   Assessment & Plan: Visit Diagnoses:  1. History of total right knee replacement     Plan: 2 months status post primary right total knee replacement doing very well.  Very happy with the results limp plan to see her back in 3 months and urged her to continue with her exercises Follow-Up Instructions: Return in about 3 months (around 01/28/2019).   Orders:  No orders of the defined types were placed in this encounter.  No orders of the defined types were placed in this encounter.     Procedures: No procedures performed   Clinical Data: No additional findings.   Subjective: Chief Complaint  Patient presents with  . Right Knee - Follow-up  . Follow-up    Right total knee post op follow up, doing well. Still painful when going down stairs. No swelling. Incisions seems to be healing. Therapy going well. Discuss went back to hospital on 10/03/18 post surgery with pulmonary embolism.  [name] is 2 months status post primary right total knee replacement and doing well from that standpoint.  However, she did develop pulmonary emboli presumably on the basis of her tamoxifen.  The PE was performed 5 weeks after surgery.  She was hospitalized and is now on Xarelto for the next 6 months.  She is stopping the tamoxifen and will start another medicine per her oncologist.  HPI  Review of Systems   Objective: Vital Signs: BP (!) 116/94 (BP Location: Left Arm, Patient Position: Sitting, Cuff Size: Normal)   Pulse 90   Ht 5' 5.5" (1.664 m)   Wt 125 lb (56.7 kg)   BMI 20.48 kg/m   Physical Exam  Constitutional: She is oriented to person, place, and time. She appears well-developed and well-nourished.  HENT:  Mouth/Throat: Oropharynx is  clear and moist.  Eyes: Pupils are equal, round, and reactive to light. EOM are normal.  Pulmonary/Chest: Effort normal.  Neurological: She is alert and oriented to person, place, and time.  Skin: Skin is warm and dry.  Psychiatric: She has a normal mood and affect. Her behavior is normal.    Ortho Exam awake alert and oriented x3.  Comfortable sitting.  Full quick right knee extension and flexion over 105 degrees.  No instability.  No effusion.  Incision healed nicely.  No localized areas of tenderness.  No calf pain or distal edema.  Specialty Comments:  No specialty comments available.  Imaging: No results found.   PMFS History: Patient Active Problem List   Diagnosis Date Noted  . Hypothyroidism 10/03/2018  . PE (pulmonary thromboembolism) ([facility]) 10/03/2018  . Chronic diastolic CHF (congestive heart failure) ([facility]) 10/03/2018  . Macrocytic anemia 10/03/2018  . Unilateral primary osteoarthritis, right knee 08/28/2018  . History of total right knee replacement 08/28/2018  . Bilateral primary osteoarthritis of knee 03/12/2018  . Essential hypertension 08/26/2016  . Mitral valve disorder 08/26/2016  . History of palpitations 08/26/2016  . Dyslipidemia 08/26/2016  . Internal hemorrhoids 08/03/2015  . Rectal bleeding 08/03/2015  . Hx of radiation therapy   . Breast cancer of upper-outer quadrant of right female breast (  [facility]) 05/23/2013  . Personal history of colonic polyps 01/06/2012  . Irritable bowel syndrome - constipation predominant 01/06/2012   Past Medical History:  Diagnosis Date  . Adenomatous colon polyp 03/07/2012  . Allergic rhinitis   . Arthritis    "knees" (08/28/2018)  . Breast cancer, right breast ([facility]) 05/20/13   "no chemo" (08/28/2018)  . Chronic lower back pain   . Diverticulosis   . Graves' disease    2011  . Graves' disease   . History of kidney stones   . HLD (hyperlipidemia)   . Hx of radiation therapy 08/13/13- 09/06/13   right breast 4250 cGy 17  sessions  . Hypertension   . IBS (irritable bowel syndrome)   . Lumbar herniated disc    L4-L5  . Osteopenia   . Personal history of radiation therapy   . PONV (postoperative nausea and vomiting)    "when I have general anesthetic" (08/28/2018)  . PVC (premature ventricular contraction)    Takes metoprolol PRN for  pvc's    Family History  Problem Relation Age of Onset  . Heart disease Mother 51       MI  . Lung cancer Mother   . Hypertension Mother   . Alcohol abuse Father   . COPD Father   . Colon cancer Maternal Grandfather 48  . Diabetes Paternal Grandmother   . Breast cancer Maternal Aunt   . Pancreatic cancer Maternal Aunt   . Stomach cancer Neg Hx     Past Surgical History:  Procedure Laterality Date  . BREAST BIOPSY Right 2014  . BREAST LUMPECTOMY WITH NEEDLE LOCALIZATION AND AXILLARY SENTINEL LYMPH NODE BX Right 06/11/2013   Procedure: BREAST LUMPECTOMY WITH NEEDLE LOCALIZATION AND AXILLARY SENTINEL LYMPH NODE BX;  Surgeon: Harl Bowie, MD;  Location: [facility];  Service: General;  Laterality: Right;  Needle localization BCG 7:30  . COLONOSCOPY W/ BIOPSIES AND POLYPECTOMY  multiple  . JOINT REPLACEMENT    . KNEE ARTHROSCOPY Left 2006  . TOTAL KNEE ARTHROPLASTY Right 08/28/2018  . TOTAL KNEE ARTHROPLASTY Right 08/28/2018   Procedure: RIGHT TOTAL KNEE ARTHROPLASTY;  Surgeon: Garald Balding, MD;  Location: [facility];  Service: Orthopedics;  Laterality: Right;   Social History   Occupational History  . Occupation: Programmer, multimedia: UNEMPLOYED  Tobacco Use  . Smoking status: Never Smoker  . Smokeless tobacco: Never Used  Substance and Sexual Activity  . Alcohol use: Yes    Alcohol/week: 0.0 standard drinks    Comment: 08/28/2018 "1-2 drinks/month"  . Drug use: Never  . Sexual activity: Not Currently    Comment: menarche 66, 52 1 age 23, menopause 48, HRT x 9 yrs     Garald Balding, MD   Note - This record has been created using [employer].  Chart  creation errors have been sought, but may not always  have been located. Such creation errors do not reflect on  the standard of medical care.

## 2018-10-31 DIAGNOSIS — Z96651 Presence of right artificial knee joint: Secondary | ICD-10-CM | POA: Diagnosis not present

## 2018-10-31 DIAGNOSIS — R262 Difficulty in walking, not elsewhere classified: Secondary | ICD-10-CM | POA: Diagnosis not present

## 2018-10-31 DIAGNOSIS — M25661 Stiffness of right knee, not elsewhere classified: Secondary | ICD-10-CM | POA: Diagnosis not present

## 2018-11-05 DIAGNOSIS — Z96651 Presence of right artificial knee joint: Secondary | ICD-10-CM | POA: Diagnosis not present

## 2018-11-05 DIAGNOSIS — R262 Difficulty in walking, not elsewhere classified: Secondary | ICD-10-CM | POA: Diagnosis not present

## 2018-11-05 DIAGNOSIS — M25661 Stiffness of right knee, not elsewhere classified: Secondary | ICD-10-CM | POA: Diagnosis not present

## 2018-11-07 DIAGNOSIS — R262 Difficulty in walking, not elsewhere classified: Secondary | ICD-10-CM | POA: Diagnosis not present

## 2018-11-07 DIAGNOSIS — M25661 Stiffness of right knee, not elsewhere classified: Secondary | ICD-10-CM | POA: Diagnosis not present

## 2018-11-07 DIAGNOSIS — Z96651 Presence of right artificial knee joint: Secondary | ICD-10-CM | POA: Diagnosis not present

## 2018-11-12 DIAGNOSIS — R946 Abnormal results of thyroid function studies: Secondary | ICD-10-CM | POA: Diagnosis not present

## 2018-11-12 DIAGNOSIS — Z87442 Personal history of urinary calculi: Secondary | ICD-10-CM | POA: Diagnosis not present

## 2018-11-12 DIAGNOSIS — E039 Hypothyroidism, unspecified: Secondary | ICD-10-CM | POA: Diagnosis not present

## 2018-11-12 DIAGNOSIS — R310 Gross hematuria: Secondary | ICD-10-CM | POA: Diagnosis not present

## 2018-11-12 DIAGNOSIS — R8290 Unspecified abnormal findings in urine: Secondary | ICD-10-CM | POA: Diagnosis not present

## 2018-11-12 DIAGNOSIS — Z6820 Body mass index (BMI) 20.0-20.9, adult: Secondary | ICD-10-CM | POA: Diagnosis not present

## 2018-11-12 DIAGNOSIS — D500 Iron deficiency anemia secondary to blood loss (chronic): Secondary | ICD-10-CM | POA: Diagnosis not present

## 2018-11-12 DIAGNOSIS — Z86711 Personal history of pulmonary embolism: Secondary | ICD-10-CM | POA: Diagnosis not present

## 2018-11-17 ENCOUNTER — Encounter: Payer: Self-pay | Admitting: Hematology and Oncology

## 2018-11-20 ENCOUNTER — Telehealth: Payer: Self-pay | Admitting: Hematology and Oncology

## 2018-11-20 NOTE — Telephone Encounter (Signed)
Returned call re rescheduling 2/25 f/u. Gave patient new appointment for 3/3.

## 2018-11-28 DIAGNOSIS — R310 Gross hematuria: Secondary | ICD-10-CM | POA: Diagnosis not present

## 2018-11-28 DIAGNOSIS — N200 Calculus of kidney: Secondary | ICD-10-CM | POA: Diagnosis not present

## 2018-12-06 DIAGNOSIS — N200 Calculus of kidney: Secondary | ICD-10-CM | POA: Diagnosis not present

## 2018-12-06 DIAGNOSIS — K5790 Diverticulosis of intestine, part unspecified, without perforation or abscess without bleeding: Secondary | ICD-10-CM | POA: Diagnosis not present

## 2018-12-06 DIAGNOSIS — N1330 Unspecified hydronephrosis: Secondary | ICD-10-CM | POA: Diagnosis not present

## 2018-12-06 DIAGNOSIS — R310 Gross hematuria: Secondary | ICD-10-CM | POA: Diagnosis not present

## 2019-01-15 ENCOUNTER — Ambulatory Visit: Payer: Medicare Other | Admitting: Hematology and Oncology

## 2019-01-17 NOTE — Progress Notes (Signed)
Patient Care Team: Hulan Fess, MD as PCP - General (Family Medicine)  DIAGNOSIS:    ICD-10-CM   1. Malignant neoplasm of upper-outer quadrant of right breast in female, estrogen receptor positive ([facility]) C50.411    Z17.0     SUMMARY OF ONCOLOGIC HISTORY:   Breast cancer of upper-outer quadrant of right female breast ([facility])   04/23/2013 Initial Diagnosis    Right breast calcifications stereotactic biopsy revealed invasive lobular cancer with LCIS, lymph node benign, ER positive PR negative Ki-67 10%    06/11/2013 Surgery    Right breast lumpectomy: Lobular carcinoma in situ, no invasive disease found, sentinel nodes negative    08/06/2013 - 09/04/2013 Radiation Therapy    Adjuvant radiation therapy    10/03/2013 -  Anti-estrogen oral therapy    Tamoxifen 20 mg daily, switched to letrozole 10/15/18 due to PE     CHIEF COMPLIANT: Follow-up of letrozole  INTERVAL HISTORY: [name] is a 73 y.o. with above-mentioned history of right breast cancer who was on tamoxifen, but due to a PE following knee replacement surgery on 10/03/18, she was switched to letrozole. She presents to the clinic alone today. She notes hematuria and has large bruises on her arms resulting from taking Xarelto, which she will finish in 03/2019. She denies any side effects of letrozole, no worsening of existing joint stiffness and muscle aches. In 09/2018 she had elevated TSH and had lost a significant amount of weight and was fatigued, and she notes those symptoms have improved since gaining back some weight and eating more. She reviewed her medication list with me.   REVIEW OF SYSTEMS:   Constitutional: Denies fevers, chills or abnormal weight loss Eyes: Denies blurriness of vision Ears, nose, mouth, throat, and face: Denies mucositis or sore throat Respiratory: Denies cough, dyspnea or wheezes Cardiovascular: Denies palpitation, chest discomfort Gastrointestinal: Denies nausea, heartburn or change in  bowel habits Skin: (+) bruising on arms MSK: (+) myalgias (+) joint stiffness GYN: (+) hematuria Lymphatics: Denies new lymphadenopathy or easy bruising Neurological: Denies numbness, tingling or new weaknesses Behavioral/Psych: Mood is stable, no new changes  Extremities: No lower extremity edema Breast: denies any pain or lumps or nodules in either breasts All other systems were reviewed with the patient and are negative.  I have reviewed the past medical history, past surgical history, social history and family history with the patient and they are unchanged from previous note.  ALLERGIES:  is allergic to dilaudid [hydromorphone hcl]; terak [terramycin]; and amoxicillin.  MEDICATIONS:  Current Outpatient Medications  Medication Sig Dispense Refill  . acetaminophen (TYLENOL) 325 MG tablet Take 2 tablets (650 mg total) by mouth every 6 (six) hours.    Marland Kitchen atorvastatin (LIPITOR) 10 MG tablet Take 10 mg by mouth every evening.     . Bacillus Coagulans-Inulin (ALIGN PREBIOTIC-PROBIOTIC PO) Take 1 tablet by mouth daily.    Marland Kitchen buPROPion (WELLBUTRIN XL) 300 MG 24 hr tablet Take 300 mg by mouth daily.    . calcium-vitamin D (OSCAL WITH D) 500-200 MG-UNIT tablet Take 1 tablet by mouth daily with breakfast.    . Echinacea 350 MG CAPS Take 350 capsules by mouth 2 (two) times daily.    . fluticasone (FLONASE) 50 MCG/ACT nasal spray Place 2 sprays into the nose daily.     Marland Kitchen guaiFENesin (MUCINEX) 600 MG 12 hr tablet Take 1 tablet (600 mg total) by mouth 2 (two) times daily as needed.    Marland Kitchen letrozole (FEMARA) 2.5 MG tablet Take  1 tablet (2.5 mg total) by mouth daily. 90 tablet 3  . lisinopril (PRINIVIL,ZESTRIL) 10 MG tablet TAKE 1 TABLET BY MOUTH DAILY. 30 tablet 6  . methocarbamol (ROBAXIN) 500 MG tablet Take 1 tablet (500 mg total) by mouth every 8 (eight) hours as needed for muscle spasms. 30 tablet 0  . metoprolol tartrate (LOPRESSOR) 25 MG tablet Take 25 mg 1 hour before Cardiac CT (Patient taking  differently: Take 25 mg by mouth as needed (for PVC). ) 1 tablet 0  . Multiple Vitamin (MULTIVITAMIN) tablet Take 1 tablet by mouth daily.    . rivaroxaban (XARELTO) 20 MG TABS tablet Take 1 tablet (20 mg total) by mouth daily with supper. 30 tablet 4  . SYNTHROID 100 MCG tablet Take 100 mcg by mouth every other day.     Marland Kitchen SYNTHROID 88 MCG tablet Take 88 mcg by mouth every other day.      No current facility-administered medications for this visit.     PHYSICAL EXAMINATION: ECOG PERFORMANCE STATUS: 1 - Symptomatic but completely ambulatory  Vitals:   01/22/19 1349  BP: 119/66  Pulse: 73  Resp: 17  Temp: 98.2 F (36.8 C)  SpO2: 100%   Filed Weights   01/22/19 1349  Weight: 129 lb 4.8 oz (58.7 kg)    GENERAL: alert, no distress and comfortable SKIN: skin color, texture, turgor are normal, no rashes or significant lesions EYES: normal, Conjunctiva are pink and non-injected, sclera clear OROPHARYNX: no exudate, no erythema and lips, buccal mucosa, and tongue normal  NECK: supple, thyroid normal size, non-tender, without nodularity LYMPH: no palpable lymphadenopathy in the cervical, axillary or inguinal LUNGS: clear to auscultation and percussion with normal breathing effort HEART: regular rate & rhythm and no murmurs and no lower extremity edema ABDOMEN: abdomen soft, non-tender and normal bowel sounds MUSCULOSKELETAL: no cyanosis of digits and no clubbing  NEURO: alert & oriented x 3 with fluent speech, no focal motor/sensory deficits EXTREMITIES: No lower extremity edema  LABORATORY DATA:  I have reviewed the data as listed CMP Latest Ref Rng & Units 10/03/2018 08/29/2018 08/17/2018  Glucose 70 - 99 mg/dL 98 73 75  BUN 8 - 23 mg/dL _0 Creatinine 0.44 - 1.00 mg/dL 0.83 0.51 0.84  Sodium 135 - 145 mmol/L 133(L) 136 139  Potassium 3.5 - 5.1 mmol/L 3.9 4.6 4.0  Chloride 98 - 111 mmol/L 100 108 108  CO2 22 - 32 mmol/L 25 19(L) 23  Calcium 8.9 - 10.3 mg/dL 8.8(L) 7.8(L)  9.3  Total Protein 6.5 - 8.1 g/dL - - 6.3(L)  Total Bilirubin 0.3 - 1.2 mg/dL - - 0.7  Alkaline Phos 38 - 126 U/L - - 42  AST 15 - 41 U/L - - 30  ALT 0 - 44 U/L - - 20    Lab Results  Component Value Date   WBC 9.6 10/04/2018   HGB 10.7 (L) 10/04/2018   HCT 33.2 (L) 10/04/2018   MCV 105.1 (H) 10/04/2018   PLT 261 10/04/2018   NEUTROABS 11.0 (H) 10/03/2018    ASSESSMENT & PLAN:  Breast cancer of upper-outer quadrant of right female breast Right breast invasive lobular cancer with LCIS T1 N0 M0 stage IA status post lumpectomy which did not show any further evidence of invasive breast cancer. ER positive PR positive HER-2 negative, received adjuvant radiation completed 09/04/2013, started her tamoxifen 20 mg daily from November 2013-10/15/2018, switched to letrozole  Letrozole toxicities:  Denies any hot flashes or significant increase  in arthralgias or myalgias. Continue letrozole for 5 years  Breast cancer surveillance: 1. Breast exam 11/25/2019does not reveal any palpable lumps or nodules of concern. 2. Mammogram10/08/2018is normal, breast density categoryB, new mammogram will need to be obtained 3. Breast MRI 06/25/2018: Negative, plan to do MRIs every other year  CT chest 10/03/2018: Small volume pulmonary emboli noted right lower lobe, small right pleural effusion and airspace opacity likely due to pulmonary infarction Risk factors: Knee replacement surgery and being on tamoxifen Current treatment: Xarelto for 6 months started November 2019, will be completed May 2020 Side effects of Xarelto: Easy bruising on her arms  Return to clinic in 1 year for follow-up    No orders of the defined types were placed in this encounter.  The patient has a good understanding of the overall plan. she agrees with it. she will call with any problems that may develop before the next visit here.  Nicholas Lose, MD 01/22/2019  Julious Oka Dorshimer am acting as scribe for Dr. Nicholas Lose.  I have reviewed the above documentation for accuracy and completeness, and I agree with the above.

## 2019-01-22 ENCOUNTER — Telehealth: Payer: Self-pay | Admitting: Hematology and Oncology

## 2019-01-22 ENCOUNTER — Inpatient Hospital Stay: Payer: Medicare Other | Attending: Hematology and Oncology | Admitting: Hematology and Oncology

## 2019-01-22 DIAGNOSIS — Z170 Estrogen receptor positive status [ER+]: Secondary | ICD-10-CM | POA: Diagnosis not present

## 2019-01-22 DIAGNOSIS — J90 Pleural effusion, not elsewhere classified: Secondary | ICD-10-CM | POA: Insufficient documentation

## 2019-01-22 DIAGNOSIS — I2699 Other pulmonary embolism without acute cor pulmonale: Secondary | ICD-10-CM | POA: Insufficient documentation

## 2019-01-22 DIAGNOSIS — C50411 Malignant neoplasm of upper-outer quadrant of right female breast: Secondary | ICD-10-CM | POA: Insufficient documentation

## 2019-01-22 DIAGNOSIS — R233 Spontaneous ecchymoses: Secondary | ICD-10-CM | POA: Diagnosis not present

## 2019-01-22 DIAGNOSIS — Z96659 Presence of unspecified artificial knee joint: Secondary | ICD-10-CM | POA: Insufficient documentation

## 2019-01-22 MED ORDER — GUAIFENESIN ER 600 MG PO TB12: 600.0000 mg | ORAL_TABLET | Freq: Two times a day (BID) | ORAL | Status: DC | PRN

## 2019-01-22 MED ORDER — CALCIUM CARBONATE-VITAMIN D 500-200 MG-UNIT PO TABS: 1.0000 | ORAL_TABLET | Freq: Every day | ORAL | Status: DC

## 2019-01-22 NOTE — Telephone Encounter (Signed)
Gave avs  ° °

## 2019-01-22 NOTE — Assessment & Plan Note (Signed)
Right breast invasive lobular cancer with LCIS T1 N0 M0 stage IA status post lumpectomy which did not show any further evidence of invasive breast cancer. ER positive PR positive HER-2 negative, received adjuvant radiation completed 09/04/2013, started her tamoxifen 20 mg daily from November 2013-10/15/2018, switched to letrozole  Letrozole toxicities:    Breast cancer surveillance: 1. Breast exam 11/25/2019does not reveal any palpable lumps or nodules of concern. 2. Mammogram10/08/2018is normal, breast density categoryB, new mammogram will need to be obtained 3. Breast MRI 06/25/2018: Negative, plan to do MRIs every other year  CT chest 10/03/2018: Small volume pulmonary emboli noted right lower lobe, small right pleural effusion and airspace opacity likely due to pulmonary infarction Risk factors: Knee replacement surgery and being on tamoxifen Current treatment: Xarelto for 6 months started November 2019  Return to clinic in 1 year for follow-up

## 2019-01-23 ENCOUNTER — Ambulatory Visit (INDEPENDENT_AMBULATORY_CARE_PROVIDER_SITE_OTHER): Payer: Medicare Other | Admitting: Orthopaedic Surgery

## 2019-01-23 ENCOUNTER — Encounter (INDEPENDENT_AMBULATORY_CARE_PROVIDER_SITE_OTHER): Payer: Self-pay | Admitting: Orthopaedic Surgery

## 2019-01-23 VITALS — BP 102/58 | HR 52 | Ht 65.5 in | Wt 128.0 lb

## 2019-01-23 DIAGNOSIS — Z96651 Presence of right artificial knee joint: Secondary | ICD-10-CM | POA: Diagnosis not present

## 2019-01-23 NOTE — Progress Notes (Signed)
Office Visit Note   Patient: [name]           Date of Birth: 16-Jun-1946           MRN: [id number] Visit Date: 01/23/2019              Requested by: Hulan Fess, MD [place], [place] [address] PCP: Hulan Fess, MD   Assessment & Plan: Visit Diagnoses:  1. History of total right knee replacement     Plan: 4 months status post primary right total knee replacement doing very well.  No pain.  No limitations.  Will urged her to continue with her exercises and return in 6 months  Follow-Up Instructions: Return in about 6 months (around 07/26/2019).   Orders:  No orders of the defined types were placed in this encounter.  No orders of the defined types were placed in this encounter.     Procedures: No procedures performed   Clinical Data: No additional findings.   Subjective: Chief Complaint  Patient presents with  . Right Knee - Follow-up    Right TKA 08/28/18  Patient presents today for follow up on her right knee. She is now 5 months status post right total knee arthroplasty. DOS 08/28/18. Doing well. No complaints.  Did develop pulmonary emboli about a month postop and is being treated with Xarelto for total of 6 months.  No problems with shortness of breath or chest pain  HPI  Review of Systems   Objective: Vital Signs: BP (!) 102/58   Pulse (!) 52   Ht 5' 5.5" (1.664 m)   Wt 128 lb (58.1 kg)   BMI 20.98 kg/m   Physical Exam  Ortho Exam awake alert and oriented x3.  Comfortable sitting.  Walks without a limp.  Full knee extension and flexion over 105 degrees without any instability.  No effusion.  Incision healing without a problem.  No distal edema.  Neurologically intact  Specialty Comments:  No specialty comments available.  Imaging: No results found.   PMFS History: Patient Active Problem List   Diagnosis Date Noted  . Hypothyroidism 10/03/2018  . PE (pulmonary thromboembolism) ([facility]) 10/03/2018  . Chronic diastolic CHF  (congestive heart failure) ([facility]) 10/03/2018  . Macrocytic anemia 10/03/2018  . Unilateral primary osteoarthritis, right knee 08/28/2018  . History of total right knee replacement 08/28/2018  . Bilateral primary osteoarthritis of knee 03/12/2018  . Essential hypertension 08/26/2016  . Mitral valve disorder 08/26/2016  . History of palpitations 08/26/2016  . Dyslipidemia 08/26/2016  . Internal hemorrhoids 08/03/2015  . Rectal bleeding 08/03/2015  . Hx of radiation therapy   . Breast cancer of upper-outer quadrant of right female breast ([facility]) 05/23/2013  . Personal history of colonic polyps 01/06/2012  . Irritable bowel syndrome - constipation predominant 01/06/2012   Past Medical History:  Diagnosis Date  . Adenomatous colon polyp 03/07/2012  . Allergic rhinitis   . Arthritis    "knees" (08/28/2018)  . Breast cancer, right breast ([facility]) 05/20/13   "no chemo" (08/28/2018)  . Chronic lower back pain   . Diverticulosis   . Graves' disease    2011  . Graves' disease   . History of kidney stones   . HLD (hyperlipidemia)   . Hx of radiation therapy 08/13/13- 09/06/13   right breast 4250 cGy 17 sessions  . Hypertension   . IBS (irritable bowel syndrome)   . Lumbar herniated disc    L4-L5  . Osteopenia   .  Personal history of radiation therapy   . PONV (postoperative nausea and vomiting)    "when I have general anesthetic" (08/28/2018)  . PVC (premature ventricular contraction)    Takes metoprolol PRN for  pvc's    Family History  Problem Relation Age of Onset  . Heart disease Mother 51       MI  . Lung cancer Mother   . Hypertension Mother   . Alcohol abuse Father   . COPD Father   . Colon cancer Maternal Grandfather 13  . Diabetes Paternal Grandmother   . Breast cancer Maternal Aunt   . Pancreatic cancer Maternal Aunt   . Stomach cancer Neg Hx     Past Surgical History:  Procedure Laterality Date  . BREAST BIOPSY Right 2014  . BREAST LUMPECTOMY WITH NEEDLE LOCALIZATION  AND AXILLARY SENTINEL LYMPH NODE BX Right 06/11/2013   Procedure: BREAST LUMPECTOMY WITH NEEDLE LOCALIZATION AND AXILLARY SENTINEL LYMPH NODE BX;  Surgeon: Harl Bowie, MD;  Location: [facility];  Service: General;  Laterality: Right;  Needle localization BCG 7:30  . COLONOSCOPY W/ BIOPSIES AND POLYPECTOMY  multiple  . JOINT REPLACEMENT    . KNEE ARTHROSCOPY Left 2006  . TOTAL KNEE ARTHROPLASTY Right 08/28/2018  . TOTAL KNEE ARTHROPLASTY Right 08/28/2018   Procedure: RIGHT TOTAL KNEE ARTHROPLASTY;  Surgeon: Garald Balding, MD;  Location: [facility];  Service: Orthopedics;  Laterality: Right;   Social History   Occupational History  . Occupation: Programmer, multimedia: UNEMPLOYED  Tobacco Use  . Smoking status: Never Smoker  . Smokeless tobacco: Never Used  Substance and Sexual Activity  . Alcohol use: Yes    Alcohol/week: 0.0 standard drinks    Comment: 08/28/2018 "1-2 drinks/month"  . Drug use: Never  . Sexual activity: Not Currently    Comment: menarche 70, 68 1 age 80, menopause 25, HRT x 9 yrs

## 2019-01-30 ENCOUNTER — Ambulatory Visit (INDEPENDENT_AMBULATORY_CARE_PROVIDER_SITE_OTHER): Payer: Medicare Other | Admitting: Orthopaedic Surgery

## 2019-03-18 DIAGNOSIS — N200 Calculus of kidney: Secondary | ICD-10-CM | POA: Diagnosis not present

## 2019-03-18 DIAGNOSIS — R310 Gross hematuria: Secondary | ICD-10-CM | POA: Diagnosis not present

## 2019-03-20 ENCOUNTER — Other Ambulatory Visit (HOSPITAL_COMMUNITY): Payer: Self-pay | Admitting: Urology

## 2019-03-20 ENCOUNTER — Other Ambulatory Visit: Payer: Self-pay | Admitting: Urology

## 2019-03-20 DIAGNOSIS — N200 Calculus of kidney: Secondary | ICD-10-CM

## 2019-03-21 DIAGNOSIS — R310 Gross hematuria: Secondary | ICD-10-CM | POA: Diagnosis not present

## 2019-03-21 DIAGNOSIS — E039 Hypothyroidism, unspecified: Secondary | ICD-10-CM | POA: Diagnosis not present

## 2019-03-21 DIAGNOSIS — Z79899 Other long term (current) drug therapy: Secondary | ICD-10-CM | POA: Diagnosis not present

## 2019-03-21 DIAGNOSIS — R7989 Other specified abnormal findings of blood chemistry: Secondary | ICD-10-CM | POA: Diagnosis not present

## 2019-03-21 DIAGNOSIS — R0602 Shortness of breath: Secondary | ICD-10-CM | POA: Diagnosis not present

## 2019-03-21 DIAGNOSIS — D500 Iron deficiency anemia secondary to blood loss (chronic): Secondary | ICD-10-CM | POA: Diagnosis not present

## 2019-03-21 DIAGNOSIS — Z87442 Personal history of urinary calculi: Secondary | ICD-10-CM | POA: Diagnosis not present

## 2019-03-21 DIAGNOSIS — E7800 Pure hypercholesterolemia, unspecified: Secondary | ICD-10-CM | POA: Diagnosis not present

## 2019-03-21 DIAGNOSIS — Z6820 Body mass index (BMI) 20.0-20.9, adult: Secondary | ICD-10-CM | POA: Diagnosis not present

## 2019-03-21 DIAGNOSIS — R8290 Unspecified abnormal findings in urine: Secondary | ICD-10-CM | POA: Diagnosis not present

## 2019-03-21 DIAGNOSIS — Z86711 Personal history of pulmonary embolism: Secondary | ICD-10-CM | POA: Diagnosis not present

## 2019-03-28 ENCOUNTER — Telehealth: Payer: Self-pay | Admitting: Cardiology

## 2019-03-28 NOTE — Telephone Encounter (Signed)
Agree. If she is having shortness of breath, she should report to the ER for CTA given history of DVT.

## 2019-03-28 NOTE — Telephone Encounter (Signed)
Pt states she presented to hospital in November which was about 5 weeks after her knee surgery and was Dx with PE. She received treatment and was discharged on Xarelto and is finishing up 6 month of taking med. She states she has noticed increased SOB and tends to get 'much more winded when exercising/walking'. No other symptoms.This has been the case over the past 2 weeks and she wants to know if a CT can be ordered. She also questioned Dx of HF during hospitalization even though echo showed normal range EF. Explained that HF Dx may be d/t grade 1DD but should consult with provider. Appt set for 5/8 at 10:20am. Informed pt that message would be routed to provider on-call and informed pt of symptoms of PE and advised to report to ED if they present. Pt verbalized understanding        Virtual Visit Pre-Appointment Phone Call  "(Name), I am calling you today to discuss your upcoming appointment. We are currently trying to limit exposure to the virus that causes COVID-19 by seeing patients at home rather than in the office."  1. "What is the BEST phone number to call the day of the visit?" - include this in appointment notes  2. "Do you have or have access to (through a family member/friend) a smartphone with video capability that we can use for your visit?" a. If yes - list this number in appt notes as "cell" (if different from BEST phone #) and list the appointment type as a VIDEO visit in appointment notes b. If no - list the appointment type as a PHONE visit in appointment notes  3. Confirm consent - "In the setting of the current Covid19 crisis, you are scheduled for a (phone or video) visit with your provider on (date) at (time).  Just as we do with many in-office visits, in order for you to participate in this visit, we must obtain consent.  If you'd like, I can send this to your mychart (if signed up) or email for you to review.  Otherwise, I can obtain your verbal consent now.  All virtual  visits are billed to your insurance company just like a normal visit would be.  By agreeing to a virtual visit, we'd like you to understand that the technology does not allow for your provider to perform an examination, and thus may limit your provider's ability to fully assess your condition. If your provider identifies any concerns that need to be evaluated in person, we will make arrangements to do so.  Finally, though the technology is pretty good, we cannot assure that it will always work on either your or our end, and in the setting of a video visit, we may have to convert it to a phone-only visit.  In either situation, we cannot ensure that we have a secure connection.  Are you willing to proceed?" STAFF: Did the patient verbally acknowledge consent to telehealth visit? Document YES/NO here: YES  4. Advise patient to be prepared - "Two hours prior to your appointment, go ahead and check your blood pressure, pulse, oxygen saturation, and your weight (if you have the equipment to check those) and write them all down. When your visit starts, your provider will ask you for this information. If you have an Apple Watch or Kardia device, please plan to have heart rate information ready on the day of your appointment. Please have a pen and paper handy nearby the day of the visit as well."  5.  Give patient instructions for MyChart download to smartphone OR Doximity/Doxy.me as below if video visit (depending on what platform provider is using)  6. Inform patient they will receive a phone call 15 minutes prior to their appointment time (may be from unknown caller ID) so they should be prepared to answer    TELEPHONE CALL NOTE  AALAYSIA LIGGINS has been deemed a candidate for a follow-up tele-health visit to limit community exposure during the Covid-19 pandemic. I spoke with the patient via phone to ensure availability of phone/video source, confirm preferred email & phone number, and discuss instructions and  expectations.  I reminded AISSA LISOWSKI to be prepared with any vital sign and/or heart rhythm information that could potentially be obtained via home monitoring, at the time of her visit. I reminded JAMIE HAFFORD to expect a phone call prior to her visit.  Annita Brod, RN 03/28/2019 3:30 PM   INSTRUCTIONS FOR DOWNLOADING THE MYCHART APP TO SMARTPHONE  - The patient must first make sure to have activated MyChart and know their login information - If Apple, go to [employer] and type in MyChart in the search bar and download the app. If Android, ask patient to go to [employer] and type in [place] in the search bar and download the app. The app is free but as with any other app downloads, their phone may require them to verify saved payment information or Apple/Android password.  - The patient will need to then log into the app with their MyChart username and password, and select  as their healthcare provider to link the account. When it is time for your visit, go to the MyChart app, find appointments, and click Begin Video Visit. Be sure to Select Allow for your device to access the Microphone and Camera for your visit. You will then be connected, and your provider will be with you shortly.  **If they have any issues connecting, or need assistance please contact MyChart service desk [phone])**  **If using a computer, in order to ensure the best quality for their visit they will need to use either of the following Internet Browsers: [employer], or Google Chrome**  IF USING DOXIMITY or DOXY.ME - The patient will receive a link just prior to their visit by text.     FULL LENGTH CONSENT FOR TELE-HEALTH VISIT   I hereby voluntarily request, consent and authorize [facility] and its employed or contracted physicians, physician assistants, nurse practitioners or other licensed health care professionals (the Practitioner), to provide me with  telemedicine health care services (the "Services") as deemed necessary by the treating Practitioner. I acknowledge and consent to receive the Services by the Practitioner via telemedicine. I understand that the telemedicine visit will involve communicating with the Practitioner through live audiovisual communication technology and the disclosure of certain medical information by electronic transmission. I acknowledge that I have been given the opportunity to request an in-person assessment or other available alternative prior to the telemedicine visit and am voluntarily participating in the telemedicine visit.  I understand that I have the right to withhold or withdraw my consent to the use of telemedicine in the course of my care at any time, without affecting my right to future care or treatment, and that the Practitioner or I may terminate the telemedicine visit at any time. I understand that I have the right to inspect all information obtained and/or recorded in the course of the telemedicine visit and may receive  copies of available information for a reasonable fee.  I understand that some of the potential risks of receiving the Services via telemedicine include:  Marland Kitchen Delay or interruption in medical evaluation due to technological equipment failure or disruption; . Information transmitted may not be sufficient (e.g. poor resolution of images) to allow for appropriate medical decision making by the Practitioner; and/or  . In rare instances, security protocols could fail, causing a breach of personal health information.  Furthermore, I acknowledge that it is my responsibility to provide information about my medical history, conditions and care that is complete and accurate to the best of my ability. I acknowledge that Practitioner's advice, recommendations, and/or decision may be based on factors not within their control, such as incomplete or inaccurate data provided by me or distortions of diagnostic  images or specimens that may result from electronic transmissions. I understand that the practice of medicine is not an exact science and that Practitioner makes no warranties or guarantees regarding treatment outcomes. I acknowledge that I will receive a copy of this consent concurrently upon execution via email to the email address I last provided but may also request a printed copy by calling the office of [facility].    I understand that my insurance will be billed for this visit.   I have read or had this consent read to me. . I understand the contents of this consent, which adequately explains the benefits and risks of the Services being provided via telemedicine.  . I have been provided ample opportunity to ask questions regarding this consent and the Services and have had my questions answered to my satisfaction. . I give my informed consent for the services to be provided through the use of telemedicine in my medical care  By participating in this telemedicine visit I agree to the above.      Cardiac Questionnaire:    Since your last visit or hospitalization:    1. Have you been having new or worsening chest pain? NO   2. Have you been having new or worsening shortness of breath? YES 3. Have you been having new or worsening leg swelling, wt gain, or increase in abdominal girth (pants fitting more tightly)? NO   4. Have you had any passing out spells? NO    *A YES to any of these questions would result in the appointment being kept. *If all the answers to these questions are NO, we should indicate that given the current situation regarding the worldwide coronarvirus pandemic, at the recommendation of the CDC, we are looking to limit gatherings in our waiting area, and thus will reschedule their appointment beyond four weeks from today.   _____________   VOJJK-09 Pre-Screening Questions:  . Do you currently have a fever? NO (yes = cancel and refer to pcp for e-visit) . Have  you recently travelled on a cruise, internationally, or to [place], [place], [place], [place], [place], or [place], [place] [employer]) ? NO (yes = cancel, stay home, monitor symptoms, and contact pcp or initiate e-visit if symptoms develop) . Have you been in contact with someone that is currently pending confirmation of Covid19 testing or has been confirmed to have the [place] virus?  NO (yes = cancel, stay home, away from tested individual, monitor symptoms, and contact pcp or initiate e-visit if symptoms develop) . Are you currently experiencing fatigue or cough? NO (yes = pt should be prepared to have a mask placed at the time of their visit).

## 2019-03-28 NOTE — Telephone Encounter (Signed)
New message      Pt c/o Shortness Of Breath: STAT if SOB developed within the last 24 hours or pt is noticeably SOB on the phone  1. Are you currently SOB (can you hear that pt is SOB on the phone)? No   2. How long have you been experiencing SOB? 2 weeks   3. Are you SOB when sitting or when up moving around? Moving around up and down steps   4. Are you currently experiencing any other symptoms? No

## 2019-03-28 NOTE — Progress Notes (Signed)
Virtual Visit via Video Note   This visit type was conducted due to national recommendations for restrictions regarding the COVID-19 Pandemic (e.g. social distancing) in an effort to limit this patient's exposure and mitigate transmission in our community.  Due to her co-morbid illnesses, this patient is at least at moderate risk for complications without adequate follow up.  This format is felt to be most appropriate for this patient at this time.  All issues noted in this document were discussed and addressed.  A limited physical exam was performed with this format.  Please refer to the patient's chart for her consent to telehealth for [facility].   Date:  03/29/2019   ID:  [name], DOB [birth year], MRN [id number]  Patient Location: Home Provider Location: Home  PCP:  Hulan Fess, MD  Cardiologist:  Minus Breeding, MD  Electrophysiologist:  None   Evaluation Performed:  Follow-Up Visit  Chief Complaint:  SOB   History of Present Illness:    [name] is a 73 y.o. female who I saw previously for evaluation of palpitations. I saw her 13 years ago and she was having palpitations. She does have mitral valve prolapse. However, nothing significant was noted and she didn't have any problems until she developed Graves' disease 2011. She again had palpitations. She was living in [place] and she had a workup by a cardiologist there. Coronary CT demonstrated some diagonal stenosis but she doesn't know of any other disease. She had a treadmill test which apparently was negative.  I saw her last year.  She had a normal echo.    She had a knee replacement since we saw her with complications of a PE.  She is about to finish anticoagulation.  She has had increased fatigue and decreased exercise tolerance.  She gets SOB walking some inclines near her house.  The patient denies any new symptoms such as chest discomfort, neck or arm discomfort. There has been no new PND or orthopnea.  There have been no reported palpitations, presyncope or syncope.    She is concerned because of her strong family history.   The patient does not have symptoms concerning for COVID-19 infection (fever, chills, cough, or new shortness of breath).    Past Medical History:  Diagnosis Date  . Adenomatous colon polyp 03/07/2012  . Allergic rhinitis   . Arthritis    "knees" (08/28/2018)  . Breast cancer, right breast ([facility]) 05/20/13   "no chemo" (08/28/2018)  . Chronic lower back pain   . Diverticulosis   . Graves' disease    2011  . Graves' disease   . History of kidney stones   . HLD (hyperlipidemia)   . Hx of radiation therapy 08/13/13- 09/06/13   right breast 4250 cGy 17 sessions  . Hypertension   . IBS (irritable bowel syndrome)   . Lumbar herniated disc    L4-L5  . Osteopenia   . Personal history of radiation therapy   . PONV (postoperative nausea and vomiting)    "when I have general anesthetic" (08/28/2018)  . PVC (premature ventricular contraction)    Takes metoprolol PRN for  pvc's   Past Surgical History:  Procedure Laterality Date  . BREAST BIOPSY Right 2014  . BREAST LUMPECTOMY WITH NEEDLE LOCALIZATION AND AXILLARY SENTINEL LYMPH NODE BX Right 06/11/2013   Procedure: BREAST LUMPECTOMY WITH NEEDLE LOCALIZATION AND AXILLARY SENTINEL LYMPH NODE BX;  Surgeon: Harl Bowie, MD;  Location: [facility];  Service: General;  Laterality: Right;  Needle localization BCG 7:30  . COLONOSCOPY W/ BIOPSIES AND POLYPECTOMY  multiple  . JOINT REPLACEMENT    . KNEE ARTHROSCOPY Left 2006  . TOTAL KNEE ARTHROPLASTY Right 08/28/2018  . TOTAL KNEE ARTHROPLASTY Right 08/28/2018   Procedure: RIGHT TOTAL KNEE ARTHROPLASTY;  Surgeon: Garald Balding, MD;  Location: [facility];  Service: Orthopedics;  Laterality: Right;     Current Meds  Medication Sig  . acetaminophen (TYLENOL) 325 MG tablet Take 2 tablets (650 mg total) by mouth every 6 (six) hours.  Marland Kitchen atorvastatin (LIPITOR) 10 MG tablet Take 10  mg by mouth every evening.   Marland Kitchen buPROPion (WELLBUTRIN XL) 300 MG 24 hr tablet Take 300 mg by mouth daily.  . calcium-vitamin D (OSCAL WITH D) 500-200 MG-UNIT tablet Take 1 tablet by mouth daily with breakfast.  . Echinacea 350 MG CAPS Take 350 capsules by mouth 2 (two) times daily.  . fluticasone (FLONASE) 50 MCG/ACT nasal spray Place 2 sprays into the nose daily.   Marland Kitchen guaiFENesin (MUCINEX) 600 MG 12 hr tablet Take 1 tablet (600 mg total) by mouth 2 (two) times daily as needed.  Marland Kitchen letrozole (FEMARA) 2.5 MG tablet Take 1 tablet (2.5 mg total) by mouth daily.  Marland Kitchen lisinopril (PRINIVIL,ZESTRIL) 10 MG tablet TAKE 1 TABLET BY MOUTH DAILY.  . metoprolol tartrate (LOPRESSOR) 25 MG tablet Take 25 mg 1 hour before Cardiac CT (Patient taking differently: Take 25 mg by mouth as needed (for PVC). )  . Multiple Vitamin (MULTIVITAMIN) tablet Take 1 tablet by mouth daily.  . rivaroxaban (XARELTO) 20 MG TABS tablet Take 1 tablet (20 mg total) by mouth daily with supper.  Marland Kitchen SYNTHROID 88 MCG tablet Take 88 mcg by mouth daily before breakfast.   . zolpidem (AMBIEN) 10 MG tablet Take 1 tablet by mouth at bedtime as needed.     Allergies:   Dilaudid [hydromorphone hcl]; Terak [terramycin]; and Amoxicillin   Social History   Tobacco Use  . Smoking status: Never Smoker  . Smokeless tobacco: Never Used  Substance Use Topics  . Alcohol use: Yes    Alcohol/week: 0.0 standard drinks    Comment: 08/28/2018 "1-2 drinks/month"  . Drug use: Never     Family Hx: The patient's family history includes Alcohol abuse in her father; Breast cancer in her maternal aunt; COPD in her father; Colon cancer (age of onset: 63) in her maternal grandfather; Diabetes in her paternal grandmother; Heart disease (age of onset: 29) in her mother; Hypertension in her mother; Lung cancer in her mother; Pancreatic cancer in her maternal aunt. There is no history of Stomach cancer.  ROS:   Please see the history of present illness.    As  stated in the HPI and negative for all other systems.   Prior CV studies:   The following studies were reviewed today  Labs/Other Tests and Data Reviewed:    EKG:  No ECG reviewed.  Recent Labs: 08/17/2018: ALT 20 10/03/2018: B Natriuretic Peptide 22.4; BUN 12; Creatinine, Ser 0.83; Potassium 3.9; Sodium 133 10/04/2018: Hemoglobin 10.7; Platelets 261 10/15/2018: TSH 12.010   Recent Lipid Panel No results found for: CHOL, TRIG, HDL, CHOLHDL, LDLCALC, LDLDIRECT  Wt Readings from Last 3 Encounters:  01/23/19 128 lb (58.1 kg)  01/22/19 129 lb 4.8 oz (58.7 kg)  10/29/18 125 lb (56.7 kg)     Objective:    Vital Signs:  BP 134/73   Pulse 79    VITAL SIGNS:  reviewed GEN:  no acute distress  EYES:  sclerae anicteric, EOMI - Extraocular Movements Intact NEURO:  alert and oriented x 3, no obvious focal deficit PSYCH:  normal affect  ASSESSMENT & PLAN:    SOB I would like to screen her with a POET (Plain Old Exercise Treadmill).  I would like to do this before she has a kidney stone procedure in early July.  We will make this a priority one POET (Plain Old Exercise Treadmill).  I had a long discussion with her about the rationale of looking for high risk findings on treadmill or symptoms rather than imaging as a first step.  She has no unstable symptoms.  She does have risk factors.  I will screen as above.   Mitral valve disorder This was mild on echo last year. No further imaging.   History of palpitations Continue current therapy.  Dyslipidemia Continue current therapy.  Labs at [facility] recently with LDL of 99 and HDL of 93.   Time:   Today, I have spent 25  minutes with the patient with telehealth technology discussing the above problems.     Medication Adjustments/Labs and Tests Ordered: Current medicines are reviewed at length with the patient today.  Concerns regarding medicines are outlined above.   Tests Ordered: No orders of the defined types were placed in this  encounter.   Medication Changes: No orders of the defined types were placed in this encounter.   Disposition:  Follow up me on one year.  Signed, Minus Breeding, MD  03/29/2019 10:58 AM    [facility]

## 2019-03-28 NOTE — Telephone Encounter (Signed)
Spoke with pt and advised of Fabian Sharp, [place] recommendations. Pt verbalized understanding

## 2019-03-29 ENCOUNTER — Telehealth (INDEPENDENT_AMBULATORY_CARE_PROVIDER_SITE_OTHER): Payer: Medicare Other | Admitting: Cardiology

## 2019-03-29 ENCOUNTER — Encounter: Payer: Self-pay | Admitting: Cardiology

## 2019-03-29 VITALS — BP 134/73 | HR 79

## 2019-03-29 DIAGNOSIS — I059 Rheumatic mitral valve disease, unspecified: Secondary | ICD-10-CM

## 2019-03-29 DIAGNOSIS — Z7189 Other specified counseling: Secondary | ICD-10-CM

## 2019-03-29 DIAGNOSIS — E785 Hyperlipidemia, unspecified: Secondary | ICD-10-CM | POA: Diagnosis not present

## 2019-03-29 DIAGNOSIS — R0602 Shortness of breath: Secondary | ICD-10-CM | POA: Diagnosis not present

## 2019-03-29 NOTE — Patient Instructions (Signed)
Medication Instructions:  Continue current medications  If you need a refill on your cardiac medications before your next appointment, please call your pharmacy.  Labwork: None Ordered  Testing/Procedures: Your physician has requested that you have an exercise tolerance test in June. For further information please visit [email or web address]. Please also follow instruction sheet, as given.   Follow-Up: You will need a follow up appointment in 1 Year.  Please call our office 2 months in advance to schedule this appointment.  You may see Minus Breeding, MD or one of the following Advanced Practice Providers on your designated Care Team:   Rosaria Ferries, PA-C . Jory Sims, DNP, ANP      At [facility], you and your health needs are our priority.  As part of our continuing mission to provide you with exceptional heart care, we have created designated Provider Care Teams.  These Care Teams include your primary Cardiologist (physician) and Advanced Practice Providers (APPs -  Physician Assistants and Nurse Practitioners) who all work together to provide you with the care you need, when you need it.  Thank you for choosing [facility] at [facility]!!

## 2019-04-03 ENCOUNTER — Other Ambulatory Visit: Payer: Self-pay | Admitting: Urology

## 2019-04-30 DIAGNOSIS — Z20828 Contact with and (suspected) exposure to other viral communicable diseases: Secondary | ICD-10-CM | POA: Diagnosis not present

## 2019-05-02 DIAGNOSIS — Z20828 Contact with and (suspected) exposure to other viral communicable diseases: Secondary | ICD-10-CM | POA: Diagnosis not present

## 2019-05-14 ENCOUNTER — Other Ambulatory Visit: Payer: Self-pay | Admitting: Cardiology

## 2019-05-20 DIAGNOSIS — R1084 Generalized abdominal pain: Secondary | ICD-10-CM | POA: Diagnosis not present

## 2019-05-20 DIAGNOSIS — N200 Calculus of kidney: Secondary | ICD-10-CM | POA: Diagnosis not present

## 2019-05-20 DIAGNOSIS — R310 Gross hematuria: Secondary | ICD-10-CM | POA: Diagnosis not present

## 2019-05-21 ENCOUNTER — Telehealth: Payer: Self-pay | Admitting: Cardiology

## 2019-05-21 NOTE — Telephone Encounter (Signed)
New message:     Patient calling concering getting a appt. Please call patient  Back.

## 2019-05-22 NOTE — Telephone Encounter (Signed)
So we are getting closer to starting these.  Dr. Meda Coffee is working on the PPE protocol and should have that completed in the next day or so.  So I guess hold off on scheduling until we get that message.

## 2019-05-22 NOTE — Patient Instructions (Signed)
YOU NEED TO HAVE A COVID 19 TEST ON_______ @_______ , THIS TEST MUST BE DONE BEFORE SURGERY, COME TO [place] ENTRANCE. ONCE YOUR COVID TEST IS COMPLETED, PLEASE BEGIN THE QUARANTINE INSTRUCTIONS AS OUTLINED IN YOUR HANDOUT.                [name]    Your procedure is scheduled on: 05-27-2019  Report to [facility] Main  Entrance  Report to radiology at 800 AM      Call this number if you have problems the morning of surgery [phone]    Remember: Do not eat food or drink liquids :After Midnight. BRUSH YOUR TEETH MORNING OF SURGERY AND RINSE YOUR MOUTH OUT, NO CHEWING GUM CANDY OR MINTS.     Take these medicines the morning of surgery with A SIP OF WATER:                                 You may not have any metal on your body including hair pins and              piercings  Do not wear jewelry, make-up, lotions, powders or perfumes, deodorant             Do not wear nail polish.  Do not shave  48 hours prior to surgery.             Do not bring valuables to the hospital. [place].  Contacts, dentures or bridgework may not be worn into surgery.  Leave suitcase in the car. After surgery it may be brought to your room.     _____________________________________________________________________             [facility] - Preparing for Surgery Before surgery, you can play an important role.  Because skin is not sterile, your skin needs to be as free of germs as possible.  You can reduce the number of germs on your skin by washing with CHG (chlorahexidine gluconate) soap before surgery.  CHG is an antiseptic cleaner which kills germs and bonds with the skin to continue killing germs even after washing. Please DO NOT use if you have an allergy to CHG or antibacterial soaps.  If your skin becomes reddened/irritated stop using the CHG and inform your nurse when you arrive at Short Stay. Do not  shave (including legs and underarms) for at least 48 hours prior to the first CHG shower.  You may shave your face/neck. Please follow these instructions carefully:  1.  Shower with CHG Soap the night before surgery and the  morning of Surgery.  2.  If you choose to wash your hair, wash your hair first as usual with your  normal  shampoo.  3.  After you shampoo, rinse your hair and body thoroughly to remove the  shampoo.                           4.  Use CHG as you would any other liquid soap.  You can apply chg directly  to the skin and wash                       Gently with a scrungie or clean washcloth.  5.  Apply the CHG Soap to your body ONLY FROM THE NECK DOWN.   Do not use on face/ open                           Wound or open sores. Avoid contact with eyes, ears mouth and genitals (private parts).                       Wash face,  Genitals (private parts) with your normal soap.             6.  Wash thoroughly, paying special attention to the area where your surgery  will be performed.  7.  Thoroughly rinse your body with warm water from the neck down.  8.  DO NOT shower/wash with your normal soap after using and rinsing off  the CHG Soap.                9.  Pat yourself dry with a clean towel.            10.  Wear clean pajamas.            11.  Place clean sheets on your bed the night of your first shower and do not  sleep with pets. Day of Surgery : Do not apply any lotions/deodorants the morning of surgery.  Please wear clean clothes to the hospital/surgery center.  FAILURE TO FOLLOW THESE INSTRUCTIONS MAY RESULT IN THE CANCELLATION OF YOUR SURGERY PATIENT SIGNATURE_________________________________  NURSE SIGNATURE__________________________________  ________________________________________________________________________

## 2019-05-22 NOTE — Telephone Encounter (Signed)
It looks like she is trying to possibly schedule a treadmill test. We are still not doing these right now due to covid. Also from what I can tell Hochrein wanted this prior to surgery. Sending to Hochrein's nurse for recommendations.

## 2019-05-22 NOTE — Telephone Encounter (Signed)
Discussed with K Bowmaun who does GXT's who is under the impression these still need approval of Dr Meda Coffee. Will forward to Dr Percival Spanish for review   Patient scheduled for surgery 7/6

## 2019-05-23 ENCOUNTER — Encounter (HOSPITAL_COMMUNITY)
Admission: RE | Admit: 2019-05-23 | Discharge: 2019-05-23 | Disposition: A | Discharge status: Home / Self Care | Payer: Medicare Other | Source: Ambulatory Visit | Attending: Urology | Admitting: Urology

## 2019-05-23 ENCOUNTER — Other Ambulatory Visit (HOSPITAL_COMMUNITY): Payer: Medicare Other

## 2019-05-27 ENCOUNTER — Encounter (HOSPITAL_COMMUNITY): Admission: RE | Payer: Self-pay | Source: Other Acute Inpatient Hospital

## 2019-05-27 ENCOUNTER — Ambulatory Visit (HOSPITAL_COMMUNITY): Payer: Medicare Other

## 2019-05-27 ENCOUNTER — Other Ambulatory Visit (HOSPITAL_COMMUNITY): Payer: Medicare Other

## 2019-05-27 ENCOUNTER — Ambulatory Visit (HOSPITAL_COMMUNITY): Admission: RE | Admit: 2019-05-27 | Payer: Medicare Other | Source: Other Acute Inpatient Hospital | Admitting: Urology

## 2019-05-27 SURGERY — NEPHROLITHOTOMY PERCUTANEOUS
Anesthesia: General | Laterality: Left

## 2019-05-30 ENCOUNTER — Telehealth: Payer: Self-pay | Admitting: *Deleted

## 2019-05-30 NOTE — Telephone Encounter (Signed)
Left message to call back  Needs COVID 19 scheduled

## 2019-05-30 NOTE — Telephone Encounter (Signed)
Advised patient and scheduled

## 2019-05-30 NOTE — Telephone Encounter (Signed)
-----   Message from Caprice Kluver sent at 05/28/2019 12:29 PM EDT ----- Regarding: FW: Order Covid testing for GXT [name] just told me you were now working with Hochrein and not her.  Please see below need for Covid testing. ----- Message ----- From: Caprice Kluver Sent: 05/28/2019  10:47 AM EDT To: Vennie Homans Subject: Order Covid testing for GXT                    Hi [name],  There is a GXT scheduled on [name] a patient of Dr. Percival Spanish on Wednesday 06/05/19.  I will need you to call her and schedule/order Covid testing by Saturday July 11th ([facility] M-F hours 9-3:30, Sat hours 9-12:30) and instruct her to self isolate after she has the covid testing performed until her GXT on July 15th.  If she does not have the covid testing performed and if we don't have the testing results back prior to the GXT appointment we will have to cancel the test.  Thanks for your help.  Please let me know if you have any questions.  Thanks, Shirlean Mylar

## 2019-05-31 NOTE — Telephone Encounter (Signed)
Patient scheduled for 7/15

## 2019-06-01 ENCOUNTER — Other Ambulatory Visit (HOSPITAL_COMMUNITY)
Admission: RE | Admit: 2019-06-01 | Discharge: 2019-06-01 | Disposition: A | Discharge status: Home / Self Care | Payer: Medicare Other | Source: Ambulatory Visit | Attending: Family Medicine | Admitting: Family Medicine

## 2019-06-01 DIAGNOSIS — Z1159 Encounter for screening for other viral diseases: Secondary | ICD-10-CM | POA: Insufficient documentation

## 2019-06-01 DIAGNOSIS — Z01812 Encounter for preprocedural laboratory examination: Secondary | ICD-10-CM | POA: Insufficient documentation

## 2019-06-01 LAB — SARS CORONAVIRUS 2 (TAT 6-24 HRS): SARS Coronavirus 2: NEGATIVE

## 2019-06-05 ENCOUNTER — Other Ambulatory Visit: Payer: Self-pay

## 2019-06-05 ENCOUNTER — Ambulatory Visit (HOSPITAL_COMMUNITY)
Admission: RE | Admit: 2019-06-05 | Discharge: 2019-06-05 | Disposition: A | Discharge status: Home / Self Care | Payer: Medicare Other | Source: Ambulatory Visit | Attending: Cardiovascular Disease | Admitting: Cardiovascular Disease

## 2019-06-05 DIAGNOSIS — Z20828 Contact with and (suspected) exposure to other viral communicable diseases: Secondary | ICD-10-CM | POA: Diagnosis not present

## 2019-06-05 DIAGNOSIS — N289 Disorder of kidney and ureter, unspecified: Secondary | ICD-10-CM | POA: Diagnosis not present

## 2019-06-05 DIAGNOSIS — R0602 Shortness of breath: Secondary | ICD-10-CM | POA: Diagnosis not present

## 2019-06-05 DIAGNOSIS — E039 Hypothyroidism, unspecified: Secondary | ICD-10-CM | POA: Diagnosis not present

## 2019-06-05 LAB — EXERCISE TOLERANCE TEST
Estimated workload: 10.1 METS
Exercise duration (min): 8 min
Exercise duration (sec): 20 s
MPHR: 148 {beats}/min
Peak HR: 123 {beats}/min
Percent HR: 83 %
Rest HR: 47 {beats}/min

## 2019-06-10 DIAGNOSIS — D225 Melanocytic nevi of trunk: Secondary | ICD-10-CM | POA: Diagnosis not present

## 2019-06-10 DIAGNOSIS — L814 Other melanin hyperpigmentation: Secondary | ICD-10-CM | POA: Diagnosis not present

## 2019-06-10 DIAGNOSIS — D485 Neoplasm of uncertain behavior of skin: Secondary | ICD-10-CM | POA: Diagnosis not present

## 2019-06-10 DIAGNOSIS — L821 Other seborrheic keratosis: Secondary | ICD-10-CM | POA: Diagnosis not present

## 2019-06-10 DIAGNOSIS — Z85828 Personal history of other malignant neoplasm of skin: Secondary | ICD-10-CM | POA: Diagnosis not present

## 2019-06-10 DIAGNOSIS — L570 Actinic keratosis: Secondary | ICD-10-CM | POA: Diagnosis not present

## 2019-06-12 ENCOUNTER — Telehealth: Payer: Self-pay | Admitting: *Deleted

## 2019-06-12 NOTE — Telephone Encounter (Signed)
-----   Message from Minus Breeding, MD sent at 06/07/2019  5:56 PM EDT ----- I reviewed this study and I see no evidence of ischemia.  No further work up.  Call Ms. Lovena Le with the results and send results to Hulan Fess, MD

## 2019-06-12 NOTE — Telephone Encounter (Signed)
Released in [place], viewed by patient  Sent to PCP

## 2019-06-21 ENCOUNTER — Telehealth: Payer: Self-pay | Admitting: Cardiology

## 2019-06-21 NOTE — Telephone Encounter (Signed)
   Primary Cardiologist: Minus Breeding, MD  Chart reviewed. Pt recently seen by Dr. Percival Spanish for SOB and he recommended stress test prior to clearing for surgery.  Stress test was completed 7/15. Results reviewed by Dr. Percival Spanish. No evidence of ischemia. No further work-up (see phone note 7/22).   Given past medical history and time since last visit, based on ACC/AHA guidelines, [name] would be at acceptable risk for the planned procedure without further cardiovascular testing.   I will route this recommendation to the requesting party via Epic fax function and remove from pre-op pool.  Please call with questions.  Lyda Jester, PA-C 06/21/2019, 12:13 PM

## 2019-06-21 NOTE — Telephone Encounter (Signed)
New Message        [facility] Pre-operative Risk Assessment    Request for surgical clearance:  1. What type of surgery is being performed? Percutaneous Nephrolithotomy   2. When is this surgery scheduled? TBD Waiting to be cleared with Cardiology   3. What type of clearance is required (medical clearance vs. Pharmacy clearance to hold med vs. Both)? Not sure   4. Are there any medications that need to be held prior to surgery and how long? Not sure   5. Practice name and name of physician performing surgery? [facility] Dr Annie Main dahlstedt  6. What is your office phone number  [phone]    7.   What is your office fax number [phone]  8.   Anesthesia type (None, local, MAC, general) ? General  2 hr procedure    [name] 06/21/2019, 11:41 AM  _________________________________________________________________   (provider comments below)

## 2019-06-24 ENCOUNTER — Other Ambulatory Visit: Payer: Self-pay | Admitting: Urology

## 2019-06-25 ENCOUNTER — Ambulatory Visit: Payer: Medicare Other | Admitting: Nurse Practitioner

## 2019-07-10 DIAGNOSIS — H524 Presbyopia: Secondary | ICD-10-CM | POA: Diagnosis not present

## 2019-07-10 DIAGNOSIS — H2513 Age-related nuclear cataract, bilateral: Secondary | ICD-10-CM | POA: Diagnosis not present

## 2019-07-22 DIAGNOSIS — F329 Major depressive disorder, single episode, unspecified: Secondary | ICD-10-CM | POA: Diagnosis not present

## 2019-07-22 DIAGNOSIS — I10 Essential (primary) hypertension: Secondary | ICD-10-CM | POA: Diagnosis not present

## 2019-07-22 DIAGNOSIS — E039 Hypothyroidism, unspecified: Secondary | ICD-10-CM | POA: Diagnosis not present

## 2019-07-22 DIAGNOSIS — D500 Iron deficiency anemia secondary to blood loss (chronic): Secondary | ICD-10-CM | POA: Diagnosis not present

## 2019-07-22 DIAGNOSIS — M170 Bilateral primary osteoarthritis of knee: Secondary | ICD-10-CM | POA: Diagnosis not present

## 2019-07-22 DIAGNOSIS — E7800 Pure hypercholesterolemia, unspecified: Secondary | ICD-10-CM | POA: Diagnosis not present

## 2019-07-22 DIAGNOSIS — C50919 Malignant neoplasm of unspecified site of unspecified female breast: Secondary | ICD-10-CM | POA: Diagnosis not present

## 2019-07-22 DIAGNOSIS — Z853 Personal history of malignant neoplasm of breast: Secondary | ICD-10-CM | POA: Diagnosis not present

## 2019-07-24 ENCOUNTER — Ambulatory Visit: Payer: Self-pay | Admitting: Orthopaedic Surgery

## 2019-07-24 ENCOUNTER — Other Ambulatory Visit: Payer: Self-pay | Admitting: Family Medicine

## 2019-07-24 DIAGNOSIS — Z1231 Encounter for screening mammogram for malignant neoplasm of breast: Secondary | ICD-10-CM

## 2019-07-25 ENCOUNTER — Ambulatory Visit: Payer: Medicare Other | Admitting: Orthopaedic Surgery

## 2019-07-25 ENCOUNTER — Ambulatory Visit (INDEPENDENT_AMBULATORY_CARE_PROVIDER_SITE_OTHER): Payer: Medicare Other | Admitting: Orthopaedic Surgery

## 2019-07-25 ENCOUNTER — Encounter: Payer: Self-pay | Admitting: Orthopaedic Surgery

## 2019-07-25 ENCOUNTER — Other Ambulatory Visit: Payer: Self-pay

## 2019-07-25 VITALS — BP 112/71 | HR 44 | Ht 65.5 in | Wt 131.0 lb

## 2019-07-25 DIAGNOSIS — M1711 Unilateral primary osteoarthritis, right knee: Secondary | ICD-10-CM | POA: Diagnosis not present

## 2019-07-25 NOTE — Progress Notes (Signed)
Office Visit Note   Patient: [name]           Date of Birth: Jan 27, 1946           MRN: [id number] Visit Date: 07/25/2019              Requested by: Hulan Fess, MD [place],  [place] [address] PCP: Hulan Fess, MD   Assessment & Plan: Visit Diagnoses:  1. Unilateral primary osteoarthritis, right knee     Plan: 11 months status post primary right total knee replacement and happy the results.  No fever chills or use of ambulatory aid.  Not taking any medicines for her knee.  Exam demonstrates excellent motion and no instability.  No localized areas of tenderness.  Encouraged to work on her exercises and return as needed.  Aware of need to take antibiotics for any invasive procedure for at least 2 years  Follow-Up Instructions: Return if symptoms worsen or fail to improve.   Orders:  No orders of the defined types were placed in this encounter.  No orders of the defined types were placed in this encounter.     Procedures: No procedures performed   Clinical Data: No additional findings.   Subjective: Chief Complaint  Patient presents with  . Right Knee - Follow-up    Right TKA DOS 08/28/18  Patient presents today for follow up on her right knee. She is now 11 months out from right total knee arthroplasty. Patient states that she is doing well, no complaints. No problems referable to her knee replacement.  Happy that She had the procedure.  Now off the Xarelto from her pulmonary emboli without any sequelae. HPI  Review of Systems  Constitutional: Negative for fatigue.  HENT: Negative for ear pain.   Eyes: Negative for pain.  Respiratory: Negative for shortness of breath.   Cardiovascular: Negative for leg swelling.  Gastrointestinal: Positive for constipation. Negative for diarrhea.  Endocrine: Negative for cold intolerance and heat intolerance.  Genitourinary: Negative for difficulty urinating.  Musculoskeletal: Negative for joint  swelling.  Skin: Negative for rash.  Allergic/Immunologic: Negative for food allergies.  Neurological: Negative for weakness.  Hematological: Does not bruise/bleed easily.  Psychiatric/Behavioral: Negative for sleep disturbance.     Objective: Vital Signs: BP 112/71   Pulse (!) 44   Ht 5' 5.5" (1.664 m)   Wt 131 lb (59.4 kg)   BMI 21.47 kg/m   Physical Exam Constitutional:      Appearance: She is well-developed.  Eyes:     Pupils: Pupils are equal, round, and reactive to light.  Pulmonary:     Effort: Pulmonary effort is normal.  Skin:    General: Skin is warm and dry.  Neurological:     Mental Status: She is alert and oriented to person, place, and time.  Psychiatric:        Behavior: Behavior normal.     Ortho Exam awake alert and oriented x3.  Comfortable sitting.  Full extension right knee.  Knee was not hot warm or red.  No effusion.  No opening with varus valgus stress.  Flexed over 105 degrees.  No localized areas of tenderness.  No calf pain.  No distal edema.  Neurologically intact  Specialty Comments:  No specialty comments available.  Imaging: No results found.   PMFS History: Patient Active Problem List   Diagnosis Date Noted  . Hypothyroidism 10/03/2018  . PE (pulmonary thromboembolism) ([place]) 10/03/2018  . Chronic diastolic CHF (  congestive heart failure) ([place]) 10/03/2018  . Macrocytic anemia 10/03/2018  . Unilateral primary osteoarthritis, right knee 08/28/2018  . History of total right knee replacement 08/28/2018  . Bilateral primary osteoarthritis of knee 03/12/2018  . Essential hypertension 08/26/2016  . Mitral valve disorder 08/26/2016  . History of palpitations 08/26/2016  . Dyslipidemia 08/26/2016  . Internal hemorrhoids 08/03/2015  . Rectal bleeding 08/03/2015  . Hx of radiation therapy   . Breast cancer of upper-outer quadrant of right female breast ([place]) 05/23/2013  . Personal history of colonic polyps 01/06/2012  . Irritable bowel  syndrome - constipation predominant 01/06/2012   Past Medical History:  Diagnosis Date  . Adenomatous colon polyp 03/07/2012  . Allergic rhinitis   . Arthritis    "knees" (08/28/2018)  . Breast cancer, right breast ([place]) 05/20/13   "no chemo" (08/28/2018)  . Chronic lower back pain   . Diverticulosis   . Graves' disease    2011  . Graves' disease   . History of kidney stones   . HLD (hyperlipidemia)   . Hx of radiation therapy 08/13/13- 09/06/13   right breast 4250 cGy 17 sessions  . Hypertension   . IBS (irritable bowel syndrome)   . Lumbar herniated disc    L4-L5  . Osteopenia   . Personal history of radiation therapy   . PONV (postoperative nausea and vomiting)    "when I have general anesthetic" (08/28/2018)  . PVC (premature ventricular contraction)    Takes metoprolol PRN for  pvc's    Family History  Problem Relation Age of Onset  . Heart disease Mother 18       MI  . Lung cancer Mother   . Hypertension Mother   . Alcohol abuse Father   . COPD Father   . Colon cancer Maternal Grandfather 72  . Diabetes Paternal Grandmother   . Breast cancer Maternal Aunt   . Pancreatic cancer Maternal Aunt   . Stomach cancer Neg Hx     Past Surgical History:  Procedure Laterality Date  . BREAST BIOPSY Right 2014  . BREAST LUMPECTOMY WITH NEEDLE LOCALIZATION AND AXILLARY SENTINEL LYMPH NODE BX Right 06/11/2013   Procedure: BREAST LUMPECTOMY WITH NEEDLE LOCALIZATION AND AXILLARY SENTINEL LYMPH NODE BX;  Surgeon: Harl Bowie, MD;  Location: [facility];  Service: General;  Laterality: Right;  Needle localization BCG 7:30  . COLONOSCOPY W/ BIOPSIES AND POLYPECTOMY  multiple  . JOINT REPLACEMENT    . KNEE ARTHROSCOPY Left 2006  . TOTAL KNEE ARTHROPLASTY Right 08/28/2018  . TOTAL KNEE ARTHROPLASTY Right 08/28/2018   Procedure: RIGHT TOTAL KNEE ARTHROPLASTY;  Surgeon: Garald Balding, MD;  Location: [facility];  Service: Orthopedics;  Laterality: Right;   Social History    Occupational History  . Occupation: Programmer, multimedia: UNEMPLOYED  Tobacco Use  . Smoking status: Never Smoker  . Smokeless tobacco: Never Used  Substance and Sexual Activity  . Alcohol use: Yes    Alcohol/week: 0.0 standard drinks    Comment: 08/28/2018 "1-2 drinks/month"  . Drug use: Never  . Sexual activity: Not Currently    Comment: menarche 78, 76 1 age 34, menopause 5, HRT x 9 yrs

## 2019-07-26 ENCOUNTER — Other Ambulatory Visit: Payer: Self-pay | Admitting: Family Medicine

## 2019-07-26 DIAGNOSIS — M8580 Other specified disorders of bone density and structure, unspecified site: Secondary | ICD-10-CM

## 2019-08-05 DIAGNOSIS — N200 Calculus of kidney: Secondary | ICD-10-CM | POA: Diagnosis not present

## 2019-08-05 DIAGNOSIS — R310 Gross hematuria: Secondary | ICD-10-CM | POA: Diagnosis not present

## 2019-08-06 ENCOUNTER — Encounter (HOSPITAL_COMMUNITY): Payer: Self-pay

## 2019-08-06 NOTE — Patient Instructions (Signed)
DUE TO COVID-19 ONLY ONE VISITOR IS ALLOWED TO COME WITH YOU AND STAY IN THE WAITING ROOM ONLY DURING PRE OP AND PROCEDURE. THE ONE VISITOR MAY VISIT WITH YOU IN YOUR PRIVATE ROOM DURING VISITING HOURS ONLY!!   COVID SWAB TESTING MUST BE COMPLETED ON: Thursday, Sept. 17, 2020 at     [address], [place] [facility] enter pre surgical testing line (Must self quarantine after testing. Follow instructions on handout.)             Your procedure is scheduled on: Monday, Sept. 21, 2020   Report to [facility] Main  Entrance    Report to admitting at 7:45 AM   Call this number if you have problems the morning of surgery [phone]   Do not eat food or drink liquids :After Midnight.   Brush your teeth the morning of surgery.   Do NOT smoke after Midnight   Take these medicines the morning of surgery with A SIP OF WATER: Bupropion, Metoprolol, Synthroid   May use Flonase day of surgery                               You may not have any metal on your body including hair pins, jewelry, and body piercings             Do not wear make-up, lotions, powders, perfumes/cologne, or deodorant             Do not wear nail polish.  Do not shave  48 hours prior to surgery.                Do not bring valuables to the hospital. [place].   Contacts, dentures or bridgework may not be worn into surgery.   Bring small overnight bag day of surgery.    Special Instructions: Bring a copy of your healthcare power of attorney and living will documents         the day of surgery if you haven't scanned them in before.              Please read over the following fact sheets you were given:  [facility] - Preparing for Surgery Before surgery, you can play an important role.  Because skin is not sterile, your skin needs to be as free of germs as possible.  You can reduce the number of germs on your skin by washing with CHG  (chlorahexidine gluconate) soap before surgery.  CHG is an antiseptic cleaner which kills germs and bonds with the skin to continue killing germs even after washing. Please DO NOT use if you have an allergy to CHG or antibacterial soaps.  If your skin becomes reddened/irritated stop using the CHG and inform your nurse when you arrive at Short Stay. Do not shave (including legs and underarms) for at least 48 hours prior to the first CHG shower.  You may shave your face/neck.  Please follow these instructions carefully:  1.  Shower with CHG Soap the night before surgery and the  morning of surgery.  2.  If you choose to wash your hair, wash your hair first as usual with your normal  shampoo.  3.  After you shampoo, rinse your hair and body thoroughly to remove the shampoo.  4.  Use CHG as you would any other liquid soap.  You can apply chg directly to the skin and wash.  Gently with a scrungie or clean washcloth.  5.  Apply the CHG Soap to your body ONLY FROM THE NECK DOWN.   Do   not use on face/ open                           Wound or open sores. Avoid contact with eyes, ears mouth and   genitals (private parts).                       Wash face,  Genitals (private parts) with your normal soap.             6.  Wash thoroughly, paying special attention to the area where your    surgery  will be performed.  7.  Thoroughly rinse your body with warm water from the neck down.  8.  DO NOT shower/wash with your normal soap after using and rinsing off the CHG Soap.                9.  Pat yourself dry with a clean towel.            10.  Wear clean pajamas.            11.  Place clean sheets on your bed the night of your first shower and do not  sleep with pets. Day of Surgery : Do not apply any lotions/deodorants the morning of surgery.  Please wear clean clothes to the hospital/surgery center.  FAILURE TO FOLLOW THESE INSTRUCTIONS MAY RESULT IN THE CANCELLATION OF YOUR  SURGERY  PATIENT SIGNATURE_________________________________  NURSE SIGNATURE__________________________________  ________________________________________________________________________  WHAT IS A BLOOD TRANSFUSION? Blood Transfusion Information  A transfusion is the replacement of blood or some of its parts. Blood is made up of multiple cells which provide different functions.  Red blood cells carry oxygen and are used for blood loss replacement.  White blood cells fight against infection.  Platelets control bleeding.  Plasma helps clot blood.  Other blood products are available for specialized needs, such as hemophilia or other clotting disorders. BEFORE THE TRANSFUSION  Who gives blood for transfusions?   Healthy volunteers who are fully evaluated to make sure their blood is safe. This is blood bank blood. Transfusion therapy is the safest it has ever been in the practice of medicine. Before blood is taken from a donor, a complete history is taken to make sure that person has no history of diseases nor engages in risky social behavior (examples are intravenous drug use or sexual activity with multiple partners). The donor's travel history is screened to minimize risk of transmitting infections, such as malaria. The donated blood is tested for signs of infectious diseases, such as HIV and hepatitis. The blood is then tested to be sure it is compatible with you in order to minimize the chance of a transfusion reaction. If you or a relative donates blood, this is often done in anticipation of surgery and is not appropriate for emergency situations. It takes many days to process the donated blood. RISKS AND COMPLICATIONS Although transfusion therapy is very safe and saves many lives, the main dangers of transfusion include:   Getting an infectious disease.  Developing a transfusion reaction. This is an allergic reaction to something in the blood you were given. Every precaution is  taken  to prevent this. The decision to have a blood transfusion has been considered carefully by your caregiver before blood is given. Blood is not given unless the benefits outweigh the risks. AFTER THE TRANSFUSION  Right after receiving a blood transfusion, you will usually feel much better and more energetic. This is especially true if your red blood cells have gotten low (anemic). The transfusion raises the level of the red blood cells which carry oxygen, and this usually causes an energy increase.  The nurse administering the transfusion will monitor you carefully for complications. HOME CARE INSTRUCTIONS  No special instructions are needed after a transfusion. You may find your energy is better. Speak with your caregiver about any limitations on activity for underlying diseases you may have. SEEK MEDICAL CARE IF:   Your condition is not improving after your transfusion.  You develop redness or irritation at the intravenous (IV) site. SEEK IMMEDIATE MEDICAL CARE IF:  Any of the following symptoms occur over the next 12 hours:  Shaking chills.  You have a temperature by mouth above 102 F (38.9 C), not controlled by medicine.  Chest, back, or muscle pain.  People around you feel you are not acting correctly or are confused.  Shortness of breath or difficulty breathing.  Dizziness and fainting.  You get a rash or develop hives.  You have a decrease in urine output.  Your urine turns a dark color or changes to pink, red, or brown. Any of the following symptoms occur over the next 10 days:  You have a temperature by mouth above 102 F (38.9 C), not controlled by medicine.  Shortness of breath.  Weakness after normal activity.  The white part of the eye turns yellow (jaundice).  You have a decrease in the amount of urine or are urinating less often.  Your urine turns a dark color or changes to pink, red, or brown. Document Released: 11/04/2000 Document Revised: 01/30/2012  Document Reviewed: 06/23/2008 [facility] Patient Information 2014 [place], [place].  _______________________________________________________________________

## 2019-08-07 ENCOUNTER — Encounter (HOSPITAL_COMMUNITY)
Admission: RE | Admit: 2019-08-07 | Discharge: 2019-08-07 | Disposition: A | Discharge status: Home / Self Care | Payer: Medicare Other | Source: Ambulatory Visit | Attending: Urology | Admitting: Urology

## 2019-08-07 ENCOUNTER — Other Ambulatory Visit: Payer: Self-pay

## 2019-08-07 ENCOUNTER — Encounter (HOSPITAL_COMMUNITY): Payer: Self-pay

## 2019-08-07 DIAGNOSIS — Z01812 Encounter for preprocedural laboratory examination: Secondary | ICD-10-CM | POA: Insufficient documentation

## 2019-08-07 HISTORY — DX: Nonrheumatic mitral (valve) prolapse: I34.1

## 2019-08-07 HISTORY — DX: Dyspnea, unspecified: R06.00

## 2019-08-07 HISTORY — DX: Other pulmonary embolism without acute cor pulmonale: I26.99

## 2019-08-07 HISTORY — DX: Palpitations: R00.2

## 2019-08-07 HISTORY — DX: Other forms of dyspnea: R06.09

## 2019-08-07 LAB — CBC
HCT: 37.8 % (ref 36.0–46.0)
Hemoglobin: 11.1 g/dL — ABNORMAL LOW (ref 12.0–15.0)
MCH: 26 pg (ref 26.0–34.0)
MCHC: 29.4 g/dL — ABNORMAL LOW (ref 30.0–36.0)
MCV: 88.5 fL (ref 80.0–100.0)
Platelets: 296 10*3/uL (ref 150–400)
RBC: 4.27 MIL/uL (ref 3.87–5.11)
RDW: 18.3 % — ABNORMAL HIGH (ref 11.5–15.5)
WBC: 4.4 10*3/uL (ref 4.0–10.5)
nRBC: 0 % (ref 0.0–0.2)

## 2019-08-07 LAB — BASIC METABOLIC PANEL
Anion gap: 6 (ref 5–15)
BUN: 16 mg/dL (ref 8–23)
CO2: 29 mmol/L (ref 22–32)
Calcium: 9.5 mg/dL (ref 8.9–10.3)
Chloride: 104 mmol/L (ref 98–111)
Creatinine, Ser: 0.9 mg/dL (ref 0.44–1.00)
GFR calc Af Amer: >60 mL/min (ref >60)
GFR calc non Af Amer: >60 mL/min (ref >60)
Glucose, Bld: 87 mg/dL (ref 70–99)
Potassium: 4.6 mmol/L (ref 3.5–5.1)
Sodium: 139 mmol/L (ref 135–145)

## 2019-08-07 LAB — ABO/RH: ABO/RH(D): O POS

## 2019-08-07 NOTE — Progress Notes (Signed)
PCP - Dr. Hulan Fess Cardiologist - Dr. Minus Breeding  Chest x-ray - 08/18/2018 in epic EKG - 10/04/2018 in epic Stress Test - 06/05/2019 in epic ECHO - 10/04/2018 in epic Cardiac Cath - N/A  Sleep Study - N/A CPAP - N/A  Fasting Blood Sugar - N/A Checks Blood Sugar N/A_____ times a day  Blood Thinner Instructions:N/A Aspirin Instructions:N/A Last Dose:N/A  Anesthesia review: N/A  Patient denies shortness of breath, fever, cough and chest pain at PAT appointment   Patient verbalized understanding of instructions that were given to them at the PAT appointment. Patient was also instructed that they will need to review over the PAT instructions again at home before surgery.

## 2019-08-07 NOTE — Progress Notes (Signed)
SPOKE W/  Neoma Laming     SCREENING SYMPTOMS OF COVID 19:   COUGH--NO  RUNNY NOSE--- NO  SORE THROAT---NO  NASAL CONGESTION----NO  SNEEZING----NO  SHORTNESS OF BREATH---NO  DIFFICULTY BREATHING---NO  TEMP >100.0 -----NO  UNEXPLAINED BODY ACHES------NO  CHILLS -------- NO  HEADACHES ---------NO  LOSS OF SMELL/ TASTE --------NO    HAVE YOU OR ANY FAMILY MEMBER TRAVELLED PAST 14 DAYS OUT OF THE   COUNTY---Travelled to [facility] STATE----NO COUNTRY----NO  HAVE YOU OR ANY FAMILY MEMBER BEEN EXPOSED TO ANYONE WITH COVID 19? NO

## 2019-08-08 ENCOUNTER — Other Ambulatory Visit (HOSPITAL_COMMUNITY)
Admission: RE | Admit: 2019-08-08 | Discharge: 2019-08-08 | Disposition: A | Discharge status: Home / Self Care | Payer: Medicare Other | Source: Ambulatory Visit | Attending: Urology | Admitting: Urology

## 2019-08-08 DIAGNOSIS — Z20828 Contact with and (suspected) exposure to other viral communicable diseases: Secondary | ICD-10-CM | POA: Insufficient documentation

## 2019-08-08 DIAGNOSIS — Z01812 Encounter for preprocedural laboratory examination: Secondary | ICD-10-CM | POA: Diagnosis not present

## 2019-08-08 NOTE — Anesthesia Preprocedure Evaluation (Addendum)
Anesthesia Evaluation  Patient identified by MRN, date of birth, ID band Patient awake    Reviewed: Allergy & Precautions, NPO status , Patient's Chart, lab work & pertinent test results, reviewed documented beta blocker date and time   History of Anesthesia Complications (+) PONV and history of anesthetic complications  Airway Mallampati: II  TM Distance: >3 FB Neck ROM: Full    Dental  (+) Dental Advisory Given, Teeth Intact   Pulmonary pneumonia,    Pulmonary exam normal breath sounds clear to auscultation       Cardiovascular hypertension, Pt. on home beta blockers and Pt. on medications +CHF and + DOE  Normal cardiovascular exam+ Valvular Problems/Murmurs MR  Rhythm:Regular Rate:Normal  Echo 09/2018 - Left ventricle: The cavity size was normal. Wall thickness was normal. Systolic function was normal. The estimated ejection  fraction was in the range of 60% to 65%. Wall motion was normal; there were no regional wall motion abnormalities. Doppler  parameters are consistent with abnormal left ventricular relaxation (grade 1 diastolic dysfunction). - Aortic valve: Valve area (VTI): 2.59 cm^2. Valve area (Vmax):  2.76 cm^2. Valve area (Vmean): 2.64 cm^2. - Mitral valve: There was mild regurgitation. Valve area by   pressure half-time: 2.22 cm^2.   Neuro/Psych negative neurological ROS  negative psych ROS   GI/Hepatic negative GI ROS, Neg liver ROS,   Endo/Other  Hypothyroidism Hyperthyroidism   Renal/GU negative Renal ROS     Musculoskeletal  (+) Arthritis ,   Abdominal   Peds  Hematology  (+) Blood dyscrasia, anemia ,   Anesthesia Other Findings   Reproductive/Obstetrics negative OB ROS                                                             Anesthesia Evaluation  Patient identified by MRN, date of birth, ID band Patient awake    Reviewed: Allergy & Precautions, NPO status ,  Patient's Chart, lab work & pertinent test results  History of Anesthesia Complications (+) PONV and history of anesthetic complications  Airway Mallampati: IV  TM Distance: >3 FB Neck ROM: Full    Dental no notable dental hx.    Pulmonary neg pulmonary ROS,    Pulmonary exam normal breath sounds clear to auscultation       Cardiovascular hypertension, Pt. on medications and Pt. on home beta blockers Normal cardiovascular exam Rhythm:Regular Rate:Normal  ECG: SB, rate 59   Neuro/Psych negative neurological ROS  negative psych ROS   GI/Hepatic Neg liver ROS, IBS (irritable bowel syndrome)   Endo/Other  Hypothyroidism   Renal/GU negative Renal ROS     Musculoskeletal  (+) Arthritis ,   Abdominal   Peds  Hematology HLD   Anesthesia Other Findings RIGHT KNEE OSTEOARTHRITIS  Reproductive/Obstetrics                            Anesthesia Physical Anesthesia Plan  ASA: II  Anesthesia Plan: Spinal and Regional   Post-op Pain Management:  Regional for Post-op pain   Induction:   PONV Risk Score and Plan: 3 and Midazolam, Dexamethasone, Ondansetron and Treatment may vary due to age or medical condition  Airway Management Planned: Natural Airway  Additional Equipment:   Intra-op Plan:   Post-operative Plan:  Informed Consent: I have reviewed the patients History and Physical, chart, labs and discussed the procedure including the risks, benefits and alternatives for the proposed anesthesia with the patient or authorized representative who has indicated his/her understanding and acceptance.   Dental advisory given  Plan Discussed with: CRNA  Anesthesia Plan Comments:         Anesthesia Quick Evaluation  Anesthesia Physical Anesthesia Plan  ASA: III  Anesthesia Plan: General   Post-op Pain Management:    Induction: Intravenous  PONV Risk Score and Plan: 4 or greater and Ondansetron, Dexamethasone and  Treatment may vary due to age or medical condition  Airway Management Planned: Oral ETT  Additional Equipment: None  Intra-op Plan:   Post-operative Plan: Extubation in OR  Informed Consent: I have reviewed the patients History and Physical, chart, labs and discussed the procedure including the risks, benefits and alternatives for the proposed anesthesia with the patient or authorized representative who has indicated his/her understanding and acceptance.     Dental advisory given  Plan Discussed with: CRNA  Anesthesia Plan Comments: (See PAT note 08/07/2019, Konrad Felix, PA-C)       Anesthesia Quick Evaluation

## 2019-08-08 NOTE — Progress Notes (Signed)
Anesthesia Chart Review   Case: [id number] Date/Time: 08/12/19 1045   Procedures:      NEPHROLITHOTOMY PERCUTANEOUS (Left ) - 2 HRS     HOLMIUM LASER APPLICATION (Left )   Anesthesia type: General   Pre-op diagnosis: LEFT RENAL CALCULI   Location: [facility]   Surgeon: Franchot Gallo, MD      DISCUSSION:73 y.o. never smoker with h/o PONV, HLD, PE, HTN, PVCs (prn metoprolol), Graves disease, MVP, left renal calculi scheduled for above procedure 08/12/2019 with Dr. Franchot Gallo.   ETT 06/05/2019 with no ischemia.  Cleared by cardiology.  Per Lyda Jester, PA-C, "Pt recently seen by Dr. Percival Spanish for SOB and he recommended stress test prior to clearing for surgery.  Stress test was completed 7/15. Results reviewed by Dr. Percival Spanish. No evidence of ischemia. No further work-up (see phone note 7/22). Given past medical history and time since last visit, based on ACC/AHA guidelines, [name] would be at acceptable risk for the planned procedure without further cardiovascular testing."  Anticipate pt can proceed with planned procedure barring acute status change.   VS: BP 130/69   Pulse 98   Temp 37 C (Oral)   Resp 18   Ht 5\' 5"  (1.651 m)   Wt 60.9 kg   SpO2 100%   BMI 22.33 kg/m   PROVIDERS: Hulan Fess, MD is PCP   Minus Breeding, MD is Cardiologist  LABS: Labs reviewed: Acceptable for surgery. (all labs ordered are listed, but only abnormal results are displayed)  Labs Reviewed  CBC - Abnormal; Notable for the following components:      Result Value   Hemoglobin 11.1 (*)    MCHC 29.4 (*)    RDW 18.3 (*)    All other components within normal limits  BASIC METABOLIC PANEL  TYPE AND SCREEN  ABO/RH     IMAGES:   EKG: 10/04/2018 Rate 93 bpm Sinus rhythm  Probable left atrial enlargement Probable anterior infarct, age undetermined  CV: 06/05/2019 ETT  Horizontal ST segment depression ST segment depression was noted during stress in  the II, III and aVF leads, beginning at 4 minutes of stress, and returning to baseline after less than 1 minute of recovery.  Blood pressure demonstrated a normal response to exercise.   Positive adequate treadmill stress test. 45mm horizontal ST depressions in exercise seen in II, III, aVF with intermediate Duke treadmill score.  I reviewed this study and I see no evidence of ischemia. No further work up. Call Ms. Lovena Le with the results and send results to Hulan Fess, MD  Echo 10/04/2018 Study Conclusions  - Left ventricle: The cavity size was normal. Wall thickness was   normal. Systolic function was normal. The estimated ejection   fraction was in the range of 60% to 65%. Wall motion was normal;   there were no regional wall motion abnormalities. Doppler   parameters are consistent with abnormal left ventricular   relaxation (grade 1 diastolic dysfunction). - Aortic valve: Valve area (VTI): 2.59 cm^2. Valve area (Vmax):   2.76 cm^2. Valve area (Vmean): 2.64 cm^2. - Mitral valve: There was mild regurgitation. Valve area by   pressure half-time: 2.22 cm^2.  Past Medical History:  Diagnosis Date  . Adenomatous colon polyp 03/07/2012  . Allergic rhinitis   . Arthritis    "knees" (08/28/2018)  . Breast cancer, right breast ([place]) 05/20/13   "no chemo" (08/28/2018)  . Chronic lower back pain   . Diverticulosis   .  DOE (dyspnea on exertion)   . Graves' disease    2011  . Heart palpitations   . History of kidney stones   . HLD (hyperlipidemia)   . Hx of radiation therapy 08/13/13- 09/06/13   right breast 4250 cGy 17 sessions  . Hypertension   . IBS (irritable bowel syndrome)   . Lumbar herniated disc    L4-L5  . MVP (mitral valve prolapse)   . Osteopenia   . Personal history of radiation therapy   . Pneumonia 08/2019  . PONV (postoperative nausea and vomiting)    "when I have general anesthetic" (08/28/2018)  . Pulmonary emboli ([facility])    after knee replacement  . PVC  (premature ventricular contraction)    Takes metoprolol PRN for  pvc's    Past Surgical History:  Procedure Laterality Date  . BREAST BIOPSY Right 2014  . BREAST LUMPECTOMY WITH NEEDLE LOCALIZATION AND AXILLARY SENTINEL LYMPH NODE BX Right 06/11/2013   Procedure: BREAST LUMPECTOMY WITH NEEDLE LOCALIZATION AND AXILLARY SENTINEL LYMPH NODE BX;  Surgeon: Harl Bowie, MD;  Location: [facility];  Service: General;  Laterality: Right;  Needle localization BCG 7:30  . COLONOSCOPY W/ BIOPSIES AND POLYPECTOMY  multiple  . KNEE ARTHROSCOPY Left 2006  . TOTAL KNEE ARTHROPLASTY Right 08/28/2018   Procedure: RIGHT TOTAL KNEE ARTHROPLASTY;  Surgeon: Garald Balding, MD;  Location: [facility];  Service: Orthopedics;  Laterality: Right;    MEDICATIONS: . atorvastatin (LIPITOR) 10 MG tablet  . buPROPion (WELLBUTRIN XL) 300 MG 24 hr tablet  . Calcium-Phosphorus-Vitamin D (CITRACAL +D3 PO)  . CANNABIDIOL PO  . Echinacea 450 MG CAPS  . fluticasone (FLONASE) 50 MCG/ACT nasal spray  . ibuprofen (ADVIL) 200 MG tablet  . letrozole (FEMARA) 2.5 MG tablet  . lisinopril (ZESTRIL) 10 MG tablet  . metoprolol tartrate (LOPRESSOR) 25 MG tablet  . Multiple Vitamin (MULTIVITAMIN WITH MINERALS) TABS tablet  . Probiotic Product (PROBIOTIC DAILY PO)  . senna (SENOKOT) 8.6 MG tablet  . SYNTHROID 88 MCG tablet  . tretinoin (RETIN-A) 0.025 % cream  . zolpidem (AMBIEN) 10 MG tablet   No current facility-administered medications for this encounter.      Maia Plan [facility] Pre-Surgical Testing [phone] 08/08/19  3:02 PM

## 2019-08-09 ENCOUNTER — Other Ambulatory Visit: Payer: Self-pay | Admitting: Radiology

## 2019-08-09 LAB — NOVEL CORONAVIRUS, NAA (HOSP ORDER, SEND-OUT TO REF LAB; TAT 18-24 HRS): SARS-CoV-2, NAA: NOT DETECTED

## 2019-08-12 ENCOUNTER — Encounter (HOSPITAL_COMMUNITY): Payer: Self-pay | Admitting: Anesthesiology

## 2019-08-12 ENCOUNTER — Ambulatory Visit (HOSPITAL_COMMUNITY)
Admission: RE | Admit: 2019-08-12 | Discharge: 2019-08-12 | Disposition: A | Discharge status: Home / Self Care | Payer: Medicare Other | Source: Ambulatory Visit | Attending: Urology | Admitting: Urology

## 2019-08-12 ENCOUNTER — Ambulatory Visit (HOSPITAL_COMMUNITY): Payer: Medicare Other | Admitting: Anesthesiology

## 2019-08-12 ENCOUNTER — Inpatient Hospital Stay (HOSPITAL_COMMUNITY)
Admission: RE | Admit: 2019-08-12 | Discharge: 2019-08-16 | DRG: 660 | Disposition: A | Discharge status: Home / Self Care | Payer: Medicare Other | Attending: Urology | Admitting: Urology

## 2019-08-12 ENCOUNTER — Encounter (HOSPITAL_COMMUNITY)
Admission: RE | Disposition: A | Discharge status: Home / Self Care | Payer: Self-pay | Source: Home / Self Care | Attending: Urology

## 2019-08-12 ENCOUNTER — Observation Stay (HOSPITAL_COMMUNITY): Payer: Medicare Other

## 2019-08-12 ENCOUNTER — Ambulatory Visit (HOSPITAL_COMMUNITY): Payer: Medicare Other | Admitting: Physician Assistant

## 2019-08-12 ENCOUNTER — Other Ambulatory Visit: Payer: Self-pay

## 2019-08-12 DIAGNOSIS — Z888 Allergy status to other drugs, medicaments and biological substances status: Secondary | ICD-10-CM

## 2019-08-12 DIAGNOSIS — N132 Hydronephrosis with renal and ureteral calculous obstruction: Secondary | ICD-10-CM | POA: Diagnosis not present

## 2019-08-12 DIAGNOSIS — M1712 Unilateral primary osteoarthritis, left knee: Secondary | ICD-10-CM | POA: Diagnosis not present

## 2019-08-12 DIAGNOSIS — Z86711 Personal history of pulmonary embolism: Secondary | ICD-10-CM

## 2019-08-12 DIAGNOSIS — Z87442 Personal history of urinary calculi: Secondary | ICD-10-CM

## 2019-08-12 DIAGNOSIS — Z560 Unemployment, unspecified: Secondary | ICD-10-CM | POA: Diagnosis not present

## 2019-08-12 DIAGNOSIS — I5032 Chronic diastolic (congestive) heart failure: Secondary | ICD-10-CM | POA: Diagnosis not present

## 2019-08-12 DIAGNOSIS — Z7901 Long term (current) use of anticoagulants: Secondary | ICD-10-CM

## 2019-08-12 DIAGNOSIS — Z923 Personal history of irradiation: Secondary | ICD-10-CM

## 2019-08-12 DIAGNOSIS — I341 Nonrheumatic mitral (valve) prolapse: Secondary | ICD-10-CM | POA: Diagnosis present

## 2019-08-12 DIAGNOSIS — N200 Calculus of kidney: Secondary | ICD-10-CM | POA: Diagnosis not present

## 2019-08-12 DIAGNOSIS — T83122A Displacement of urinary stent, initial encounter: Secondary | ICD-10-CM | POA: Diagnosis not present

## 2019-08-12 DIAGNOSIS — M8580 Other specified disorders of bone density and structure, unspecified site: Secondary | ICD-10-CM | POA: Diagnosis present

## 2019-08-12 DIAGNOSIS — E785 Hyperlipidemia, unspecified: Secondary | ICD-10-CM | POA: Diagnosis present

## 2019-08-12 DIAGNOSIS — Z79899 Other long term (current) drug therapy: Secondary | ICD-10-CM

## 2019-08-12 DIAGNOSIS — K589 Irritable bowel syndrome without diarrhea: Secondary | ICD-10-CM | POA: Diagnosis present

## 2019-08-12 DIAGNOSIS — I349 Nonrheumatic mitral valve disorder, unspecified: Secondary | ICD-10-CM | POA: Diagnosis not present

## 2019-08-12 DIAGNOSIS — Z23 Encounter for immunization: Secondary | ICD-10-CM | POA: Diagnosis not present

## 2019-08-12 DIAGNOSIS — N368 Other specified disorders of urethra: Secondary | ICD-10-CM | POA: Diagnosis not present

## 2019-08-12 DIAGNOSIS — Z853 Personal history of malignant neoplasm of breast: Secondary | ICD-10-CM

## 2019-08-12 DIAGNOSIS — I10 Essential (primary) hypertension: Secondary | ICD-10-CM | POA: Diagnosis not present

## 2019-08-12 DIAGNOSIS — Z885 Allergy status to narcotic agent status: Secondary | ICD-10-CM

## 2019-08-12 DIAGNOSIS — K567 Ileus, unspecified: Secondary | ICD-10-CM | POA: Diagnosis not present

## 2019-08-12 DIAGNOSIS — Z79890 Hormone replacement therapy (postmenopausal): Secondary | ICD-10-CM

## 2019-08-12 DIAGNOSIS — Z8249 Family history of ischemic heart disease and other diseases of the circulatory system: Secondary | ICD-10-CM

## 2019-08-12 DIAGNOSIS — R109 Unspecified abdominal pain: Secondary | ICD-10-CM

## 2019-08-12 DIAGNOSIS — I110 Hypertensive heart disease with heart failure: Secondary | ICD-10-CM | POA: Diagnosis not present

## 2019-08-12 DIAGNOSIS — Z880 Allergy status to penicillin: Secondary | ICD-10-CM

## 2019-08-12 HISTORY — PX: NEPHROLITHOTOMY: SHX5134

## 2019-08-12 HISTORY — PX: IR URETERAL STENT LEFT NEW ACCESS W/O SEP NEPHROSTOMY CATH: IMG6075

## 2019-08-12 LAB — TYPE AND SCREEN
ABO/RH(D): O POS
Antibody Screen: NEGATIVE

## 2019-08-12 LAB — PROTIME-INR
INR: 1 (ref 0.8–1.2)
Prothrombin Time: 13.1 seconds (ref 11.4–15.2)

## 2019-08-12 LAB — APTT: aPTT: 27 seconds (ref 24–36)

## 2019-08-12 SURGERY — NEPHROLITHOTOMY PERCUTANEOUS
Anesthesia: General | Site: Back | Laterality: Left

## 2019-08-12 MED ORDER — LACTATED RINGERS IV SOLN
INTRAVENOUS | Status: DC
  Administered 2019-08-12: 09:00:00 via INTRAVENOUS

## 2019-08-12 MED ORDER — PROMETHAZINE HCL 25 MG/ML IJ SOLN: 6.2500 mg | Freq: Once | INTRAMUSCULAR | Status: DC

## 2019-08-12 MED ORDER — LEVOTHYROXINE SODIUM 88 MCG PO TABS
88.0000 ug | ORAL_TABLET | Freq: Every day | ORAL | Status: DC
  Administered 2019-08-13 – 2019-08-16 (×4): 88 ug via ORAL
  Filled 2019-08-12 (×4): qty 1

## 2019-08-12 MED ORDER — LISINOPRIL 10 MG PO TABS: 10.0000 mg | ORAL_TABLET | Freq: Every day | ORAL | Status: DC

## 2019-08-12 MED ORDER — CHLORHEXIDINE GLUCONATE CLOTH 2 % EX PADS
6.0000 | MEDICATED_PAD | Freq: Every day | CUTANEOUS | Status: DC
  Administered 2019-08-12: 6 via TOPICAL

## 2019-08-12 MED ORDER — FENTANYL CITRATE (PF) 100 MCG/2ML IJ SOLN
INTRAMUSCULAR | Status: AC
  Filled 2019-08-12: qty 2

## 2019-08-12 MED ORDER — MIDAZOLAM HCL 2 MG/2ML IJ SOLN
INTRAMUSCULAR | Status: AC
  Filled 2019-08-12: qty 4

## 2019-08-12 MED ORDER — CIPROFLOXACIN IN D5W 400 MG/200ML IV SOLN
400.0000 mg | INTRAVENOUS | Status: AC
  Administered 2019-08-12: 400 mg via INTRAVENOUS
  Filled 2019-08-12: qty 200

## 2019-08-12 MED ORDER — ONDANSETRON HCL 4 MG/2ML IJ SOLN
4.0000 mg | INTRAMUSCULAR | Status: DC | PRN
  Administered 2019-08-13: 4 mg via INTRAVENOUS
  Filled 2019-08-12: qty 2

## 2019-08-12 MED ORDER — PROPOFOL 10 MG/ML IV BOLUS
INTRAVENOUS | Status: DC | PRN
  Administered 2019-08-12: 100 mg via INTRAVENOUS

## 2019-08-12 MED ORDER — LIDOCAINE HCL (PF) 1 % IJ SOLN
INTRAMUSCULAR | Status: AC | PRN
  Administered 2019-08-12: 5 mL

## 2019-08-12 MED ORDER — ATORVASTATIN CALCIUM 10 MG PO TABS
10.0000 mg | ORAL_TABLET | Freq: Every day | ORAL | Status: DC
  Administered 2019-08-12 – 2019-08-15 (×2): 10 mg via ORAL
  Filled 2019-08-12 (×3): qty 1

## 2019-08-12 MED ORDER — LIDOCAINE 2% (20 MG/ML) 5 ML SYRINGE
INTRAMUSCULAR | Status: DC | PRN
  Administered 2019-08-12: 60 mg via INTRAVENOUS

## 2019-08-12 MED ORDER — FLUTICASONE PROPIONATE 50 MCG/ACT NA SUSP
2.0000 | Freq: Every day | NASAL | Status: DC
  Filled 2019-08-12: qty 16

## 2019-08-12 MED ORDER — ACETAMINOPHEN 325 MG PO TABS
650.0000 mg | ORAL_TABLET | ORAL | Status: DC | PRN
  Administered 2019-08-13: 650 mg via ORAL
  Filled 2019-08-12: qty 2

## 2019-08-12 MED ORDER — LETROZOLE 2.5 MG PO TABS
2.5000 mg | ORAL_TABLET | Freq: Every day | ORAL | Status: DC
  Administered 2019-08-12 – 2019-08-15 (×2): 2.5 mg via ORAL
  Filled 2019-08-12 (×4): qty 1

## 2019-08-12 MED ORDER — DEXAMETHASONE SODIUM PHOSPHATE 10 MG/ML IJ SOLN
INTRAMUSCULAR | Status: DC | PRN
  Administered 2019-08-12: 8 mg via INTRAVENOUS

## 2019-08-12 MED ORDER — BUPROPION HCL ER (XL) 300 MG PO TB24
300.0000 mg | ORAL_TABLET | Freq: Every day | ORAL | Status: DC
  Administered 2019-08-16: 300 mg via ORAL
  Filled 2019-08-12 (×2): qty 1

## 2019-08-12 MED ORDER — ONDANSETRON HCL 4 MG/2ML IJ SOLN
INTRAMUSCULAR | Status: DC | PRN
  Administered 2019-08-12: 4 mg via INTRAVENOUS

## 2019-08-12 MED ORDER — SULFAMETHOXAZOLE-TRIMETHOPRIM 800-160 MG PO TABS
1.0000 | ORAL_TABLET | Freq: Two times a day (BID) | ORAL | Status: DC
  Filled 2019-08-12 (×2): qty 1

## 2019-08-12 MED ORDER — ONDANSETRON HCL 4 MG/2ML IJ SOLN
4.0000 mg | Freq: Once | INTRAMUSCULAR | Status: AC | PRN
  Administered 2019-08-12: 14:00:00 4 mg via INTRAVENOUS

## 2019-08-12 MED ORDER — BISACODYL 10 MG RE SUPP
10.0000 mg | Freq: Every day | RECTAL | Status: DC | PRN
  Administered 2019-08-12 – 2019-08-15 (×2): 10 mg via RECTAL
  Filled 2019-08-12 (×3): qty 1

## 2019-08-12 MED ORDER — SODIUM CHLORIDE 0.45 % IV SOLN
INTRAVENOUS | Status: DC
  Administered 2019-08-12 – 2019-08-16 (×6): via INTRAVENOUS

## 2019-08-12 MED ORDER — ADULT MULTIVITAMIN W/MINERALS CH
1.0000 | ORAL_TABLET | Freq: Every day | ORAL | Status: DC
  Filled 2019-08-12 (×2): qty 1

## 2019-08-12 MED ORDER — GLYCOPYRROLATE PF 0.2 MG/ML IJ SOSY
PREFILLED_SYRINGE | INTRAMUSCULAR | Status: AC
  Filled 2019-08-12: qty 1

## 2019-08-12 MED ORDER — INFLUENZA VAC A&B SA ADJ QUAD 0.5 ML IM PRSY
0.5000 mL | PREFILLED_SYRINGE | INTRAMUSCULAR | Status: AC
  Administered 2019-08-13: 0.5 mL via INTRAMUSCULAR
  Filled 2019-08-12: qty 0.5

## 2019-08-12 MED ORDER — SENNA 8.6 MG PO TABS
1.0000 | ORAL_TABLET | Freq: Every day | ORAL | Status: DC | PRN
  Administered 2019-08-13: 8.6 mg via ORAL
  Filled 2019-08-12 (×3): qty 1

## 2019-08-12 MED ORDER — FENTANYL CITRATE (PF) 100 MCG/2ML IJ SOLN
INTRAMUSCULAR | Status: DC | PRN
  Administered 2019-08-12: 25 ug via INTRAVENOUS
  Administered 2019-08-12: 50 ug via INTRAVENOUS
  Administered 2019-08-12: 25 ug via INTRAVENOUS

## 2019-08-12 MED ORDER — ONDANSETRON HCL 4 MG/2ML IJ SOLN
INTRAMUSCULAR | Status: AC
  Filled 2019-08-12: qty 2

## 2019-08-12 MED ORDER — ROCURONIUM BROMIDE 10 MG/ML (PF) SYRINGE
PREFILLED_SYRINGE | INTRAVENOUS | Status: DC | PRN
  Administered 2019-08-12: 50 mg via INTRAVENOUS
  Administered 2019-08-12: 10 mg via INTRAVENOUS

## 2019-08-12 MED ORDER — FENTANYL CITRATE (PF) 100 MCG/2ML IJ SOLN
INTRAMUSCULAR | Status: AC | PRN
  Administered 2019-08-12 (×3): 50 ug via INTRAVENOUS

## 2019-08-12 MED ORDER — GLYCOPYRROLATE PF 0.2 MG/ML IJ SOSY
PREFILLED_SYRINGE | INTRAMUSCULAR | Status: DC | PRN
  Administered 2019-08-12: .2 mg via INTRAVENOUS

## 2019-08-12 MED ORDER — MIDAZOLAM HCL 2 MG/2ML IJ SOLN
INTRAMUSCULAR | Status: AC | PRN
  Administered 2019-08-12 (×4): 1 mg via INTRAVENOUS

## 2019-08-12 MED ORDER — METOPROLOL TARTRATE 25 MG PO TABS
12.5000 mg | ORAL_TABLET | Freq: Two times a day (BID) | ORAL | Status: DC
  Administered 2019-08-12 – 2019-08-16 (×5): 12.5 mg via ORAL
  Filled 2019-08-12 (×6): qty 1

## 2019-08-12 MED ORDER — OXYBUTYNIN CHLORIDE 5 MG PO TABS
5.0000 mg | ORAL_TABLET | Freq: Three times a day (TID) | ORAL | Status: DC | PRN
  Administered 2019-08-12 – 2019-08-14 (×3): 5 mg via ORAL
  Filled 2019-08-12 (×3): qty 1

## 2019-08-12 MED ORDER — FENTANYL CITRATE (PF) 100 MCG/2ML IJ SOLN
25.0000 ug | INTRAMUSCULAR | Status: DC | PRN
  Administered 2019-08-12: 50 ug via INTRAVENOUS

## 2019-08-12 MED ORDER — MUPIROCIN 2 % EX OINT
1.0000 "application " | TOPICAL_OINTMENT | Freq: Two times a day (BID) | CUTANEOUS | Status: DC
  Filled 2019-08-12: qty 22

## 2019-08-12 MED ORDER — ZOLPIDEM TARTRATE 5 MG PO TABS: 2.5000 mg | ORAL_TABLET | Freq: Every evening | ORAL | Status: DC | PRN

## 2019-08-12 MED ORDER — LIDOCAINE HCL 1 % IJ SOLN
INTRAMUSCULAR | Status: AC
  Filled 2019-08-12: qty 20

## 2019-08-12 MED ORDER — SUGAMMADEX SODIUM 200 MG/2ML IV SOLN
INTRAVENOUS | Status: DC | PRN
  Administered 2019-08-12: 190 mg via INTRAVENOUS

## 2019-08-12 MED ORDER — IOHEXOL 300 MG/ML  SOLN
INTRAMUSCULAR | Status: DC | PRN
  Administered 2019-08-12: 13:00:00 25 mL

## 2019-08-12 MED ORDER — IOHEXOL 300 MG/ML  SOLN
50.0000 mL | Freq: Once | INTRAMUSCULAR | Status: AC | PRN
  Administered 2019-08-12: 12 mL

## 2019-08-12 MED ORDER — OXYCODONE HCL 5 MG PO TABS
5.0000 mg | ORAL_TABLET | ORAL | Status: DC | PRN
  Administered 2019-08-13 (×2): 5 mg via ORAL
  Filled 2019-08-12 (×3): qty 1

## 2019-08-12 MED ORDER — MEPERIDINE HCL 50 MG/ML IJ SOLN: 6.2500 mg | INTRAMUSCULAR | Status: DC | PRN

## 2019-08-12 MED ORDER — SODIUM CHLORIDE 0.9 % IR SOLN
Status: DC | PRN
  Administered 2019-08-12 (×3): 6000 mL

## 2019-08-12 SURGICAL SUPPLY — 51 items
APL SKNCLS STERI-STRIP NONHPOA (GAUZE/BANDAGES/DRESSINGS) ×2
APPLICATOR SURGIFLO ENDO (HEMOSTASIS) ×2 IMPLANT
BAG URINE DRAINAGE (UROLOGICAL SUPPLIES) IMPLANT
BASKET STONE NITINOL 3FRX115MB (UROLOGICAL SUPPLIES) ×4 IMPLANT
BASKET ZERO TIP NITINOL 2.4FR (BASKET) ×4 IMPLANT
BENZOIN TINCTURE PRP APPL 2/3 (GAUZE/BANDAGES/DRESSINGS) ×8 IMPLANT
BLADE SURG 15 STRL LF DISP TIS (BLADE) ×2 IMPLANT
BLADE SURG 15 STRL SS (BLADE) ×2
CATH FOLEY 2W COUNCIL 20FR 5CC (CATHETERS) IMPLANT
CATH ROBINSON RED A/P 20FR (CATHETERS) IMPLANT
CATH X-FORCE N30 NEPHROSTOMY (TUBING) ×4 IMPLANT
COVER SURGICAL LIGHT HANDLE (MISCELLANEOUS) ×4 IMPLANT
COVER WAND RF STERILE (DRAPES) IMPLANT
DRAPE C-ARM 42X120 X-RAY (DRAPES) ×4 IMPLANT
DRAPE LINGEMAN PERC (DRAPES) ×4 IMPLANT
DRAPE SURG IRRIG POUCH 19X23 (DRAPES) ×4 IMPLANT
DRSG PAD ABDOMINAL 8X10 ST (GAUZE/BANDAGES/DRESSINGS) IMPLANT
DRSG TEGADERM 4X4.75 (GAUZE/BANDAGES/DRESSINGS) ×2 IMPLANT
DRSG TEGADERM 8X12 (GAUZE/BANDAGES/DRESSINGS) ×8 IMPLANT
EXTRACTOR STONE 1.7FRX115CM (UROLOGICAL SUPPLIES) ×4 IMPLANT
FIBER LASER FLEXIVA 1000 (UROLOGICAL SUPPLIES) IMPLANT
FIBER LASER FLEXIVA 365 (UROLOGICAL SUPPLIES) IMPLANT
FIBER LASER FLEXIVA 550 (UROLOGICAL SUPPLIES) IMPLANT
FIBER LASER TRAC TIP (UROLOGICAL SUPPLIES) IMPLANT
GAUZE SPONGE 4X4 12PLY STRL (GAUZE/BANDAGES/DRESSINGS) ×2 IMPLANT
GLOVE BIOGEL M 8.0 STRL (GLOVE) ×4 IMPLANT
GOWN STRL REUS W/TWL XL LVL3 (GOWN DISPOSABLE) ×4 IMPLANT
GUIDEWIRE AMPLAZ .035X145 (WIRE) ×8 IMPLANT
GUIDEWIRE STR DUAL SENSOR (WIRE) ×2 IMPLANT
KIT BASIN OR (CUSTOM PROCEDURE TRAY) ×4 IMPLANT
KIT PROBE 340X3.4XDISP GRN (MISCELLANEOUS) IMPLANT
KIT PROBE TRILOGY 3.4X340 (MISCELLANEOUS)
KIT PROBE TRILOGY 3.9X350 (MISCELLANEOUS) ×2 IMPLANT
KIT TURNOVER KIT A (KITS) IMPLANT
MANIFOLD NEPTUNE II (INSTRUMENTS) ×4 IMPLANT
NS IRRIG 1000ML POUR BTL (IV SOLUTION) ×4 IMPLANT
PACK CYSTO (CUSTOM PROCEDURE TRAY) ×4 IMPLANT
SET IRRIG Y TYPE TUR BLADDER L (SET/KITS/TRAYS/PACK) IMPLANT
SHEATH PEELAWAY SET 9 (SHEATH) ×4 IMPLANT
SPONGE LAP 4X18 RFD (DISPOSABLE) ×4 IMPLANT
STENT URET 6FRX24 CONTOUR (STENTS) ×2 IMPLANT
SURGIFLO W/THROMBIN 8M KIT (HEMOSTASIS) ×2 IMPLANT
SUT SILK 2 0 30  PSL (SUTURE) ×2
SUT SILK 2 0 30 PSL (SUTURE) ×2 IMPLANT
SYR 10ML LL (SYRINGE) ×4 IMPLANT
SYR 20ML LL LF (SYRINGE) ×8 IMPLANT
TRAY FOLEY MTR SLVR 14FR STAT (SET/KITS/TRAYS/PACK) ×2 IMPLANT
TUBING CONNECTING 10 (TUBING) ×7 IMPLANT
TUBING CONNECTING 10' (TUBING) ×3
TUBING UROLOGY SET (TUBING) ×4 IMPLANT
WATER STERILE IRR 1000ML POUR (IV SOLUTION) ×4 IMPLANT

## 2019-08-12 NOTE — Transfer of Care (Signed)
Immediate Anesthesia Transfer of Care Note  Patient: [name]  Procedure(s) Performed: Procedure(s) with comments: NEPHROLITHOTOMY PERCUTANEOUS/INSERTION DOUBLE J STENT (Left) - 2 HRS  Patient Location: PACU  Anesthesia Type:General  Level of Consciousness:  sedated, patient cooperative and responds to stimulation  Airway & Oxygen Therapy:Patient Spontanous Breathing and Patient connected to face mask oxgen  Post-op Assessment:  Report given to PACU RN and Post -op Vital signs reviewed and stable  Post vital signs:  Reviewed and stable  Last Vitals:  Vitals:   08/12/19 1103  BP: 132/69  Pulse: (!) 49  Resp: 13  SpO2: [id number]    Complications: No apparent anesthesia complications

## 2019-08-12 NOTE — Interval H&P Note (Signed)
History and Physical Interval Note:  08/12/2019 9:29 AM  [name]  has presented today for surgery, with the diagnosis of LEFT RENAL CALCULI.  The various methods of treatment have been discussed with the patient and family. After consideration of risks, benefits and other options for treatment, the patient has consented to  Procedure(s) with comments: NEPHROLITHOTOMY PERCUTANEOUS (Left) - 2 HRS HOLMIUM LASER APPLICATION (Left) as a surgical intervention.  The patient's history has been reviewed, patient examined, no change in status, stable for surgery.  I have reviewed the patient's chart and labs.  Questions were answered to the patient's satisfaction.     [name]

## 2019-08-12 NOTE — Op Note (Signed)
Preoperative diagnosis: Renal calculi, greatest diameter 18 mm  Postoperative diagnosis: Same  Principal procedure: Right percutaneous nephrolithotomy, under 20 mm in greatest diameter, placement of 6 French by 24 cm contour double-J stent without tether, use of fluoroscopy  Surgeon: Chamberlain Steinborn  Anesthesia: General endotracheal  Specimen: Stones  Estimated blood loss: Less than 100 mL  Drains 24 cm a 6 French contour double-J stent and right ureter  Indications: 73 year old female with symptomatic left renal calculi.  These were diagnosed earlier in the year.  Because of the COVID situation, she requested to delay management of the stones to the present time.  I discussed treatment options including percutaneous nephrolithotomy, ureteroscopy and shockwave lithotripsy.  Because of the large size of these calculi, I recommended that we proceed with percutaneous management.  She agrees with this procedure, as well as risks and complications involved including bleeding, loss of her kidney, inadequate stone removal, the need for stent, anesthetic complications, infections.  She understands these and desires to proceed.  Findings: 3 stones were at the ureteropelvic junction.  Earlier at her follow-up visits they were within the renal pelvis.  There were 3 stones altogether, no other stones were noted in inspection of the pyelocalyceal system.  There was significant inflammatory response at the UPJ.  Description of procedure: The patient had a had her left percutaneous nephrostomy tube placed with the Kumpe catheter by Dr. Amelia Jo.  She was then identified and marked in the holding area.  She was then taken to the operating room where general anesthetic was administered.  She was first placed in the supine position and her bladder was drained with a Foley catheter.  She was then positioned in the prone position, all extremities and pressure points were adequately padded.  Her left flank was prepped and  draped.  Timeout was then performed.  I passed a Super Stiff guidewire down the Kumpe catheter with the the distal tip seen within the bladder fluoroscopically.  The Kumpe catheter was removed.  Small incision was made lateral to the Super Stiff wire, and the 10 French peel-away access catheter was placed.  The obturator was removed and a second working wire was placed fluoroscopically into the bladder.  Over top of the working guidewire, I passed the NephroMax balloon.  This was inflated to 20 atm of pressure and the nephrostomy access sheath was then passed fluoroscopically into the renal pelvis near the stones.  The balloon was deflated.  It was then removed.  The rigid nephroscope was then passed.  Unfortunately, because of the angle of entry into the pelvis, and the lower position of the stones at the UPJ, I could not guide the rigid nephroscope to the stones.  The flexible nephroscope was then passed.  Individually, I then grasped the stones and brought him into the renal pelvis.  They were then fragmented with the trilogy device.  Fragments were extracted.  The one smaller stone was able to be extracted without fragmentation.  Careful inspection of all calyces following lithotripsy and stone removal revealed no further stone material present.  I then passed a 24 cm x 6 French double-J stent fluoroscopically with the distal tip residing in the bladder fluoroscopically.  After adequate positioning, the guidewire was removed and the stent was adequately deployed within the ureter with a curl seen within the renal pelvis.  Following this, the distance between the edge of the calyx and the skin was measured.  FloSeal was then placed in the nephrostomy tract using a trocar  after removal of the access sheath.  There was adequate hemostasis.  Skin edges were reapproximated using 2-0 silk placed in a vertical mattress fashion.  Dry sterile dressing was then placed.  The patient was then returned to the supine  position, extubated, and taken to the PACU in stable condition.  She tolerated the procedure well.  Stone fragments were sent with her to the PACU at her request.

## 2019-08-12 NOTE — Procedures (Signed)
Interventional Radiology Procedure Note  Procedure: LEFT nephroureteral access for PCNL.   Complications: None  Estimated Blood Loss: None  Recommendations: - To OR for PCNL  Signed,  Criselda Peaches, MD

## 2019-08-12 NOTE — Discharge Instructions (Signed)

## 2019-08-12 NOTE — Progress Notes (Signed)
Post-op note  Subjective: The patient is doing well.  No complaints.  Objective: Vital signs in last 24 hours: Temp:  [96.4 F (35.8 C)-98.6 F (37 C)] 98.6 F (37 C) (09/21 2105) Pulse Rate:  [49-66] 58 (09/21 2105) Resp:  [8-20] 16 (09/21 2105) BP: (105-160)/(67-86) 144/86 (09/21 2105) SpO2:  [98 %-100 %] 100 % (09/21 2105) Weight:  [60.8 kg-62.3 kg] 62.3 kg (09/21 1655)  Intake/Output from previous day: No intake/output data recorded. Intake/Output this shift: No intake/output data recorded.  Physical Exam:  General: Alert and oriented. Abdomen: Soft, Nondistended. Dressing dry.  Lab Results: No results for input(s): HGB, HCT in the last 72 hours.  Assessment/Plan: POD#0   Doing well. Will check H/H in am, probable d/c. Ultram sent to pharmacy already. She has about 2 more days of Bactrim to take post d/c.   Lillette Boxer. Lynnmarie Lovett, MD   LOS: 0 days   Lillette Boxer Kambree Krauss 08/12/2019, 9:10 PM     Post-op note  Subjective: The patient is doing well.  No complaints.  Objective: Vital signs in last 24 hours: Temp:  [96.4 F (35.8 C)-98.6 F (37 C)] 98.6 F (37 C) (09/21 2105) Pulse Rate:  [49-66] 58 (09/21 2105) Resp:  [8-20] 16 (09/21 2105) BP: (105-160)/(67-86) 144/86 (09/21 2105) SpO2:  [98 %-100 %] 100 % (09/21 2105) Weight:  [60.8 kg-62.3 kg] 62.3 kg (09/21 1655)  Intake/Output from previous day: No intake/output data recorded. Intake/Output this shift: No intake/output data recorded.  Physical Exam:  General: Alert and oriented. Abdomen: Soft, Nondistended. Incisions: Clean and dry.  Lab Results: No results for input(s): HGB, HCT in the last 72 hours.  Assessment/Plan: POD#0   1) Continue to monitor   Lillette Boxer. Fed Ceci, MD   LOS: 0 days   Lillette Boxer Omkar Stratmann 08/12/2019, 9:10 PM

## 2019-08-12 NOTE — Anesthesia Procedure Notes (Signed)
Procedure Name: Intubation Date/Time: 08/12/2019 12:03 PM Performed by: Lavina Hamman, CRNA Pre-anesthesia Checklist: Patient identified, Emergency Drugs available, Suction available, Patient being monitored and Timeout performed Patient Re-evaluated:Patient Re-evaluated prior to induction Oxygen Delivery Method: Circle system utilized Preoxygenation: Pre-oxygenation with 100% oxygen Induction Type: IV induction Ventilation: Mask ventilation without difficulty Laryngoscope Size: Mac and 3 Grade View: Grade II Tube type: Oral Tube size: 7.0 mm Number of attempts: 2 Airway Equipment and Method: Stylet Placement Confirmation: ETT inserted through vocal cords under direct vision,  positive ETCO2,  CO2 detector and breath sounds checked- equal and bilateral Secured at: 21 cm Tube secured with: Tape Dental Injury: Teeth and Oropharynx as per pre-operative assessment  Difficulty Due To: Difficult Airway- due to anterior larynx, Difficult Airway- due to dentition and Difficult Airway- due to limited oral opening Comments: One DL by EMS Student, then by CRNA, ATOI.

## 2019-08-12 NOTE — H&P (Signed)
H&P  Chief Complaint: Kidney stones  History of Present Illness:  73 year old female presents at this time for percutaneous management of several large left renal pelvic calculi.  Past Medical History:  Diagnosis Date  . Adenomatous colon polyp 03/07/2012  . Allergic rhinitis   . Arthritis    "knees" (08/28/2018)  . Breast cancer, right breast ([facility]) 05/20/13   "no chemo" (08/28/2018)  . Chronic lower back pain   . Diverticulosis   . DOE (dyspnea on exertion)   . Graves' disease    2011  . Heart palpitations   . History of kidney stones   . HLD (hyperlipidemia)   . Hx of radiation therapy 08/13/13- 09/06/13   right breast 4250 cGy 17 sessions  . Hypertension   . IBS (irritable bowel syndrome)   . Lumbar herniated disc    L4-L5  . MVP (mitral valve prolapse)   . Osteopenia   . Personal history of radiation therapy   . Pneumonia 08/2019  . PONV (postoperative nausea and vomiting)    "when I have general anesthetic" (08/28/2018)  . Pulmonary emboli (HCC)    after knee replacement  . PVC (premature ventricular contraction)    Takes metoprolol PRN for  pvc's    Past Surgical History:  Procedure Laterality Date  . BREAST BIOPSY Right 2014  . BREAST LUMPECTOMY WITH NEEDLE LOCALIZATION AND AXILLARY SENTINEL LYMPH NODE BX Right 06/11/2013   Procedure: BREAST LUMPECTOMY WITH NEEDLE LOCALIZATION AND AXILLARY SENTINEL LYMPH NODE BX;  Surgeon: Harl Bowie, MD;  Location: [place];  Service: General;  Laterality: Right;  Needle localization BCG 7:30  . COLONOSCOPY W/ BIOPSIES AND POLYPECTOMY  multiple  . KNEE ARTHROSCOPY Left 2006  . TOTAL KNEE ARTHROPLASTY Right 08/28/2018   Procedure: RIGHT TOTAL KNEE ARTHROPLASTY;  Surgeon: Garald Balding, MD;  Location: [place];  Service: Orthopedics;  Laterality: Right;    Home Medications:    Allergies:  Allergies  Allergen Reactions  . Dilaudid [Hydromorphone Hcl] Nausea And Vomiting  . Terak [Terramycin] Rash  . Amoxicillin Diarrhea     Has patient had a PCN reaction causing immediate rash, facial/tongue/throat swelling, SOB or lightheadedness with hypotension: No Has patient had a PCN reaction causing severe rash involving mucus membranes or skin necrosis: No Has patient had a PCN reaction that required hospitalization: No Has patient had a PCN reaction occurring within the last 10 years: Unknown If all of the above answers are "NO", then may proceed with Cephalosporin use.     Family History  Problem Relation Age of Onset  . Heart disease Mother 39       MI  . Lung cancer Mother   . Hypertension Mother   . Alcohol abuse Father   . COPD Father   . Colon cancer Maternal Grandfather 18  . Diabetes Paternal Grandmother   . Breast cancer Maternal Aunt   . Pancreatic cancer Maternal Aunt   . Stomach cancer Neg Hx     Social History:  reports that she has never smoked. She has never used smokeless tobacco. She reports current alcohol use. She reports that she does not use drugs.  ROS: A complete review of systems was performed.  All systems are negative except for pertinent findings as noted.  Physical Exam:  Vital signs in last 24 hours: Temp:  [97.7 F (36.5 C)] 97.7 F (36.5 C) (09/21 0809) Pulse Rate:  [50] 50 (09/21 0809) Resp:  [20] 20 (09/21 0809) BP: (151)/(82) 151/82 (09/21 0809) SpO2:  [  98 %] 98 % (09/21 0809) Weight:  [60.8 kg] 60.8 kg (09/21 0826) Constitutional:  Alert and oriented, No acute distress Cardiovascular: Regular rate  Respiratory: Normal respiratory effort GI: Abdomen is soft, nontender, nondistended, no abdominal masses. No CVAT.  Genitourinary: Normal female phallus, testes are descended bilaterally and non-tender and without masses, scrotum is normal in appearance without lesions or masses, perineum is normal on inspection. Lymphatic: No lymphadenopathy Neurologic: Grossly intact, no focal deficits Psychiatric: Normal mood and affect  Laboratory Data:  No results for input(s):  WBC, HGB, HCT, PLT in the last 72 hours.  No results for input(s): NA, K, CL, GLUCOSE, BUN, CALCIUM, CREATININE in the last 72 hours.  Invalid input(s): CO3   No results found for this or any previous visit (from the past 24 hour(s)). Recent Results (from the past 240 hour(s))  Novel Coronavirus, NAA (Hosp order, Send-out to Ref Lab; TAT 18-24 hrs     Status: None   Collection Time: 08/08/19  2:13 PM   Specimen: Nasopharyngeal Swab; Respiratory  Result Value Ref Range Status   SARS-CoV-2, NAA NOT DETECTED NOT DETECTED Final    Comment: (NOTE) This nucleic acid amplification test was developed and its performance characteristics determined by [employer]. Nucleic acid amplification tests include PCR and TMA. This test has not been FDA cleared or approved. This test has been authorized by FDA under an Emergency Use Authorization (EUA). This test is only authorized for the duration of time the declaration that circumstances exist justifying the authorization of the emergency use of in vitro diagnostic tests for detection of SARS-CoV-2 virus and/or diagnosis of COVID-19 infection under section 564(b)(1) of the Act, 21 U.S.C. [id number]) (1), unless the authorization is terminated or revoked sooner. When diagnostic testing is negative, the possibility of a false negative result should be considered in the context of a patient's recent exposures and the presence of clinical signs and symptoms consistent with COVID-19. An individual without symptoms of COVID- 19 and who is not shedding SARS-CoV-2 vi rus would expect to have a negative (not detected) result in this assay. Performed At: [facility] [address] [place], [place] [id number] Rush Farmer MD [id number]    [place]  Final    Comment: Performed at [facility], [place] [address] [place], [place] [address]    Renal Function: Recent Labs    08/07/19 1354  CREATININE  0.90   Estimated Creatinine Clearance: 50.8 mL/min (by C-G formula based on SCr of 0.9 mg/dL).  Radiologic Imaging: No results found.  Impression/Assessment:    Multiple left renal calculi  Plan:   left percutaneous nephrolithotomy

## 2019-08-12 NOTE — Consult Note (Signed)
Chief Complaint: Patient was seen in consultation today for left percutaneous nephrostomy/nephroureteral catheter placement   Referring Physician(s): Dahlstedt,S  Supervising Physician: Jacqulynn Cadet  Patient Status: [facility] - Out[facility]  TBA  History of Present Illness: [name] is a 73 y.o. female with remote history of breast cancer, prior PE in 2019 on Xarelto, intermittent hematuria/left flank discomfort and left renal pelvic stones along with mild left hydronephrosis noted on CT from April of this year.  She presents today for left percutaneous nephrostomy/nephroureteral catheter placement prior to nephrolithotomy.  Additional medical history as below.   Past Medical History:  Diagnosis Date  . Adenomatous colon polyp 03/07/2012  . Allergic rhinitis   . Arthritis    "knees" (08/28/2018)  . Breast cancer, right breast ([place]) 05/20/13   "no chemo" (08/28/2018)  . Chronic lower back pain   . Diverticulosis   . DOE (dyspnea on exertion)   . Graves' disease    2011  . Heart palpitations   . History of kidney stones   . HLD (hyperlipidemia)   . Hx of radiation therapy 08/13/13- 09/06/13   right breast 4250 cGy 17 sessions  . Hypertension   . IBS (irritable bowel syndrome)   . Lumbar herniated disc    L4-L5  . MVP (mitral valve prolapse)   . Osteopenia   . Personal history of radiation therapy   . Pneumonia 08/2019  . PONV (postoperative nausea and vomiting)    "when I have general anesthetic" (08/28/2018)  . Pulmonary emboli (HCC)    after knee replacement  . PVC (premature ventricular contraction)    Takes metoprolol PRN for  pvc's    Past Surgical History:  Procedure Laterality Date  . BREAST BIOPSY Right 2014  . BREAST LUMPECTOMY WITH NEEDLE LOCALIZATION AND AXILLARY SENTINEL LYMPH NODE BX Right 06/11/2013   Procedure: BREAST LUMPECTOMY WITH NEEDLE LOCALIZATION AND AXILLARY SENTINEL LYMPH NODE BX;  Surgeon: Harl Bowie, MD;  Location: [place];   Service: General;  Laterality: Right;  Needle localization BCG 7:30  . COLONOSCOPY W/ BIOPSIES AND POLYPECTOMY  multiple  . KNEE ARTHROSCOPY Left 2006  . TOTAL KNEE ARTHROPLASTY Right 08/28/2018   Procedure: RIGHT TOTAL KNEE ARTHROPLASTY;  Surgeon: Garald Balding, MD;  Location: [place];  Service: Orthopedics;  Laterality: Right;    Allergies: Dilaudid [hydromorphone hcl], Terak [terramycin], and Amoxicillin  Medications: Prior to Admission medications   Medication Sig Start Date End Date Taking? Authorizing Provider  atorvastatin (LIPITOR) 10 MG tablet Take 10 mg by mouth at bedtime.    Yes [provider]  buPROPion (WELLBUTRIN XL) 300 MG 24 hr tablet Take 300 mg by mouth daily.   Yes [provider]  Calcium-Phosphorus-Vitamin D (CITRACAL +D3 PO) Take 1 tablet by mouth at bedtime.   Yes [provider]  CANNABIDIOL PO Place 1 Dose under the tongue at bedtime. CBD OIL   Yes [provider]  Echinacea 450 MG CAPS Take 450 mg by mouth daily.   Yes [provider]  fluticasone (FLONASE) 50 MCG/ACT nasal spray Place 2 sprays into the nose daily.    Yes [provider]  ibuprofen (ADVIL) 200 MG tablet Take 400 mg by mouth every 8 (eight) hours as needed (pain.).   Yes [provider]  letrozole (FEMARA) 2.5 MG tablet Take 1 tablet (2.5 mg total) by mouth daily. Patient taking differently: Take 2.5 mg by mouth at bedtime.  10/15/18  Yes Nicholas Lose, MD  metoprolol tartrate (LOPRESSOR)  25 MG tablet Take 25 mg 1 hour before Cardiac CT Patient taking differently: Take 12.5 mg by mouth 2 (two) times daily.  03/20/18  Yes Barrett, Evelene Croon, PA-C  Multiple Vitamin (MULTIVITAMIN WITH MINERALS) TABS tablet Take 1 tablet by mouth daily.   Yes [provider]  Probiotic Product (PROBIOTIC DAILY PO) Take 1 capsule by mouth daily.   Yes [provider]  senna (SENOKOT) 8.6 MG tablet Take 1-2 tablets by mouth daily as needed  for constipation.   Yes [provider]  SYNTHROID 88 MCG tablet Take 88 mcg by mouth daily before breakfast.  07/04/13  Yes [provider]  tretinoin (RETIN-A) 0.025 % cream Apply 1 application topically daily.  07/17/19  Yes [provider]  zolpidem (AMBIEN) 10 MG tablet Take 2.5 mg by mouth at bedtime as needed for sleep.  03/22/19  Yes [provider]  lisinopril (ZESTRIL) 10 MG tablet TAKE 1 TABLET BY MOUTH DAILY. Patient not taking: No sig reported 05/14/19   Minus Breeding, MD     Family History  Problem Relation Age of Onset  . Heart disease Mother 51       MI  . Lung cancer Mother   . Hypertension Mother   . Alcohol abuse Father   . COPD Father   . Colon cancer Maternal Grandfather 74  . Diabetes Paternal Grandmother   . Breast cancer Maternal Aunt   . Pancreatic cancer Maternal Aunt   . Stomach cancer Neg Hx     Social History   Socioeconomic History  . Marital status: Married    Spouse name: Not on file  . Number of children: 1  . Years of education: Not on file  . Highest education level: Not on file  Occupational History  . Occupation: Programmer, multimedia: UNEMPLOYED  Social Needs  . Financial resource strain: Not on file  . Food insecurity    Worry: Not on file    Inability: Not on file  . Transportation needs    Medical: Not on file    Non-medical: Not on file  Tobacco Use  . Smoking status: Never Smoker  . Smokeless tobacco: Never Used  Substance and Sexual Activity  . Alcohol use: Yes    Alcohol/week: 0.0 standard drinks    Comment: 08/28/2018 "1-2 drinks/month"  . Drug use: Never  . Sexual activity: Not Currently    Birth control/protection: Post-menopausal    Comment: menarche 27, 51 1 age 41, menopause 9, HRT x 9 yrs  Lifestyle  . Physical activity    Days per week: Not on file    Minutes per session: Not on file  . Stress: Not on file  Relationships  . Social Herbalist on phone: Not on file     Gets together: Not on file    Attends religious service: Not on file    Active member of club or organization: Not on file    Attends meetings of clubs or organizations: Not on file    Relationship status: Not on file  Other Topics Concern  . Not on file  Social History Narrative  . Not on file      Review of Systems currently denies fever, headache, chest pain, dyspnea, cough, abdominal/back pain, nausea, vomiting or bleeding.  Vital Signs: Blood pressure 151/82, heart rate 50, temp 97.7, respirations 20, O2 sat 98% room air Ht 5\' 5"  (1.651 m)   Wt 134 lb 0.6 oz (60.8  kg)   BMI 22.31 kg/m   Physical Exam awake, alert.  Chest clear to auscultation bilaterally.  Heart with bradycardic but regular rhythm.  Abdomen soft, positive bowel sounds, nontender.  No lower extremity edema.  Imaging: No results found.  Labs:  CBC: Recent Labs    08/29/18 0424 10/03/18 1315 10/04/18 0531 08/07/19 1354  WBC 8.9 13.1* 9.6 4.4  HGB 10.5* 11.8* 10.7* 11.1*  HCT 32.8* 36.7 33.2* 37.8  PLT 167 244 261 296    COAGS: Recent Labs    08/17/18 1356 10/03/18 1054  INR 1.05 1.07  APTT 26 29    BMP: Recent Labs    08/17/18 1356 08/29/18 0424 10/03/18 1044 08/07/19 1354  NA 139 136 133* 139  K 4.0 4.6 3.9 4.6  CL 108 108 100 104  CO2 23 19* 25 29  GLUCOSE 75 73 98 87  BUN 15 8 12 16   CALCIUM 9.3 7.8* 8.8* 9.5  CREATININE 0.84 0.51 0.83 0.90  GFRNONAA >60 >60 >60 >60  GFRAA >60 >60 >60 >60    LIVER FUNCTION TESTS: Recent Labs    08/17/18 1356  BILITOT 0.7  AST 30  ALT 20  ALKPHOS 42  PROT 6.3*  ALBUMIN 4.0    TUMOR MARKERS: No results for input(s): AFPTM, CEA, CA199, CHROMGRNA in the last 8760 hours.  Assessment and Plan: 73 y.o. female with remote history of breast cancer, prior PE in 2019 on Xarelto, intermittent hematuria/left flank discomfort and left renal pelvic stones along with mild left hydronephrosis noted on CT from April of this year.  She presents  today for left percutaneous nephrostomy/nephroureteral catheter placement prior to nephrolithotomy. Risks and benefits of left PCN placement was discussed with the patient including, but not limited to, infection, bleeding, significant bleeding causing loss or decrease in renal function or damage to adjacent structures.   All of the patient's questions were answered, patient is agreeable to proceed.  Consent signed and in chart.  LABS PENDING    Thank you for this interesting consult.  I greatly enjoyed meeting EULLA FARRAJ and look forward to participating in their care.  A copy of this report was sent to the requesting provider on this date.  Electronically Signed: D. Rowe Robert, PA-C 08/12/2019, 8:51 AM   I spent a total of 25 minutes  in face to face in clinical consultation, greater than 50% of which was counseling/coordinating care for left percutaneous nephrostomy/nephroureteral catheter placement

## 2019-08-12 NOTE — Anesthesia Postprocedure Evaluation (Signed)
Anesthesia Post Note  Patient: [name]  Procedure(s) Performed: NEPHROLITHOTOMY PERCUTANEOUS/INSERTION DOUBLE J STENT (Left Back)     Patient location during evaluation: PACU Anesthesia Type: General Level of consciousness: sedated and patient cooperative Pain management: pain level controlled Vital Signs Assessment: post-procedure vital signs reviewed and stable Respiratory status: spontaneous breathing Cardiovascular status: stable Anesthetic complications: no    Last Vitals:  Vitals:   08/12/19 1522 08/12/19 1530  BP:  (!) 141/80  Pulse:  (!) 55  Resp:  10  Temp: (!) 36.4 C (!) 36.4 C  SpO2:  99%    Last Pain:  Vitals:   08/12/19 1530  PainSc: 0-No pain                 Nolon Nations

## 2019-08-13 ENCOUNTER — Encounter (HOSPITAL_COMMUNITY): Payer: Self-pay | Admitting: Urology

## 2019-08-13 DIAGNOSIS — Z23 Encounter for immunization: Secondary | ICD-10-CM | POA: Diagnosis not present

## 2019-08-13 DIAGNOSIS — M1712 Unilateral primary osteoarthritis, left knee: Secondary | ICD-10-CM | POA: Diagnosis present

## 2019-08-13 DIAGNOSIS — I110 Hypertensive heart disease with heart failure: Secondary | ICD-10-CM | POA: Diagnosis not present

## 2019-08-13 DIAGNOSIS — Z923 Personal history of irradiation: Secondary | ICD-10-CM | POA: Diagnosis not present

## 2019-08-13 DIAGNOSIS — Z86711 Personal history of pulmonary embolism: Secondary | ICD-10-CM | POA: Diagnosis not present

## 2019-08-13 DIAGNOSIS — Z466 Encounter for fitting and adjustment of urinary device: Secondary | ICD-10-CM | POA: Diagnosis not present

## 2019-08-13 DIAGNOSIS — Z560 Unemployment, unspecified: Secondary | ICD-10-CM | POA: Diagnosis not present

## 2019-08-13 DIAGNOSIS — Z885 Allergy status to narcotic agent status: Secondary | ICD-10-CM | POA: Diagnosis not present

## 2019-08-13 DIAGNOSIS — I341 Nonrheumatic mitral (valve) prolapse: Secondary | ICD-10-CM | POA: Diagnosis present

## 2019-08-13 DIAGNOSIS — I10 Essential (primary) hypertension: Secondary | ICD-10-CM | POA: Diagnosis present

## 2019-08-13 DIAGNOSIS — K589 Irritable bowel syndrome without diarrhea: Secondary | ICD-10-CM | POA: Diagnosis present

## 2019-08-13 DIAGNOSIS — M8580 Other specified disorders of bone density and structure, unspecified site: Secondary | ICD-10-CM | POA: Diagnosis present

## 2019-08-13 DIAGNOSIS — E785 Hyperlipidemia, unspecified: Secondary | ICD-10-CM | POA: Diagnosis present

## 2019-08-13 DIAGNOSIS — Z853 Personal history of malignant neoplasm of breast: Secondary | ICD-10-CM | POA: Diagnosis not present

## 2019-08-13 DIAGNOSIS — Z7901 Long term (current) use of anticoagulants: Secondary | ICD-10-CM | POA: Diagnosis not present

## 2019-08-13 DIAGNOSIS — Z8249 Family history of ischemic heart disease and other diseases of the circulatory system: Secondary | ICD-10-CM | POA: Diagnosis not present

## 2019-08-13 DIAGNOSIS — K567 Ileus, unspecified: Secondary | ICD-10-CM | POA: Diagnosis not present

## 2019-08-13 DIAGNOSIS — I5032 Chronic diastolic (congestive) heart failure: Secondary | ICD-10-CM | POA: Diagnosis not present

## 2019-08-13 DIAGNOSIS — N368 Other specified disorders of urethra: Secondary | ICD-10-CM | POA: Diagnosis not present

## 2019-08-13 DIAGNOSIS — T83122A Displacement of urinary stent, initial encounter: Secondary | ICD-10-CM | POA: Diagnosis not present

## 2019-08-13 DIAGNOSIS — Z79899 Other long term (current) drug therapy: Secondary | ICD-10-CM | POA: Diagnosis not present

## 2019-08-13 DIAGNOSIS — Z888 Allergy status to other drugs, medicaments and biological substances status: Secondary | ICD-10-CM | POA: Diagnosis not present

## 2019-08-13 DIAGNOSIS — Z79890 Hormone replacement therapy (postmenopausal): Secondary | ICD-10-CM | POA: Diagnosis not present

## 2019-08-13 DIAGNOSIS — Z87442 Personal history of urinary calculi: Secondary | ICD-10-CM | POA: Diagnosis not present

## 2019-08-13 DIAGNOSIS — N132 Hydronephrosis with renal and ureteral calculous obstruction: Secondary | ICD-10-CM | POA: Diagnosis present

## 2019-08-13 DIAGNOSIS — N200 Calculus of kidney: Secondary | ICD-10-CM | POA: Diagnosis not present

## 2019-08-13 DIAGNOSIS — Z880 Allergy status to penicillin: Secondary | ICD-10-CM | POA: Diagnosis not present

## 2019-08-13 LAB — HEMOGLOBIN AND HEMATOCRIT, BLOOD
HCT: 36.7 % (ref 36.0–46.0)
Hemoglobin: 11.1 g/dL — ABNORMAL LOW (ref 12.0–15.0)

## 2019-08-13 MED ORDER — HYDROMORPHONE HCL 1 MG/ML IJ SOLN: 0.5000 mg | INTRAMUSCULAR | Status: DC | PRN

## 2019-08-13 MED ORDER — CALCIUM CARBONATE ANTACID 500 MG PO CHEW
1.0000 | CHEWABLE_TABLET | Freq: Two times a day (BID) | ORAL | Status: DC
  Administered 2019-08-13: 09:00:00 200 mg via ORAL
  Filled 2019-08-13 (×4): qty 1

## 2019-08-13 MED ORDER — FLEET ENEMA 7-19 GM/118ML RE ENEM
1.0000 | ENEMA | Freq: Once | RECTAL | Status: AC
  Administered 2019-08-13: 1 via RECTAL
  Filled 2019-08-13: qty 1

## 2019-08-13 MED ORDER — ACETAMINOPHEN 10 MG/ML IV SOLN
1000.0000 mg | Freq: Four times a day (QID) | INTRAVENOUS | Status: AC
  Administered 2019-08-13 – 2019-08-14 (×4): 1000 mg via INTRAVENOUS
  Filled 2019-08-13 (×4): qty 100

## 2019-08-13 MED ORDER — MAGNESIUM CITRATE PO SOLN
0.5000 | Freq: Once | ORAL | Status: AC
  Administered 2019-08-13: 0.5 via ORAL
  Filled 2019-08-13 (×2): qty 296

## 2019-08-13 NOTE — Progress Notes (Signed)
1 Day Post-Op  Subjective: She is s/p left tubeless PCNL.  Foley removed this morning.  Hgb stable at 11.1.   She has increased left flank and abdominal pain overnight with nausea.  The pain is moderately severe.    ROS:  Review of Systems  Gastrointestinal: Positive for abdominal pain and nausea.  Genitourinary: Positive for flank pain.  All other systems reviewed and are negative.   Anti-infectives: Anti-infectives (From admission, onward)   Start     Dose/Rate Route Frequency Ordered Stop   08/12/19 2200  sulfamethoxazole-trimethoprim (BACTRIM DS) 800-160 MG per tablet 1 tablet     1 tablet Oral Every 12 hours 08/12/19 1654     08/12/19 0815  ciprofloxacin (CIPRO) IVPB 400 mg     400 mg 200 mL/hr over 60 Minutes Intravenous To Radiology 08/12/19 0804 08/12/19 1040      Current Facility-Administered Medications  Medication Dose Route Frequency Provider Last Rate Last Dose  . 0.45 % sodium chloride infusion   Intravenous Continuous Franchot Gallo, MD 75 mL/hr at 08/12/19 1740    . acetaminophen (OFIRMEV) IV 1,000 mg  1,000 mg Intravenous Q6H Irine Seal, MD      . acetaminophen (TYLENOL) tablet 650 mg  650 mg Oral Q4H PRN Franchot Gallo, MD   650 mg at 08/13/19 0256  . atorvastatin (LIPITOR) tablet 10 mg  10 mg Oral QHS Franchot Gallo, MD   10 mg at 08/12/19 2117  . bisacodyl (DULCOLAX) suppository 10 mg  10 mg Rectal Daily PRN Franchot Gallo, MD   10 mg at 08/12/19 2332  . buPROPion (WELLBUTRIN XL) 24 hr tablet 300 mg  300 mg Oral Daily Franchot Gallo, MD      . calcium carbonate (TUMS - dosed in mg elemental calcium) chewable tablet 200 mg of elemental calcium  1 tablet Oral BID Irine Seal, MD      . Chlorhexidine Gluconate Cloth 2 % PADS 6 each  6 each Topical Daily Franchot Gallo, MD   6 each at 08/12/19 2119  . fluticasone (FLONASE) 50 MCG/ACT nasal spray 2 spray  2 spray Each Nare Daily Dahlstedt, Annie Main, MD      . HYDROmorphone (DILAUDID) injection  0.5 mg  0.5 mg Intravenous Q2H PRN Irine Seal, MD      . influenza vaccine adjuvanted (FLUAD) injection 0.5 mL  0.5 mL Intramuscular Tomorrow-1000 Franchot Gallo, MD      . letrozole [facility]) tablet 2.5 mg  2.5 mg Oral QHS Franchot Gallo, MD   2.5 mg at 08/12/19 2117  . levothyroxine (SYNTHROID) tablet 88 mcg  88 mcg Oral QAC breakfast Franchot Gallo, MD      . metoprolol tartrate (LOPRESSOR) tablet 12.5 mg  12.5 mg Oral BID Franchot Gallo, MD   12.5 mg at 08/12/19 2118  . multivitamin with minerals tablet 1 tablet  1 tablet Oral Daily Dahlstedt, Annie Main, MD      . mupirocin ointment (BACTROBAN) 2 % 1 application  1 application Nasal BID Franchot Gallo, MD      . ondansetron [facility]) injection 4 mg  4 mg Intravenous Q4H PRN Franchot Gallo, MD   4 mg at 08/13/19 0411  . oxybutynin (DITROPAN) tablet 5 mg  5 mg Oral Q8H PRN Franchot Gallo, MD   5 mg at 08/12/19 1815  . oxyCODONE (Oxy IR/ROXICODONE) immediate release tablet 5 mg  5 mg Oral Q4H PRN Franchot Gallo, MD   5 mg at 08/13/19 [phone]  . senna (SENOKOT) tablet 8.6 mg  1  tablet Oral Daily PRN Franchot Gallo, MD   8.6 mg at 08/13/19 0248  . sulfamethoxazole-trimethoprim (BACTRIM DS) 800-160 MG per tablet 1 tablet  1 tablet Oral Q12H Franchot Gallo, MD      . zolpidem (AMBIEN) tablet 2.5 mg  2.5 mg Oral QHS PRN Franchot Gallo, MD         Objective: Vital signs in last 24 hours: Temp:  [96.4 F (35.8 C)-98.6 F (37 C)] 98.5 F (36.9 C) (09/22 0603) Pulse Rate:  [49-66] 52 (09/22 0603) Resp:  [8-20] 16 (09/22 0603) BP: (105-160)/(67-86) 141/77 (09/22 0603) SpO2:  [97 %-100 %] 97 % (09/22 0603) Weight:  [60.8 kg-62.3 kg] 62.3 kg (09/21 1655)  Intake/Output from previous day: 09/21 0701 - 09/22 0700 In: [id number] [P.O.:245; I.V.:1000] Out: 900 [Urine:900] Intake/Output this shift: No intake/output data recorded.   Physical Exam Vitals signs reviewed.  Constitutional:      Appearance: Normal  appearance.  Neck:     Musculoskeletal: Normal range of motion and neck supple.  Cardiovascular:     Rate and Rhythm: Normal rate and regular rhythm.  Pulmonary:     Effort: Pulmonary effort is normal. No respiratory distress.     Breath sounds: Normal breath sounds.  Abdominal:     General: Abdomen is flat.     Palpations: Abdomen is soft.     Tenderness: There is abdominal tenderness. There is left CVA tenderness.     Comments: Quiet BS,  NT site dressing dry.  No ecchymosis.   Musculoskeletal: Normal range of motion.        General: No swelling or tenderness.  Skin:    General: Skin is warm and dry.  Neurological:     General: No focal deficit present.     Mental Status: She is alert and oriented to person, place, and time.  Psychiatric:        Mood and Affect: Mood normal.        Behavior: Behavior normal.     Lab Results:  Recent Labs    08/13/19 0452  HGB 11.1*  HCT 36.7   BMET No results for input(s): NA, K, CL, CO2, GLUCOSE, BUN, CREATININE, CALCIUM in the last 72 hours. PT/INR Recent Labs    08/12/19 0830  LABPROT 13.1  INR 1.0   ABG No results for input(s): PHART, HCO3 in the last 72 hours.  Invalid input(s): PCO2, PO2  Studies/Results: Dg C-arm 1-60 Min-no Report  Result Date: 08/12/2019 Fluoroscopy was utilized by the requesting physician.  No radiographic interpretation.   Ir Ureteral Stent Left New Access W/o Sep Nephrostomy Cath  Result Date: 08/12/2019 INDICATION: Renal stones, access for left percutaneous nephrolithotomy. EXAM: 1. Percutaneous puncture of the renal collecting system under fluoroscopic guidance 2. Placement of a percutaneous nephrostomy tube Operating Physician:  Criselda Peaches, MD COMPARISON:  CT of the abdomen and pelvis-12/06/2018 MEDICATIONS: Ciprofloxacin 400 mg IV; The antibiotic was administered in an appropriate time frame prior to skin puncture. ANESTHESIA/SEDATION: Fentanyl 4 mcg IV; Versed 150 mg IV Moderate  Sedation Time:  22 minutes The patient was continuously monitored during the procedure by the interventional radiology nurse under my direct supervision. CONTRAST:  12 mL Omnipaque 300-administered into the collecting system(s) FLUOROSCOPY TIME:  Fluoroscopy Time: 2 minutes 18 seconds (17 mGy). COMPLICATIONS: None immediate. PROCEDURE: Informed written consent was obtained from the patient after a thorough discussion of the procedural risks, benefits and alternatives. All questions were addressed. Maximal Sterile Barrier Technique was utilized including caps,  mask, sterile gowns, sterile gloves, sterile drape, hand hygiene and skin antiseptic. A timeout was performed prior to the initiation of the procedure. A pre procedural spot fluoroscopic image was obtained of the upper abdomen. Ultrasound scanning performed of the kidney shows moderate hydronephrosis. As such, a posterior upper pole calyx was targeted with a [place] needle. Access to the collecting system was confirmed with advancement of a Nitrex wire into the collecting system. The needle was exchanged for the inner 3 French catheter from an [place] and contrast injection confirmed access. An [place] set was utilized to dilate the tract and was subsequently exchanged for a Kumpe catheter over a Bentson wire. The Kumpe catheter was advanced down the ureter and into the urinary bladder. Postprocedural spot radiographs were obtained in various obliquities and the catheter was sutured to the skin. The catheter was capped and a dressing was placed. The patient tolerated the procedure well without immediate postprocedural complication. IMPRESSION: Successful fluoroscopic guided left percutaneous nephrostomy with placement of a 5 French Kumpe catheter to the level of the urinary bladder to be utilized during impending nephrolithotomy procedure. Electronically Signed   By: Jacqulynn Cadet M.D.   On: 08/12/2019 10:42     Assessment and Plan: Left  renal stones s/p left tubeless PCNL with ureteral stent.   She has significant postop pain and nausea.  I will give her IV tylenol and have ordered IV hydromorphone as well.   TUMS ordered at her request.  I will reassess later today.   Ileus.  Pt encourage to walk.       LOS: 0 days    Irine Seal 08/13/2019 [id number] ID: [name], female   DOB: 1946-07-10, 73 y.o.   MRN: [id number]

## 2019-08-13 NOTE — Progress Notes (Signed)
Assumed care of patient at 1530. Condition stable. Cont with plan of care.

## 2019-08-13 NOTE — Progress Notes (Signed)
Pt ambulated x2 this shift with increasing abdominal pain. Last BM PTA. Pt given dulcolax suppository w/out relief and pain medication. Pt had episode of vomiting and requested phenergan. On call urologist, L. Noberto Retort, made aware. Verbal orders received for Zofran IV.

## 2019-08-14 ENCOUNTER — Inpatient Hospital Stay (HOSPITAL_COMMUNITY): Payer: Medicare Other

## 2019-08-14 MED ORDER — ACETAMINOPHEN 10 MG/ML IV SOLN
1000.0000 mg | Freq: Four times a day (QID) | INTRAVENOUS | Status: DC
  Administered 2019-08-14 (×2): 1000 mg via INTRAVENOUS
  Filled 2019-08-14 (×3): qty 100

## 2019-08-14 MED ORDER — FENTANYL CITRATE (PF) 100 MCG/2ML IJ SOLN
12.5000 ug | INTRAMUSCULAR | Status: DC | PRN
  Administered 2019-08-14: 12.5 ug via INTRAVENOUS
  Filled 2019-08-14: qty 2

## 2019-08-14 MED ORDER — OXYCODONE HCL 5 MG PO TABS
5.0000 mg | ORAL_TABLET | ORAL | Status: DC | PRN
  Administered 2019-08-14: 5 mg via ORAL
  Filled 2019-08-14: qty 1

## 2019-08-14 MED ORDER — KETOROLAC TROMETHAMINE 15 MG/ML IJ SOLN
15.0000 mg | Freq: Three times a day (TID) | INTRAMUSCULAR | Status: DC
  Administered 2019-08-14 – 2019-08-15 (×2): 15 mg via INTRAVENOUS
  Filled 2019-08-14 (×2): qty 1

## 2019-08-14 MED ORDER — IOHEXOL 300 MG/ML  SOLN
100.0000 mL | Freq: Once | INTRAMUSCULAR | Status: AC | PRN
  Administered 2019-08-14: 100 mL via INTRAVENOUS

## 2019-08-14 MED ORDER — SODIUM CHLORIDE (PF) 0.9 % IJ SOLN
INTRAMUSCULAR | Status: AC
  Filled 2019-08-14: qty 50

## 2019-08-14 MED ORDER — DEXTROSE 5 % IV SOLN: 500.0000 mg | INTRAVENOUS | Status: DC

## 2019-08-14 MED ORDER — SODIUM CHLORIDE 0.9 % IV SOLN
1.0000 g | INTRAVENOUS | Status: DC
  Administered 2019-08-14 – 2019-08-15 (×2): 1 g via INTRAVENOUS
  Filled 2019-08-14 (×2): qty 1

## 2019-08-14 MED ORDER — BISACODYL 10 MG RE SUPP
10.0000 mg | Freq: Once | RECTAL | Status: DC
  Filled 2019-08-14: qty 1

## 2019-08-14 NOTE — Progress Notes (Signed)
Patient refusing all medications this AM. Including bisacodyl suppository. Took Ditropan for pain, and received IV tylenol. Made x1 walk around the unit. Refusing Zofran. Up in chair sipping tea. Did not have breakfast.

## 2019-08-14 NOTE — Progress Notes (Signed)
Pt with increasing abdominal pain given prn oxy w/out relief. Pt anxious about possible causes and requests something more for pain relief. Pt ambulated x1 this PM w/ no BM or gas. Stomach tender, mildly distended, with audible bowel sounds. On call urologist, Izora Ribas, notifed of situation and verbal orders received. Pain dosage increased and pt encouraged to ambulate again. Pt refuses at this time d/t pain.

## 2019-08-14 NOTE — Progress Notes (Signed)
2 Days Post-Op Subjective: Patient reports continued left flank pain, no real flatus.  Some nausea yesterday after magnesium citrate.  Objective: Vital signs in last 24 hours: Temp:  [97.9 F (36.6 C)-98.6 F (37 C)] 98.1 F (36.7 C) (09/23 0454) Pulse Rate:  [48-57] 57 (09/23 0454) Resp:  [16] 16 (09/23 0454) BP: (125-145)/(73-83) 133/73 (09/23 0454) SpO2:  [98 %-100 %] 98 % (09/23 0454)  Intake/Output from previous day: 09/22 0701 - 09/23 0700 In: 1269.6 [P.O.:480; I.V.:600; IV Piggyback:189.6] Out: -  Intake/Output this shift: No intake/output data recorded.  Physical Exam:  Constitutional: Vital signs reviewed. WD WN in NAD   Eyes: PERRL, No scleral icterus.   Cardiovascular: RRR Pulmonary/Chest: Normal effort Abdominal: Tympanitic, no specific tenderness.  No flank tenderness.  Dressing dry Extremities: No cyanosis or edema   Lab Results: Recent Labs    08/13/19 0452  HGB 11.1*  HCT 36.7   BMET No results for input(s): NA, K, CL, CO2, GLUCOSE, BUN, CREATININE, CALCIUM in the last 72 hours. Recent Labs    08/12/19 0830  INR 1.0   No results for input(s): LABURIN in the last 72 hours. Results for orders placed or performed during the hospital encounter of 08/08/19  Novel Coronavirus, NAA (Hosp order, Send-out to Ref Lab; TAT 18-24 hrs     Status: None   Collection Time: 08/08/19  2:13 PM   Specimen: Nasopharyngeal Swab; Respiratory  Result Value Ref Range Status   SARS-CoV-2, NAA NOT DETECTED NOT DETECTED Final    Comment: (NOTE) This nucleic acid amplification test was developed and its performance characteristics determined by [employer]. Nucleic acid amplification tests include PCR and TMA. This test has not been FDA cleared or approved. This test has been authorized by FDA under an Emergency Use Authorization (EUA). This test is only authorized for the duration of time the declaration that circumstances exist justifying the authorization of  the emergency use of in vitro diagnostic tests for detection of SARS-CoV-2 virus and/or diagnosis of COVID-19 infection under section 564(b)(1) of the Act, 21 U.S.C. [id number]) (1), unless the authorization is terminated or revoked sooner. When diagnostic testing is negative, the possibility of a false negative result should be considered in the context of a patient's recent exposures and the presence of clinical signs and symptoms consistent with COVID-19. An individual without symptoms of COVID- 19 and who is not shedding SARS-CoV-2 vi rus would expect to have a negative (not detected) result in this assay. Performed At: [facility] [address] [place], [place] [id number] Rush Farmer MD [id number]    [place]  Final    Comment: Performed at [facility], [place] [address] [place], [place] [address]    Studies/Results: Dg C-arm 1-60 Min-no Report  Result Date: 08/12/2019 Fluoroscopy was utilized by the requesting physician.  No radiographic interpretation.   Ir Ureteral Stent Left New Access W/o Sep Nephrostomy Cath  Result Date: 08/12/2019 INDICATION: Renal stones, access for left percutaneous nephrolithotomy. EXAM: 1. Percutaneous puncture of the renal collecting system under fluoroscopic guidance 2. Placement of a percutaneous nephrostomy tube Operating Physician:  Criselda Peaches, MD COMPARISON:  CT of the abdomen and pelvis-12/06/2018 MEDICATIONS: Ciprofloxacin 400 mg IV; The antibiotic was administered in an appropriate time frame prior to skin puncture. ANESTHESIA/SEDATION: Fentanyl 4 mcg IV; Versed 150 mg IV Moderate Sedation Time:  22 minutes The patient was continuously monitored during the procedure by the interventional radiology nurse under my direct supervision. CONTRAST:  12  mL Omnipaque 300-administered into the collecting system(s) FLUOROSCOPY TIME:  Fluoroscopy Time: 2 minutes 18 seconds (17 mGy). COMPLICATIONS: None  immediate. PROCEDURE: Informed written consent was obtained from the patient after a thorough discussion of the procedural risks, benefits and alternatives. All questions were addressed. Maximal Sterile Barrier Technique was utilized including caps, mask, sterile gowns, sterile gloves, sterile drape, hand hygiene and skin antiseptic. A timeout was performed prior to the initiation of the procedure. A pre procedural spot fluoroscopic image was obtained of the upper abdomen. Ultrasound scanning performed of the kidney shows moderate hydronephrosis. As such, a posterior upper pole calyx was targeted with a Shafer needle. Access to the collecting system was confirmed with advancement of a Nitrex wire into the collecting system. The needle was exchanged for the inner 3 French catheter from an [place] and contrast injection confirmed access. An Accustick set was utilized to dilate the tract and was subsequently exchanged for a Kumpe catheter over a Bentson wire. The Kumpe catheter was advanced down the ureter and into the urinary bladder. Postprocedural spot radiographs were obtained in various obliquities and the catheter was sutured to the skin. The catheter was capped and a dressing was placed. The patient tolerated the procedure well without immediate postprocedural complication. IMPRESSION: Successful fluoroscopic guided left percutaneous nephrostomy with placement of a 5 French Kumpe catheter to the level of the urinary bladder to be utilized during impending nephrolithotomy procedure. Electronically Signed   By: Jacqulynn Cadet M.D.   On: 08/12/2019 10:42    Assessment/Plan:   Postoperative day #2 left percutaneous nephrolithotomy which was an uncomplicated procedure.  Hemoglobin yesterday was normal/stable.  She is not responding to laxatives.    I will check renal ultrasound today to check status of left kidney.  Dulcolax from below, ambulate, every 6 hours Tylenol IV.   LOS: 1 day    Jorja Loa 08/14/2019, 6:45 AM

## 2019-08-14 NOTE — Progress Notes (Signed)
2 Days Post-Op Subjective: Patient reports that she still feels bad.  Both renal ultrasound and CT abdomen/pelvis have been performed.  Objective: Vital signs in last 24 hours: Temp:  [98.1 F (36.7 C)-98.6 F (37 C)] 98.6 F (37 C) (09/23 1300) Pulse Rate:  [48-61] 61 (09/23 1300) Resp:  [16-18] 18 (09/23 1300) BP: (125-133)/(73-83) 125/77 (09/23 1300) SpO2:  [98 %-100 %] 100 % (09/23 1300)  Intake/Output from previous day: 09/22 0701 - 09/23 0700 In: 1269.6 [P.O.:480; I.V.:600; IV Piggyback:189.6] Out: -  Intake/Output this shift: Total I/O In: 2136.2 [P.O.:240; I.V.:1796.2; IV Piggyback:100] Out: -   Physical Exam:  Constitutional: Vital signs reviewed. WD WN in NAD   Eyes: PERRL, No scleral icterus.   Cardiovascular: RRR Pulmonary/Chest: Normal effort Extremities: No cyanosis or edema   Lab Results: Recent Labs    08/13/19 0452  HGB 11.1*  HCT 36.7   BMET No results for input(s): NA, K, CL, CO2, GLUCOSE, BUN, CREATININE, CALCIUM in the last 72 hours. Recent Labs    08/12/19 0830  INR 1.0   No results for input(s): LABURIN in the last 72 hours. Results for orders placed or performed during the hospital encounter of 08/08/19  Novel Coronavirus, NAA (Hosp order, Send-out to Ref Lab; TAT 18-24 hrs     Status: None   Collection Time: 08/08/19  2:13 PM   Specimen: Nasopharyngeal Swab; Respiratory  Result Value Ref Range Status   SARS-CoV-2, NAA NOT DETECTED NOT DETECTED Final    Comment: (NOTE) This nucleic acid amplification test was developed and its performance characteristics determined by [employer]. Nucleic acid amplification tests include PCR and TMA. This test has not been FDA cleared or approved. This test has been authorized by FDA under an Emergency Use Authorization (EUA). This test is only authorized for the duration of time the declaration that circumstances exist justifying the authorization of the emergency use of in vitro diagnostic  tests for detection of SARS-CoV-2 virus and/or diagnosis of COVID-19 infection under section 564(b)(1) of the Act, 21 U.S.C. [id number]) (1), unless the authorization is terminated or revoked sooner. When diagnostic testing is negative, the possibility of a false negative result should be considered in the context of a patient's recent exposures and the presence of clinical signs and symptoms consistent with COVID-19. An individual without symptoms of COVID- 19 and who is not shedding SARS-CoV-2 vi rus would expect to have a negative (not detected) result in this assay. Performed At: [facility] [address] [place], [place] [id number] Rush Farmer MD [id number]    [place]  Final    Comment: Performed at [facility], [place] [address], [place], [place] [address]    Studies/Results: Ct Abdomen Pelvis W Contrast  Result Date: 08/14/2019 CLINICAL DATA:  Two days post percutaneous nephrolithotomy on the left side. Patient is having left flank pain. EXAM: CT ABDOMEN AND PELVIS WITH CONTRAST TECHNIQUE: Multidetector CT imaging of the abdomen and pelvis was performed using the standard protocol following bolus administration of intravenous contrast. CONTRAST:  143mL OMNIPAQUE IOHEXOL 300 MG/ML  SOLN COMPARISON:  CT scan 12/06/2018 FINDINGS: Lower chest: Small bilateral pleural effusions with bibasilar atelectasis. The heart is normal in size. No pericardial effusion. Hepatobiliary: No focal hepatic lesions or intrahepatic biliary dilatation. The gallbladder is mildly distended but no definite gallstones. No common bile duct dilatation. Pancreas: No mass, inflammation or ductal dilatation. Spleen: Normal size.  No focal lesions. Adrenals/Urinary Tract: The adrenal glands are unremarkable. The right kidney  does not demonstrate any significant findings. There is a prominent extra renal pelvis noted no renal lesions or hydronephrosis. Left kidney: Surgical  changes from recent left-sided nephrolithotomy. There are some residual stone fragments noted in the lower pole region of left kidney. There is a fair amount of fluid in the left perirenal space and on the delayed images there is evidence of contrast extravasation through the posterior mid pole posterior calyx, through the renal parenchyma into the perirenal space. The left-sided double-J ureteral stent is coiled in the upper ureter below the renal pelvis and there is mild left-sided hydronephrosis. I suspect that the position of the double-J ureteral stent may not be draining the left ureter and collecting system well and a path of least resistance is now back through the lithotomy site. The left double-J ureteral stent may need to be repositioned to prevent continued extravasation of urine/perinephric urinoma. Bladder: The double-J ureteral stent is in the bladder. No bladder mass or hematoma. Small amount of expected air. Stomach/Bowel: The stomach, duodenum and small bowel are unremarkable. There is moderate scattered stool and gas throughout the colon which may suggest colonic ileus. Vascular/Lymphatic: The aorta and branch vessels are patent. Moderate tortuosity and mild ectasia but no focal aneurysm or dissection. The branch vessels are patent. The major venous structures are patent. No mesenteric or retroperitoneal mass or adenopathy. Reproductive: Prominent endometrium for the patient's age measuring up to 25 mm. Recommend GYN consultation and endometrial sampling. The ovaries appear normal. Other: Small amount of free pelvic fluid. Moderate perinephric fluid on the left. Musculoskeletal: No significant bony findings. Advanced degenerative changes involving the spine. IMPRESSION: 1. Moderate perinephric fluid and evidence of contrast extravasation from the mid pole posterior calyx and into the perinephric space. 2. The proximal portion of the double-J ureteral stent is in the proximal ureter below the renal  pelvis and may not be draining the collecting system very well. The path of least resistance may be through the nephrolithotomy site. This may need to be repositioned. 3. Residual stone fragments in the lower pole of the left kidney. No definite ureteral calculi. 4. Small bilateral pleural effusions and bibasilar atelectasis. 5. Mild colonic ileus. 6. **An incidental finding of potential clinical significance has been found. Thickened endometrium for the patient's age. Recommend GYN consultation and consideration for endometrial sampling.** These results will be called to the ordering clinician or representative by the Radiologist Assistant, and communication documented in the PACS or zVision Dashboard. Electronically Signed   By: Marijo Sanes M.D.   On: 08/14/2019 15:50   US Renal  Addendum Date: 08/14/2019   ADDENDUM REPORT: 08/14/2019 09:52 ADDENDUM: These results were called by telephone at the time of interpretation on 08/14/2019 at 9:52 am to provider [facility] , who verbally acknowledged these results. Electronically Signed   By: Zetta Bills M.D.   On: 08/14/2019 09:52   Result Date: 08/14/2019 CLINICAL DATA:  History of left flank pain, concern for perinephric fluid collection. History of prior left percutaneous nephrolithotomy. EXAM: RENAL / URINARY TRACT ULTRASOUND COMPLETE COMPARISON:  CT study of December 06, 2018. Prior ultrasound 09/23/2014 FINDINGS: Right Kidney: Renal measurements: 12.0 x 5 x 4 cm = volume: 128 mL. Signs of mild right-sided hydronephrosis. No visible calculi or signs of mass. Left Kidney: Renal measurements: 12.0 x 5.5 x 3.8 cm = volume: 130 mL. No signs of hydronephrosis on the left. Between left kidney and spleen there is a 7.5 by 1.0 cm collection extending through perinephric fat. Some  complexity with variable echogenicity is also noted. Bladder: Urinary bladder is decompressed limiting assessment. IMPRESSION: 1. Findings of perinephric fluid on the left that may  represent blood or urine in this patient status post recent nephrolithotomy 2. Mild right hydronephrosis. 3. CT may be helpful for further assessment of the above findings. 4. At the time of dictation a call is out to the referring provider to discuss findings above. Electronically Signed: By: Zetta Bills M.D. On: 08/14/2019 09:39   I reviewed both renal ultrasound and CT images independently. Assessment/Plan:   Postoperative day 2 left percutaneous nephrolithotomy.  She has extravasation of urine from her percutaneous puncture in her retroperitoneum and thus resultant ileus.  This is most likely due to distal migration of her ureteral stent.  This needs fairly urgent management.    I have spoken with the patient and left a message with her husband on voicemail about proceeding in the morning with cystoscopy, stent exchange.  I think this, with short-term Foley catheter management, will allow adequate drainage of her upper tract and thus limit her further leakage.  She is in agreement with this. In the meantime will place foley catheter.   LOS: 1 day   Jorja Loa 08/14/2019, 6:24 PM

## 2019-08-15 ENCOUNTER — Inpatient Hospital Stay (HOSPITAL_COMMUNITY): Payer: Medicare Other | Admitting: Anesthesiology

## 2019-08-15 ENCOUNTER — Encounter (HOSPITAL_COMMUNITY)
Admission: RE | Disposition: A | Discharge status: Home / Self Care | Payer: Self-pay | Source: Home / Self Care | Attending: Urology

## 2019-08-15 ENCOUNTER — Inpatient Hospital Stay (HOSPITAL_COMMUNITY): Payer: Medicare Other

## 2019-08-15 ENCOUNTER — Encounter (HOSPITAL_COMMUNITY): Payer: Self-pay | Admitting: Anesthesiology

## 2019-08-15 HISTORY — PX: CYSTOSCOPY WITH STENT PLACEMENT: SHX5790

## 2019-08-15 SURGERY — CYSTOSCOPY, WITH STENT INSERTION
Anesthesia: General | Laterality: Left

## 2019-08-15 MED ORDER — OXYCODONE HCL 5 MG PO TABS: 5.0000 mg | ORAL_TABLET | Freq: Once | ORAL | Status: DC | PRN

## 2019-08-15 MED ORDER — ONDANSETRON HCL 4 MG/2ML IJ SOLN
INTRAMUSCULAR | Status: AC
  Filled 2019-08-15: qty 6

## 2019-08-15 MED ORDER — CHLORHEXIDINE GLUCONATE CLOTH 2 % EX PADS: 6.0000 | MEDICATED_PAD | Freq: Every day | CUTANEOUS | Status: DC

## 2019-08-15 MED ORDER — OXYCODONE HCL 5 MG/5ML PO SOLN: 5.0000 mg | Freq: Once | ORAL | Status: DC | PRN

## 2019-08-15 MED ORDER — SCOPOLAMINE 1 MG/3DAYS TD PT72
1.0000 | MEDICATED_PATCH | TRANSDERMAL | Status: DC
  Administered 2019-08-15: 07:00:00 1.5 mg via TRANSDERMAL

## 2019-08-15 MED ORDER — LIDOCAINE HCL (CARDIAC) PF 100 MG/5ML IV SOSY
PREFILLED_SYRINGE | INTRAVENOUS | Status: DC | PRN
  Administered 2019-08-15: 100 mg via INTRAVENOUS

## 2019-08-15 MED ORDER — DEXAMETHASONE SODIUM PHOSPHATE 4 MG/ML IJ SOLN
INTRAMUSCULAR | Status: DC | PRN
  Administered 2019-08-15: 10 mg via INTRAVENOUS

## 2019-08-15 MED ORDER — EPHEDRINE SULFATE 50 MG/ML IJ SOLN
INTRAMUSCULAR | Status: DC | PRN
  Administered 2019-08-15: 10 mg via INTRAVENOUS
  Administered 2019-08-15: 5 mg via INTRAVENOUS

## 2019-08-15 MED ORDER — PROPOFOL 10 MG/ML IV BOLUS
INTRAVENOUS | Status: AC
  Filled 2019-08-15: qty 40

## 2019-08-15 MED ORDER — DEXAMETHASONE SODIUM PHOSPHATE 10 MG/ML IJ SOLN
INTRAMUSCULAR | Status: AC
  Filled 2019-08-15: qty 3

## 2019-08-15 MED ORDER — LIDOCAINE 2% (20 MG/ML) 5 ML SYRINGE
INTRAMUSCULAR | Status: AC
  Filled 2019-08-15: qty 15

## 2019-08-15 MED ORDER — LACTATED RINGERS IV SOLN
INTRAVENOUS | Status: DC | PRN
  Administered 2019-08-15: 06:00:00 via INTRAVENOUS
  Administered 2019-08-15: 1000 mL

## 2019-08-15 MED ORDER — FENTANYL CITRATE (PF) 100 MCG/2ML IJ SOLN
INTRAMUSCULAR | Status: AC
  Filled 2019-08-15: qty 2

## 2019-08-15 MED ORDER — PROPOFOL 500 MG/50ML IV EMUL
INTRAVENOUS | Status: DC | PRN
  Administered 2019-08-15: 100 ug/kg/min via INTRAVENOUS

## 2019-08-15 MED ORDER — SODIUM CHLORIDE 0.9 % IR SOLN
Status: DC | PRN
  Administered 2019-08-15: 3000 mL

## 2019-08-15 MED ORDER — ONDANSETRON HCL 4 MG/2ML IJ SOLN
INTRAMUSCULAR | Status: DC | PRN
  Administered 2019-08-15: 4 mg via INTRAVENOUS

## 2019-08-15 MED ORDER — SCOPOLAMINE 1 MG/3DAYS TD PT72
MEDICATED_PATCH | TRANSDERMAL | Status: AC
  Filled 2019-08-15: qty 1

## 2019-08-15 MED ORDER — MIDAZOLAM HCL 2 MG/2ML IJ SOLN
INTRAMUSCULAR | Status: AC
  Filled 2019-08-15: qty 2

## 2019-08-15 MED ORDER — FENTANYL CITRATE (PF) 100 MCG/2ML IJ SOLN
INTRAMUSCULAR | Status: DC | PRN
  Administered 2019-08-15 (×2): 50 ug via INTRAVENOUS

## 2019-08-15 MED ORDER — LACTATED RINGERS IV SOLN: INTRAVENOUS | Status: DC

## 2019-08-15 MED ORDER — PROPOFOL 10 MG/ML IV BOLUS
INTRAVENOUS | Status: DC | PRN
  Administered 2019-08-15: 200 mg via INTRAVENOUS

## 2019-08-15 MED ORDER — ONDANSETRON HCL 4 MG/2ML IJ SOLN: 4.0000 mg | Freq: Four times a day (QID) | INTRAMUSCULAR | Status: DC | PRN

## 2019-08-15 MED ORDER — MIDAZOLAM HCL 5 MG/5ML IJ SOLN
INTRAMUSCULAR | Status: DC | PRN
  Administered 2019-08-15: 2 mg via INTRAVENOUS

## 2019-08-15 MED ORDER — FENTANYL CITRATE (PF) 100 MCG/2ML IJ SOLN: 25.0000 ug | INTRAMUSCULAR | Status: DC | PRN

## 2019-08-15 SURGICAL SUPPLY — 17 items
BAG URO CATCHER STRL LF (MISCELLANEOUS) ×3 IMPLANT
CATH INTERMIT  6FR 70CM (CATHETERS) IMPLANT
CLOTH BEACON ORANGE TIMEOUT ST (SAFETY) ×3 IMPLANT
COVER WAND RF STERILE (DRAPES) IMPLANT
GLOVE BIOGEL M 8.0 STRL (GLOVE) ×3 IMPLANT
GOWN STRL REUS W/ TWL XL LVL3 (GOWN DISPOSABLE) ×1 IMPLANT
GOWN STRL REUS W/TWL LRG LVL3 (GOWN DISPOSABLE) ×3 IMPLANT
GOWN STRL REUS W/TWL XL LVL3 (GOWN DISPOSABLE) ×2
GUIDEWIRE ANG ZIPWIRE 038X150 (WIRE) IMPLANT
GUIDEWIRE STR DUAL SENSOR (WIRE) ×3 IMPLANT
KIT TURNOVER KIT A (KITS) IMPLANT
MANIFOLD NEPTUNE II (INSTRUMENTS) ×3 IMPLANT
PACK CYSTO (CUSTOM PROCEDURE TRAY) ×3 IMPLANT
STENT CONTOUR 6FRX24X.038 (STENTS) ×3 IMPLANT
TUBING CONNECTING 10 (TUBING) ×2 IMPLANT
TUBING CONNECTING 10' (TUBING) ×1
TUBING UROLOGY SET (TUBING) IMPLANT

## 2019-08-15 NOTE — Transfer of Care (Signed)
Immediate Anesthesia Transfer of Care Note  Patient: [name]  Procedure(s) Performed: Procedure(s): CYSTOSCOPY, left retrograde pyleogram  WITH STENT EXCHANGE left and foley placement (Left)  Patient Location: PACU  Anesthesia Type:General  Level of Consciousness:  sedated, patient cooperative and responds to stimulation  Airway & Oxygen Therapy:Patient Spontanous Breathing and Patient connected to face mask oxgen  Post-op Assessment:  Report given to PACU RN and Post -op Vital signs reviewed and stable  Post vital signs:  Reviewed and stable  Last Vitals:  Vitals:   08/13/19 1408 08/15/19 0811  BP: (!) 145/75 (!) 91/52  Pulse: (!) 52 66  Resp: 16   Temp: 36.6 C   SpO2: [id number]    Complications: No apparent anesthesia complications

## 2019-08-15 NOTE — Op Note (Signed)
Preoperative diagnosis: History of recent left percutaneous nephrolithotomy, tubeless, with migrated stent and subsequent urinoma  Postoperative diagnosis: Same  Principal procedure: Cystoscopy, left double-J stent extraction, left retrograde ureteropyelogram with fluoroscopic interpretation, left double-J stent replacement (24 cm x 6 [place] contour, without tether)  Surgeon: Everett Ricciardelli  Anesthesia: General with LMA  Complications: None  Drains: 75 French Foley catheter  Estimated blood loss: None  Indications: 73 year old female 3 days out from uncomplicated left percutaneous nephrolithotomy.  She was left without a nephrostomy tube following placement of hemostatic agent in the nephrostomy tract as well as placement of a double-J stent.  The patient developed an ileus and abdominal pain, and recent evaluation included ultrasound/contrasted CTs showing a small amount of fluid around the left kidney.  On CT, there was evidence of extravasation of contrast.  The stent had migrated past the ureteropelvic junction, which no doubt contributed to this process.  She presents at this time for double-J stent extraction/replacement.  I discussed the process with the patient and her husband.  Findings: Bladder appeared normal with the exception of a longer than normal section of stent curled in the bladder.  After extraction of the stent, retrograde ureteropyelogram was performed revealing a normal ureter.  There was some filling defect in the lower pole calyceal system most likely blood.  Upper pole calyx appeared normal.  Description of procedure: The patient was properly identified and had been previously marked.  She was taken to the operating room where general anesthetic was administered.  She was placed in the dorsolithotomy position.  Genitalia and perineum were prepped and draped, proper timeout was performed.  21 French panendoscope was placed on her bladder with systematic inspection revealing  normal bladder with the stent seen.  The stent was grasped and extracted.  Retrograde ureteropyelogram was performed using 6 [place] open-ended catheter with the above-mentioned findings.  Following this, the sensor tip guidewire was advanced through the open-ended catheter, with a curl seen in the upper pole calyx.  The open-ended catheter was removed and I negotiated a 24 cm a 6 [place] contour double-J stent over top of the guidewire.  Once adequately positioned, the guidewire was removed.  A good curl was seen in the upper pole calyx, and the other curl was seen directly in the bladder with the scope.  The scope was then removed.  A 16 French catheter was then placed in the balloon filled with 10 cc of water.  This was hooked to dependent drainage. The procedure was then terminated.  She was awakened and taken to the PACU in stable condition.

## 2019-08-15 NOTE — Anesthesia Procedure Notes (Signed)
Procedure Name: LMA Insertion Date/Time: 08/15/2019 7:55 AM Performed by: Lavina Hamman, CRNA Pre-anesthesia Checklist: Patient identified, Emergency Drugs available, Suction available and Patient being monitored Patient Re-evaluated:Patient Re-evaluated prior to induction Oxygen Delivery Method: Circle System Utilized Preoxygenation: Pre-oxygenation with 100% oxygen Induction Type: IV induction Ventilation: Mask ventilation without difficulty LMA: LMA inserted LMA Size: 4.0 Number of attempts: 1 Airway Equipment and Method: Bite block Placement Confirmation: positive ETCO2 Tube secured with: Tape Dental Injury: Teeth and Oropharynx as per pre-operative assessment

## 2019-08-15 NOTE — Anesthesia Preprocedure Evaluation (Signed)
Anesthesia Evaluation  Patient identified by MRN, date of birth, ID band Patient awake    Reviewed: Allergy & Precautions, H&P , NPO status , Patient's Chart, lab work & pertinent test results  History of Anesthesia Complications (+) PONV and history of anesthetic complications  Airway Mallampati: II   Neck ROM: full    Dental   Pulmonary neg pulmonary ROS,    breath sounds clear to auscultation       Cardiovascular hypertension, +CHF and + DOE  + Valvular Problems/Murmurs MVP  Rhythm:regular Rate:Normal     Neuro/Psych    GI/Hepatic IBS   Endo/Other  Hypothyroidism Hyperthyroidism   Renal/GU Renal diseasestones     Musculoskeletal  (+) Arthritis ,   Abdominal   Peds  Hematology  (+) Blood dyscrasia, anemia ,   Anesthesia Other Findings   Reproductive/Obstetrics                             Anesthesia Physical Anesthesia Plan  ASA: II  Anesthesia Plan: General   Post-op Pain Management:    Induction: Intravenous  PONV Risk Score and Plan: 4 or greater and Ondansetron, Dexamethasone, Midazolam, Scopolamine patch - Pre-op and Treatment may vary due to age or medical condition  Airway Management Planned: LMA  Additional Equipment:   Intra-op Plan:   Post-operative Plan:   Informed Consent: I have reviewed the patients History and Physical, chart, labs and discussed the procedure including the risks, benefits and alternatives for the proposed anesthesia with the patient or authorized representative who has indicated his/her understanding and acceptance.       Plan Discussed with: CRNA, Anesthesiologist and Surgeon  Anesthesia Plan Comments:         Anesthesia Quick Evaluation

## 2019-08-16 ENCOUNTER — Encounter (HOSPITAL_COMMUNITY): Payer: Self-pay | Admitting: Urology

## 2019-08-16 MED ORDER — CEPHALEXIN 500 MG PO CAPS: 500.0000 mg | ORAL_CAPSULE | Freq: Two times a day (BID) | ORAL | 0 refills | Status: DC

## 2019-08-16 NOTE — Progress Notes (Signed)
Patient is stable for discharge. Discharge instructions and medications have been reviewed with the patient and all questions answered. AVS and prescriptions given to patient. Patient provided with leg bag and overnight foley bag. 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 RN did visualize patient changing to the leg bag appropriately. Patient states she is comfortable with foley care at home.   Frederick Marro, Fraser Din 08/16/2019 10:17 AM

## 2019-08-16 NOTE — Anesthesia Postprocedure Evaluation (Signed)
Anesthesia Post Note  Patient: [name]  Procedure(s) Performed: CYSTOSCOPY, left retrograde pyleogram  WITH STENT EXCHANGE left and foley placement (Left )     Patient location during evaluation: PACU Anesthesia Type: General Level of consciousness: awake and alert Pain management: pain level controlled Vital Signs Assessment: post-procedure vital signs reviewed and stable Respiratory status: spontaneous breathing, nonlabored ventilation, respiratory function stable and patient connected to nasal cannula oxygen Cardiovascular status: blood pressure returned to baseline and stable Postop Assessment: no apparent nausea or vomiting Anesthetic complications: no    Last Vitals:  Vitals:   08/15/19 0845 08/15/19 0911  BP:  113/69  Pulse:  65  Resp: 14 16  Temp: 36.7 C 36.6 C  SpO2:  97%    Last Pain:  Vitals:   08/16/19 0830  TempSrc:   PainSc: 0-No pain                 Lysandra Loughmiller S

## 2019-08-19 ENCOUNTER — Encounter: Payer: Self-pay | Admitting: *Deleted

## 2019-08-19 ENCOUNTER — Other Ambulatory Visit: Payer: Self-pay | Admitting: *Deleted

## 2019-08-19 NOTE — Patient Outreach (Signed)
Follow up for RED FLAG on EMMI POST DISCHARGE CALL: NO FOLLOW UP VISIT SCHEDULED.  Called but did not reach member. I was able to leave a message and and requested a return call. I do not see any follow up appts scheduled in Epic, however, the provider may not use Epic.  [facility] to ensure pt has a follow up appt. Had to leave a message there.  Pt returned my call today at 4:45 to advise she does have a follow up appt on Friday at 10:30 with Dr. Vernie Shanks.  She voices no concerns at this time. Advised of [facility] care management services and will send a letter.  Eulah Pont. Myrtie Neither, MSN, [facility] Gerontological Nurse Practitioner [facility] Care Management [phone]

## 2019-08-22 DIAGNOSIS — J189 Pneumonia, unspecified organism: Secondary | ICD-10-CM

## 2019-08-22 HISTORY — DX: Pneumonia, unspecified organism: J18.9

## 2019-08-23 DIAGNOSIS — N200 Calculus of kidney: Secondary | ICD-10-CM | POA: Diagnosis not present

## 2019-08-23 IMAGING — CT CT ANGIO CHEST
2 of 7 series · 18 of 46 positions shown · IV contrast (ISOVUE)
Comparison: Chest two-view 09/26/2018

CLINICAL DATA: Suspect pulmonary embolism.  Recent pneumonia.

EXAM:
CT ANGIOGRAPHY CHEST WITH CONTRAST
TECHNIQUE: Multidetector CT imaging of the chest was performed using the
standard protocol during bolus administration of intravenous
contrast. Multiplanar CT image reconstructions and MIPs were
obtained to evaluate the vascular anatomy.
CONTRAST:  100mL N5NMNP-IOX IOPAMIDOL (N5NMNP-IOX) INJECTION 76%

[Series 5: thins · axial · 0.73mm/px · z∈[-6,+226]mm · 15 of 264 slices shown]
[im 16/264  lung]
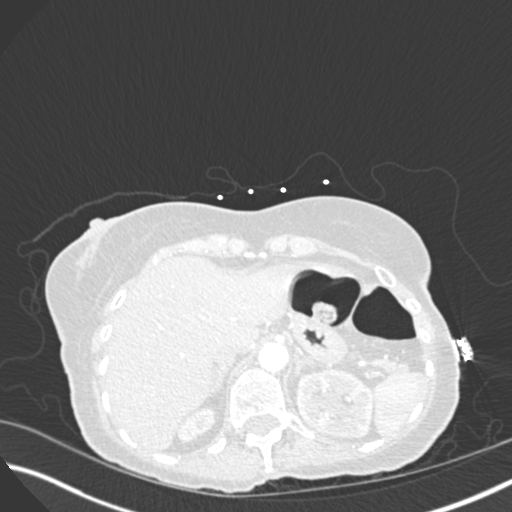
[im 31/264  soft-tissue]
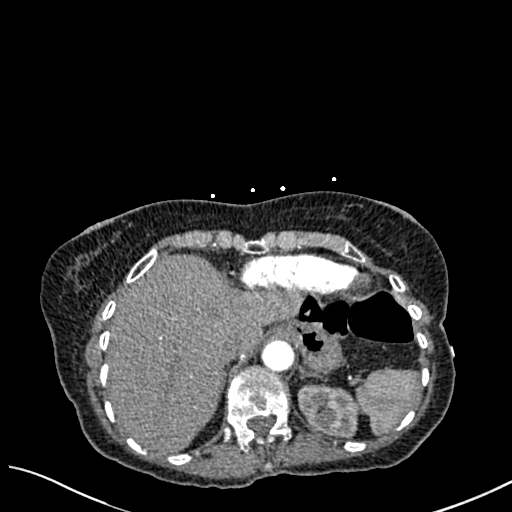
[im 47/264  lung]
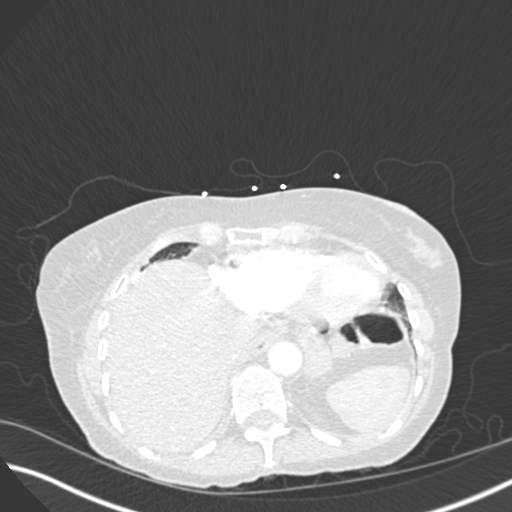
[im 62/264  soft-tissue]
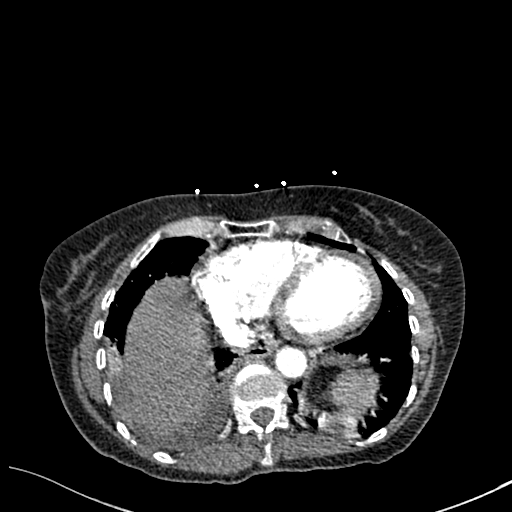
[im 78/264  lung]
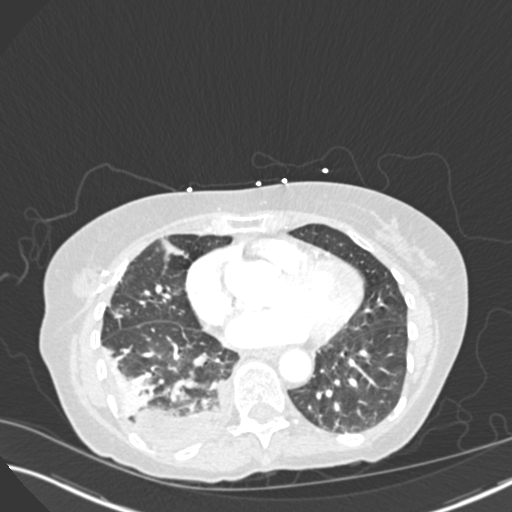
[im 93/264  soft-tissue]
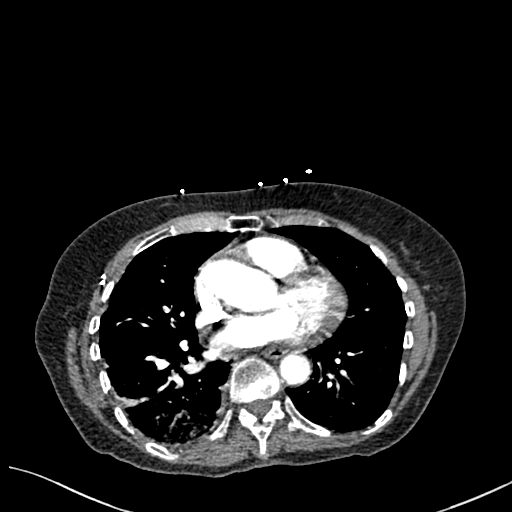
[im 109/264  lung]
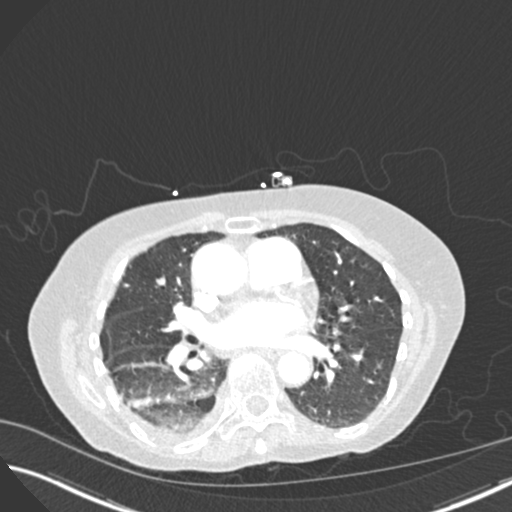
[im 140/264  soft-tissue]
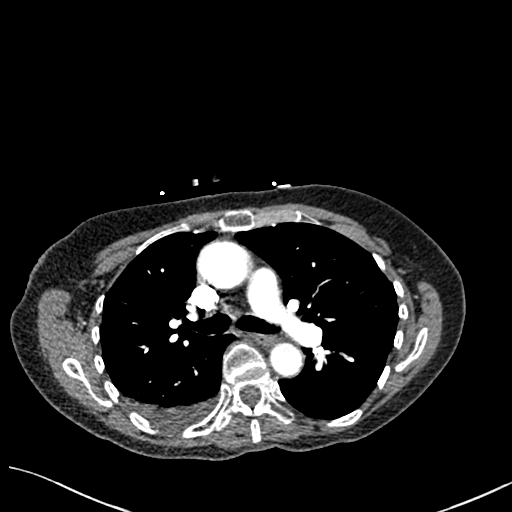
[im 155/264  lung]
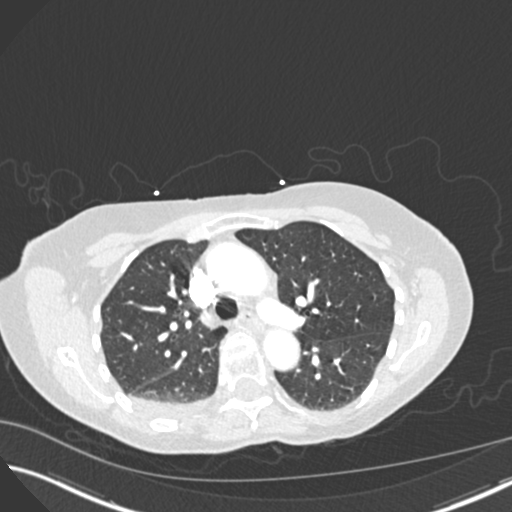
[im 171/264  soft-tissue]
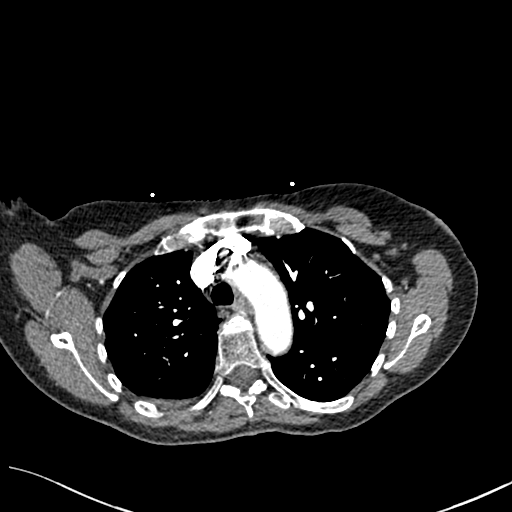
[im 186/264  lung]
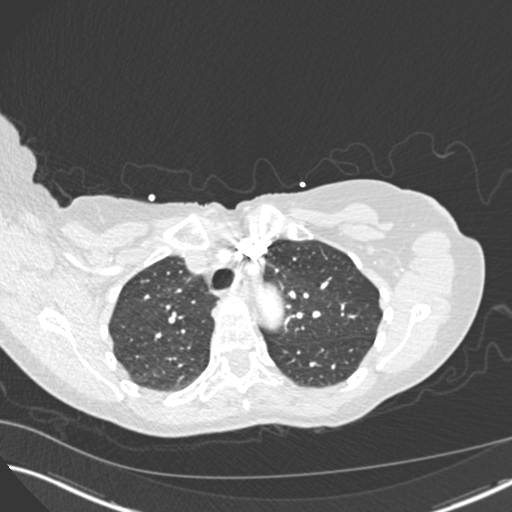
[im 202/264  soft-tissue]
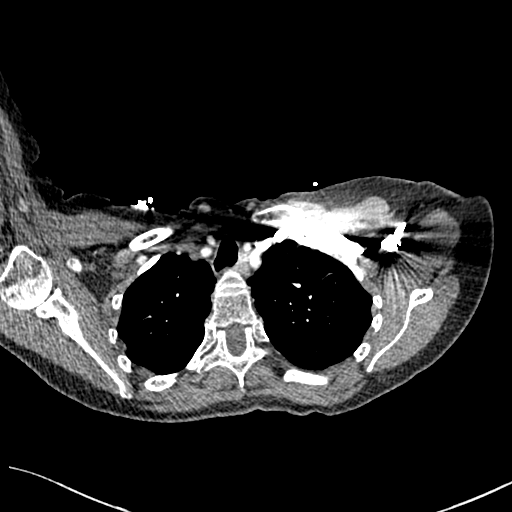
[im 217/264  lung]
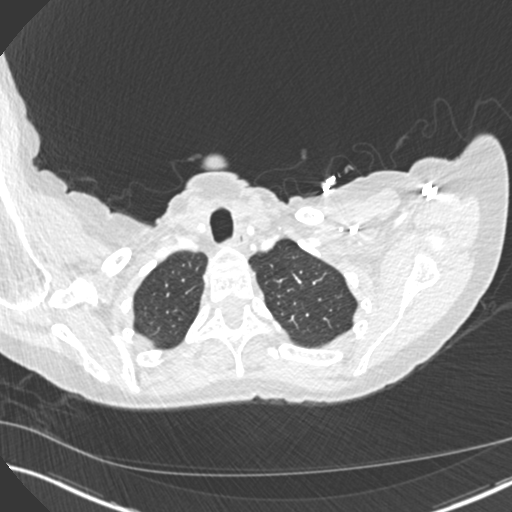
[im 233/264  soft-tissue]
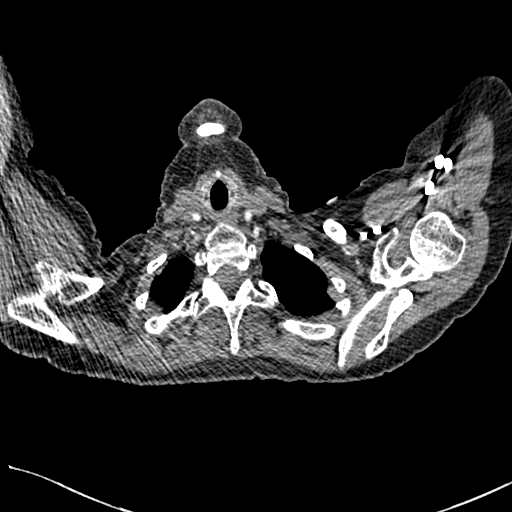
[im 248/264  lung]
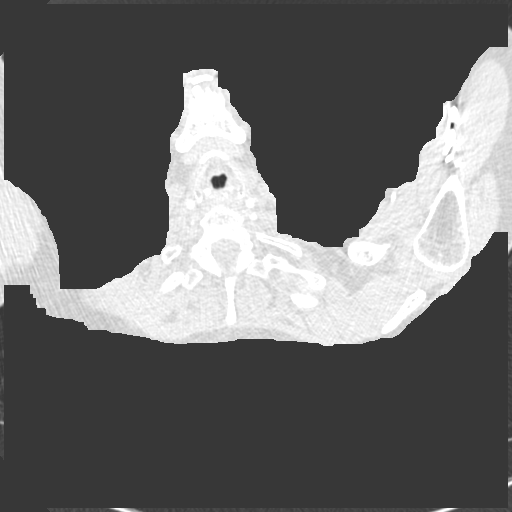

[Series 6: coronal mpr · coronal · 0.54mm/px · 3 of 112 slices shown]
[im 28/112  soft-tissue]
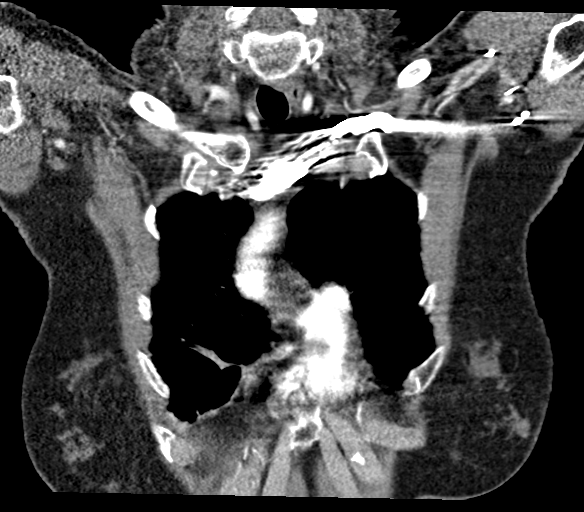
[im 56/112  soft-tissue]
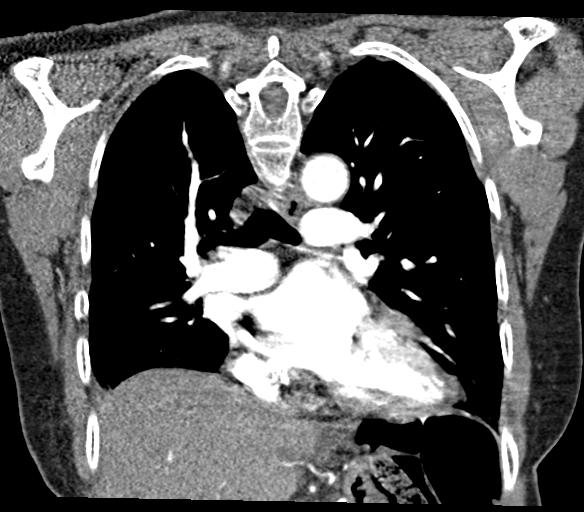
[im 84/112  soft-tissue]
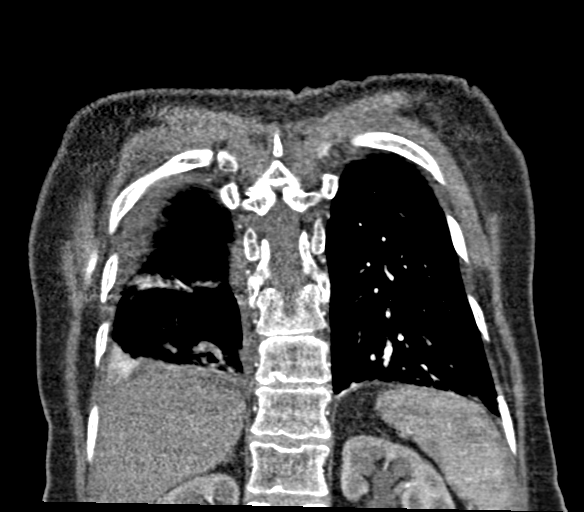

[18 of 46 positions shown; findings below may reference images not displayed]

FINDINGS: Cardiovascular: Small volume pulmonary emboli. Small peripheral
emboli in the right lower lobe more right middle lobe, and lingula.
Pulmonary arteries not enlarged. RV/LV ratio 1.3 suggesting right
heart strain. No pericardial effusion. Normal thoracic aorta

Mediastinum/Nodes: Negative for mass or adenopathy

Lungs/Pleura: Small right pleural effusion. Patchy airspace disease
in the right lung base may reflect pulmonary infarct or pneumonia.
Mild airspace disease right middle lobe. No effusion on the left.
Minimal dependent atelectasis left lung base.

Upper Abdomen: Negative

Musculoskeletal: Negative

Review of the MIP images confirms the above findings.
IMPRESSION: Small volume pulmonary emboli most prominent right lower lobe. Small
right effusion and right lower lobe airspace disease likely due to
pulmonary infarction

RV/LV ratio 1.3 suggesting right heart strain. Given the relatively
small volume pulmonary emboli, the patient may have pre-existing
pulmonary hypertension.

These results were called by telephone at the time of interpretation
on 10/03/2018 at [DATE] to Dr. DAVIS JIM , who verbally
acknowledged these results.

## 2019-09-05 NOTE — Discharge Summary (Signed)
Patient ID: [name] MRN: [id number] DOB/AGE: 11/28/1945 73 y.o.  Admit date: 08/12/2019 Discharge date 08/13/2019  Primary Care Physician:  Hulan Fess, MD  Discharge Diagnoses: Renal calculus Present on Admission: **None**   Consults: None   Discharge Medications: Allergies as of 08/16/2019      Reactions   Dilaudid [hydromorphone Hcl] Nausea And Vomiting   Terak [terramycin] Rash   Amoxicillin Diarrhea   Has patient had a PCN reaction causing immediate rash, facial/tongue/throat swelling, SOB or lightheadedness with hypotension: No Has patient had a PCN reaction causing severe rash involving mucus membranes or skin necrosis: No Has patient had a PCN reaction that required hospitalization: No Has patient had a PCN reaction occurring within the last 10 years: Unknown If all of the above answers are "NO", then may proceed with Cephalosporin use.      Medication List    TAKE these medications   buPROPion 300 MG 24 hr tablet Commonly known as: WELLBUTRIN XL Take 300 mg by mouth daily.   CANNABIDIOL PO Place 1 Dose under the tongue at bedtime. CBD OIL   cephALEXin 500 MG capsule Commonly known as: Keflex Take 1 capsule (500 mg total) by mouth 2 (two) times daily.   CITRACAL +D3 PO Take 1 tablet by mouth at bedtime.   Echinacea 450 MG Caps Take 450 mg by mouth daily.   fluticasone 50 MCG/ACT nasal spray Commonly known as: FLONASE Place 2 sprays into the nose daily.   ibuprofen 200 MG tablet Commonly known as: ADVIL Take 400 mg by mouth every 8 (eight) hours as needed (pain.).   letrozole 2.5 MG tablet Commonly known as: FEMARA Take 1 tablet (2.5 mg total) by mouth daily. What changed: when to take this   Lipitor 10 MG tablet Generic drug: atorvastatin Take 10 mg by mouth at bedtime.   lisinopril 10 MG tablet Commonly known as: ZESTRIL TAKE 1 TABLET BY MOUTH DAILY.   metoprolol tartrate 25 MG tablet Commonly known as: LOPRESSOR Take 25 mg 1  hour before Cardiac CT What changed:   how much to take  how to take this  when to take this  additional instructions   multivitamin with minerals Tabs tablet Take 1 tablet by mouth daily.   PROBIOTIC DAILY PO Take 1 capsule by mouth daily.   senna 8.6 MG tablet Commonly known as: SENOKOT Take 1-2 tablets by mouth daily as needed for constipation.   Synthroid 88 MCG tablet Generic drug: levothyroxine Take 88 mcg by mouth daily before breakfast.   tretinoin 0.025 % cream Commonly known as: RETIN-A Apply 1 application topically daily.   zolpidem 10 MG tablet Commonly known as: AMBIEN Take 2.5 mg by mouth at bedtime as needed for sleep.        Significant Diagnostic Studies:  Dg C-arm 1-60 Min-no Report  Result Date: 08/12/2019 Fluoroscopy was utilized by the requesting physician.  No radiographic interpretation.   Ir Ureteral Stent Left New Access W/o Sep Nephrostomy Cath  Result Date: 08/12/2019 INDICATION: Renal stones, access for left percutaneous nephrolithotomy. EXAM: 1. Percutaneous puncture of the renal collecting system under fluoroscopic guidance 2. Placement of a percutaneous nephrostomy tube Operating Physician:  Criselda Peaches, MD COMPARISON:  CT of the abdomen and pelvis-12/06/2018 MEDICATIONS: Ciprofloxacin 400 mg IV; The antibiotic was administered in an appropriate time frame prior to skin puncture. ANESTHESIA/SEDATION: Fentanyl 4 mcg IV; Versed 150 mg IV Moderate Sedation Time:  22 minutes The patient was continuously monitored during the procedure by  the interventional radiology nurse under my direct supervision. CONTRAST:  12 mL Omnipaque 300-administered into the collecting system(s) FLUOROSCOPY TIME:  Fluoroscopy Time: 2 minutes 18 seconds (17 mGy). COMPLICATIONS: None immediate. PROCEDURE: Informed written consent was obtained from the patient after a thorough discussion of the procedural risks, benefits and alternatives. All questions were  addressed. Maximal Sterile Barrier Technique was utilized including caps, mask, sterile gowns, sterile gloves, sterile drape, hand hygiene and skin antiseptic. A timeout was performed prior to the initiation of the procedure. A pre procedural spot fluoroscopic image was obtained of the upper abdomen. Ultrasound scanning performed of the kidney shows moderate hydronephrosis. As such, a posterior upper pole calyx was targeted with a Davis needle. Access to the collecting system was confirmed with advancement of a Nitrex wire into the collecting system. The needle was exchanged for the inner 3 French catheter from an [place] and contrast injection confirmed access. An Accustick set was utilized to dilate the tract and was subsequently exchanged for a Kumpe catheter over a Bentson wire. The Kumpe catheter was advanced down the ureter and into the urinary bladder. Postprocedural spot radiographs were obtained in various obliquities and the catheter was sutured to the skin. The catheter was capped and a dressing was placed. The patient tolerated the procedure well without immediate postprocedural complication. IMPRESSION: Successful fluoroscopic guided left percutaneous nephrostomy with placement of a 5 French Kumpe catheter to the level of the urinary bladder to be utilized during impending nephrolithotomy procedure. Electronically Signed   By: Jacqulynn Cadet M.D.   On: 08/12/2019 10:42    Brief H and P: For complete details please refer to admission H and P, but in brief patient is admitted for percutaneous management of a large renal stone  Hospital Course: The patient had no intraoperative or postoperative issues.  She was discharged home on the first postoperative day, tolerating a regular diet. Active Problems:   Kidney stone   Day of Discharge BP 113/69 (BP Location: Left Arm)   Pulse 65   Temp 97.9 F (36.6 C) (Oral)   Resp 16   Ht 5\' 5"  (1.651 m)   Wt 62.3 kg   SpO2 97%   BMI  22.86 kg/m   No results found for this or any previous visit (from the past 24 hour(s)).  Physical Exam: General: Alert and awake oriented x3 not in any acute distress. HEENT: anicteric sclera, pupils reactive to light and accommodation CVS: S1-S2 clear no murmur rubs or gallops Chest: clear to auscultation bilaterally, no wheezing rales or rhonchi Abdomen: soft nontender, nondistended, normal bowel sounds, no organomegaly Extremities: no cyanosis, clubbing or edema noted bilaterally Neuro: Cranial nerves II-XII intact, no focal neurological deficits  Disposition: Home  Diet: Regular  Activity: Discussed with patient   Disposition and Follow-up:   Follow-up arranged  TESTS THAT NEED FOLLOW-UP     DISCHARGE FOLLOW-UP  Follow-up Information    Jed Limerick, NP Follow up.   Specialty: Urology Why: we will call you to set up appt Contact information: [address] [place] [address] [phone]           Time spent on Discharge:  15 minutes  Signed: [place] 09/05/2019, 1:59 PM

## 2019-09-09 ENCOUNTER — Other Ambulatory Visit: Payer: Self-pay

## 2019-09-09 DIAGNOSIS — Z20822 Contact with and (suspected) exposure to covid-19: Secondary | ICD-10-CM

## 2019-09-09 DIAGNOSIS — R509 Fever, unspecified: Secondary | ICD-10-CM | POA: Diagnosis not present

## 2019-09-10 DIAGNOSIS — R300 Dysuria: Secondary | ICD-10-CM | POA: Diagnosis not present

## 2019-09-10 LAB — NOVEL CORONAVIRUS, NAA: SARS-CoV-2, NAA: NOT DETECTED

## 2019-10-04 DIAGNOSIS — Z853 Personal history of malignant neoplasm of breast: Secondary | ICD-10-CM | POA: Diagnosis not present

## 2019-10-04 DIAGNOSIS — R300 Dysuria: Secondary | ICD-10-CM | POA: Diagnosis not present

## 2019-10-04 DIAGNOSIS — D500 Iron deficiency anemia secondary to blood loss (chronic): Secondary | ICD-10-CM | POA: Diagnosis not present

## 2019-10-04 DIAGNOSIS — E7800 Pure hypercholesterolemia, unspecified: Secondary | ICD-10-CM | POA: Diagnosis not present

## 2019-10-04 DIAGNOSIS — I10 Essential (primary) hypertension: Secondary | ICD-10-CM | POA: Diagnosis not present

## 2019-10-04 DIAGNOSIS — C50919 Malignant neoplasm of unspecified site of unspecified female breast: Secondary | ICD-10-CM | POA: Diagnosis not present

## 2019-10-04 DIAGNOSIS — E039 Hypothyroidism, unspecified: Secondary | ICD-10-CM | POA: Diagnosis not present

## 2019-10-04 DIAGNOSIS — F329 Major depressive disorder, single episode, unspecified: Secondary | ICD-10-CM | POA: Diagnosis not present

## 2019-10-04 DIAGNOSIS — M170 Bilateral primary osteoarthritis of knee: Secondary | ICD-10-CM | POA: Diagnosis not present

## 2019-10-07 ENCOUNTER — Ambulatory Visit
Admission: RE | Admit: 2019-10-07 | Discharge: 2019-10-07 | Disposition: A | Discharge status: Home / Self Care | Payer: Medicare Other | Source: Ambulatory Visit | Attending: Family Medicine | Admitting: Family Medicine

## 2019-10-07 ENCOUNTER — Other Ambulatory Visit: Payer: Self-pay

## 2019-10-07 DIAGNOSIS — Z1231 Encounter for screening mammogram for malignant neoplasm of breast: Secondary | ICD-10-CM

## 2019-10-07 DIAGNOSIS — M8589 Other specified disorders of bone density and structure, multiple sites: Secondary | ICD-10-CM | POA: Diagnosis not present

## 2019-10-07 DIAGNOSIS — Z780 Asymptomatic menopausal state: Secondary | ICD-10-CM | POA: Diagnosis not present

## 2019-10-07 DIAGNOSIS — N200 Calculus of kidney: Secondary | ICD-10-CM | POA: Diagnosis not present

## 2019-10-07 DIAGNOSIS — M8580 Other specified disorders of bone density and structure, unspecified site: Secondary | ICD-10-CM

## 2019-10-09 ENCOUNTER — Other Ambulatory Visit: Payer: Self-pay | Admitting: Family Medicine

## 2019-10-09 DIAGNOSIS — M8588 Other specified disorders of bone density and structure, other site: Secondary | ICD-10-CM

## 2019-10-30 DIAGNOSIS — N3000 Acute cystitis without hematuria: Secondary | ICD-10-CM | POA: Diagnosis not present

## 2019-10-30 DIAGNOSIS — R310 Gross hematuria: Secondary | ICD-10-CM | POA: Diagnosis not present

## 2019-11-14 DIAGNOSIS — N3000 Acute cystitis without hematuria: Secondary | ICD-10-CM | POA: Diagnosis not present

## 2019-11-26 DIAGNOSIS — M792 Neuralgia and neuritis, unspecified: Secondary | ICD-10-CM | POA: Diagnosis not present

## 2019-11-26 DIAGNOSIS — D485 Neoplasm of uncertain behavior of skin: Secondary | ICD-10-CM | POA: Diagnosis not present

## 2019-11-26 DIAGNOSIS — Z23 Encounter for immunization: Secondary | ICD-10-CM | POA: Diagnosis not present

## 2019-11-26 DIAGNOSIS — D0471 Carcinoma in situ of skin of right lower limb, including hip: Secondary | ICD-10-CM | POA: Diagnosis not present

## 2019-11-26 DIAGNOSIS — C44722 Squamous cell carcinoma of skin of right lower limb, including hip: Secondary | ICD-10-CM | POA: Diagnosis not present

## 2019-11-27 ENCOUNTER — Other Ambulatory Visit: Payer: Self-pay | Admitting: Hematology and Oncology

## 2019-12-06 DIAGNOSIS — H43812 Vitreous degeneration, left eye: Secondary | ICD-10-CM | POA: Diagnosis not present

## 2019-12-06 DIAGNOSIS — H2513 Age-related nuclear cataract, bilateral: Secondary | ICD-10-CM | POA: Diagnosis not present

## 2019-12-06 DIAGNOSIS — H524 Presbyopia: Secondary | ICD-10-CM | POA: Diagnosis not present

## 2019-12-18 DIAGNOSIS — C50919 Malignant neoplasm of unspecified site of unspecified female breast: Secondary | ICD-10-CM | POA: Diagnosis not present

## 2019-12-18 DIAGNOSIS — Z853 Personal history of malignant neoplasm of breast: Secondary | ICD-10-CM | POA: Diagnosis not present

## 2019-12-18 DIAGNOSIS — M170 Bilateral primary osteoarthritis of knee: Secondary | ICD-10-CM | POA: Diagnosis not present

## 2019-12-18 DIAGNOSIS — D500 Iron deficiency anemia secondary to blood loss (chronic): Secondary | ICD-10-CM | POA: Diagnosis not present

## 2019-12-18 DIAGNOSIS — I10 Essential (primary) hypertension: Secondary | ICD-10-CM | POA: Diagnosis not present

## 2019-12-18 DIAGNOSIS — E7800 Pure hypercholesterolemia, unspecified: Secondary | ICD-10-CM | POA: Diagnosis not present

## 2019-12-18 DIAGNOSIS — F329 Major depressive disorder, single episode, unspecified: Secondary | ICD-10-CM | POA: Diagnosis not present

## 2019-12-18 DIAGNOSIS — E039 Hypothyroidism, unspecified: Secondary | ICD-10-CM | POA: Diagnosis not present

## 2019-12-21 ENCOUNTER — Ambulatory Visit: Payer: Medicare Other

## 2019-12-22 DIAGNOSIS — H6121 Impacted cerumen, right ear: Secondary | ICD-10-CM | POA: Diagnosis not present

## 2019-12-26 DIAGNOSIS — C44722 Squamous cell carcinoma of skin of right lower limb, including hip: Secondary | ICD-10-CM | POA: Diagnosis not present

## 2019-12-29 ENCOUNTER — Ambulatory Visit: Payer: Medicare Other | Attending: Internal Medicine

## 2019-12-29 DIAGNOSIS — Z23 Encounter for immunization: Secondary | ICD-10-CM

## 2019-12-29 NOTE — Progress Notes (Signed)
   Covid-19 Vaccination Clinic  Name:  [name]    MRN: [id number] DOB: 1946-01-30  12/29/2019  [name] was observed post Covid-19 immunization for 15 minutes without incidence. She was provided with Vaccine Information Sheet and instruction to access the V-Safe system.   [name] was instructed to call 911 with any severe reactions post vaccine: Marland Kitchen Difficulty breathing  . Swelling of your face and throat  . A fast heartbeat  . A bad rash all over your body  . Dizziness and weakness    Immunizations Administered    Name Date Dose VIS Date Route   Pfizer COVID-19 Vaccine 12/29/2019  3:56 PM 0.3 mL 11/01/2019 Intramuscular   Manufacturer: [place]   Lot: [id number]   [place]: [id number]

## 2020-01-10 DIAGNOSIS — T148XXA Other injury of unspecified body region, initial encounter: Secondary | ICD-10-CM | POA: Diagnosis not present

## 2020-01-10 DIAGNOSIS — Z4802 Encounter for removal of sutures: Secondary | ICD-10-CM | POA: Diagnosis not present

## 2020-01-10 DIAGNOSIS — L249 Irritant contact dermatitis, unspecified cause: Secondary | ICD-10-CM | POA: Diagnosis not present

## 2020-01-11 ENCOUNTER — Ambulatory Visit: Payer: Medicare Other

## 2020-01-15 ENCOUNTER — Telehealth: Payer: Self-pay | Admitting: Hematology and Oncology

## 2020-01-15 NOTE — Telephone Encounter (Signed)
Called pt per 2/23 sch message - called pt . No answer , left message for patient to call back to reschedule

## 2020-01-20 DIAGNOSIS — T148XXA Other injury of unspecified body region, initial encounter: Secondary | ICD-10-CM | POA: Diagnosis not present

## 2020-01-22 ENCOUNTER — Ambulatory Visit: Payer: Medicare Other | Admitting: Hematology and Oncology

## 2020-01-22 ENCOUNTER — Ambulatory Visit: Payer: Medicare Other

## 2020-01-22 ENCOUNTER — Ambulatory Visit: Payer: Medicare Other | Attending: Internal Medicine

## 2020-01-22 DIAGNOSIS — Z23 Encounter for immunization: Secondary | ICD-10-CM

## 2020-01-22 NOTE — Progress Notes (Signed)
   Covid-19 Vaccination Clinic  Name:  [name]    MRN: [id number] DOB: 10-14-1946  01/22/2020  [name] was observed post Covid-19 immunization for 15 minutes without incident. She was provided with Vaccine Information Sheet and instruction to access the V-Safe system.   [name] was instructed to call 911 with any severe reactions post vaccine: Marland Kitchen Difficulty breathing  . Swelling of face and throat  . A fast heartbeat  . A bad rash all over body  . Dizziness and weakness   Immunizations Administered    Name Date Dose VIS Date Route   Pfizer COVID-19 Vaccine 01/22/2020  4:13 PM 0.3 mL 11/01/2019 Intramuscular   Manufacturer: [place]   Lot: [id number]   [place]: [id number]

## 2020-01-27 NOTE — Progress Notes (Signed)
Patient Care Team: Michelle Fess, MD as PCP - General (Family Medicine) Michelle Breeding, MD as PCP - Cardiology (Cardiology) Michelle Lair, NP as [place] [facility]  DIAGNOSIS:    ICD-10-CM   1. Malignant neoplasm of upper-outer quadrant of right breast in female, estrogen receptor positive ([name])  C50.411    Z17.0     SUMMARY OF ONCOLOGIC HISTORY: Oncology History  Breast cancer of upper-outer quadrant of right female breast ([name])  04/23/2013 Initial Diagnosis   Right breast calcifications stereotactic biopsy revealed invasive lobular cancer with LCIS, lymph node benign, ER positive PR negative Ki-67 10%   06/11/2013 Surgery   Right breast lumpectomy: Lobular carcinoma in situ, no invasive disease found, sentinel nodes negative   08/06/2013 - 09/04/2013 Radiation Therapy   Adjuvant radiation therapy   10/03/2013 -  Anti-estrogen oral therapy   Tamoxifen 20 mg daily, switched to letrozole 10/15/18 due to PE     CHIEF COMPLIANT: Follow-up of right breast cancer on letrozole  INTERVAL HISTORY: [name] is a 74 y.o. with above-mentioned history of right breast cancer who is currently on antiestrogen therapy with letrozole. Mammogram on 10/07/19 showed no evidence of malignancy bilaterally. Bone density scan on 10/07/19 showed osteopenia with a T-score of -2.4 at the forearm radius. She presents to the clinic today for annual follow-up.   ALLERGIES:  is allergic to dilaudid [hydromorphone hcl]; terak [terramycin]; and amoxicillin.  MEDICATIONS:  Current Outpatient Medications  Medication Sig Dispense Refill  . atorvastatin (LIPITOR) 10 MG tablet Take 10 mg by mouth at bedtime.     Marland Kitchen buPROPion (WELLBUTRIN XL) 300 MG 24 hr tablet Take 300 mg by mouth daily.    . Calcium-Phosphorus-Vitamin D (CITRACAL +D3 PO) Take 1 tablet by mouth at bedtime.    Marland Kitchen CANNABIDIOL PO Place 1 Dose under the tongue at bedtime. CBD OIL    . Echinacea 450 MG CAPS Take 450  mg by mouth daily.    . fluticasone (FLONASE) 50 MCG/ACT nasal spray Place 2 sprays into the nose daily.     Marland Kitchen ibuprofen (ADVIL) 200 MG tablet Take 400 mg by mouth every 8 (eight) hours as needed (pain.).    Marland Kitchen letrozole (FEMARA) 2.5 MG tablet Take 1 tablet (2.5 mg total) by mouth daily. 90 tablet 3  . metoprolol tartrate (LOPRESSOR) 25 MG tablet Take 0.5 tablets (12.5 mg total) by mouth 2 (two) times daily.    . Multiple Vitamin (MULTIVITAMIN WITH MINERALS) TABS tablet Take 1 tablet by mouth daily.    . Probiotic Product (PROBIOTIC DAILY PO) Take 1 capsule by mouth daily.    Marland Kitchen senna (SENOKOT) 8.6 MG tablet Take 1-2 tablets by mouth daily as needed for constipation.    Marland Kitchen SYNTHROID 88 MCG tablet Take 88 mcg by mouth daily before breakfast.     . zolpidem (AMBIEN) 10 MG tablet Take 2.5 mg by mouth at bedtime as needed for sleep.      No current facility-administered medications for this visit.    PHYSICAL EXAMINATION: ECOG PERFORMANCE STATUS: 1 - Symptomatic but completely ambulatory  Vitals:   01/28/20 1528  BP: 128/81  Pulse: (!) 53  Resp: 17  Temp: 98.9 F (37.2 C)  SpO2: 100%   Filed Weights   01/28/20 1528  Weight: 137 lb 14.4 oz (62.6 kg)    BREAST: No palpable masses or nodules in either right or left breasts. No palpable axillary supraclavicular or infraclavicular adenopathy no breast tenderness or nipple discharge. (exam  performed in the presence of a chaperone)  LABORATORY DATA:  I have reviewed the data as listed CMP Latest Ref Rng & Units 08/07/2019 10/03/2018 08/29/2018  Glucose 70 - 99 mg/dL 87 98 73  BUN 8 - 23 mg/dL '16 12 8  '$ Creatinine 0.44 - 1.00 mg/dL 0.90 0.83 0.51  Sodium 135 - 145 mmol/L 139 133(L) 136  Potassium 3.5 - 5.1 mmol/L 4.6 3.9 4.6  Chloride 98 - 111 mmol/L 104 100 108  CO2 22 - 32 mmol/L 29 25 19(L)  Calcium 8.9 - 10.3 mg/dL 9.5 8.8(L) 7.8(L)  Total Protein 6.5 - 8.1 g/dL - - -  Total Bilirubin 0.3 - 1.2 mg/dL - - -  Alkaline Phos 38 - 126 U/L  - - -  AST 15 - 41 U/L - - -  ALT 0 - 44 U/L - - -    Lab Results  Component Value Date   WBC 4.4 08/07/2019   HGB 11.1 (L) 08/13/2019   HCT 36.7 08/13/2019   MCV 88.5 08/07/2019   PLT 296 08/07/2019   NEUTROABS 11.0 (H) 10/03/2018    ASSESSMENT & PLAN:  Breast cancer of upper-outer quadrant of right female breast Right breast invasive lobular cancer with LCIS T1 N0 M0 stage IA status post lumpectomy which did not show any further evidence of invasive breast cancer. ER positive PR positive HER-2 negative, received adjuvant radiation completed 09/04/2013, started her tamoxifen 20 mg daily from November 2013-10/15/2018, switched to letrozole  Letrozole toxicities: Denies any hot flashes or significant increase in arthralgias or myalgias. Continue letrozole for total of 5 years  Breast cancer surveillance: 1. Breast exam3/9/2021does not reveal any palpable lumps or nodules of concern. 2. Mammogram11/17/2020: Benign, breast density categoryC 3. Breast MRI8/03/2018: Negative, we do not plan to do regular MRIs at this time  CT chest 10/03/2018: Small volume pulmonary emboli noted right lower lobe, small right pleural effusion and airspace opacity likely due to pulmonary infarction Risk factors: Knee replacement surgery and being on tamoxifen Current treatment: Xareltofor 6 months started November 2019, completed May 2020   Return to clinic in 1 year for follow-up    No orders of the defined types were placed in this encounter.  The patient has a good understanding of the overall plan. she agrees with it. she will call with any problems that may develop before the next visit here.  Total time spent: 20 mins including face to face time and time spent for planning, charting and coordination of care  Michelle Lose, MD 01/28/2020  I, [name], am acting as scribe for Dr. Nicholas Bautista.  I have reviewed the above documentation for accuracy and completeness, and I  agree with the above.

## 2020-01-28 ENCOUNTER — Other Ambulatory Visit: Payer: Self-pay

## 2020-01-28 ENCOUNTER — Inpatient Hospital Stay: Payer: Medicare Other | Attending: Hematology and Oncology | Admitting: Hematology and Oncology

## 2020-01-28 DIAGNOSIS — Z79811 Long term (current) use of aromatase inhibitors: Secondary | ICD-10-CM | POA: Insufficient documentation

## 2020-01-28 DIAGNOSIS — M8580 Other specified disorders of bone density and structure, unspecified site: Secondary | ICD-10-CM | POA: Diagnosis not present

## 2020-01-28 DIAGNOSIS — Z791 Long term (current) use of non-steroidal anti-inflammatories (NSAID): Secondary | ICD-10-CM | POA: Insufficient documentation

## 2020-01-28 DIAGNOSIS — Z170 Estrogen receptor positive status [ER+]: Secondary | ICD-10-CM | POA: Insufficient documentation

## 2020-01-28 DIAGNOSIS — C50411 Malignant neoplasm of upper-outer quadrant of right female breast: Secondary | ICD-10-CM | POA: Diagnosis not present

## 2020-01-28 DIAGNOSIS — Z79899 Other long term (current) drug therapy: Secondary | ICD-10-CM | POA: Insufficient documentation

## 2020-01-28 DIAGNOSIS — Z923 Personal history of irradiation: Secondary | ICD-10-CM | POA: Diagnosis not present

## 2020-01-28 DIAGNOSIS — Z86711 Personal history of pulmonary embolism: Secondary | ICD-10-CM | POA: Diagnosis not present

## 2020-01-28 MED ORDER — LETROZOLE 2.5 MG PO TABS: 2.5000 mg | ORAL_TABLET | Freq: Every day | ORAL | 3 refills | Status: DC

## 2020-01-28 MED ORDER — METOPROLOL TARTRATE 25 MG PO TABS: 12.5000 mg | ORAL_TABLET | Freq: Two times a day (BID) | ORAL | Status: DC

## 2020-01-28 NOTE — Assessment & Plan Note (Signed)
Right breast invasive lobular cancer with LCIS T1 N0 M0 stage IA status post lumpectomy which did not show any further evidence of invasive breast cancer. ER positive PR positive HER-2 negative, received adjuvant radiation completed 09/04/2013, started her tamoxifen 20 mg daily from November 2013-10/15/2018, switched to letrozole  Letrozole toxicities: Denies any hot flashes or significant increase in arthralgias or myalgias. Continue letrozole for 5 years  Breast cancer surveillance: 1. Breast exam3/9/2021does not reveal any palpable lumps or nodules of concern. 2. Mammogram11/17/2020: Benign, breast density categoryC 3. Breast MRI8/03/2018: Negative, plan to do MRIs every other year  CT chest 10/03/2018: Small volume pulmonary emboli noted right lower lobe, small right pleural effusion and airspace opacity likely due to pulmonary infarction Risk factors: Knee replacement surgery and being on tamoxifen Current treatment: Xareltofor 6 months started November 2019, completed May 2020   Return to clinic in 1 year for follow-up

## 2020-01-29 DIAGNOSIS — E039 Hypothyroidism, unspecified: Secondary | ICD-10-CM | POA: Diagnosis not present

## 2020-01-29 DIAGNOSIS — F329 Major depressive disorder, single episode, unspecified: Secondary | ICD-10-CM | POA: Diagnosis not present

## 2020-01-29 DIAGNOSIS — M170 Bilateral primary osteoarthritis of knee: Secondary | ICD-10-CM | POA: Diagnosis not present

## 2020-01-29 DIAGNOSIS — Z853 Personal history of malignant neoplasm of breast: Secondary | ICD-10-CM | POA: Diagnosis not present

## 2020-01-29 DIAGNOSIS — D500 Iron deficiency anemia secondary to blood loss (chronic): Secondary | ICD-10-CM | POA: Diagnosis not present

## 2020-01-29 DIAGNOSIS — E7800 Pure hypercholesterolemia, unspecified: Secondary | ICD-10-CM | POA: Diagnosis not present

## 2020-01-29 DIAGNOSIS — I10 Essential (primary) hypertension: Secondary | ICD-10-CM | POA: Diagnosis not present

## 2020-01-29 DIAGNOSIS — C50919 Malignant neoplasm of unspecified site of unspecified female breast: Secondary | ICD-10-CM | POA: Diagnosis not present

## 2020-02-10 ENCOUNTER — Telehealth: Payer: Self-pay | Admitting: Hematology and Oncology

## 2020-02-10 NOTE — Telephone Encounter (Signed)
Scheduled per los, called patient regarding upcoming appointment. Left a voicemail.

## 2020-02-20 ENCOUNTER — Other Ambulatory Visit: Payer: Self-pay

## 2020-02-20 ENCOUNTER — Encounter: Payer: Self-pay | Admitting: Orthopaedic Surgery

## 2020-02-20 ENCOUNTER — Ambulatory Visit: Payer: Self-pay

## 2020-02-20 ENCOUNTER — Ambulatory Visit (INDEPENDENT_AMBULATORY_CARE_PROVIDER_SITE_OTHER): Payer: Medicare Other | Admitting: Orthopaedic Surgery

## 2020-02-20 VITALS — Ht 65.5 in | Wt 131.0 lb

## 2020-02-20 DIAGNOSIS — M25561 Pain in right knee: Secondary | ICD-10-CM | POA: Diagnosis not present

## 2020-02-20 DIAGNOSIS — Z96651 Presence of right artificial knee joint: Secondary | ICD-10-CM | POA: Diagnosis not present

## 2020-02-20 NOTE — Progress Notes (Signed)
Office Visit Note   Patient: [name]           Date of Birth: 1946/03/27           MRN: [id number] Visit Date: 02/20/2020              Requested by: Hulan Fess, MD [place],  [place] [address] PCP: Hulan Fess, MD   Assessment & Plan: Visit Diagnoses:  1. History of total right knee replacement   2. Acute pain of right knee     Plan: Insidious onset of right knee pain 4 to 5 days ago.  No fever or chills.  No injury.  Exam is relatively benign without evidence of increased warmth or heat.  Full extension.flexed over 100 degrees.  No instability.  Little bit of tenderness along the lateral patella and tibia but the patella appears to track in the midline.  Could have a very small effusion but no loss of motion.  Films were negative.  I have suggested Voltaren gel and time.  Like to see her back in 2 weeks if no improvement  Follow-Up Instructions: No follow-ups on file.   Orders:  Orders Placed This Encounter  Procedures  . XR KNEE 3 VIEW RIGHT   No orders of the defined types were placed in this encounter.     Procedures: No procedures performed   Clinical Data: No additional findings.   Subjective: Chief Complaint  Patient presents with  . Right Knee - Pain  Patient presents today for follow up on her right knee. She had a right total knee arthroplasty on 08/28/2018. She said that her knee has been doing well until about 4-5 days ago. She said that her knee feels stiff and has been having pain anteriorly. She has a difficult time with stairs. She also states that her knee knees numb. She has been taking Ibuprofen as needed. Icing does not help. No known injury.   HPI  Review of Systems  Constitutional: Negative for fatigue.  HENT: Negative for ear pain.   Eyes: Negative for pain.  Respiratory: Negative for shortness of breath.   Cardiovascular: Negative for leg swelling.  Gastrointestinal: Negative for constipation and diarrhea.    Endocrine: Negative for cold intolerance and heat intolerance.  Genitourinary: Negative for difficulty urinating.  Musculoskeletal: Positive for joint swelling.  Skin: Negative for rash.  Allergic/Immunologic: Negative for food allergies.  Neurological: Negative for weakness.  Hematological: Does not bruise/bleed easily.  Psychiatric/Behavioral: Negative for sleep disturbance.     Objective: Vital Signs: Ht 5' 5.5" (1.664 m)   Wt 131 lb (59.4 kg)   BMI 21.47 kg/m   Physical Exam Constitutional:      Appearance: She is well-developed.  Eyes:     Pupils: Pupils are equal, round, and reactive to light.  Pulmonary:     Effort: Pulmonary effort is normal.  Skin:    General: Skin is warm and dry.  Neurological:     Mental Status: She is alert and oriented to person, place, and time.  Psychiatric:        Behavior: Behavior normal.     Ortho Exam right knee might have a very minimal effusion.  Little bit of tenderness on the lateral patella but patella tracks in the midline.  Full extension quickly.  Flexed over 100 degrees without instability.  No calf pain.  No popliteal mass or pain.  No distal edema.  No increased heat or redness  Specialty Comments:  No specialty comments available.  Imaging: No results found.   PMFS History: Patient Active Problem List   Diagnosis Date Noted  . Pain in right knee 02/20/2020  . Kidney stone 08/12/2019  . Hypothyroidism 10/03/2018  . PE (pulmonary thromboembolism) ([facility]) 10/03/2018  . Chronic diastolic CHF (congestive heart failure) ([facility]) 10/03/2018  . Macrocytic anemia 10/03/2018  . Unilateral primary osteoarthritis, right knee 08/28/2018  . History of total right knee replacement 08/28/2018  . Bilateral primary osteoarthritis of knee 03/12/2018  . Essential hypertension 08/26/2016  . Mitral valve disorder 08/26/2016  . History of palpitations 08/26/2016  . Dyslipidemia 08/26/2016  . Internal hemorrhoids 08/03/2015  . Rectal  bleeding 08/03/2015  . Hx of radiation therapy   . Breast cancer of upper-outer quadrant of right female breast ([facility]) 05/23/2013  . Personal history of colonic polyps 01/06/2012  . Irritable bowel syndrome - constipation predominant 01/06/2012   Past Medical History:  Diagnosis Date  . Adenomatous colon polyp 03/07/2012  . Allergic rhinitis   . Arthritis    "knees" (08/28/2018)  . Breast cancer, right breast ([facility]) 05/20/13   "no chemo" (08/28/2018)  . Chronic lower back pain   . Diverticulosis   . DOE (dyspnea on exertion)   . Graves' disease    2011  . Heart palpitations   . History of kidney stones   . HLD (hyperlipidemia)   . Hx of radiation therapy 08/13/13- 09/06/13   right breast 4250 cGy 17 sessions  . Hypertension   . IBS (irritable bowel syndrome)   . Lumbar herniated disc    L4-L5  . MVP (mitral valve prolapse)   . Osteopenia   . Personal history of radiation therapy   . Pneumonia 08/2019  . PONV (postoperative nausea and vomiting)    "when I have general anesthetic" (08/28/2018)  . Pulmonary emboli ([facility])    after knee replacement  . PVC (premature ventricular contraction)    Takes metoprolol PRN for  pvc's    Family History  Problem Relation Age of Onset  . Heart disease Mother 57       MI  . Lung cancer Mother   . Hypertension Mother   . Alcohol abuse Father   . COPD Father   . Colon cancer Maternal Grandfather 38  . Diabetes Paternal Grandmother   . Breast cancer Maternal Aunt   . Pancreatic cancer Maternal Aunt   . Stomach cancer Neg Hx     Past Surgical History:  Procedure Laterality Date  . BREAST BIOPSY Right 2014  . BREAST EXCISIONAL BIOPSY Right   . BREAST LUMPECTOMY WITH NEEDLE LOCALIZATION AND AXILLARY SENTINEL LYMPH NODE BX Right 06/11/2013   Procedure: BREAST LUMPECTOMY WITH NEEDLE LOCALIZATION AND AXILLARY SENTINEL LYMPH NODE BX;  Surgeon: Harl Bowie, MD;  Location: [facility];  Service: General;  Laterality: Right;  Needle localization  BCG 7:30  . COLONOSCOPY W/ BIOPSIES AND POLYPECTOMY  multiple  . CYSTOSCOPY WITH STENT PLACEMENT Left 08/15/2019   Procedure: CYSTOSCOPY, left retrograde pyleogram  WITH STENT EXCHANGE left and foley placement;  Surgeon: Franchot Gallo, MD;  Location: [facility];  Service: Urology;  Laterality: Left;  . IR URETERAL STENT LEFT NEW ACCESS W/O SEP NEPHROSTOMY CATH  08/12/2019  . KNEE ARTHROSCOPY Left 2006  . NEPHROLITHOTOMY Left 08/12/2019   Procedure: NEPHROLITHOTOMY PERCUTANEOUS/INSERTION DOUBLE J STENT;  Surgeon: Franchot Gallo, MD;  Location: [facility];  Service: Urology;  Laterality: Left;  2 HRS  . TOTAL KNEE ARTHROPLASTY Right 08/28/2018   Procedure:  RIGHT TOTAL KNEE ARTHROPLASTY;  Surgeon: Garald Balding, MD;  Location: [facility];  Service: Orthopedics;  Laterality: Right;   Social History   Occupational History  . Occupation: Programmer, multimedia: UNEMPLOYED  Tobacco Use  . Smoking status: Never Smoker  . Smokeless tobacco: Never Used  Substance and Sexual Activity  . Alcohol use: Yes    Alcohol/week: 0.0 standard drinks    Comment: 08/28/2018 "1-2 drinks/month"  . Drug use: Never  . Sexual activity: Not Currently    Birth control/protection: Post-menopausal    Comment: menarche 89, 23 1 age 45, menopause 26, HRT x 9 yrs

## 2020-02-25 ENCOUNTER — Ambulatory Visit: Payer: Medicare Other | Admitting: Orthopaedic Surgery

## 2020-03-05 ENCOUNTER — Ambulatory Visit: Payer: Medicare Other | Admitting: Orthopaedic Surgery

## 2020-04-07 DIAGNOSIS — R0602 Shortness of breath: Secondary | ICD-10-CM | POA: Insufficient documentation

## 2020-04-07 DIAGNOSIS — R002 Palpitations: Secondary | ICD-10-CM | POA: Insufficient documentation

## 2020-04-07 DIAGNOSIS — I340 Nonrheumatic mitral (valve) insufficiency: Secondary | ICD-10-CM | POA: Insufficient documentation

## 2020-04-07 DIAGNOSIS — Z7189 Other specified counseling: Secondary | ICD-10-CM | POA: Insufficient documentation

## 2020-04-07 NOTE — Progress Notes (Signed)
Cardiology Office Note   Date:  04/08/2020   ID:  [name], DOB 1946/05/11, MRN [id number]  PCP:  Hulan Fess, MD  Cardiologist:   Minus Breeding, MD  Chief Complaint  Patient presents with  . Palpitations      History of Present Illness: [name] is a 74 y.o. female who I saw previously for evaluation of palpitations she had diagonal stenosis when noted on a CT that was done in [place] when she was living there.  She had a normal echo . I saw her 13 years ago and she was having palpitations. She does have mitral valve prolapse. However, nothing significant was noted and she didn't have any problems until she developed Graves' disease 2011. She again had palpitations. She was living in [place] and she had a workup by a cardiologist there. Coronary CT demonstrated some diagonal stenosis but she doesn't know of any other disease. She had a treadmill test which apparently was negative.   She had a normal echo in 2019.  She had a POET (Plain Old Exercise Treadmill) which was negative in 2020.  She had a PE after knee replacement.    Since I last saw her she is not bothered by the palpitations like she was previously.  She feels well.  She did have an episode of back pain about a month ago.  She woke with this.  She is not had this before.  Was severe.  She took some aspirin and it went away.  She has not had any chest pressure, neck or arm discomfort.  She has had no new shortness of breath, PND or orthopnea.  She had no weight gain or edema.   Past Medical History:  Diagnosis Date  . Adenomatous colon polyp 03/07/2012  . Allergic rhinitis   . Arthritis    "knees" (08/28/2018)  . Breast cancer, right breast ([facility]) 05/20/13   "no chemo" (08/28/2018)  . Chronic lower back pain   . Diverticulosis   . DOE (dyspnea on exertion)   . Graves' disease    2011  . Heart palpitations   . History of kidney stones   . HLD (hyperlipidemia)   . Hx of radiation therapy  08/13/13- 09/06/13   right breast 4250 cGy 17 sessions  . Hypertension   . IBS (irritable bowel syndrome)   . Lumbar herniated disc    L4-L5  . MVP (mitral valve prolapse)   . Osteopenia   . Personal history of radiation therapy   . Pneumonia 08/2019  . PONV (postoperative nausea and vomiting)    "when I have general anesthetic" (08/28/2018)  . Pulmonary emboli ([facility])    after knee replacement  . PVC (premature ventricular contraction)    Takes metoprolol PRN for  pvc's    Past Surgical History:  Procedure Laterality Date  . BREAST BIOPSY Right 2014  . BREAST EXCISIONAL BIOPSY Right   . BREAST LUMPECTOMY WITH NEEDLE LOCALIZATION AND AXILLARY SENTINEL LYMPH NODE BX Right 06/11/2013   Procedure: BREAST LUMPECTOMY WITH NEEDLE LOCALIZATION AND AXILLARY SENTINEL LYMPH NODE BX;  Surgeon: Harl Bowie, MD;  Location: [facility];  Service: General;  Laterality: Right;  Needle localization BCG 7:30  . COLONOSCOPY W/ BIOPSIES AND POLYPECTOMY  multiple  . CYSTOSCOPY WITH STENT PLACEMENT Left 08/15/2019   Procedure: CYSTOSCOPY, left retrograde pyleogram  WITH STENT EXCHANGE left and foley placement;  Surgeon: Franchot Gallo, MD;  [facility];  Service: Urology;  Laterality: Left;  .  IR URETERAL STENT LEFT NEW ACCESS W/O SEP NEPHROSTOMY CATH  08/12/2019  . KNEE ARTHROSCOPY Left 2006  . NEPHROLITHOTOMY Left 08/12/2019   Procedure: NEPHROLITHOTOMY PERCUTANEOUS/INSERTION DOUBLE J STENT;  Surgeon: Franchot Gallo, MD;  [facility];  Service: Urology;  Laterality: Left;  2 HRS  . TOTAL KNEE ARTHROPLASTY Right 08/28/2018   Procedure: RIGHT TOTAL KNEE ARTHROPLASTY;  Surgeon: Garald Balding, MD;  Location: [facility];  Service: Orthopedics;  Laterality: Right;     Current Outpatient Medications  Medication Sig Dispense Refill  . atorvastatin (LIPITOR) 10 MG tablet Take 10 mg by mouth at bedtime.     Marland Kitchen buPROPion (WELLBUTRIN XL) 300 MG 24 hr tablet Take 300 mg by mouth daily.    .  Calcium-Phosphorus-Vitamin D (CITRACAL +D3 PO) Take 1 tablet by mouth at bedtime.    Marland Kitchen CANNABIDIOL PO Place 1 Dose under the tongue at bedtime. CBD OIL    . Echinacea 450 MG CAPS Take 450 mg by mouth daily.    . fluticasone (FLONASE) 50 MCG/ACT nasal spray Place 2 sprays into the nose daily.     Marland Kitchen ibuprofen (ADVIL) 200 MG tablet Take 400 mg by mouth every 8 (eight) hours as needed (pain.).    Marland Kitchen letrozole (FEMARA) 2.5 MG tablet Take 1 tablet (2.5 mg total) by mouth daily. 90 tablet 3  . metoprolol tartrate (LOPRESSOR) 25 MG tablet Take 0.5 tablets (12.5 mg total) by mouth 2 (two) times daily.    . Multiple Vitamin (MULTIVITAMIN WITH MINERALS) TABS tablet Take 1 tablet by mouth daily.    . Probiotic Product (PROBIOTIC DAILY PO) Take 1 capsule by mouth daily.    Marland Kitchen senna (SENOKOT) 8.6 MG tablet Take 1-2 tablets by mouth daily as needed for constipation.    Marland Kitchen SYNTHROID 88 MCG tablet Take 88 mcg by mouth daily before breakfast.     . zolpidem (AMBIEN) 10 MG tablet Take 2.5 mg by mouth at bedtime as needed for sleep.      No current facility-administered medications for this visit.    Allergies:   Dilaudid [hydromorphone hcl], Terak [terramycin], and Amoxicillin    ROS:  Please see the history of present illness.   Otherwise, review of systems are positive for none.   All other systems are reviewed and negative.    PHYSICAL EXAM: VS:  BP 110/70   Pulse (!) 59   Temp (!) 97.3 F (36.3 C)   Ht 5\' 5"  (1.651 m)   Wt 135 lb 9.6 oz (61.5 kg)   SpO2 97%   BMI 22.57 kg/m  , BMI Body mass index is 22.57 kg/m. GENERAL:  Well appearing NECK:  No jugular venous distention, waveform within normal limits, carotid upstroke brisk and symmetric, no bruits, no thyromegaly LUNGS:  Clear to auscultation bilaterally CHEST:  Unremarkable HEART:  PMI not displaced or sustained,S1 and S2 within normal limits, no S3, no S4, no clicks, no rubs, no murmurs ABD:  Flat, positive bowel sounds normal in frequency in  pitch, no bruits, no rebound, no guarding, no midline pulsatile mass, no hepatomegaly, no splenomegaly EXT:  2 plus pulses throughout, no edema, no cyanosis no clubbing    EKG:  EKG is ordered today. The ekg ordered today demonstrates sinus rhythm, rate 59, axis within normal limits.  Intervals within normal limits, no acute ST-T wave changes.   Recent Labs: 08/07/2019: BUN 16; Creatinine, Ser 0.90; Platelets 296; Potassium 4.6; Sodium 139 08/13/2019: Hemoglobin 11.1    Lipid Panel No  results found for: CHOL, TRIG, HDL, CHOLHDL, VLDL, LDLCALC, LDLDIRECT    Wt Readings from Last 3 Encounters:  04/08/20 135 lb 9.6 oz (61.5 kg)  02/20/20 131 lb (59.4 kg)  01/28/20 137 lb 14.4 oz (62.6 kg)      Other studies Reviewed: Additional studies/ records that were reviewed today include: None. Review of the above records demonstrates:  NA   ASSESSMENT AND PLAN:   SOB The patient is no longer having shortness of breath.  She thinks this is related to palpitations which have resolved.  No change in therapy.   Mitral valve disorder This was mild on echo previously.  I do not hear any mitral regurgitation.  No further imaging.  This can be followed clinically.  History of palpitations This is improved as above.  She will continue with the low-dose beta-blocker.  Dyslipidemia I have encouraged her to get this checked.  She is taking a low-dose statin and had an excellent ratio but she has not had it repeated in about a year.   Covid education She has had her vaccine.  Current medicines are reviewed at length with the patient today.  The patient does not have concerns regarding medicines.  The following changes have been made:  no change  Labs/ tests ordered today include: None  Orders Placed This Encounter  Procedures  . EKG 12-Lead     Disposition:   FU with me in one year.     Signed, Minus Breeding, MD  04/08/2020 3:56 PM    [place]

## 2020-04-08 ENCOUNTER — Encounter: Payer: Self-pay | Admitting: Cardiology

## 2020-04-08 ENCOUNTER — Ambulatory Visit (INDEPENDENT_AMBULATORY_CARE_PROVIDER_SITE_OTHER): Payer: Medicare Other | Admitting: Cardiology

## 2020-04-08 ENCOUNTER — Other Ambulatory Visit: Payer: Self-pay

## 2020-04-08 VITALS — BP 110/70 | HR 59 | Temp 97.3°F | Ht 65.0 in | Wt 135.6 lb

## 2020-04-08 DIAGNOSIS — Z7189 Other specified counseling: Secondary | ICD-10-CM | POA: Diagnosis not present

## 2020-04-08 DIAGNOSIS — R0602 Shortness of breath: Secondary | ICD-10-CM | POA: Diagnosis not present

## 2020-04-08 DIAGNOSIS — I340 Nonrheumatic mitral (valve) insufficiency: Secondary | ICD-10-CM

## 2020-04-08 DIAGNOSIS — R002 Palpitations: Secondary | ICD-10-CM | POA: Diagnosis not present

## 2020-04-08 DIAGNOSIS — E785 Hyperlipidemia, unspecified: Secondary | ICD-10-CM | POA: Diagnosis not present

## 2020-04-08 NOTE — Patient Instructions (Signed)

## 2020-05-22 DIAGNOSIS — I10 Essential (primary) hypertension: Secondary | ICD-10-CM | POA: Diagnosis not present

## 2020-05-22 DIAGNOSIS — M85839 Other specified disorders of bone density and structure, unspecified forearm: Secondary | ICD-10-CM | POA: Diagnosis not present

## 2020-06-16 DIAGNOSIS — M170 Bilateral primary osteoarthritis of knee: Secondary | ICD-10-CM | POA: Diagnosis not present

## 2020-06-16 DIAGNOSIS — E039 Hypothyroidism, unspecified: Secondary | ICD-10-CM | POA: Diagnosis not present

## 2020-06-16 DIAGNOSIS — E7800 Pure hypercholesterolemia, unspecified: Secondary | ICD-10-CM | POA: Diagnosis not present

## 2020-06-16 DIAGNOSIS — C50919 Malignant neoplasm of unspecified site of unspecified female breast: Secondary | ICD-10-CM | POA: Diagnosis not present

## 2020-06-16 DIAGNOSIS — D500 Iron deficiency anemia secondary to blood loss (chronic): Secondary | ICD-10-CM | POA: Diagnosis not present

## 2020-06-16 DIAGNOSIS — F329 Major depressive disorder, single episode, unspecified: Secondary | ICD-10-CM | POA: Diagnosis not present

## 2020-06-16 DIAGNOSIS — Z853 Personal history of malignant neoplasm of breast: Secondary | ICD-10-CM | POA: Diagnosis not present

## 2020-06-16 DIAGNOSIS — I10 Essential (primary) hypertension: Secondary | ICD-10-CM | POA: Diagnosis not present

## 2020-07-10 ENCOUNTER — Other Ambulatory Visit: Payer: Self-pay | Admitting: Sleep Medicine

## 2020-07-10 ENCOUNTER — Other Ambulatory Visit: Payer: Medicare Other

## 2020-07-10 DIAGNOSIS — Z20822 Contact with and (suspected) exposure to covid-19: Secondary | ICD-10-CM | POA: Diagnosis not present

## 2020-07-11 LAB — NOVEL CORONAVIRUS, NAA: SARS-CoV-2, NAA: NOT DETECTED

## 2020-07-11 LAB — SPECIMEN STATUS REPORT

## 2020-07-11 LAB — SARS-COV-2, NAA 2 DAY TAT

## 2020-07-16 DIAGNOSIS — E039 Hypothyroidism, unspecified: Secondary | ICD-10-CM | POA: Diagnosis not present

## 2020-07-24 ENCOUNTER — Telehealth: Payer: Self-pay | Admitting: Internal Medicine

## 2020-07-24 MED ORDER — PANTOPRAZOLE SODIUM 40 MG PO TBEC: 40.0000 mg | DELAYED_RELEASE_TABLET | Freq: Every day | ORAL | 1 refills | Status: DC

## 2020-07-24 NOTE — Telephone Encounter (Signed)
Patient not seen since 2018. Please advise Sir?

## 2020-07-24 NOTE — Telephone Encounter (Signed)
Spoke with [name] and she said she has tried the IBgard in the past without success. She will get the pantoprazole and use it until her appointment.

## 2020-07-24 NOTE — Telephone Encounter (Signed)
She has appt w/ Jaclyn Shaggy in a month so pantoprazole 40 mg daily # 30 1 refill ok  Also she could try IB Gard for bloating (OTC)

## 2020-08-10 DIAGNOSIS — L821 Other seborrheic keratosis: Secondary | ICD-10-CM | POA: Diagnosis not present

## 2020-08-10 DIAGNOSIS — Z85828 Personal history of other malignant neoplasm of skin: Secondary | ICD-10-CM | POA: Diagnosis not present

## 2020-08-10 DIAGNOSIS — L814 Other melanin hyperpigmentation: Secondary | ICD-10-CM | POA: Diagnosis not present

## 2020-08-10 DIAGNOSIS — L578 Other skin changes due to chronic exposure to nonionizing radiation: Secondary | ICD-10-CM | POA: Diagnosis not present

## 2020-08-10 DIAGNOSIS — D225 Melanocytic nevi of trunk: Secondary | ICD-10-CM | POA: Diagnosis not present

## 2020-08-10 DIAGNOSIS — L570 Actinic keratosis: Secondary | ICD-10-CM | POA: Diagnosis not present

## 2020-08-26 NOTE — Progress Notes (Signed)
08/26/2020 [name] [id number] July 26, 1946   Chief Complaint: Heartburn and abdominal bloat   History of Present Illness: [name] is a 74 year old female with a past medical history of breast cancer s/p right breast lumpectomy and radiation 2014, palpitations, MVP, PVCs, Grave's disease, PE s/p knee replacement surgery 2019, kidney stones, arthritis and colon polyps. She is followed by Dr. Carlean Purl. She presents today with complaints of heartburn and abdominal bloating. She stated her heartburn is a new symptom, never had it before. She describes having 2 or 3 episodes of upper back pain which radiates to below her right and left rib cage "vice like" feeling which has occurred over the past 2 months. The first episode occurred about 2 months ago in the evening a few hours after eating dinner (she does not recall what she ate). She first feels upper back pain between the shoulder blades which radiates across to below her rib cage and into her chest which lasted for about 1 hour.  No N/V. No associated palpitation, dizziness or SOB. The most recent episode occurred 3 weeks ago in the evening, similar pattern of upper back pain which radiated to her right and left rib cage but did not radiate into her chest. These symptoms lasted for about one hour. No N/V. She went to bed and she had a sore throat all night long. In the am, her sore throat was gone. Since then, no further upper back, upper abdominal pain, chest pain or sore throat. No history of dysphagia. She is taking Pantoprazole 40mg  once daily as prescribed by Dr. Carlean Purl since 07/24/2020. She complains of having a decreased appetite. Early satiety. No weight loss. She continues to have abdominal bloat which has been worse over the past 18 months. She passes a normal brown formed BM once or twice weekly then the next week she passes a normal BM daily which is her typical bowel pattern. No rectal bleeding or melena. She is taking a  probiotic one daily. She takes Advil 200mg  two tabs every other day for arthritis. She underwent an EGD 04/14/2017 which showed possible esophagitis, stomach and duodenum were normal. Esophageal biopsies were negative. A colonoscopy was done on the same date which showed one small polyp which was removed from the colon, biopsies showed benign polypoid mucosa. She was advised to repeat a colonoscopy 03/2022.    CBC Latest Ref Rng & Units 08/13/2019 08/07/2019 10/04/2018  WBC 4.0 - 10.5 K/uL - 4.4 9.6  Hemoglobin 12.0 - 15.0 g/dL 11.1(L) 11.1(L) 10.7(L)  Hematocrit 36 - 46 % 36.7 37.8 33.2(L)  Platelets 150 - 400 K/uL - 296 261   CMP Latest Ref Rng & Units 08/07/2019 10/03/2018 08/29/2018  Glucose 70 - 99 mg/dL 87 98 73  BUN 8 - 23 mg/dL 16 12 8   Creatinine 0.44 - 1.00 mg/dL 0.90 0.83 0.51  Sodium 135 - 145 mmol/L 139 133(L) 136  Potassium 3.5 - 5.1 mmol/L 4.6 3.9 4.6  Chloride 98 - 111 mmol/L 104 100 108  CO2 22 - 32 mmol/L 29 25 19(L)  Calcium 8.9 - 10.3 mg/dL 9.5 8.8(L) 7.8(L)  Total Protein 6.5 - 8.1 g/dL - - -  Total Bilirubin 0.3 - 1.2 mg/dL - - -  Alkaline Phos 38 - 126 U/L - - -  AST 15 - 41 U/L - - -  ALT 0 - 44 U/L - - -    EGD 04/14/2017: - Reflux esophagitis. Biopsied. - The examination was  otherwise normal.  Colonoscopy 04/14/2017 by Dr. Carlean Purl: - One 3 mm polyp in the transverse colon, removed with a cold snare. Resected and retrieved. - Diverticulosis in the sigmoid colon and in the descending colon. - The examination was otherwise normal on direct and retroflexion views. - Recall colonoscopy 03/2022 Biopsy results: 1. Surgical [P], esophagus - UNREMARKABLE SQUAMOUS MUCOSA. - NO FEATURES OF EOSINOPHILIC ESOPHAGITIS (LESS THAN 2 EOSINOPHILS PER HIGH POWER FIELD). - PAS STAIN NEGATIVE FOR FUNGUS. - NO DYSPLASIA OR MALIGNANCY. 2. Surgical [P], transverse, polyp - BENIGN POLYPOID COLONIC MUCOSA. - NO ADENOMATOUS CHANGE OR MALIGNANCY   Current Outpatient Medications on  File Prior to Visit  Medication Sig Dispense Refill  . atorvastatin (LIPITOR) 10 MG tablet Take 10 mg by mouth at bedtime.     Marland Kitchen buPROPion (WELLBUTRIN XL) 300 MG 24 hr tablet Take 300 mg by mouth daily.    . Calcium-Phosphorus-Vitamin D (CITRACAL +D3 PO) Take 1 tablet by mouth at bedtime.    Marland Kitchen CANNABIDIOL PO Place 1 Dose under the tongue at bedtime. CBD OIL    . Echinacea 450 MG CAPS Take 450 mg by mouth daily.    . fluticasone (FLONASE) 50 MCG/ACT nasal spray Place 2 sprays into the nose daily.     Marland Kitchen ibuprofen (ADVIL) 200 MG tablet Take 400 mg by mouth every 8 (eight) hours as needed (pain.).    Marland Kitchen letrozole (FEMARA) 2.5 MG tablet Take 1 tablet (2.5 mg total) by mouth daily. 90 tablet 3  . levothyroxine (SYNTHROID) 75 MCG tablet Take 75 mcg by mouth daily before breakfast.    . metoprolol tartrate (LOPRESSOR) 25 MG tablet Take 0.5 tablets (12.5 mg total) by mouth 2 (two) times daily.    . Multiple Vitamin (MULTIVITAMIN WITH MINERALS) TABS tablet Take 1 tablet by mouth daily.    . pantoprazole (PROTONIX) 40 MG tablet Take 1 tablet (40 mg total) by mouth daily before breakfast. 30 tablet 1  . Probiotic Product (PROBIOTIC DAILY PO) Take 1 capsule by mouth daily.    Marland Kitchen senna (SENOKOT) 8.6 MG tablet Take 1-2 tablets by mouth daily as needed for constipation.    Marland Kitchen zolpidem (AMBIEN) 10 MG tablet Take 2.5 mg by mouth at bedtime as needed for sleep.      No current facility-administered medications on file prior to visit.    Allergies  Allergen Reactions  . Dilaudid [Hydromorphone Hcl] Nausea And Vomiting  . Terak [Terramycin] Rash  . Amoxicillin Diarrhea    Has patient had a PCN reaction causing immediate rash, facial/tongue/throat swelling, SOB or lightheadedness with hypotension: No Has patient had a PCN reaction causing severe rash involving mucus membranes or skin necrosis: No Has patient had a PCN reaction that required hospitalization: No Has patient had a PCN reaction occurring within the  last 10 years: Unknown If all of the above answers are "NO", then may proceed with Cephalosporin use.      Current Medications, Allergies, Past Medical History, Past Surgical History, Family History and Social History were reviewed in [employer] record.   Review of Systems:   Constitutional: Negative for fever, sweats, chills or weight loss.  Respiratory: Negative for shortness of breath.   Cardiovascular: Negative for chest pain, palpitations and leg swelling.  Gastrointestinal: See HPI.  Musculoskeletal: Negative for back pain or muscle aches.  Neurological: Negative for dizziness, headaches or paresthesias.    Physical Exam: BP 96/60   Pulse (!) 52   Ht 5' 5.75" (1.67 m)  Wt 133 lb (60.3 kg)   BMI 21.63 kg/m   General: Well developed 74 year old female in no acute distress. Head: Normocephalic and atraumatic. Eyes: No scleral icterus. Conjunctiva pink . Ears: Normal auditory acuity. Mouth: Dentition intact. No ulcers or lesions.  Lungs: Clear throughout to auscultation. Heart: Regular rate and rhythm, no murmur. Abdomen: Soft, nontender and nondistended. No masses or hepatomegaly. Normal bowel sounds x 4 quadrants.  Rectal: Deferred.  Musculoskeletal: Symmetrical with no gross deformities. Extremities: No edema. Neurological: Alert oriented x 4. No focal deficits.  Psychological: Alert and cooperative. Normal mood and affect  Assessment and Recommendations:  55. 74 year old female with episodic upper back pain, pain under the right and left rib cages concerning for biliary colic. No classic heartburn. Early satiety. No weight loss. She had a sore throat x 1 night  which has not recurred.  -CBC, CMP, CRP and lipase  -Abdominal sonogram to evaluate the gallbladder and CBD -Discussed repeat EGD, await the above lab and sono results prior to scheduling an EGD -Continue Pantoprazole 40mg  QD -Hyoscyamine 0.125mg  one tab SL Q 6 to 8 hours as needed    -Patient to call our office if her symptoms recur   2. Constipation  -Miralax Q HS  3. Personal history of breast cancer   4. History of colon polyps -Next colonoscopy due 03/2022

## 2020-08-27 ENCOUNTER — Encounter: Payer: Self-pay | Admitting: *Deleted

## 2020-08-27 ENCOUNTER — Ambulatory Visit (INDEPENDENT_AMBULATORY_CARE_PROVIDER_SITE_OTHER): Payer: Medicare Other | Admitting: Nurse Practitioner

## 2020-08-27 ENCOUNTER — Other Ambulatory Visit (INDEPENDENT_AMBULATORY_CARE_PROVIDER_SITE_OTHER): Payer: Medicare Other

## 2020-08-27 VITALS — BP 96/60 | HR 52 | Ht 65.75 in | Wt 133.0 lb

## 2020-08-27 DIAGNOSIS — R140 Abdominal distension (gaseous): Secondary | ICD-10-CM

## 2020-08-27 DIAGNOSIS — K5900 Constipation, unspecified: Secondary | ICD-10-CM

## 2020-08-27 DIAGNOSIS — R1010 Upper abdominal pain, unspecified: Secondary | ICD-10-CM | POA: Diagnosis not present

## 2020-08-27 DIAGNOSIS — M549 Dorsalgia, unspecified: Secondary | ICD-10-CM

## 2020-08-27 DIAGNOSIS — R6881 Early satiety: Secondary | ICD-10-CM | POA: Diagnosis not present

## 2020-08-27 LAB — COMPREHENSIVE METABOLIC PANEL
ALT: 14 U/L (ref 0–35)
AST: 19 U/L (ref 0–37)
Albumin: 4.3 g/dL (ref 3.5–5.2)
Alkaline Phosphatase: 71 U/L (ref 39–117)
BUN: 14 mg/dL (ref 6–23)
CO2: 28 mEq/L (ref 19–32)
Calcium: 9.3 mg/dL (ref 8.4–10.5)
Chloride: 105 mEq/L (ref 96–112)
Creatinine, Ser: 0.89 mg/dL (ref 0.40–1.20)
GFR: 63.99 mL/min (ref >60.00)
Glucose, Bld: 82 mg/dL (ref 70–99)
Potassium: 3.7 mEq/L (ref 3.5–5.1)
Sodium: 140 mEq/L (ref 135–145)
Total Bilirubin: 0.4 mg/dL (ref 0.2–1.2)
Total Protein: 6.9 g/dL (ref 6.0–8.3)

## 2020-08-27 LAB — LIPASE: Lipase: 16 U/L (ref 11.0–59.0)

## 2020-08-27 LAB — CBC WITH DIFFERENTIAL/PLATELET
Basophils Absolute: 0 10*3/uL (ref 0.0–0.1)
Basophils Relative: 0.2 % (ref 0.0–3.0)
Eosinophils Absolute: 0.3 10*3/uL (ref 0.0–0.7)
Eosinophils Relative: 7.3 % — ABNORMAL HIGH (ref 0.0–5.0)
HCT: 39.7 % (ref 36.0–46.0)
Hemoglobin: 13.3 g/dL (ref 12.0–15.0)
Lymphocytes Relative: 20 % (ref 12.0–46.0)
Lymphs Abs: 0.9 10*3/uL (ref 0.7–4.0)
MCHC: 33.4 g/dL (ref 30.0–36.0)
MCV: 94 fl (ref 78.0–100.0)
Monocytes Absolute: 0.6 10*3/uL (ref 0.1–1.0)
Monocytes Relative: 12.3 % — ABNORMAL HIGH (ref 3.0–12.0)
Neutro Abs: 2.7 10*3/uL (ref 1.4–7.7)
Neutrophils Relative %: 60.2 % (ref 43.0–77.0)
Platelets: 252 10*3/uL (ref 150.0–400.0)
RBC: 4.22 Mil/uL (ref 3.87–5.11)
RDW: 13.6 % (ref 11.5–15.5)
WBC: 4.5 10*3/uL (ref 4.0–10.5)

## 2020-08-27 LAB — C-REACTIVE PROTEIN: CRP: <1 mg/dL (ref 0.5–20.0)

## 2020-08-27 MED ORDER — HYOSCYAMINE SULFATE 0.125 MG SL SUBL: SUBLINGUAL_TABLET | SUBLINGUAL | 2 refills | Status: AC

## 2020-08-27 NOTE — Patient Instructions (Signed)
Your provider has requested that you go to the basement level for lab work before leaving today. Press "B" on the elevator. The lab is located at the first door on the left as you exit the elevator.  _____________________________________________________________  [name] have been scheduled for an abdominal ultrasound at [facility] (1st floor of hospital) on Thursday 09/03/20 at 8:30 am. Please arrive 15 minutes prior to your appointment for registration. Make certain not to have anything to eat or drink 6 hours prior to your appointment. Should you need to reschedule your appointment, please contact radiology at [phone]. This test typically takes about 30 minutes to perform.  ________________________________________________________________  Continue Pantoprazole 40 mg once daily.  ________________________________________________________________  We have sent the following medications to your pharmacy for you to pick up at your convenience: Hyoscyamine 0.125 mg-1 tablet by mouth every 6-8 hours as needed for abdominal pain.  ________________________________________________________________  Please purchase the following medications over the counter and take as directed: Miralax 17 gram (1 capful) dissolved in at least 8 ounces water/juice once daily.  ________________________________________________________________  Call our office should your symptoms worsen despite the above measures.  _________________________________________________________________  If you are age 73 or older, your body mass index should be between 23-30. Your Body mass index is 21.63 kg/m. If this is out of the aforementioned range listed, please consider follow up with your Primary Care Provider. _________________________________________________________________  Due to recent changes in healthcare laws, you may see the results of your imaging and laboratory studies on MyChart before your provider has had  a chance to review them.  We understand that in some cases there may be results that are confusing or concerning to you. Not all laboratory results come back in the same time frame and the provider may be waiting for multiple results in order to interpret others.  Please give [place] 48 hours in order for your provider to thoroughly review all the results before contacting the office for clarification of your results.

## 2020-09-03 ENCOUNTER — Other Ambulatory Visit (HOSPITAL_COMMUNITY): Payer: Medicare Other

## 2020-09-09 ENCOUNTER — Ambulatory Visit (HOSPITAL_COMMUNITY)
Admission: RE | Admit: 2020-09-09 | Discharge: 2020-09-09 | Disposition: A | Discharge status: Home / Self Care | Payer: Medicare Other | Source: Ambulatory Visit | Attending: Nurse Practitioner | Admitting: Nurse Practitioner

## 2020-09-09 ENCOUNTER — Other Ambulatory Visit: Payer: Self-pay

## 2020-09-09 DIAGNOSIS — R140 Abdominal distension (gaseous): Secondary | ICD-10-CM | POA: Diagnosis not present

## 2020-09-09 DIAGNOSIS — R1010 Upper abdominal pain, unspecified: Secondary | ICD-10-CM | POA: Diagnosis not present

## 2020-09-09 DIAGNOSIS — M549 Dorsalgia, unspecified: Secondary | ICD-10-CM

## 2020-09-09 DIAGNOSIS — R6881 Early satiety: Secondary | ICD-10-CM

## 2020-09-10 ENCOUNTER — Other Ambulatory Visit: Payer: Self-pay | Admitting: Family Medicine

## 2020-09-10 DIAGNOSIS — Z1231 Encounter for screening mammogram for malignant neoplasm of breast: Secondary | ICD-10-CM

## 2020-09-14 ENCOUNTER — Ambulatory Visit: Payer: Medicare Other | Attending: Internal Medicine

## 2020-09-14 DIAGNOSIS — Z23 Encounter for immunization: Secondary | ICD-10-CM

## 2020-09-14 NOTE — Progress Notes (Signed)
   Covid-19 Vaccination Clinic  Name:  [name]    MRN: [id number] DOB: [birth year]  09/14/2020  [name] was observed post Covid-19 immunization for 15 minutes without incident. She was provided with Vaccine Information Sheet and instruction to access the V-Safe system.   [name] was instructed to call 911 with any severe reactions post vaccine: Marland Kitchen Difficulty breathing  . Swelling of face and throat  . A fast heartbeat  . A bad rash all over body  . Dizziness and weakness

## 2020-10-02 DIAGNOSIS — Z23 Encounter for immunization: Secondary | ICD-10-CM | POA: Diagnosis not present

## 2020-10-27 ENCOUNTER — Other Ambulatory Visit: Payer: Medicare Other

## 2020-10-27 DIAGNOSIS — Z20822 Contact with and (suspected) exposure to covid-19: Secondary | ICD-10-CM | POA: Diagnosis not present

## 2020-10-29 LAB — NOVEL CORONAVIRUS, NAA: SARS-CoV-2, NAA: NOT DETECTED

## 2020-10-29 LAB — SARS-COV-2, NAA 2 DAY TAT

## 2020-11-02 ENCOUNTER — Other Ambulatory Visit: Payer: Self-pay

## 2020-11-02 ENCOUNTER — Ambulatory Visit
Admission: RE | Admit: 2020-11-02 | Discharge: 2020-11-02 | Disposition: A | Discharge status: Home / Self Care | Payer: Medicare Other | Source: Ambulatory Visit | Attending: Family Medicine | Admitting: Family Medicine

## 2020-11-02 DIAGNOSIS — Z1231 Encounter for screening mammogram for malignant neoplasm of breast: Secondary | ICD-10-CM

## 2021-01-26 NOTE — Progress Notes (Signed)
Patient Care Team: Catha Gosselin, MD as PCP - General (Family Medicine) Rollene Rotunda, MD as PCP - Cardiology (Cardiology)  DIAGNOSIS:    ICD-10-CM   1. Malignant neoplasm of upper-outer quadrant of right breast in female, estrogen receptor positive (HCC)  C50.411    Z17.0     SUMMARY OF ONCOLOGIC HISTORY: Oncology History  Breast cancer of upper-outer quadrant of right female breast (HCC)  04/23/2013 Initial Diagnosis   Right breast calcifications stereotactic biopsy revealed invasive lobular cancer with LCIS, lymph node benign, ER positive PR negative Ki-67 10%   06/11/2013 Surgery   Right breast lumpectomy: Lobular carcinoma in situ, no invasive disease found, sentinel nodes negative   08/06/2013 - 09/04/2013 Radiation Therapy   Adjuvant radiation therapy   10/03/2013 -  Anti-estrogen oral therapy   Tamoxifen 20 mg daily, switched to letrozole 10/15/18 due to PE     CHIEF COMPLIANT: Follow-up of right breast cancer on letrozole  INTERVAL HISTORY: [name] is a 75 y.o. with above-mentioned history of right breast cancer who is currently on antiestrogen therapy with letrozole. Mammogram on 11/02/20 showed no evidence of malignancy bilaterally. She presents to the clinictoday for annual follow-up.   She denies any lumps or nodules in the breast.  Denies any tenderness.  Tolerating letrozole extremely well without any problems.  She does have arthritis.  No further problems with breathing related to prior PE.  ALLERGIES:  is allergic to dilaudid [hydromorphone hcl], terak [terramycin], and amoxicillin.  MEDICATIONS:  Current Outpatient Medications  Medication Sig Dispense Refill  . atorvastatin (LIPITOR) 10 MG tablet Take 10 mg by mouth at bedtime.     Marland Kitchen buPROPion (WELLBUTRIN XL) 300 MG 24 hr tablet Take 300 mg by mouth daily.    . Calcium-Phosphorus-Vitamin D (CITRACAL +D3 PO) Take 1 tablet by mouth at bedtime.    Marland Kitchen CANNABIDIOL PO Place 1 Dose under the tongue  at bedtime. CBD OIL    . Echinacea 450 MG CAPS Take 450 mg by mouth daily.    . fluticasone (FLONASE) 50 MCG/ACT nasal spray Place 2 sprays into the nose daily.     . hyoscyamine (LEVSIN SL) 0.125 MG SL tablet Take 1 tablet by mouth every 6-8 hours as needed for abdominal pain 60 tablet 2  . ibuprofen (ADVIL) 200 MG tablet Take 400 mg by mouth every 8 (eight) hours as needed (pain.).    Marland Kitchen letrozole (FEMARA) 2.5 MG tablet Take 1 tablet (2.5 mg total) by mouth daily. 90 tablet 3  . levothyroxine (SYNTHROID) 75 MCG tablet Take 75 mcg by mouth daily before breakfast.    . metoprolol tartrate (LOPRESSOR) 25 MG tablet Take 0.5 tablets (12.5 mg total) by mouth 2 (two) times daily.    . Multiple Vitamin (MULTIVITAMIN WITH MINERALS) TABS tablet Take 1 tablet by mouth daily.    . pantoprazole (PROTONIX) 40 MG tablet Take 1 tablet (40 mg total) by mouth daily before breakfast. 30 tablet 1  . Probiotic Product (PROBIOTIC DAILY PO) Take 1 capsule by mouth daily.    Marland Kitchen senna (SENOKOT) 8.6 MG tablet Take 1-2 tablets by mouth daily as needed for constipation.    Marland Kitchen zolpidem (AMBIEN) 10 MG tablet Take 2.5 mg by mouth at bedtime as needed for sleep.      No current facility-administered medications for this visit.    PHYSICAL EXAMINATION: ECOG PERFORMANCE STATUS: 1 - Symptomatic but completely ambulatory  Vitals:   01/27/21 1147  BP: 124/72  Pulse: (!) 45  Resp: 18  Temp: (!) 97.5 F (36.4 C)  SpO2: 100%   Filed Weights   01/27/21 1147  Weight: 134 lb 11.2 oz (61.1 kg)    BREAST: No palpable masses or nodules in either right or left breasts. No palpable axillary supraclavicular or infraclavicular adenopathy no breast tenderness or nipple discharge. (exam performed in the presence of a chaperone)  LABORATORY DATA:  I have reviewed the data as listed CMP Latest Ref Rng & Units 08/27/2020 08/07/2019 10/03/2018  Glucose 70 - 99 mg/dL 82 87 98  BUN 6 - 23 mg/dL 14 16 12   Creatinine 0.40 - 1.20 mg/dL  5.46 2.70 3.50  Sodium 135 - 145 mEq/L 140 139 133(L)  Potassium 3.5 - 5.1 mEq/L 3.7 4.6 3.9  Chloride 96 - 112 mEq/L 105 104 100  CO2 19 - 32 mEq/L 28 29 25   Calcium 8.4 - 10.5 mg/dL 9.3 9.5 0.9(F)  Total Protein 6.0 - 8.3 g/dL 6.9 - -  Total Bilirubin 0.2 - 1.2 mg/dL 0.4 - -  Alkaline Phos 39 - 117 U/L 71 - -  AST 0 - 37 U/L 19 - -  ALT 0 - 35 U/L 14 - -    Lab Results  Component Value Date   WBC 4.5 08/27/2020   HGB 13.3 08/27/2020   HCT 39.7 08/27/2020   MCV 94.0 08/27/2020   PLT 252.0 08/27/2020   NEUTROABS 2.7 08/27/2020    ASSESSMENT & PLAN:  Breast cancer of upper-outer quadrant of right female breast Right breast invasive lobular cancer with LCIS T1 N0 M0 stage IA status post lumpectomy which did not show any further evidence of invasive breast cancer. ER positive PR positive HER-2 negative, received adjuvant radiation completed 09/04/2013, started her tamoxifen 20 mg daily from November 2013-10/15/2018, switched to letrozole  Letrozoletoxicities:Denies any hot flashes or significant increase in arthralgias or myalgias. Continue letrozole for total of 5 years  Breast cancer surveillance: 1. Breast exam3/9/2022does not reveal any palpable lumps or nodules of concern. 2. Mammogram12/16/2021: Benign, breast density categoryB 3. Breast MRI8/03/2018: Negative, we do not plan to do regular MRIs    Pulmonary embolism 10/03/2018: Treated with 6 months of Xarelto (knee replacement and tamoxifen)  Osteopenia: Bone density test will be ordered for November 2022.  Previously bone density showed a T score of -2.4: Osteopenia She does exercise and calcium and vitamin D.   Return to clinic in 1 year for follow-up    No orders of the defined types were placed in this encounter.  The patient has a good understanding of the overall plan. she agrees with it. she will call with any problems that may develop before the next visit here.  Total time spent: 20 mins  including face to face time and time spent for planning, charting and coordination of care  Sabas Sous, MD, MPH 01/27/2021  I, Kirt Boys Dorshimer, am acting as scribe for Dr. Serena Croissant.  I have reviewed the above documentation for accuracy and completeness, and I agree with the above.

## 2021-01-27 ENCOUNTER — Telehealth: Payer: Self-pay | Admitting: Hematology and Oncology

## 2021-01-27 ENCOUNTER — Inpatient Hospital Stay: Payer: Medicare Other | Attending: Hematology and Oncology | Admitting: Hematology and Oncology

## 2021-01-27 ENCOUNTER — Other Ambulatory Visit: Payer: Self-pay

## 2021-01-27 VITALS — BP 124/72 | HR 45 | Temp 97.5°F | Resp 18 | Ht 65.75 in | Wt 134.7 lb

## 2021-01-27 DIAGNOSIS — Z170 Estrogen receptor positive status [ER+]: Secondary | ICD-10-CM | POA: Insufficient documentation

## 2021-01-27 DIAGNOSIS — C50411 Malignant neoplasm of upper-outer quadrant of right female breast: Secondary | ICD-10-CM | POA: Diagnosis not present

## 2021-01-27 DIAGNOSIS — Z79899 Other long term (current) drug therapy: Secondary | ICD-10-CM | POA: Diagnosis not present

## 2021-01-27 DIAGNOSIS — Z923 Personal history of irradiation: Secondary | ICD-10-CM | POA: Diagnosis not present

## 2021-01-27 DIAGNOSIS — Z780 Asymptomatic menopausal state: Secondary | ICD-10-CM | POA: Diagnosis not present

## 2021-01-27 DIAGNOSIS — Z86711 Personal history of pulmonary embolism: Secondary | ICD-10-CM | POA: Diagnosis not present

## 2021-01-27 DIAGNOSIS — M8580 Other specified disorders of bone density and structure, unspecified site: Secondary | ICD-10-CM | POA: Insufficient documentation

## 2021-01-27 DIAGNOSIS — Z79811 Long term (current) use of aromatase inhibitors: Secondary | ICD-10-CM | POA: Insufficient documentation

## 2021-01-27 MED ORDER — LETROZOLE 2.5 MG PO TABS: 2.5000 mg | ORAL_TABLET | Freq: Every day | ORAL | 3 refills | Status: DC

## 2021-01-27 NOTE — Telephone Encounter (Signed)
Scheduled appts per 3/9 los. Pt stated she would refer to mychart for AVS

## 2021-01-27 NOTE — Assessment & Plan Note (Signed)
Right breast invasive lobular cancer with LCIS T1 N0 M0 stage IA status post lumpectomy which did not show any further evidence of invasive breast cancer. ER positive PR positive HER-2 negative, received adjuvant radiation completed 09/04/2013, started her tamoxifen 20 mg daily from November 2013-10/15/2018, switched to letrozole  Letrozoletoxicities:Denies any hot flashes or significant increase in arthralgias or myalgias. Continue letrozole for total of 5 years  Breast cancer surveillance: 1. Breast exam3/9/2022does not reveal any palpable lumps or nodules of concern. 2. Mammogram12/16/2021: Benign, breast density categoryB 3. Breast MRI8/03/2018: Negative, we do not plan to do regular MRIs    CT chest 10/03/2018: Small volume pulmonary emboli noted right lower lobe, small right pleural effusion and airspace opacity likely due to pulmonary infarction Risk factors: Knee replacement surgery and being on tamoxifen Prior treatment: Xareltofor 6 months started November 2019, completed May 2020   Return to clinic in 1 year for follow-up

## 2021-02-16 ENCOUNTER — Other Ambulatory Visit: Payer: Self-pay | Admitting: Hematology and Oncology

## 2021-02-16 DIAGNOSIS — Z1231 Encounter for screening mammogram for malignant neoplasm of breast: Secondary | ICD-10-CM

## 2021-03-11 ENCOUNTER — Other Ambulatory Visit (INDEPENDENT_AMBULATORY_CARE_PROVIDER_SITE_OTHER): Payer: Medicare Other

## 2021-03-11 ENCOUNTER — Encounter: Payer: Self-pay | Admitting: Nurse Practitioner

## 2021-03-11 ENCOUNTER — Ambulatory Visit: Payer: Medicare Other | Admitting: Nurse Practitioner

## 2021-03-11 VITALS — BP 100/70 | HR 52 | Ht 65.5 in | Wt 136.0 lb

## 2021-03-11 DIAGNOSIS — K5790 Diverticulosis of intestine, part unspecified, without perforation or abscess without bleeding: Secondary | ICD-10-CM

## 2021-03-11 DIAGNOSIS — K5900 Constipation, unspecified: Secondary | ICD-10-CM | POA: Diagnosis not present

## 2021-03-11 DIAGNOSIS — R1032 Left lower quadrant pain: Secondary | ICD-10-CM

## 2021-03-11 LAB — COMPREHENSIVE METABOLIC PANEL WITH GFR
ALT: 13 U/L (ref 0–35)
AST: 17 U/L (ref 0–37)
Albumin: 4 g/dL (ref 3.5–5.2)
Alkaline Phosphatase: 71 U/L (ref 39–117)
BUN: 17 mg/dL (ref 6–23)
CO2: 30 meq/L (ref 19–32)
Calcium: 9.4 mg/dL (ref 8.4–10.5)
Chloride: 105 meq/L (ref 96–112)
Creatinine, Ser: 0.95 mg/dL (ref 0.40–1.20)
GFR: 59.15 mL/min — ABNORMAL LOW
Glucose, Bld: 85 mg/dL (ref 70–99)
Potassium: 4.2 meq/L (ref 3.5–5.1)
Sodium: 141 meq/L (ref 135–145)
Total Bilirubin: 0.4 mg/dL (ref 0.2–1.2)
Total Protein: 6.7 g/dL (ref 6.0–8.3)

## 2021-03-11 LAB — CBC WITH DIFFERENTIAL/PLATELET
Basophils Absolute: 0 K/uL (ref 0.0–0.1)
Basophils Relative: 0.2 % (ref 0.0–3.0)
Eosinophils Absolute: 0.3 K/uL (ref 0.0–0.7)
Eosinophils Relative: 5.9 % — ABNORMAL HIGH (ref 0.0–5.0)
HCT: 38.6 % (ref 36.0–46.0)
Hemoglobin: 12.8 g/dL (ref 12.0–15.0)
Lymphocytes Relative: 18.6 % (ref 12.0–46.0)
Lymphs Abs: 0.9 K/uL (ref 0.7–4.0)
MCHC: 33.2 g/dL (ref 30.0–36.0)
MCV: 95.6 fl (ref 78.0–100.0)
Monocytes Absolute: 0.6 K/uL (ref 0.1–1.0)
Monocytes Relative: 12.1 % — ABNORMAL HIGH (ref 3.0–12.0)
Neutro Abs: 3 K/uL (ref 1.4–7.7)
Neutrophils Relative %: 63.2 % (ref 43.0–77.0)
Platelets: 240 K/uL (ref 150.0–400.0)
RBC: 4.04 Mil/uL (ref 3.87–5.11)
RDW: 13.6 % (ref 11.5–15.5)
WBC: 4.7 K/uL (ref 4.0–10.5)

## 2021-03-11 NOTE — Patient Instructions (Signed)
You have been scheduled for a CT scan of the abdomen and pelvis at [facility] ([address]---this is in the same building as [employer]).   You are scheduled on 03/18/2021 at 3:30pm. You should arrive 15 minutes prior to your appointment time for registration. Please follow the written instructions below on the day of your exam:  WARNING: IF YOU ARE ALLERGIC TO IODINE/X-RAY DYE, PLEASE NOTIFY RADIOLOGY IMMEDIATELY AT [phone]! YOU WILL BE GIVEN A 13 HOUR PREMEDICATION PREP.  1) Do not eat or drink anything after 11:30am (4 hours prior to your test) 2) You have been given 2 bottles of oral contrast to drink. The solution may taste better if refrigerated, but do NOT add ice or any other liquid to this solution. Shake well before drinking.    Drink 1 bottle of contrast @ 1:30pm (2 hours prior to your exam)  Drink 1 bottle of contrast @ 2:30pm (1 hour prior to your exam)  You may take any medications as prescribed with a small amount of water, if necessary. If you take any of the following medications: METFORMIN, GLUCOPHAGE, GLUCOVANCE, AVANDAMET, RIOMET, FORTAMET, [place] MET, JANUMET, GLUMETZA or METAGLIP, you MAY be asked to HOLD this medication 48 hours AFTER the exam.  The purpose of you drinking the oral contrast is to aid in the visualization of your intestinal tract. The contrast solution may cause some diarrhea. Depending on your individual set of symptoms, you may also receive an intravenous injection of x-ray contrast/dye. Plan on being at [facility] for 30 minutes or longer, depending on the type of exam you are having performed.  This test typically takes 30-45 minutes to complete.  If you have any questions regarding your exam or if you need to reschedule, you may call the CT department at [phone] between the hours of 8:00 am and 5:00 pm, Monday-Friday.  ________________________________________________________________________   Your  provider has requested that you go to the basement level for lab work before leaving today. Press "B" on the elevator. The lab is located at the first door on the left as you exit the elevator.  Take Miralax every night at bedtime to output as tolerated  Go to ER if severe abdominal pain occurs  Due to recent changes in healthcare laws, you may see the results of your imaging and laboratory studies on MyChart before your provider has had a chance to review them.  We understand that in some cases there may be results that are confusing or concerning to you. Not all laboratory results come back in the same time frame and the provider may be waiting for multiple results in order to interpret others.  Please give [place] 48 hours in order for your provider to thoroughly review all the results before contacting the office for clarification of your results.   I appreciate the  opportunity to care for you  Thank You   Marcella Dubs

## 2021-03-11 NOTE — Progress Notes (Signed)
03/11/2021 [name] [id number] [birth year]   Chief Complaint:  LLQ pain   History of Present Illness: [name]. [name] is a 75 year old female with a past medical history of breast cancer s/p right breast lumpectomy and radiation 2014, palpitations, MVP, PVCs, Grave's disease, PE s/p right knee replacement surgery 2019, kidney stones, arthritis and colon polyps.  She presents to our office today with complaints of intermittent left lower quadrant abdominal pain for the past 6 months.  She has intermittent constipation.  She can pass a normal formed brown bowel movement for 2 weeks then goes for 5 days without passing a bowel movement.  She takes a probiotic. Tuesday and Wednesday of this week she passed small formed brown bowel movements and no BM yet today.  No rectal bleeding or melena.  She used MiraLAX in the past which was well-tolerated but infrequently uses it at this point.  She had worse persistent left lower quadrant abdominal pain and approximately 1 week ago which lessened the next day.  Since that time, she has intermittent left lower abdominal pressure, "rolling"  type pain.  No fevers.  She denies ever having diverticulitis.  Her most recent colonoscopy was 04/14/2017 which identified a 3 mm polyp which was removed from the transverse colon. Biopsies showed a benign polypoid mucosa. Multiple small and large mouth diverticula in the sigmoid and descending colon.  She was advised to repeat a colonoscopy 03/2022.     Past Medical History:  Diagnosis Date  . Allergic rhinitis   . Arthritis    "knees" (08/28/2018)  . Breast cancer, right breast ([place]) 05/20/13   "no chemo" (08/28/2018)  . Chronic lower back pain   . Diverticulosis   . DOE (dyspnea on exertion)   . Graves' disease    2011  . Heart palpitations   . History of kidney stones   . HLD (hyperlipidemia)   . Hx of radiation therapy 08/13/13- 09/06/13   right breast 4250 cGy 17 sessions  . Hypertension   . IBS  (irritable bowel syndrome)   . Lumbar herniated disc    L4-L5  . Major depression   . MVP (mitral valve prolapse)   . Osteopenia   . Personal history of radiation therapy   . Pneumonia 08/2019  . PONV (postoperative nausea and vomiting)    "when I have general anesthetic" (08/28/2018)  . Pulmonary emboli (HCC)    after knee replacement  . PVC (premature ventricular contraction)    Takes metoprolol PRN for  pvc's  . Tubular adenoma of colon 03/07/2012    Past Surgical History:  Procedure Laterality Date  . BREAST BIOPSY Right 2014  . BREAST EXCISIONAL BIOPSY Right   . BREAST LUMPECTOMY WITH NEEDLE LOCALIZATION AND AXILLARY SENTINEL LYMPH NODE BX Right 06/11/2013   Procedure: BREAST LUMPECTOMY WITH NEEDLE LOCALIZATION AND AXILLARY SENTINEL LYMPH NODE BX;  Surgeon: Harl Bowie, MD;  Location: [facility];  Service: General;  Laterality: Right;  Needle localization BCG 7:30  . COLONOSCOPY W/ BIOPSIES AND POLYPECTOMY  multiple  . CYSTOSCOPY WITH STENT PLACEMENT Left 08/15/2019   Procedure: CYSTOSCOPY, left retrograde pyleogram  WITH STENT EXCHANGE left and foley placement;  Surgeon: Franchot Gallo, MD;  Location: [facility];  Service: Urology;  Laterality: Left;  . IR URETERAL STENT LEFT NEW ACCESS W/O SEP NEPHROSTOMY CATH  08/12/2019  . KNEE ARTHROSCOPY Left 2006  . NEPHROLITHOTOMY Left 08/12/2019   Procedure: NEPHROLITHOTOMY PERCUTANEOUS/INSERTION DOUBLE J STENT;  Surgeon: Franchot Gallo,  MD;  Location: [facility];  Service: Urology;  Laterality: Left;  2 HRS  . TOTAL KNEE ARTHROPLASTY Right 08/28/2018   Procedure: RIGHT TOTAL KNEE ARTHROPLASTY;  Surgeon: Garald Balding, MD;  Location: [facility];  Service: Orthopedics;  Laterality: Right;   Current Outpatient Medications on File Prior to Visit  Medication Sig Dispense Refill  . atorvastatin (LIPITOR) 10 MG tablet Take 10 mg by mouth at bedtime.     Marland Kitchen buPROPion (WELLBUTRIN XL) 300 MG 24 hr tablet Take 300 mg by mouth daily.    .  Calcium-Phosphorus-Vitamin D (CITRACAL +D3 PO) Take 1 tablet by mouth at bedtime.    . Echinacea 450 MG CAPS Take 450 mg by mouth daily.    . fluticasone (FLONASE) 50 MCG/ACT nasal spray Place 2 sprays into the nose daily.     . hyoscyamine (LEVSIN SL) 0.125 MG SL tablet Take 1 tablet by mouth every 6-8 hours as needed for abdominal pain 60 tablet 2  . ibuprofen (ADVIL) 200 MG tablet Take 400 mg by mouth every 8 (eight) hours as needed (pain.).    Marland Kitchen letrozole (FEMARA) 2.5 MG tablet Take 1 tablet (2.5 mg total) by mouth daily. 90 tablet 3  . levothyroxine (SYNTHROID) 75 MCG tablet Take 75 mcg by mouth daily before breakfast.    . metoprolol tartrate (LOPRESSOR) 25 MG tablet Take 0.5 tablets (12.5 mg total) by mouth 2 (two) times daily.    . Multiple Vitamin (MULTIVITAMIN WITH MINERALS) TABS tablet Take 1 tablet by mouth daily.    . Probiotic Product (PROBIOTIC DAILY PO) Take 1 capsule by mouth daily.    Marland Kitchen senna (SENOKOT) 8.6 MG tablet Take 1-2 tablets by mouth daily as needed for constipation.    . Turmeric (QC TUMERIC COMPLEX PO) Take by mouth. Patient takes once daily.    Marland Kitchen zolpidem (AMBIEN) 10 MG tablet Take 2.5 mg by mouth at bedtime as needed for sleep.      No current facility-administered medications on file prior to visit.   Allergies  Allergen Reactions  . Dilaudid [Hydromorphone Hcl] Nausea And Vomiting  . Terak [Terramycin] Rash  . Amoxicillin Diarrhea    Has patient had a PCN reaction causing immediate rash, facial/tongue/throat swelling, SOB or lightheadedness with hypotension: No Has patient had a PCN reaction causing severe rash involving mucus membranes or skin necrosis: No Has patient had a PCN reaction that required hospitalization: No Has patient had a PCN reaction occurring within the last 10 years: Unknown If all of the above answers are "NO", then may proceed with Cephalosporin use.     Current Medications, Allergies, Past Medical History, Past Surgical History,  Family History and Social History were reviewed in [employer] record.  Review of Systems:   Constitutional: Negative for fever, sweats, chills or weight loss.  Respiratory: Negative for shortness of breath.   Cardiovascular: Negative for chest pain, palpitations and leg swelling.  Gastrointestinal: See HPI.  Musculoskeletal: Negative for back pain or muscle aches.  Neurological: Negative for dizziness, headaches or paresthesias.   Physical Exam: BP 100/70   Pulse (!) 52   Ht 5' 5.5" (1.664 m)   Wt 136 lb (61.7 kg)   BMI 22.29 kg/m  General: Well developed 75 year old female in no acute distress. Head: Normocephalic and atraumatic. Eyes: No scleral icterus. Conjunctiva pink . Ears: Normal auditory acuity. Mouth: Dentition intact. No ulcers or lesions.  Lungs: Clear throughout to auscultation. Heart: Regular rate and rhythm, no murmur. Abdomen: Soft,  nondistended.  No abdominal tenderness at this time.  No masses or hepatomegaly. Normal bowel sounds x 4 quadrants.  Rectal: Deferred.  Musculoskeletal: Symmetrical with no gross deformities. Extremities: No edema. Neurological: Alert oriented x 4. No focal deficits.  Psychological: Alert and cooperative. Normal mood and affect  Assessment and Recommendations:  7.  75 year old female with a history of diverticulosis presents with intermittent left lower quadrant abdominal pain for the past 6 months.  No abdominal tenderness on exam today. -CBC, CMP -Abdominal/pelvic CT with oral and IV contrast -MiraLAX nightly as tolerated -Push fluids, bland diet -Patient to call office if symptoms worsen -Patient to present to the ED if she develops severe abdominal pain -She may require a colonoscopy prior to her recall date -Further recommendations to be determined after the above lab and CT results reviewed

## 2021-03-12 ENCOUNTER — Telehealth: Payer: Self-pay | Admitting: Nurse Practitioner

## 2021-03-12 NOTE — Telephone Encounter (Signed)
Informed Taryn from [facility] CT that patient has [facility] Medicare and not Medicare A and B. The updated insurance cards in our system states [facility] Medicare. Taryn verbalized understanding and will keep her on the schedule for 4/28/.

## 2021-03-12 NOTE — Telephone Encounter (Signed)
Left message for [name] at [facility] CT to return my call.

## 2021-03-18 ENCOUNTER — Ambulatory Visit (INDEPENDENT_AMBULATORY_CARE_PROVIDER_SITE_OTHER)
Admission: RE | Admit: 2021-03-18 | Discharge: 2021-03-18 | Disposition: A | Discharge status: Home / Self Care | Payer: Medicare Other | Source: Ambulatory Visit | Attending: Nurse Practitioner | Admitting: Nurse Practitioner

## 2021-03-18 ENCOUNTER — Other Ambulatory Visit: Payer: Self-pay

## 2021-03-18 DIAGNOSIS — R1032 Left lower quadrant pain: Secondary | ICD-10-CM | POA: Diagnosis not present

## 2021-03-18 DIAGNOSIS — K5790 Diverticulosis of intestine, part unspecified, without perforation or abscess without bleeding: Secondary | ICD-10-CM

## 2021-03-18 MED ORDER — IOHEXOL 300 MG/ML  SOLN
100.0000 mL | Freq: Once | INTRAMUSCULAR | Status: AC | PRN
  Administered 2021-03-18: 100 mL via INTRAVENOUS

## 2021-04-18 NOTE — Addendum Note (Signed)
Encounter addended by: Annie Paras on: 04/18/2021 11:30 AM  Actions taken: Letter saved

## 2021-05-04 ENCOUNTER — Other Ambulatory Visit: Payer: Self-pay | Admitting: Internal Medicine

## 2021-05-05 ENCOUNTER — Other Ambulatory Visit: Payer: Self-pay

## 2021-05-05 ENCOUNTER — Other Ambulatory Visit (HOSPITAL_BASED_OUTPATIENT_CLINIC_OR_DEPARTMENT_OTHER): Payer: Self-pay

## 2021-05-05 ENCOUNTER — Ambulatory Visit: Payer: Medicare Other | Attending: Internal Medicine

## 2021-05-05 DIAGNOSIS — Z23 Encounter for immunization: Secondary | ICD-10-CM

## 2021-05-05 MED ORDER — PFIZER-BIONT COVID-19 VAC-TRIS 30 MCG/0.3ML IM SUSP
INTRAMUSCULAR | 0 refills | Status: DC
  Filled 2021-05-05: qty 0.3, 1d supply, fill #0

## 2021-05-05 NOTE — Progress Notes (Signed)
   Covid-19 Vaccination Clinic  Name:  [name]    MRN: [id number] DOB: 02-08-1946  05/05/2021  [name] was observed post Covid-19 immunization for 15 minutes without incident. She was provided with Vaccine Information Sheet and instruction to access the V-Safe system.   [name] was instructed to call 911 with any severe reactions post vaccine: Difficulty breathing  Swelling of face and throat  A fast heartbeat  A bad rash all over body  Dizziness and weakness   Immunizations Administered     Name Date Dose VIS Date Route   PFIZER Comrnaty(Gray TOP) Covid-19 Vaccine 05/05/2021 10:27 AM 0.3 mL 10/29/2020 Intramuscular   Manufacturer: [employer]   Lot: [id number]   [place]: [phone]

## 2021-07-13 NOTE — Progress Notes (Signed)
Cardiology Office Note   Date:  07/14/2021   ID:  Michelle, Bautista 19-Feb-1946, MRN [id number]  PCP:  Michelle Jewel, MD  Cardiologist:   Michelle Breeding, MD  Chief Complaint  Patient presents with   Coronary Artery Disease       History of Present Illness: [name] is a 75 y.o. female who I saw previously for evaluation of palpitations she had diagonal stenosis when noted on a CT that was done in [place] when she was living there.  She had a normal echo . I saw her 13 years ago and she was having palpitations. She does have mitral valve prolapse. However, nothing significant was noted and she didn't have any problems until she developed Graves' disease 2011. She again had palpitations. She was living in [place] and she had a workup by a cardiologist there. Coronary CT demonstrated some diagonal stenosis but she doesn't know of any other disease. She had a treadmill test which apparently was negative.   She had a normal echo in 2019.  She had a POET (Plain Old Exercise Treadmill) which was negative in 2020.  She had a PE after knee replacement.    Since I last saw her she has done well.  The patient denies any new symptoms such as chest discomfort, neck or arm discomfort. There has been no new shortness of breath, PND or orthopnea. There have been no reported palpitations, presyncope or syncope.  She exercises routinely.  She did have kidney stones and had to have surgery for this and has recurrent stones but does not have further intervention.   Past Medical History:  Diagnosis Date   Allergic rhinitis    Arthritis    "knees" (08/28/2018)   Breast cancer, right breast ([facility]) 05/20/13   "no chemo" (08/28/2018)   Chronic lower back pain    Diverticulosis    DOE (dyspnea on exertion)    Graves' disease    2011   Heart palpitations    History of kidney stones    HLD (hyperlipidemia)    Hx of radiation therapy 08/13/13- 09/06/13   right breast 4250 cGy 17  sessions   Hypertension    IBS (irritable bowel syndrome)    Lumbar herniated disc    L4-L5   Major depression    MVP (mitral valve prolapse)    Osteopenia    Personal history of radiation therapy    Pneumonia 08/2019   PONV (postoperative nausea and vomiting)    "when I have general anesthetic" (08/28/2018)   Pulmonary emboli (HCC)    after knee replacement   PVC (premature ventricular contraction)    Takes metoprolol PRN for  pvc's   Tubular adenoma of colon 03/07/2012    Past Surgical History:  Procedure Laterality Date   BREAST BIOPSY Right 2014   BREAST EXCISIONAL BIOPSY Right    BREAST LUMPECTOMY WITH NEEDLE LOCALIZATION AND AXILLARY SENTINEL LYMPH NODE BX Right 06/11/2013   Procedure: BREAST LUMPECTOMY WITH NEEDLE LOCALIZATION AND AXILLARY SENTINEL LYMPH NODE BX;  Surgeon: Harl Bowie, MD;  Location: [facility];  Service: General;  Laterality: Right;  Needle localization BCG 7:30   COLONOSCOPY W/ BIOPSIES AND POLYPECTOMY  multiple   CYSTOSCOPY WITH STENT PLACEMENT Left 08/15/2019   Procedure: CYSTOSCOPY, left retrograde pyleogram  WITH STENT EXCHANGE left and foley placement;  Surgeon: Franchot Gallo, MD;  Location: [facility];  Service: Urology;  Laterality: Left;   IR URETERAL STENT LEFT NEW ACCESS W/O SEP  NEPHROSTOMY CATH  08/12/2019   KNEE ARTHROSCOPY Left 2006   NEPHROLITHOTOMY Left 08/12/2019   Procedure: NEPHROLITHOTOMY PERCUTANEOUS/INSERTION DOUBLE J STENT;  Surgeon: Franchot Gallo, MD;  Location: [facility];  Service: Urology;  Laterality: Left;  2 HRS   TOTAL KNEE ARTHROPLASTY Right 08/28/2018   Procedure: RIGHT TOTAL KNEE ARTHROPLASTY;  Surgeon: Garald Balding, MD;  Location: [facility];  Service: Orthopedics;  Laterality: Right;     Current Outpatient Medications  Medication Sig Dispense Refill   atorvastatin (LIPITOR) 10 MG tablet Take 10 mg by mouth at bedtime.      buPROPion (WELLBUTRIN XL) 300 MG 24 hr tablet 1 tablet in the morning Orally Once a day 30  tablet 0   Calcium-Phosphorus-Vitamin D (CITRACAL +D3 PO) Take 1 tablet by mouth at bedtime.     Echinacea 450 MG CAPS Take 450 mg by mouth daily.     fluticasone (FLONASE) 50 MCG/ACT nasal spray Place 2 sprays into the nose daily.      hyoscyamine (LEVSIN SL) 0.125 MG SL tablet Take 1 tablet by mouth every 6-8 hours as needed for abdominal pain 60 tablet 2   ibuprofen (ADVIL) 200 MG tablet Take 400 mg by mouth every 8 (eight) hours as needed (pain.).     letrozole (FEMARA) 2.5 MG tablet Take 1 tablet (2.5 mg total) by mouth daily. 90 tablet 3   metoprolol tartrate (LOPRESSOR) 25 MG tablet Take 0.5 tablets (12.5 mg total) by mouth 2 (two) times daily.     Multiple Vitamin (MULTIVITAMIN WITH MINERALS) TABS tablet Take 1 tablet by mouth daily.     Probiotic Product (PROBIOTIC DAILY PO) Take 1 capsule by mouth daily.     senna (SENOKOT) 8.6 MG tablet Take 1-2 tablets by mouth daily as needed for constipation.     SYNTHROID 75 MCG tablet TAKE 1 TABLET IN THE MORNING ON AN EMPTY STOMACH. 30 30 tablet 3   Turmeric (QC TUMERIC COMPLEX PO) Take by mouth. Patient takes once daily.     zolpidem (AMBIEN) 10 MG tablet Take 2.5 mg by mouth at bedtime as needed for sleep.      No current facility-administered medications for this visit.    Allergies:   Dilaudid [hydromorphone hcl], Terak [terramycin], and Amoxicillin    ROS:  Please see the history of present illness.   Otherwise, review of systems are positive for none.   All other systems are reviewed and negative.    PHYSICAL EXAM: VS:  BP 128/74   Pulse (!) 46   Ht 5' 5.5" (1.664 m)   Wt 133 lb (60.3 kg)   SpO2 98%   BMI 21.80 kg/m  , BMI Body mass index is 21.8 kg/m. GENERAL:  Well appearing NECK:  No jugular venous distention, waveform within normal limits, carotid upstroke brisk and symmetric, no bruits, no thyromegaly LUNGS:  Clear to auscultation bilaterally CHEST:  Unremarkable HEART:  PMI not displaced or sustained,S1 and S2 within  normal limits, no S3, no S4, no clicks, no rubs, 3 of 6 apical systolic murmur radiating up the aortic outflow tract, no diastolic murmurs ABD:  Flat, positive bowel sounds normal in frequency in pitch, no bruits, no rebound, no guarding, no midline pulsatile mass, no hepatomegaly, no splenomegaly EXT:  2 plus pulses throughout, no edema, no cyanosis no clubbing  EKG:  EKG is  ordered today. The ekg ordered today demonstrates sinus rhythm, rate 46, axis within normal limits.  Intervals within normal limits, no acute ST-T wave  changes.   Recent Labs: 03/11/2021: ALT 13; BUN 17; Creatinine, Ser 0.95; Hemoglobin 12.8; Platelets 240.0; Potassium 4.2; Sodium 141    Lipid Panel No results found for: CHOL, TRIG, HDL, CHOLHDL, VLDL, LDLCALC, LDLDIRECT    Wt Readings from Last 3 Encounters:  07/14/21 133 lb (60.3 kg)  03/11/21 136 lb (61.7 kg)  01/27/21 134 lb 11.2 oz (61.1 kg)      Other studies Reviewed: Additional studies/ records that were reviewed today include: None. Review of the above records demonstrates:  NA   ASSESSMENT AND PLAN:  CAD The patient has no new sypmtoms.  No further cardiovascular testing is indicated.  We will continue with aggressive risk reduction and meds as listed.she had branch vessel disease.  She does have coronary calcium but a nega POET (Plain Old Exercise Treadmill).  She will continue with risk reduction.   Mitral valve disorder She has had some mitral regurgitation so no prolapse.  I will check an echocardiogram.  History of palpitations She has no longer having palpitations.  She will continue low-dose beta-blocker.  She has bradycardia but no symptoms related to this.    Current medicines are reviewed at length with the patient today.  The patient does not have concerns regarding medicines.  The following changes have been made:  None  Labs/ tests ordered today include:    Orders Placed This Encounter  Procedures   EKG 12-Lead    ECHOCARDIOGRAM COMPLETE      Disposition:   FU with me in 12 months.Ronnell Guadalajara, MD  07/14/2021 2:53 PM    [facility]

## 2021-07-14 ENCOUNTER — Other Ambulatory Visit: Payer: Self-pay

## 2021-07-14 ENCOUNTER — Encounter: Payer: Self-pay | Admitting: Cardiology

## 2021-07-14 ENCOUNTER — Ambulatory Visit: Payer: Medicare Other | Admitting: Cardiology

## 2021-07-14 VITALS — BP 128/74 | HR 46 | Ht 65.5 in | Wt 133.0 lb

## 2021-07-14 DIAGNOSIS — I059 Rheumatic mitral valve disease, unspecified: Secondary | ICD-10-CM

## 2021-07-14 DIAGNOSIS — E785 Hyperlipidemia, unspecified: Secondary | ICD-10-CM | POA: Diagnosis not present

## 2021-07-14 DIAGNOSIS — R002 Palpitations: Secondary | ICD-10-CM | POA: Diagnosis not present

## 2021-07-14 DIAGNOSIS — R0602 Shortness of breath: Secondary | ICD-10-CM | POA: Diagnosis not present

## 2021-07-14 NOTE — Patient Instructions (Signed)
Medication Instructions:  Your Physician recommend you continue on your current medication as directed.    *If you need a refill on your cardiac medications before your next appointment, please call your pharmacy*   Lab Work: None ordered today   Testing/Procedures: Your physician has requested that you have an echocardiogram in November 2022. Echocardiography is a painless test that uses sound waves to create images of your heart. It provides your doctor with information about the size and shape of your heart and how well your heart's chambers and valves are working. This procedure takes approximately one hour. There are no restrictions for this procedure. [place] [address]    Follow-Up: At [facility], you and your health needs are our priority.  As part of our continuing mission to provide you with exceptional heart care, we have created designated Provider Care Teams.  These Care Teams include your primary Cardiologist (physician) and Advanced Practice Providers (APPs -  Physician Assistants and Nurse Practitioners) who all work together to provide you with the care you need, when you need it.  We recommend signing up for the patient portal called "MyChart".  Sign up information is provided on this After Visit Summary.  MyChart is used to connect with patients for Virtual Visits (Telemedicine).  Patients are able to view lab/test results, encounter notes, upcoming appointments, etc.  Non-urgent messages can be sent to your provider as well.   To learn more about what you can do with MyChart, go to [email or web address].    Your next appointment:   1 year(s)  The format for your next appointment:   In Person  Provider:   Minus Breeding, MD

## 2021-08-31 ENCOUNTER — Other Ambulatory Visit: Payer: Self-pay | Admitting: Family Medicine

## 2021-09-27 ENCOUNTER — Ambulatory Visit (HOSPITAL_COMMUNITY): Payer: Medicare Other | Attending: Cardiology

## 2021-09-27 ENCOUNTER — Other Ambulatory Visit: Payer: Self-pay

## 2021-09-27 DIAGNOSIS — I059 Rheumatic mitral valve disease, unspecified: Secondary | ICD-10-CM | POA: Diagnosis present

## 2021-09-27 LAB — ECHOCARDIOGRAM COMPLETE
Area-P 1/2: 2.22 cm2
P 1/2 time: 297 msec
S' Lateral: 2.3 cm

## 2021-10-01 ENCOUNTER — Encounter: Payer: Self-pay | Admitting: *Deleted

## 2021-10-21 DIAGNOSIS — R9389 Abnormal findings on diagnostic imaging of other specified body structures: Secondary | ICD-10-CM | POA: Diagnosis not present

## 2021-10-21 DIAGNOSIS — E039 Hypothyroidism, unspecified: Secondary | ICD-10-CM | POA: Diagnosis not present

## 2021-10-21 DIAGNOSIS — E7800 Pure hypercholesterolemia, unspecified: Secondary | ICD-10-CM | POA: Diagnosis not present

## 2021-10-21 DIAGNOSIS — C50919 Malignant neoplasm of unspecified site of unspecified female breast: Secondary | ICD-10-CM | POA: Diagnosis not present

## 2021-10-21 DIAGNOSIS — N200 Calculus of kidney: Secondary | ICD-10-CM | POA: Diagnosis not present

## 2021-11-03 ENCOUNTER — Ambulatory Visit
Admission: RE | Admit: 2021-11-03 | Discharge: 2021-11-03 | Disposition: A | Discharge status: Home / Self Care | Payer: Medicare Other | Source: Ambulatory Visit | Attending: Hematology and Oncology | Admitting: Hematology and Oncology

## 2021-11-03 DIAGNOSIS — Z1231 Encounter for screening mammogram for malignant neoplasm of breast: Secondary | ICD-10-CM | POA: Diagnosis not present

## 2021-11-03 DIAGNOSIS — Z780 Asymptomatic menopausal state: Secondary | ICD-10-CM | POA: Diagnosis not present

## 2021-11-03 DIAGNOSIS — M810 Age-related osteoporosis without current pathological fracture: Secondary | ICD-10-CM | POA: Diagnosis not present

## 2021-11-03 DIAGNOSIS — M8580 Other specified disorders of bone density and structure, unspecified site: Secondary | ICD-10-CM

## 2021-11-03 DIAGNOSIS — M85852 Other specified disorders of bone density and structure, left thigh: Secondary | ICD-10-CM | POA: Diagnosis not present

## 2021-11-26 ENCOUNTER — Encounter: Payer: Self-pay | Admitting: Hematology and Oncology

## 2021-11-30 ENCOUNTER — Other Ambulatory Visit (HOSPITAL_COMMUNITY): Payer: Self-pay | Admitting: Urology

## 2021-11-30 ENCOUNTER — Telehealth: Payer: Self-pay | Admitting: Hematology and Oncology

## 2021-11-30 ENCOUNTER — Other Ambulatory Visit: Payer: Self-pay | Admitting: Urology

## 2021-11-30 DIAGNOSIS — N200 Calculus of kidney: Secondary | ICD-10-CM

## 2021-11-30 NOTE — Telephone Encounter (Signed)
Sch per 1/9 inbasket, pt awarre

## 2021-11-30 NOTE — Progress Notes (Signed)
Sent message, via epic in basket, requesting orders in epic from surgeon.  

## 2021-12-03 NOTE — Patient Instructions (Signed)
DUE TO COVID-19 ONLY ONE VISITOR IS ALLOWED TO COME WITH YOU AND STAY IN THE WAITING ROOM ONLY DURING PRE OP AND PROCEDURE.   **NO VISITORS ARE ALLOWED IN THE SHORT STAY AREA OR RECOVERY ROOM!!**  IF YOU WILL BE ADMITTED INTO THE HOSPITAL YOU ARE ALLOWED ONLY TWO SUPPORT PEOPLE DURING VISITATION HOURS ONLY (7 AM -8PM)   The support person(s) must pass our screening, gel in and out, and wear a mask at all times, including in the patients room. Patients must also wear a mask when staff or their support person are in the room. Visitors GUEST BADGE MUST BE WORN VISIBLY  One adult visitor may remain with you overnight and MUST be in the room by 8 P.M.  No visitors under the age of 66. Any visitor under the age of 61 must be accompanied by an adult.    COVID SWAB TESTING MUST BE COMPLETED ON:  12/07/21   Site: [facility] [place] [place] Enter: Main Entrance have a seat in the waiting area to the right of main entrance (DO NOT [place]!!!!!) Dial: [phone] to alert staff you have arrived  You are not required to quarantine, however you are required to wear a well-fitted mask when you are out and around people not in your household.  Hand Hygiene often Do NOT share personal items Notify your provider if you are in close contact with someone who has COVID or you develop fever 100.4 or greater, new onset of sneezing, cough, sore throat, shortness of breath or body aches.  [place], [address], must go inside of the hospital, NOT A DRIVE THRU!  (Must self quarantine after testing. Follow instructions on handout.)       Your procedure is scheduled on: 12/09/21   Report to [facility] Main Entrance    Report to admitting at: 7:30 AM   Call this number if you have problems the morning of surgery [phone]   Do not eat food :After Midnight.   May have liquids until: 9:30 AM     day of surgery  CLEAR LIQUID DIET  Foods Allowed                                                                     Foods Excluded  Water, Black Coffee and tea, regular and decaf                             liquids that you cannot  Plain Jell-O in any flavor  (No red)                                           see through such as: Fruit ices (not with fruit pulp)                                     milk, soups, orange juice  Iced Popsicles (No red)                                    All solid food                                   Apple juices Sports drinks like Gatorade (No red) Lightly seasoned clear broth or consume(fat free) Sugar Sample Menu Breakfast                                Lunch                                     Supper Cranberry juice                    Beef broth                            Chicken broth Jell-O                                     Grape juice                           Apple juice Coffee or tea                        Jell-O                                      Popsicle                                                Coffee or tea                        Coffee or tea   Oral Hygiene is also important to reduce your risk of infection.                                    Remember - BRUSH YOUR TEETH THE MORNING OF SURGERY WITH YOUR REGULAR TOOTHPASTE   Do NOT smoke after Midnight   Take these medicines the morning of surgery with A SIP OF WATER: bupropion,letrozole(femara),metoprolol,synthroid.Use flonase as usual.  DO NOT TAKE ANY ORAL DIABETIC MEDICATIONS DAY OF YOUR SURGERY                              You may not have any metal on your body including hair pins, jewelry, and body piercing             Do not wear make-up, lotions, powders, perfumes/cologne, or deodorant  Do not wear nail polish including gel and S&S, artificial/acrylic nails,  or any other type of covering on natural nails including finger and toenails. If you have artificial  nails, gel coating, etc. that needs to be removed by a nail salon please have this removed prior to surgery or surgery may need to be canceled/ delayed if the surgeon/ anesthesia feels like they are unable to be safely monitored.   Do not shave  48 hours prior to surgery.    Do not bring valuables to the hospital. [place].   Contacts, dentures or bridgework may not be worn into surgery.   Bring small overnight bag day of surgery.    Patients discharged on the day of surgery will not be allowed to drive home.  Someone needs to stay with you for the first 24 hours after anesthesia.   Special Instructions: Bring a copy of your healthcare power of attorney and living will documents         the day of surgery if you haven't scanned them before.              Please read over the following fact sheets you were given: IF YOU HAVE QUESTIONS ABOUT YOUR PRE-OP INSTRUCTIONS PLEASE CALL [phone]     [facility] - Preparing for Surgery Before surgery, you can play an important role.  Because skin is not sterile, your skin needs to be as free of germs as possible.  You can reduce the number of germs on your skin by washing with CHG (chlorahexidine gluconate) soap before surgery.  CHG is an antiseptic cleaner which kills germs and bonds with the skin to continue killing germs even after washing. Please DO NOT use if you have an allergy to CHG or antibacterial soaps.  If your skin becomes reddened/irritated stop using the CHG and inform your nurse when you arrive at Short Stay. Do not shave (including legs and underarms) for at least 48 hours prior to the first CHG shower.  You may shave your face/neck. Please follow these instructions carefully:  1.  Shower with CHG Soap the night before surgery and the  morning of Surgery.  2.  If you choose to wash your hair, wash your hair first as usual with your  normal  shampoo.  3.  After you shampoo, rinse your  hair and body thoroughly to remove the  shampoo.                           4.  Use CHG as you would any other liquid soap.  You can apply chg directly  to the skin and wash                       Gently with a scrungie or clean washcloth.  5.  Apply the CHG Soap to your body ONLY FROM THE NECK DOWN.   Do not use on face/ open                           Wound or open sores. Avoid contact with eyes, ears mouth and genitals (private parts).                       Wash face,  Genitals (private parts) with your normal soap.  6.  Wash thoroughly, paying special attention to the area where your surgery  will be performed.  7.  Thoroughly rinse your body with warm water from the neck down.  8.  DO NOT shower/wash with your normal soap after using and rinsing off  the CHG Soap.                9.  Pat yourself dry with a clean towel.            10.  Wear clean pajamas.            11.  Place clean sheets on your bed the night of your first shower and do not  sleep with pets. Day of Surgery : Do not apply any lotions/deodorants the morning of surgery.  Please wear clean clothes to the hospital/surgery center.  FAILURE TO FOLLOW THESE INSTRUCTIONS MAY RESULT IN THE CANCELLATION OF YOUR SURGERY PATIENT SIGNATURE_________________________________  NURSE SIGNATURE__________________________________  ________________________________________________________________________

## 2021-12-06 ENCOUNTER — Encounter (HOSPITAL_COMMUNITY)
Admission: RE | Admit: 2021-12-06 | Discharge: 2021-12-06 | Disposition: A | Discharge status: Home / Self Care | Payer: Medicare Other | Source: Ambulatory Visit | Attending: Urology | Admitting: Urology

## 2021-12-06 ENCOUNTER — Encounter (HOSPITAL_COMMUNITY): Payer: Self-pay

## 2021-12-06 ENCOUNTER — Other Ambulatory Visit: Payer: Self-pay

## 2021-12-06 VITALS — BP 123/73 | HR 41 | Temp 97.7°F | Resp 18 | Ht 64.0 in | Wt 132.5 lb

## 2021-12-06 DIAGNOSIS — E0500 Thyrotoxicosis with diffuse goiter without thyrotoxic crisis or storm: Secondary | ICD-10-CM | POA: Insufficient documentation

## 2021-12-06 DIAGNOSIS — I2510 Atherosclerotic heart disease of native coronary artery without angina pectoris: Secondary | ICD-10-CM | POA: Insufficient documentation

## 2021-12-06 DIAGNOSIS — I341 Nonrheumatic mitral (valve) prolapse: Secondary | ICD-10-CM | POA: Diagnosis not present

## 2021-12-06 DIAGNOSIS — Z86711 Personal history of pulmonary embolism: Secondary | ICD-10-CM | POA: Diagnosis not present

## 2021-12-06 DIAGNOSIS — Z853 Personal history of malignant neoplasm of breast: Secondary | ICD-10-CM | POA: Diagnosis not present

## 2021-12-06 DIAGNOSIS — Z01812 Encounter for preprocedural laboratory examination: Secondary | ICD-10-CM | POA: Diagnosis present

## 2021-12-06 DIAGNOSIS — I10 Essential (primary) hypertension: Secondary | ICD-10-CM | POA: Diagnosis not present

## 2021-12-06 DIAGNOSIS — Z87442 Personal history of urinary calculi: Secondary | ICD-10-CM | POA: Insufficient documentation

## 2021-12-06 DIAGNOSIS — N200 Calculus of kidney: Secondary | ICD-10-CM | POA: Diagnosis not present

## 2021-12-06 HISTORY — DX: Cardiac arrhythmia, unspecified: I49.9

## 2021-12-06 LAB — CBC
HCT: 43.2 % (ref 36.0–46.0)
Hemoglobin: 14 g/dL (ref 12.0–15.0)
MCH: 33.3 pg (ref 26.0–34.0)
MCHC: 32.4 g/dL (ref 30.0–36.0)
MCV: 102.6 fL — ABNORMAL HIGH (ref 80.0–100.0)
Platelets: 214 10*3/uL (ref 150–400)
RBC: 4.21 MIL/uL (ref 3.87–5.11)
RDW: 12.8 % (ref 11.5–15.5)
WBC: 3.5 10*3/uL — ABNORMAL LOW (ref 4.0–10.5)
nRBC: 0 % (ref 0.0–0.2)

## 2021-12-06 LAB — BASIC METABOLIC PANEL
Anion gap: 5 (ref 5–15)
BUN: 18 mg/dL (ref 8–23)
CO2: 27 mmol/L (ref 22–32)
Calcium: 8.8 mg/dL — ABNORMAL LOW (ref 8.9–10.3)
Chloride: 105 mmol/L (ref 98–111)
Creatinine, Ser: 0.88 mg/dL (ref 0.44–1.00)
GFR, Estimated: >60 mL/min (ref >60)
Glucose, Bld: 77 mg/dL (ref 70–99)
Potassium: 4.2 mmol/L (ref 3.5–5.1)
Sodium: 137 mmol/L (ref 135–145)

## 2021-12-06 NOTE — Progress Notes (Addendum)
COVID Vaccine Completed: Yes Date COVID Vaccine completed: 05/05/21 x 4 COVID vaccine manufacturer: [employer]    COVID Test: 12/07/21 @ 1:30 PM PCP - Dr. Mckinley Jewel Cardiologist - Dr. Minus Breeding. LOV: 07/14/21  Chest x-ray -  EKG - 07/14/21 Stress Test -  ECHO - 09/27/21 Cardiac Cath -  Pacemaker/ICD device last checked:  Sleep Study -  CPAP -   Fasting Blood Sugar -  Checks Blood Sugar _____ times a day  Blood Thinner Instructions: Aspirin Instructions: Last Dose:  Anesthesia review: Hx: HTN,mitral valve prolapse,PE,PVC's.  Patient denies shortness of breath, fever, cough and chest pain at PAT appointment   Patient verbalized understanding of instructions that were given to them at the PAT appointment. Patient was also instructed that they will need to review over the PAT instructions again at home before surgery.

## 2021-12-06 NOTE — Progress Notes (Signed)
Patient Care Team: Ollen Bowl, MD as PCP - General (Internal Medicine) Rollene Rotunda, MD as PCP - Cardiology (Cardiology)  DIAGNOSIS:    ICD-10-CM   1. Malignant neoplasm of upper-outer quadrant of right breast in female, estrogen receptor positive (HCC)  C50.411    Z17.0     2. Age-related osteoporosis without current pathological fracture  M81.0 CMP (Cancer Center only)      SUMMARY OF ONCOLOGIC HISTORY: Oncology History  Breast cancer of upper-outer quadrant of right female breast (HCC)  04/23/2013 Initial Diagnosis   Right breast calcifications stereotactic biopsy revealed invasive lobular cancer with LCIS, lymph node benign, ER positive PR negative Ki-67 10%   06/11/2013 Surgery   Right breast lumpectomy: Lobular carcinoma in situ, no invasive disease found, sentinel nodes negative   08/06/2013 - 09/04/2013 Radiation Therapy   Adjuvant radiation therapy   10/03/2013 - 12/07/2021 Anti-estrogen oral therapy   Tamoxifen 20 mg daily, switched to letrozole 10/15/18 due to PE     CHIEF COMPLIANT: Follow-up of right breast cancer on letrozole  INTERVAL HISTORY: [name] is a 76 y.o. with above-mentioned history of right breast cancer who is currently on antiestrogen therapy with letrozole. Mammogram on 11/03/21 showed no evidence of malignancy bilaterally. She presents to the clinic today for annual follow-up.  She is here today to discuss results of bone density.  Her bone density has gotten worse from -2.4 to -2.9.  Previously she was recommended to receive bisphosphonate therapy but she had tried to postpone it and finally she is here today to discuss initiating/treatment.  She has also completed a total of 9 years of antiestrogen therapy and is keen to come off letrozole.  ALLERGIES:  is allergic to dilaudid [hydromorphone hcl], terak [terramycin], and amoxicillin.  MEDICATIONS:  Current Outpatient Medications  Medication Sig Dispense Refill   atorvastatin  (LIPITOR) 10 MG tablet Take 10 mg by mouth at bedtime.      buPROPion (WELLBUTRIN XL) 300 MG 24 hr tablet 1 tablet in the morning Orally Once a day 30 tablet 0   Calcium Carb-Cholecalciferol (CALCIUM 600 + D PO) Take 1 tablet by mouth at bedtime.     ECHINACEA PO Take 1,300 mg by mouth daily as needed (immune support).     fluticasone (FLONASE) 50 MCG/ACT nasal spray Place 2 sprays into the nose daily.      hyoscyamine (LEVSIN SL) 0.125 MG SL tablet Take 1 tablet by mouth every 6-8 hours as needed for abdominal pain (Patient taking differently: Take 0.125 mg by mouth every 6 (six) hours as needed (heartburn).) 60 tablet 2   ibuprofen (ADVIL) 200 MG tablet Take 200-400 mg by mouth every 8 (eight) hours as needed (pain.).     metoprolol tartrate (LOPRESSOR) 25 MG tablet Take 0.5 tablets (12.5 mg total) by mouth 2 (two) times daily.     Multiple Vitamin (MULTIVITAMIN WITH MINERALS) TABS tablet Take 1 tablet by mouth daily.     polyethylene glycol (MIRALAX / GLYCOLAX) 17 g packet Take 17 g by mouth daily as needed for moderate constipation.     Probiotic Product (PROBIOTIC DAILY PO) Take 1 capsule by mouth daily.     senna (SENOKOT) 8.6 MG tablet Take 1 tablet by mouth daily as needed for constipation.     Sod Fluoride-Potassium Nitrate (PREVIDENT 5000 ENAMEL PROTECT) 1.1-5 % GEL Place 1 application onto teeth daily.     SYNTHROID 75 MCG tablet TAKE ONE TABLET EVERY MORNING ON AN EMPTY STOMACH  30 tablet 3   Turmeric (QC TUMERIC COMPLEX PO) Take 1,500 mg by mouth daily.     zolpidem (AMBIEN) 10 MG tablet Take 2.5 mg by mouth at bedtime as needed for sleep.      No current facility-administered medications for this visit.    PHYSICAL EXAMINATION: ECOG PERFORMANCE STATUS: 1 - Symptomatic but completely ambulatory  Vitals:   12/07/21 1530  BP: 122/60  Pulse: 60  Resp: 18  Temp: 98.1 F (36.7 C)  SpO2: 100%   Filed Weights   12/07/21 1530  Weight: 133 lb 11.2 oz (60.6 kg)      LABORATORY  DATA:  I have reviewed the data as listed CMP Latest Ref Rng & Units 12/06/2021 03/11/2021 08/27/2020  Glucose 70 - 99 mg/dL 77 85 82  BUN 8 - 23 mg/dL $Remove'18 17 14  'IybwdIE$ Creatinine 0.44 - 1.00 mg/dL 0.88 0.95 0.89  Sodium 135 - 145 mmol/L 137 141 140  Potassium 3.5 - 5.1 mmol/L 4.2 4.2 3.7  Chloride 98 - 111 mmol/L 105 105 105  CO2 22 - 32 mmol/L $RemoveB'27 30 28  'oUrSsAtd$ Calcium 8.9 - 10.3 mg/dL 8.8(L) 9.4 9.3  Total Protein 6.0 - 8.3 g/dL - 6.7 6.9  Total Bilirubin 0.2 - 1.2 mg/dL - 0.4 0.4  Alkaline Phos 39 - 117 U/L - 71 71  AST 0 - 37 U/L - 17 19  ALT 0 - 35 U/L - 13 14    Lab Results  Component Value Date   WBC 3.5 (L) 12/06/2021   HGB 14.0 12/06/2021   HCT 43.2 12/06/2021   MCV 102.6 (H) 12/06/2021   PLT 214 12/06/2021   NEUTROABS 3.0 03/11/2021    ASSESSMENT & PLAN:  Breast cancer of upper-outer quadrant of right female breast ([place]) Right breast invasive lobular cancer with LCIS T1 N0 M0 stage IA status post lumpectomy which did not show any further evidence of invasive breast cancer. ER positive PR positive HER-2 negative, received adjuvant radiation completed 09/04/2013, started her tamoxifen 20 mg daily from November 2013-10/15/2018, switched to letrozole completed 12/07/2021   Letrozole toxicities:  Denies any hot flashes or significant increase in arthralgias or myalgias. Recommended discontinuation of letrozole therapy at this time.   Breast cancer surveillance: 1. Breast exam 12/07/2021 does not reveal any palpable lumps or nodules of concern. 2. Mammogram 11/03/2021: Benign, breast density category B 3. Breast MRI 06/25/2018: Negative, we do not plan to do regular MRIs     Pulmonary embolism 10/03/2018: Treated with 6 months of Xarelto (knee replacement and tamoxifen)   Osteoporosis: Bone density 11/03/2021: T score -2.9.  Previously bone density showed a T score of -2.4: She does exercise and calcium and vitamin D.  I recommended bisphosphonate therapy We will start her on Prolia  injections every 6 months for 2 years and then we will assess with another bone density and then determine if he can get up to 1 year break and then restart it again after that. I discussed the pros and cons of Prolia and she understands risks and benefits and is willing to proceed.   Return to clinic in 1 year for follow-up    Orders Placed This Encounter  Procedures   CMP ([place] only)    Standing Status:   Future    Standing Expiration Date:   12/07/2022   The patient has a good understanding of the overall plan. she agrees with it. she will call with any problems that may develop before the next  visit here.  Total time spent: 30 mins including face to face time and time spent for planning, charting and coordination of care  Rulon Eisenmenger, MD, MPH 12/07/2021  I, Thana Ates, am acting as scribe for Dr. Nicholas Lose.  I have reviewed the above documentation for accuracy and completeness, and I agree with the above.

## 2021-12-06 NOTE — Progress Notes (Signed)
Anesthesia Chart Review   Case: [id number] Date/Time: 12/09/21 1215   Procedures:      NEPHROLITHOTOMY PERCUTANEOUS (Left) - 2 HRS     HOLMIUM LASER APPLICATION (Left)   Anesthesia type: General   Pre-op diagnosis: LEFT RENAL CALCULI   Location: [facility] [facility]   Surgeons: Franchot Gallo, MD       DISCUSSION:76 y.o. never smoker with h/o PONV, HTN, MVP, CAD, Graves disease, right breast cancer, PE, left renal calculi scheduled for above procedure 12/09/2021 with Dr. Franchot Gallo.   Pt last seen by cardiology 07/14/2021. Per OV note, "The patient has no new sypmtoms.  No further cardiovascular testing is indicated.  We will continue with aggressive risk reduction and meds as listed.she had branch vessel disease.  She does have coronary calcium but a nega POET (Plain Old Exercise Treadmill).  She will continue with risk reduction."  Anticipate pt can proceed with planned procedure barring acute status change.   VS: BP 123/73    Pulse (!) 41    Temp 36.5 C (Oral)    Resp 18    Ht 5\' 4"  (1.626 m)    Wt 60.1 kg    SpO2 100%    BMI 22.74 kg/m   PROVIDERS: Pahwani, Michell Heinrich, MD is PCP   Minus Breeding, MD is Cardiologist  LABS: Labs reviewed: Acceptable for surgery. (all labs ordered are listed, but only abnormal results are displayed)  Labs Reviewed  CBC - Abnormal; Notable for the following components:      Result Value   WBC 3.5 (*)    MCV 102.6 (*)    All other components within normal limits  BASIC METABOLIC PANEL - Abnormal; Notable for the following components:   Calcium 8.8 (*)    All other components within normal limits  TYPE AND SCREEN     IMAGES:   EKG: 07/14/2021 Rate 46 bpm  Sinus bradycardia   CV: Echo 09/27/2021 1. Left ventricular ejection fraction, by estimation, is 60 to 65%. The  left ventricle has normal function. The left ventricle has no regional  wall motion abnormalities. Left ventricular diastolic parameters were  normal.   2.  Right ventricular systolic function is normal. The right ventricular  size is normal. There is normal pulmonary artery systolic pressure. The  estimated right ventricular systolic pressure is 86.5 mmHg.   3. The mitral valve is normal in structure. Mild mitral valve  regurgitation. No evidence of mitral stenosis.   4. The aortic valve is tricuspid. Aortic valve regurgitation is trivial.  No aortic stenosis is present.   5. Pulmonic valve regurgitation is moderate.   6. The inferior vena cava is normal in size with greater than 50%  respiratory variability, suggesting right atrial pressure of 3 mmHg.  Past Medical History:  Diagnosis Date   Allergic rhinitis    Arthritis    "knees" (08/28/2018)   Breast cancer, right breast ([place]) 05/20/2013   "no chemo" (08/28/2018)   Chronic lower back pain    Diverticulosis    DOE (dyspnea on exertion)    Dysrhythmia    Graves' disease    2011   Heart palpitations    History of kidney stones    HLD (hyperlipidemia)    Hx of radiation therapy 08/13/13- 09/06/13   right breast 4250 cGy 17 sessions   Hypertension    IBS (irritable bowel syndrome)    Lumbar herniated disc    L4-L5   Major depression  MVP (mitral valve prolapse)    Osteopenia    Personal history of radiation therapy    Pneumonia 08/2019   PONV (postoperative nausea and vomiting)    "when I have general anesthetic" (08/28/2018)   Pulmonary emboli (HCC)    after knee replacement   PVC (premature ventricular contraction)    Takes metoprolol PRN for  pvc's   Tubular adenoma of colon 03/07/2012    Past Surgical History:  Procedure Laterality Date   BREAST BIOPSY Right 2014   BREAST EXCISIONAL BIOPSY Right    BREAST LUMPECTOMY WITH NEEDLE LOCALIZATION AND AXILLARY SENTINEL LYMPH NODE BX Right 06/11/2013   Procedure: BREAST LUMPECTOMY WITH NEEDLE LOCALIZATION AND AXILLARY SENTINEL LYMPH NODE BX;  Surgeon: Harl Bowie, MD;  Location: [place];  Service: General;  Laterality:  Right;  Needle localization BCG 7:30   COLONOSCOPY W/ BIOPSIES AND POLYPECTOMY  multiple   CYSTOSCOPY WITH STENT PLACEMENT Left 08/15/2019   Procedure: CYSTOSCOPY, left retrograde pyleogram  WITH STENT EXCHANGE left and foley placement;  Surgeon: Franchot Gallo, MD;  Location: [facility];  Service: Urology;  Laterality: Left;   IR URETERAL STENT LEFT NEW ACCESS W/O SEP NEPHROSTOMY CATH  08/12/2019   KNEE ARTHROSCOPY Left 2006   NEPHROLITHOTOMY Left 08/12/2019   Procedure: NEPHROLITHOTOMY PERCUTANEOUS/INSERTION DOUBLE J STENT;  Surgeon: Franchot Gallo, MD;  Location: [facility];  Service: Urology;  Laterality: Left;  2 HRS   TOTAL KNEE ARTHROPLASTY Right 08/28/2018   Procedure: RIGHT TOTAL KNEE ARTHROPLASTY;  Surgeon: Garald Balding, MD;  Location: [place];  Service: Orthopedics;  Laterality: Right;    MEDICATIONS:  atorvastatin (LIPITOR) 10 MG tablet   buPROPion (WELLBUTRIN XL) 300 MG 24 hr tablet   Calcium Carb-Cholecalciferol (CALCIUM 600 + D PO)   ECHINACEA PO   fluticasone (FLONASE) 50 MCG/ACT nasal spray   hyoscyamine (LEVSIN SL) 0.125 MG SL tablet   ibuprofen (ADVIL) 200 MG tablet   letrozole (FEMARA) 2.5 MG tablet   metoprolol tartrate (LOPRESSOR) 25 MG tablet   Multiple Vitamin (MULTIVITAMIN WITH MINERALS) TABS tablet   polyethylene glycol (MIRALAX / GLYCOLAX) 17 g packet   Probiotic Product (PROBIOTIC DAILY PO)   senna (SENOKOT) 8.6 MG tablet   Sod Fluoride-Potassium Nitrate (PREVIDENT 5000 ENAMEL PROTECT) 1.1-5 % GEL   SYNTHROID 75 MCG tablet   Turmeric (QC TUMERIC COMPLEX PO)   zolpidem (AMBIEN) 10 MG tablet   No current facility-administered medications for this encounter.   Konrad Felix Ward, PA-C [facility] [phone]

## 2021-12-07 ENCOUNTER — Inpatient Hospital Stay: Payer: Medicare Other | Attending: Hematology and Oncology | Admitting: Hematology and Oncology

## 2021-12-07 ENCOUNTER — Encounter (HOSPITAL_COMMUNITY)
Admission: RE | Admit: 2021-12-07 | Discharge: 2021-12-07 | Disposition: A | Discharge status: Home / Self Care | Payer: Medicare Other | Source: Ambulatory Visit | Attending: Urology | Admitting: Urology

## 2021-12-07 DIAGNOSIS — Z79899 Other long term (current) drug therapy: Secondary | ICD-10-CM | POA: Diagnosis not present

## 2021-12-07 DIAGNOSIS — Z923 Personal history of irradiation: Secondary | ICD-10-CM | POA: Diagnosis not present

## 2021-12-07 DIAGNOSIS — Z7901 Long term (current) use of anticoagulants: Secondary | ICD-10-CM | POA: Insufficient documentation

## 2021-12-07 DIAGNOSIS — Z86711 Personal history of pulmonary embolism: Secondary | ICD-10-CM | POA: Insufficient documentation

## 2021-12-07 DIAGNOSIS — C50411 Malignant neoplasm of upper-outer quadrant of right female breast: Secondary | ICD-10-CM | POA: Diagnosis present

## 2021-12-07 DIAGNOSIS — Z79811 Long term (current) use of aromatase inhibitors: Secondary | ICD-10-CM | POA: Diagnosis not present

## 2021-12-07 DIAGNOSIS — Z170 Estrogen receptor positive status [ER+]: Secondary | ICD-10-CM | POA: Insufficient documentation

## 2021-12-07 DIAGNOSIS — M810 Age-related osteoporosis without current pathological fracture: Secondary | ICD-10-CM | POA: Insufficient documentation

## 2021-12-07 NOTE — Assessment & Plan Note (Signed)
Right breast invasive lobular cancer with LCIS T1 N0 M0 stage IA status post lumpectomy which did not show any further evidence of invasive breast cancer. ER positive PR positive HER-2 negative, received adjuvant radiation completed 09/04/2013, started her tamoxifen 20 mg daily from November 2013-10/15/2018, switched to letrozole  Letrozoletoxicities:Denies any hot flashes or significant increase in arthralgias or myalgias. Continue letrozole fortotal of5 years  Breast cancer surveillance: 1. Breast exam1/17/2023does not reveal any palpable lumps or nodules of concern. 2. Mammogram12/14/2022: Benign, breast density categoryB 3. Breast MRI8/03/2018: Negative,we do not plan to do regular MRIs    Pulmonary embolism 10/03/2018: Treated with 6 months of Xarelto (knee replacement and tamoxifen)  Osteoporosis: Bone density 11/03/2021: T score -2.9.  Previously bone density showed a T score of -2.4: She does exercise and calcium and vitamin D.  I recommended bisphosphonate therapy   Return to clinic in 1 year for follow-up

## 2021-12-08 ENCOUNTER — Other Ambulatory Visit: Payer: Self-pay | Admitting: Radiology

## 2021-12-08 NOTE — H&P (Signed)
H&P  Chief Complaint: Lt sided kidney stone(s)  History of Present Illness: [name] is a 76 y.o. year old female presenting at this time for percutaneous management of left renal calculi.  She underwent previous percutaneous nephrolithotomy of similar sided calculi in September, 2020.  Unfortunately, she has had significant recurrence and regrowth of these stones.  Past Medical History:  Diagnosis Date   Allergic rhinitis    Arthritis    "knees" (08/28/2018)   Breast cancer, right breast ([place]) 05/20/2013   "no chemo" (08/28/2018)   Chronic lower back pain    Diverticulosis    DOE (dyspnea on exertion)    Dysrhythmia    Graves' disease    2011   Heart palpitations    History of kidney stones    HLD (hyperlipidemia)    Hx of radiation therapy 08/13/13- 09/06/13   right breast 4250 cGy 17 sessions   Hypertension    IBS (irritable bowel syndrome)    Lumbar herniated disc    L4-L5   Major depression    MVP (mitral valve prolapse)    Osteopenia    Personal history of radiation therapy    Pneumonia 08/2019   PONV (postoperative nausea and vomiting)    "when I have general anesthetic" (08/28/2018)   Pulmonary emboli (HCC)    after knee replacement   PVC (premature ventricular contraction)    Takes metoprolol PRN for  pvc's   Tubular adenoma of colon 03/07/2012    Past Surgical History:  Procedure Laterality Date   BREAST BIOPSY Right 2014   BREAST EXCISIONAL BIOPSY Right    BREAST LUMPECTOMY WITH NEEDLE LOCALIZATION AND AXILLARY SENTINEL LYMPH NODE BX Right 06/11/2013   Procedure: BREAST LUMPECTOMY WITH NEEDLE LOCALIZATION AND AXILLARY SENTINEL LYMPH NODE BX;  Surgeon: Harl Bowie, MD;  Location: [place];  Service: General;  Laterality: Right;  Needle localization BCG 7:30   COLONOSCOPY W/ BIOPSIES AND POLYPECTOMY  multiple   CYSTOSCOPY WITH STENT PLACEMENT Left 08/15/2019   Procedure: CYSTOSCOPY, left retrograde pyleogram  WITH STENT EXCHANGE left and foley  placement;  Surgeon: Franchot Gallo, MD;  [facility];  Service: Urology;  Laterality: Left;   IR URETERAL STENT LEFT NEW ACCESS W/O SEP NEPHROSTOMY CATH  08/12/2019   KNEE ARTHROSCOPY Left 2006   NEPHROLITHOTOMY Left 08/12/2019   Procedure: NEPHROLITHOTOMY PERCUTANEOUS/INSERTION DOUBLE J STENT;  Surgeon: Franchot Gallo, MD;  [facility];  Service: Urology;  Laterality: Left;  2 HRS   TOTAL KNEE ARTHROPLASTY Right 08/28/2018   Procedure: RIGHT TOTAL KNEE ARTHROPLASTY;  Surgeon: Garald Balding, MD;  Location: [place];  Service: Orthopedics;  Laterality: Right;    Home Medications:  No medications prior to admission.    Allergies:  Allergies  Allergen Reactions   Dilaudid [Hydromorphone Hcl] Nausea And Vomiting   Terak [Terramycin] Rash   Amoxicillin Diarrhea    Has patient had a PCN reaction causing immediate rash, facial/tongue/throat swelling, SOB or lightheadedness with hypotension: No Has patient had a PCN reaction causing severe rash involving mucus membranes or skin necrosis: No Has patient had a PCN reaction that required hospitalization: No Has patient had a PCN reaction occurring within the last 10 years: Unknown If all of the above answers are "NO", then may proceed with Cephalosporin use.     Family History  Problem Relation Age of Onset   Heart disease Mother 93       MI   Lung cancer Mother    Hypertension Mother  Alcohol abuse Father    COPD Father    Colon cancer Maternal Grandfather 20   Diabetes Paternal Grandmother    Breast cancer Maternal Aunt    Pancreatic cancer Maternal Aunt    Stomach cancer Neg Hx     Social History:  reports that she has never smoked. She has never used smokeless tobacco. She reports current alcohol use. She reports that she does not use drugs.  ROS: A complete review of systems was performed.  All systems are negative except for pertinent findings as noted.  Physical Exam:  Vital signs in last 24 hours:    General:  Alert and oriented, No acute distress HEENT: Normocephalic, atraumatic Neck: No JVD or lymphadenopathy Cardiovascular: Regular rate  Lungs: Normal inspiratory/expiratory excursion Extremities: No edema Neurologic: Grossly intact  I have reviewed prior pt notes  I have reviewed urinalysis results  I have independently reviewed prior imaging  I have reviewed prior urine culture   Impression/Assessment:  Left renal calculi, large  Plan:  Left percutaneous nephrolithotomy  Lillette Boxer [name] 12/08/2021, 8:39 PM  Lillette Boxer. [name] MD

## 2021-12-09 ENCOUNTER — Encounter (HOSPITAL_COMMUNITY)
Admission: RE | Disposition: A | Discharge status: Home / Self Care | Payer: Self-pay | Source: Ambulatory Visit | Attending: Urology

## 2021-12-09 ENCOUNTER — Ambulatory Visit (HOSPITAL_COMMUNITY)
Admission: RE | Admit: 2021-12-09 | Discharge: 2021-12-09 | Disposition: A | Discharge status: Home / Self Care | Payer: Medicare Other | Source: Ambulatory Visit | Attending: Urology | Admitting: Urology

## 2021-12-09 ENCOUNTER — Observation Stay (HOSPITAL_COMMUNITY): Payer: Medicare Other

## 2021-12-09 ENCOUNTER — Other Ambulatory Visit: Payer: Self-pay

## 2021-12-09 ENCOUNTER — Encounter (HOSPITAL_COMMUNITY): Payer: Self-pay | Admitting: Urology

## 2021-12-09 ENCOUNTER — Encounter: Payer: Self-pay | Admitting: Hematology and Oncology

## 2021-12-09 ENCOUNTER — Other Ambulatory Visit (HOSPITAL_BASED_OUTPATIENT_CLINIC_OR_DEPARTMENT_OTHER): Payer: Self-pay

## 2021-12-09 ENCOUNTER — Ambulatory Visit (HOSPITAL_COMMUNITY): Payer: Medicare Other | Admitting: Anesthesiology

## 2021-12-09 ENCOUNTER — Ambulatory Visit (HOSPITAL_COMMUNITY): Payer: Medicare Other | Admitting: Physician Assistant

## 2021-12-09 ENCOUNTER — Observation Stay (HOSPITAL_COMMUNITY)
Admission: RE | Admit: 2021-12-09 | Discharge: 2021-12-10 | Disposition: A | Discharge status: Home / Self Care | Payer: Medicare Other | Source: Ambulatory Visit | Attending: Urology | Admitting: Urology

## 2021-12-09 DIAGNOSIS — I10 Essential (primary) hypertension: Secondary | ICD-10-CM | POA: Insufficient documentation

## 2021-12-09 DIAGNOSIS — Z20822 Contact with and (suspected) exposure to covid-19: Secondary | ICD-10-CM | POA: Insufficient documentation

## 2021-12-09 DIAGNOSIS — Z853 Personal history of malignant neoplasm of breast: Secondary | ICD-10-CM | POA: Insufficient documentation

## 2021-12-09 DIAGNOSIS — Z96651 Presence of right artificial knee joint: Secondary | ICD-10-CM | POA: Insufficient documentation

## 2021-12-09 DIAGNOSIS — Z86711 Personal history of pulmonary embolism: Secondary | ICD-10-CM | POA: Insufficient documentation

## 2021-12-09 DIAGNOSIS — Z79899 Other long term (current) drug therapy: Secondary | ICD-10-CM | POA: Diagnosis not present

## 2021-12-09 DIAGNOSIS — E785 Hyperlipidemia, unspecified: Secondary | ICD-10-CM | POA: Insufficient documentation

## 2021-12-09 DIAGNOSIS — N200 Calculus of kidney: Secondary | ICD-10-CM | POA: Insufficient documentation

## 2021-12-09 DIAGNOSIS — F32A Depression, unspecified: Secondary | ICD-10-CM | POA: Insufficient documentation

## 2021-12-09 DIAGNOSIS — Z01818 Encounter for other preprocedural examination: Secondary | ICD-10-CM

## 2021-12-09 DIAGNOSIS — D649 Anemia, unspecified: Secondary | ICD-10-CM | POA: Insufficient documentation

## 2021-12-09 DIAGNOSIS — K589 Irritable bowel syndrome without diarrhea: Secondary | ICD-10-CM | POA: Insufficient documentation

## 2021-12-09 DIAGNOSIS — I341 Nonrheumatic mitral (valve) prolapse: Secondary | ICD-10-CM | POA: Insufficient documentation

## 2021-12-09 HISTORY — PX: NEPHROLITHOTOMY: SHX5134

## 2021-12-09 HISTORY — PX: IR URETERAL STENT LEFT NEW ACCESS W/O SEP NEPHROSTOMY CATH: IMG6075

## 2021-12-09 LAB — PROTIME-INR
INR: 0.9 (ref 0.8–1.2)
Prothrombin Time: 12.4 seconds (ref 11.4–15.2)

## 2021-12-09 LAB — TYPE AND SCREEN
ABO/RH(D): O POS
Antibody Screen: NEGATIVE

## 2021-12-09 LAB — SARS CORONAVIRUS 2 BY RT PCR (HOSPITAL ORDER, PERFORMED IN ~~LOC~~ HOSPITAL LAB): SARS Coronavirus 2: NEGATIVE

## 2021-12-09 SURGERY — NEPHROLITHOTOMY PERCUTANEOUS
Anesthesia: General | Laterality: Left

## 2021-12-09 MED ORDER — SODIUM CHLORIDE 0.45 % IV SOLN: INTRAVENOUS | Status: DC

## 2021-12-09 MED ORDER — ACETAMINOPHEN 10 MG/ML IV SOLN
INTRAVENOUS | Status: AC
  Filled 2021-12-09: qty 100

## 2021-12-09 MED ORDER — DEXAMETHASONE SODIUM PHOSPHATE 10 MG/ML IJ SOLN
INTRAMUSCULAR | Status: DC | PRN
  Administered 2021-12-09: 5 mg via INTRAVENOUS

## 2021-12-09 MED ORDER — PROPOFOL 500 MG/50ML IV EMUL
INTRAVENOUS | Status: DC | PRN
Start: 2021-12-09 — End: 2021-12-09
  Administered 2021-12-09: 125 ug/kg/min via INTRAVENOUS

## 2021-12-09 MED ORDER — FENTANYL CITRATE (PF) 250 MCG/5ML IJ SOLN
INTRAMUSCULAR | Status: AC
  Filled 2021-12-09: qty 5

## 2021-12-09 MED ORDER — MIDAZOLAM HCL 2 MG/2ML IJ SOLN
INTRAMUSCULAR | Status: AC | PRN
  Administered 2021-12-09: 1 mg via INTRAVENOUS

## 2021-12-09 MED ORDER — SODIUM CHLORIDE 0.9 % IR SOLN
Status: DC | PRN
  Administered 2021-12-09: 9000 mL

## 2021-12-09 MED ORDER — ACETAMINOPHEN 10 MG/ML IV SOLN
1000.0000 mg | INTRAVENOUS | Status: AC
  Administered 2021-12-09: 1000 mg via INTRAVENOUS

## 2021-12-09 MED ORDER — FENTANYL CITRATE PF 50 MCG/ML IJ SOSY: 25.0000 ug | PREFILLED_SYRINGE | INTRAMUSCULAR | Status: DC | PRN

## 2021-12-09 MED ORDER — OXYBUTYNIN CHLORIDE 5 MG PO TABS: 5.0000 mg | ORAL_TABLET | Freq: Three times a day (TID) | ORAL | Status: DC | PRN

## 2021-12-09 MED ORDER — FENTANYL CITRATE PF 50 MCG/ML IJ SOSY
25.0000 ug | PREFILLED_SYRINGE | Freq: Once | INTRAMUSCULAR | Status: AC
  Administered 2021-12-09: 25 ug via INTRAVENOUS

## 2021-12-09 MED ORDER — FENTANYL CITRATE PF 50 MCG/ML IJ SOSY
PREFILLED_SYRINGE | INTRAMUSCULAR | Status: AC
  Filled 2021-12-09: qty 1

## 2021-12-09 MED ORDER — ROCURONIUM BROMIDE 10 MG/ML (PF) SYRINGE
PREFILLED_SYRINGE | INTRAVENOUS | Status: AC
  Filled 2021-12-09: qty 10

## 2021-12-09 MED ORDER — DEXAMETHASONE SODIUM PHOSPHATE 10 MG/ML IJ SOLN
INTRAMUSCULAR | Status: AC
  Filled 2021-12-09: qty 1

## 2021-12-09 MED ORDER — LIDOCAINE HCL (PF) 2 % IJ SOLN
INTRAMUSCULAR | Status: AC
  Filled 2021-12-09: qty 5

## 2021-12-09 MED ORDER — MIDAZOLAM HCL 2 MG/2ML IJ SOLN
INTRAMUSCULAR | Status: AC
  Filled 2021-12-09: qty 2

## 2021-12-09 MED ORDER — CEFAZOLIN SODIUM-DEXTROSE 2-4 GM/100ML-% IV SOLN
2.0000 g | INTRAVENOUS | Status: DC
  Filled 2021-12-09: qty 100

## 2021-12-09 MED ORDER — FENTANYL CITRATE (PF) 100 MCG/2ML IJ SOLN
INTRAMUSCULAR | Status: AC
  Filled 2021-12-09: qty 4

## 2021-12-09 MED ORDER — ATORVASTATIN CALCIUM 10 MG PO TABS
10.0000 mg | ORAL_TABLET | Freq: Every day | ORAL | Status: DC
  Administered 2021-12-09: 10 mg via ORAL
  Filled 2021-12-09: qty 1

## 2021-12-09 MED ORDER — PROPOFOL 10 MG/ML IV BOLUS
INTRAVENOUS | Status: AC
  Filled 2021-12-09: qty 20

## 2021-12-09 MED ORDER — FENTANYL CITRATE (PF) 100 MCG/2ML IJ SOLN
INTRAMUSCULAR | Status: AC | PRN
  Administered 2021-12-09: 50 ug via INTRAVENOUS

## 2021-12-09 MED ORDER — PROPOFOL 1000 MG/100ML IV EMUL
INTRAVENOUS | Status: AC
  Filled 2021-12-09: qty 100

## 2021-12-09 MED ORDER — IOHEXOL 300 MG/ML  SOLN
INTRAMUSCULAR | Status: DC | PRN
  Administered 2021-12-09: 20 mL

## 2021-12-09 MED ORDER — FENTANYL CITRATE PF 50 MCG/ML IJ SOSY
25.0000 ug | PREFILLED_SYRINGE | INTRAMUSCULAR | Status: DC | PRN
  Administered 2021-12-09 (×2): 25 ug via INTRAVENOUS
  Administered 2021-12-09: 50 ug via INTRAVENOUS

## 2021-12-09 MED ORDER — PROPOFOL 10 MG/ML IV BOLUS
INTRAVENOUS | Status: DC | PRN
  Administered 2021-12-09: 130 mg via INTRAVENOUS
  Administered 2021-12-09: 30 mg via INTRAVENOUS

## 2021-12-09 MED ORDER — ONDANSETRON HCL 4 MG/2ML IJ SOLN: 4.0000 mg | INTRAMUSCULAR | Status: DC | PRN

## 2021-12-09 MED ORDER — LIDOCAINE HCL (PF) 1 % IJ SOLN
INTRAMUSCULAR | Status: AC | PRN
  Administered 2021-12-09: 10 mL via INTRADERMAL

## 2021-12-09 MED ORDER — SODIUM CHLORIDE 0.9 % IV SOLN: INTRAVENOUS | Status: DC

## 2021-12-09 MED ORDER — IOHEXOL 300 MG/ML  SOLN
50.0000 mL | Freq: Once | INTRAMUSCULAR | Status: AC | PRN
  Administered 2021-12-09: 15 mL

## 2021-12-09 MED ORDER — LEVOFLOXACIN IN D5W 500 MG/100ML IV SOLN
500.0000 mg | Freq: Once | INTRAVENOUS | Status: AC
  Administered 2021-12-09: 500 mg via INTRAVENOUS
  Filled 2021-12-09: qty 100

## 2021-12-09 MED ORDER — LEVOFLOXACIN IN D5W 500 MG/100ML IV SOLN: 500.0000 mg | Freq: Once | INTRAVENOUS | Status: DC

## 2021-12-09 MED ORDER — LEVOTHYROXINE SODIUM 75 MCG PO TABS
75.0000 ug | ORAL_TABLET | Freq: Every day | ORAL | Status: DC
  Administered 2021-12-10: 75 ug via ORAL
  Filled 2021-12-09: qty 1

## 2021-12-09 MED ORDER — LACTATED RINGERS IV SOLN: INTRAVENOUS | Status: DC

## 2021-12-09 MED ORDER — ACETAMINOPHEN 500 MG PO TABS: 1000.0000 mg | ORAL_TABLET | Freq: Once | ORAL | Status: DC

## 2021-12-09 MED ORDER — ZOLPIDEM TARTRATE 5 MG PO TABS: 5.0000 mg | ORAL_TABLET | Freq: Every evening | ORAL | Status: DC | PRN

## 2021-12-09 MED ORDER — ONDANSETRON HCL 4 MG/2ML IJ SOLN
INTRAMUSCULAR | Status: AC
  Filled 2021-12-09: qty 2

## 2021-12-09 MED ORDER — ORAL CARE MOUTH RINSE: 15.0000 mL | Freq: Once | OROMUCOSAL | Status: AC

## 2021-12-09 MED ORDER — ROCURONIUM BROMIDE 10 MG/ML (PF) SYRINGE
PREFILLED_SYRINGE | INTRAVENOUS | Status: DC | PRN
  Administered 2021-12-09: 20 mg via INTRAVENOUS
  Administered 2021-12-09: 60 mg via INTRAVENOUS

## 2021-12-09 MED ORDER — SULFAMETHOXAZOLE-TRIMETHOPRIM 800-160 MG PO TABS
1.0000 | ORAL_TABLET | Freq: Two times a day (BID) | ORAL | Status: DC
  Administered 2021-12-09: 1 via ORAL
  Filled 2021-12-09: qty 1

## 2021-12-09 MED ORDER — MIDAZOLAM HCL 2 MG/2ML IJ SOLN
INTRAMUSCULAR | Status: DC | PRN
  Administered 2021-12-09: 2 mg via INTRAVENOUS

## 2021-12-09 MED ORDER — AMISULPRIDE (ANTIEMETIC) 5 MG/2ML IV SOLN: 10.0000 mg | Freq: Once | INTRAVENOUS | Status: DC | PRN

## 2021-12-09 MED ORDER — MIDAZOLAM HCL 2 MG/2ML IJ SOLN
INTRAMUSCULAR | Status: AC
  Filled 2021-12-09: qty 4

## 2021-12-09 MED ORDER — SENNA 8.6 MG PO TABS
1.0000 | ORAL_TABLET | Freq: Two times a day (BID) | ORAL | Status: DC
  Filled 2021-12-09: qty 1

## 2021-12-09 MED ORDER — METOPROLOL TARTRATE 25 MG PO TABS
12.5000 mg | ORAL_TABLET | Freq: Two times a day (BID) | ORAL | Status: DC
  Administered 2021-12-09: 12.5 mg via ORAL
  Filled 2021-12-09: qty 1

## 2021-12-09 MED ORDER — ACETAMINOPHEN 10 MG/ML IV SOLN: 1000.0000 mg | Freq: Once | INTRAVENOUS | Status: DC | PRN

## 2021-12-09 MED ORDER — TADALAFIL 20 MG PO TABS
ORAL_TABLET | ORAL | 99 refills | Status: DC
  Filled 2021-12-09: qty 30, 30d supply, fill #0

## 2021-12-09 MED ORDER — ONDANSETRON HCL 4 MG/2ML IJ SOLN
INTRAMUSCULAR | Status: DC | PRN
  Administered 2021-12-09: 4 mg via INTRAVENOUS

## 2021-12-09 MED ORDER — LIDOCAINE HCL 1 % IJ SOLN
INTRAMUSCULAR | Status: AC
  Filled 2021-12-09: qty 20

## 2021-12-09 MED ORDER — SUGAMMADEX SODIUM 200 MG/2ML IV SOLN
INTRAVENOUS | Status: DC | PRN
  Administered 2021-12-09: 150 mg via INTRAVENOUS

## 2021-12-09 MED ORDER — LEVOFLOXACIN IN D5W 500 MG/100ML IV SOLN
500.0000 mg | Freq: Once | INTRAVENOUS | Status: DC
Start: 2021-12-09 — End: 2021-12-09
  Filled 2021-12-09: qty 100

## 2021-12-09 MED ORDER — CHLORHEXIDINE GLUCONATE 0.12 % MT SOLN
15.0000 mL | Freq: Once | OROMUCOSAL | Status: AC
  Administered 2021-12-09: 15 mL via OROMUCOSAL

## 2021-12-09 MED ORDER — FENTANYL CITRATE (PF) 250 MCG/5ML IJ SOLN
INTRAMUSCULAR | Status: DC | PRN
  Administered 2021-12-09: 100 ug via INTRAVENOUS

## 2021-12-09 MED ORDER — BUPROPION HCL ER (XL) 300 MG PO TB24: 300.0000 mg | ORAL_TABLET | Freq: Every day | ORAL | Status: DC

## 2021-12-09 MED ORDER — OXYCODONE HCL 5 MG PO TABS
5.0000 mg | ORAL_TABLET | ORAL | Status: DC | PRN
  Administered 2021-12-09 – 2021-12-10 (×3): 5 mg via ORAL
  Filled 2021-12-09 (×3): qty 1

## 2021-12-09 MED ORDER — LIDOCAINE 2% (20 MG/ML) 5 ML SYRINGE
INTRAMUSCULAR | Status: DC | PRN
  Administered 2021-12-09: 100 mg via INTRAVENOUS

## 2021-12-09 SURGICAL SUPPLY — 43 items
BAG COUNTER SPONGE SURGICOUNT (BAG) IMPLANT
BAG SPNG CNTER NS LX DISP (BAG)
BAG URINE DRAIN 2000ML AR STRL (UROLOGICAL SUPPLIES) IMPLANT
BASKET ZERO TIP NITINOL 2.4FR (BASKET) ×2 IMPLANT
BENZOIN TINCTURE PRP APPL 2/3 (GAUZE/BANDAGES/DRESSINGS) ×4 IMPLANT
BLADE SURG 15 STRL LF DISP TIS (BLADE) ×1 IMPLANT
BLADE SURG 15 STRL SS (BLADE) ×2
CATH FOLEY 2W COUNCIL 20FR 5CC (CATHETERS) IMPLANT
CATH FOLEY 2W COUNCIL 5CC 16FR (CATHETERS) ×1 IMPLANT
CATH ROBINSON RED A/P 20FR (CATHETERS) IMPLANT
CATH X-FORCE N30 NEPHROSTOMY (TUBING) ×2 IMPLANT
CHLORAPREP W/TINT 26 (MISCELLANEOUS) ×2 IMPLANT
COVER SURGICAL LIGHT HANDLE (MISCELLANEOUS) ×1 IMPLANT
DRAPE C-ARM 42X120 X-RAY (DRAPES) ×2 IMPLANT
DRAPE LINGEMAN PERC (DRAPES) ×2 IMPLANT
DRAPE SURG IRRIG POUCH 19X23 (DRAPES) ×2 IMPLANT
DRSG PAD ABDOMINAL 8X10 ST (GAUZE/BANDAGES/DRESSINGS) ×1 IMPLANT
DRSG TEGADERM 8X12 (GAUZE/BANDAGES/DRESSINGS) ×3 IMPLANT
EXTRACTOR STONE 1.7FRX115CM (UROLOGICAL SUPPLIES) ×1 IMPLANT
GAUZE SPONGE 4X4 12PLY STRL (GAUZE/BANDAGES/DRESSINGS) ×1 IMPLANT
GLOVE SURG ENC TEXT LTX SZ8 (GLOVE) ×2 IMPLANT
GOWN STRL REUS W/TWL XL LVL3 (GOWN DISPOSABLE) ×4 IMPLANT
GUIDEWIRE AMPLAZ .035X145 (WIRE) ×4 IMPLANT
KIT BASIN OR (CUSTOM PROCEDURE TRAY) ×2 IMPLANT
KIT PROBE 340X3.4XDISP GRN (MISCELLANEOUS) IMPLANT
KIT PROBE TRILOGY 3.4X340 (MISCELLANEOUS)
KIT PROBE TRILOGY 3.9X350 (MISCELLANEOUS) ×1 IMPLANT
KIT TURNOVER KIT A (KITS) IMPLANT
LASER FIB FLEXIVA PULSE ID 365 (Laser) IMPLANT
MANIFOLD NEPTUNE II (INSTRUMENTS) ×2 IMPLANT
PACK CYSTO (CUSTOM PROCEDURE TRAY) ×2 IMPLANT
SHEATH PEELAWAY SET 9 (SHEATH) ×2 IMPLANT
SPONGE T-LAP 4X18 ~~LOC~~+RFID (SPONGE) ×2 IMPLANT
SUT SILK 2 0 30  PSL (SUTURE) ×2
SUT SILK 2 0 30 PSL (SUTURE) ×1 IMPLANT
SYR 10ML LL (SYRINGE) ×2 IMPLANT
SYR 20ML LL LF (SYRINGE) ×4 IMPLANT
TRACTIP FLEXIVA PULS ID 200XHI (Laser) IMPLANT
TRACTIP FLEXIVA PULSE ID 200 (Laser)
TRAY FOLEY MTR SLVR 16FR STAT (SET/KITS/TRAYS/PACK) ×2 IMPLANT
TUBING CONNECTING 10 (TUBING) ×4 IMPLANT
TUBING STONE CATCHER TRILOGY (MISCELLANEOUS) ×1 IMPLANT
TUBING UROLOGY SET (TUBING) ×2 IMPLANT

## 2021-12-09 NOTE — Op Note (Signed)
Preoperative diagnosis: Left renal calculi, largest diameter 18 mm  Postoperative diagnosis: Same  Principal procedure: Left percutaneous nephrolithotomy 18 mm stone, placement of left double-J stent, 6 French by 26 cm, antegrade nephrostogram, left, fluoroscopic interpretation  Surgeon: Rhonda Linan  Anesthesia: General endotracheal  Complications: None  Specimen: Stone fragments  Estimated blood loss: 250 mL  Drains: 6 x 26 cm contour double-J stent on left without tether, 66 French council tip catheter as left nephrostomy tube  Indications: 76 year old female with recurrent urolithiasis-symptomatic left renal calculi.  In the fall 2022 the largest of these 3 to 4 stones was measured at 18 mm.  The patient has held off on treatment until now.  She understands the process of percutaneous nephrolithotomy, having been through this before.  I preoperatively discussed the risks and complications including but not limited to bleeding, need for transfusion, infection, need for second procedure, placement of a double-J stent, anesthetic complications, among others.  She understands these and desires to proceed.  Findings: Relatively small/narrow left renal pelvis.  Calyces were also quite narrow.  Cluster of stones found in the pelvis.  No lesions within the pyelocalyceal system.  Description of procedure: The patient underwent placement of left percutaneous nephrostomy tube in the interventional radiology suite by Dr. Markus Daft.  She was then taken to the preoperative area.  She was identified, marked, and had received preoperative IV antibiotics in the interventional suite.  She was then taken to the operating room where general anesthetic was administered.  Endotracheal apparatus utilized.  While in the supine position urethral catheter was placed.  She was then transferred to the prone position on the operating table.  All pressure points and extremities were padded appropriately.  Her left flank  was prepped and draped around the indwelling nephrostomy catheter.  Timeout was then called.  A Super Stiff Bentson guidewire was advanced through the Kumpe catheter which had been utilized as the nephrostomy tube.  This was guided into the bladder using fluoroscopy.  Once in position, the Kumpe catheter was removed over top of the working guidewire.  Safety wire was placed utilizing peel-away sheath.  Following this, the NephroMax balloon was advanced over the working guidewire, using fluoroscopy to advance it up to the area of the renal pelvic calculi.  Balloon inflated to 20 atm of pressure for 2 minutes.  The nephrostomy access sheath was then worked down over top of the balloon to the area of the stones.  Balloon deflated, removed.  Rigid nephroscope was then passed into the renal pelvis.  Small clots were picked out with graspers.  The large stone was easily identified.  The trilogy lithotrite was advanced up to the stone.  Using the harmonic settings, the stone was fragmented and aspirated.  Smaller fragments were removed with the graspers.  No small fragments were remaining.  The other stones present, approximately 6, were easily picked out using the grasper without lithotripsy.  I then remove the rigid nephroscope.  This was replaced with a flexible nephroscope.  Renal pelvis and readily accessible calyces were scoped.  No further stone matter was seen except for 1 remaining fragment which was grasped and extracted with the 0 tip basket.  Reinspection with the flexible scope and fluoroscopy revealed no evident stone/calcifications.  At this point the scope was removed.  I then passed the rigid nephroscope over top of the working guidewire.  6 [place] by 26 cm contour double-J stent was negotiated with the distal tip in the bladder using fluoroscopic  guidance.  Once adequately positioned, the guidewire was removed and the stent was deployed with a good curl seen in the bladder and then the more proximal  curl seen in the renal pelvis using fluoroscopy and endoscopy, respectively.  At this point the scope and the access catheter were removed.  I then passed a 2 [place] council tip catheter over top of the safety guidewire.  It was positioned in the renal pelvis.  I then utilized 10 cc of Omnipaque to provide an antegrade nephrostogram.  No extravasation was seen in the calyceal or pelvic system.  There was egressed down into the ureter.  At this point the balloon was filled with approximately 3 cc of the contrast agent.  Once adequately positioned, I then sutured the council tip catheter to the skin using 2-0 silk sutures.  The wound was also closed with the same silk.  Dry sterile dressing was placed.  Once adequately dressed, the patient was then transferred to the supine position.  She was extubated, awakened, and taken to the PACU in stable condition.  Estimated blood loss was approximately 250 cc.  Sponge, needle, instrument counts were correct x2.

## 2021-12-09 NOTE — Procedures (Signed)
Interventional Radiology Procedure:   Indications: Left renal calculi and scheduled for percutaneous nephrolithotomy, needs percutaneous access  Procedure: Placement of left nephroureteral catheter  Findings: Bifid collecting system with multiple stones in renal pelvis.  Calyx in mid/upper pole that drains into lower moiety was accessed and catheter was navigated thru the stones and into the left ureter and bladder.    Complications: No immediate complications noted.     EBL: Minimal  Plan: To OR for nephrolithotomy procedure.    Joanthan Hlavacek R. Anselm Pancoast, MD  Pager: [phone]

## 2021-12-09 NOTE — Anesthesia Procedure Notes (Signed)
Procedure Name: Intubation Date/Time: 12/09/2021 12:37 PM Performed by: Lollie Sails, CRNA Pre-anesthesia Checklist: Patient identified, Emergency Drugs available, Suction available, Patient being monitored and Timeout performed Patient Re-evaluated:Patient Re-evaluated prior to induction Oxygen Delivery Method: Circle system utilized Preoxygenation: Pre-oxygenation with 100% oxygen Induction Type: IV induction Ventilation: Mask ventilation without difficulty Laryngoscope Size: Miller and 3 Grade View: Grade III Tube type: Oral Tube size: 7.0 mm Number of attempts: 1 Airway Equipment and Method: Stylet and Bougie stylet Placement Confirmation: ETT inserted through vocal cords under direct vision, positive ETCO2 and breath sounds checked- equal and bilateral Secured at: 21 cm Tube secured with: Tape Dental Injury: Teeth and Oropharynx as per pre-operative assessment  Difficulty Due To: Difficulty was anticipated and Difficult Airway- due to anterior larynx Comments: Able to visualize base of laryngeal opening with external pressure.   Bougie passed smoothly with endotube over it.

## 2021-12-09 NOTE — Anesthesia Postprocedure Evaluation (Signed)
Anesthesia Post Note  Patient: [name]  Procedure(s) Performed: NEPHROLITHOTOMY PERCUTANEOUS (Left)     Patient location during evaluation: PACU Anesthesia Type: General Level of consciousness: sedated Pain management: pain level controlled Vital Signs Assessment: post-procedure vital signs reviewed and stable Respiratory status: spontaneous breathing and respiratory function stable Cardiovascular status: stable Postop Assessment: no apparent nausea or vomiting Anesthetic complications: no   No notable events documented.  Last Vitals:  Vitals:   12/09/21 1500 12/09/21 1515  BP: 131/88 140/82  Pulse: (!) 43 (!) 44  Resp: 15 16  Temp:  (!) 36.3 C  SpO2: 100% 100%    Last Pain:  Vitals:   12/09/21 1520  TempSrc:   PainSc: 5                  Keeva Reisen DANIEL

## 2021-12-09 NOTE — OR Nursing (Signed)
Stone taken by Dr. Dahlstedt. 

## 2021-12-09 NOTE — Interval H&P Note (Signed)
History and Physical Interval Note:  12/09/2021 12:28 PM  [name]  has presented today for surgery, with the diagnosis of LEFT RENAL CALCULI.  The various methods of treatment have been discussed with the patient and family. After consideration of risks, benefits and other options for treatment, the patient has consented to  Procedure(s) with comments: NEPHROLITHOTOMY PERCUTANEOUS (Left) - 2 HRS HOLMIUM LASER APPLICATION (Left) as a surgical intervention.  The patient's history has been reviewed, patient examined, no change in status, stable for surgery.  I have reviewed the patient's chart and labs.  Questions were answered to the patient's satisfaction.     Lillette Boxer Timmya Blazier

## 2021-12-09 NOTE — Discharge Instructions (Signed)
DISCHARGE INSTRUCTIONS FOR PERCUTANEOUS STONE SURGERY MEDICATIONS:  1. DO NOT RESUME YOUR IBUPROFEN, or any other medicines like aspirin, motrin, excedrin, advil, aleve, vitamin E, fish oil as these can all cause bleeding x 10 days.  2. Resume all your other meds from home - except do not take any other pain meds that you may have at home.  ACTIVITY 1. No strenuous activity, sexual activity, or lifting greater than 10 pounds for 2 weeks. 2. No driving while on narcotic pain medications 3. Drink plenty of water 4. Continue to walk at home - you can still get blood clots when you are at home, so keep active, but don't over do it. 5. May return to work in 1 week (but not heavy or strenuous activity).   BATHING You can shower.  Cover your wound with a dressing and remove the dressing immediately after the shower.  Do not submerge wound under water.   WOUND CARE Your wound will drain bloody fluid and may do so for 7-14 days. You have 2 options for dressings:  1. You may use gauze and tape to dress your wound.  If you choose this method, then change the dressing as it becomes soaked.  Change it at least once daily until it stops draining. You may switch to a Band Aid once drainage stops. 2. If drainage is copious, you may use an ostomy device.  This is a bag with an andhesive circle.  The circle has a hole in the middle of it and you cut the hole to the size needed to fit the wound.  This will collect the drainage in the bag and allow you to drain the bag as needed.   SIGNS/SYMPTOMS TO CALL: Please call us if you have a fever greater than 101.5, uncontrolled nausea/vomiting, uncontrolled pain, dizziness, unable to urinate, bloody urine, chest pain, shortness of breath, leg swelling, leg pain, redness around wound, drainage from wound, or any other concerns or questions. You can reach us at [phone].   

## 2021-12-09 NOTE — H&P (Signed)
Referring Physician(s): [place]  Supervising Physician: Markus Daft  Patient Status:  [facility]  Chief Complaint:  Left renal calculi  Subjective: Patient known to IR service from left percutaneous nephrostomy in 2020.  She has a past medical history significant for right breast cancer in 2014, diverticulosis, Graves' disease, hyperlipidemia, hypertension, IBS, depression, mitral valve prolapse, PE in 2019, nephrolithiasis. She underwent previous left percutaneous nephrolithotomy in September 2020 but unfortunately now she has had significant recurrence and regrowth of the stones.  She presents again today for left percutaneous nephrostomy/nephroureteral catheter placement prior to nephrolithotomy.  Denies fever, headache, chest pain, dyspnea, cough, abdominal/back pain, nausea, vomiting or bleeding.  Past Medical History:  Diagnosis Date   Allergic rhinitis    Arthritis    "knees" (08/28/2018)   Breast cancer, right breast ([place]) 05/20/2013   "no chemo" (08/28/2018)   Chronic lower back pain    Diverticulosis    DOE (dyspnea on exertion)    Dysrhythmia    Graves' disease    2011   Heart palpitations    History of kidney stones    HLD (hyperlipidemia)    Hx of radiation therapy 08/13/13- 09/06/13   right breast 4250 cGy 17 sessions   Hypertension    IBS (irritable bowel syndrome)    Lumbar herniated disc    L4-L5   Major depression    MVP (mitral valve prolapse)    Osteopenia    Personal history of radiation therapy    Pneumonia 08/2019   PONV (postoperative nausea and vomiting)    "when I have general anesthetic" (08/28/2018)   Pulmonary emboli ([facility])    after knee replacement   PVC (premature ventricular contraction)    Takes metoprolol PRN for  pvc's   Tubular adenoma of colon 03/07/2012   Past Surgical History:  Procedure Laterality Date   BREAST BIOPSY Right 2014   BREAST EXCISIONAL BIOPSY Right    BREAST LUMPECTOMY WITH NEEDLE LOCALIZATION AND  AXILLARY SENTINEL LYMPH NODE BX Right 06/11/2013   Procedure: BREAST LUMPECTOMY WITH NEEDLE LOCALIZATION AND AXILLARY SENTINEL LYMPH NODE BX;  Surgeon: Harl Bowie, MD;  Location: [facility];  Service: General;  Laterality: Right;  Needle localization BCG 7:30   COLONOSCOPY W/ BIOPSIES AND POLYPECTOMY  multiple   CYSTOSCOPY WITH STENT PLACEMENT Left 08/15/2019   Procedure: CYSTOSCOPY, left retrograde pyleogram  WITH STENT EXCHANGE left and foley placement;  Surgeon: Franchot Gallo, MD;  Location: [facility];  Service: Urology;  Laterality: Left;   IR URETERAL STENT LEFT NEW ACCESS W/O SEP NEPHROSTOMY CATH  08/12/2019   KNEE ARTHROSCOPY Left 2006   NEPHROLITHOTOMY Left 08/12/2019   Procedure: NEPHROLITHOTOMY PERCUTANEOUS/INSERTION DOUBLE J STENT;  Surgeon: Franchot Gallo, MD;  Location: [facility];  Service: Urology;  Laterality: Left;  2 HRS   TOTAL KNEE ARTHROPLASTY Right 08/28/2018   Procedure: RIGHT TOTAL KNEE ARTHROPLASTY;  Surgeon: Garald Balding, MD;  Location: [facility];  Service: Orthopedics;  Laterality: Right;      Allergies: Dilaudid [hydromorphone hcl], Terak [terramycin], and Amoxicillin  Medications: Prior to Admission medications   Medication Sig Start Date End Date Taking? Authorizing Provider  atorvastatin (LIPITOR) 10 MG tablet Take 10 mg by mouth at bedtime.     [provider]  buPROPion (WELLBUTRIN XL) 300 MG 24 hr tablet 1 tablet in the morning Orally Once a day 05/05/21   Fayrene Helper, MD  Calcium Carb-Cholecalciferol (CALCIUM 600 + D PO) Take 1 tablet by mouth at bedtime.  [provider]  ECHINACEA PO Take 1,300 mg by mouth daily as needed (immune support).    [provider]  fluticasone (FLONASE) 50 MCG/ACT nasal spray Place 2 sprays into the nose daily.     [provider]  hyoscyamine (LEVSIN SL) 0.125 MG SL tablet Take 1 tablet by mouth every 6-8 hours as needed for abdominal pain Patient taking differently: Take 0.125  mg by mouth every 6 (six) hours as needed (heartburn). 08/27/20   Noralyn Pick, NP  ibuprofen (ADVIL) 200 MG tablet Take 200-400 mg by mouth every 8 (eight) hours as needed (pain.).    [provider]  metoprolol tartrate (LOPRESSOR) 25 MG tablet Take 0.5 tablets (12.5 mg total) by mouth 2 (two) times daily. 01/28/20   Nicholas Lose, MD  Multiple Vitamin (MULTIVITAMIN WITH MINERALS) TABS tablet Take 1 tablet by mouth daily.    [provider]  polyethylene glycol (MIRALAX / GLYCOLAX) 17 g packet Take 17 g by mouth daily as needed for moderate constipation.    [provider]  Probiotic Product (PROBIOTIC DAILY PO) Take 1 capsule by mouth daily.    [provider]  senna (SENOKOT) 8.6 MG tablet Take 1 tablet by mouth daily as needed for constipation.    [provider]  Sod Fluoride-Potassium Nitrate (PREVIDENT 5000 ENAMEL PROTECT) 1.1-5 % GEL Place 1 application onto teeth daily.    [provider]  SYNTHROID 75 MCG tablet TAKE ONE TABLET EVERY MORNING ON AN EMPTY STOMACH 09/01/21   Fayrene Helper, MD  Turmeric (QC TUMERIC COMPLEX PO) Take 1,500 mg by mouth daily.    [provider]  zolpidem (AMBIEN) 10 MG tablet Take 2.5 mg by mouth at bedtime as needed for sleep.  03/22/19   [provider]     Vital Signs: Blood pressure 136/80, heart rate 44, temp 98.2, respirations 16, O2 sat 99% room air   Physical Exam awake, alert.  Chest clear to auscultation bilaterally.  Heart with bradycardic rate but regular rhythm.  Abdomen soft, positive bowel sounds, nontender.  No lower extremity edema.  Imaging: No results found.  Labs:  CBC: Recent Labs    03/11/21 1547 12/06/21 0827  WBC 4.7 3.5*  HGB 12.8 14.0  HCT 38.6 43.2  PLT 240.0 214    COAGS: No results for input(s): INR, APTT in the last 8760 hours.  BMP: Recent Labs    03/11/21 1547 12/06/21 0827  NA 141 137  K 4.2 4.2  CL 105 105  CO2 30 27   GLUCOSE 85 77  BUN 17 18  CALCIUM 9.4 8.8*  CREATININE 0.95 0.88  GFRNONAA  --  >60    LIVER FUNCTION TESTS: Recent Labs    03/11/21 1547  BILITOT 0.4  AST 17  ALT 13  ALKPHOS 71  PROT 6.7  ALBUMIN 4.0    Assessment and Plan: Patient known to IR service from left percutaneous nephrostomy in 2020.  She has a past medical history significant for right breast cancer in 2014, diverticulosis, Graves' disease, hyperlipidemia, hypertension, IBS, depression, mitral valve prolapse, PE in 2019, nephrolithiasis. She underwent previous left percutaneous nephrolithotomy in September 2020 but unfortunately now she has had significant recurrence and regrowth of the stones.  She presents again today for left percutaneous nephrostomy/nephroureteral catheter placement prior to nephrolithotomy. Risks and benefits of left  PCN placement was discussed with the patient including, but not limited to, infection, bleeding, significant bleeding causing loss or decrease in renal  function or damage to adjacent structures.   All of the patient's questions were answered, patient is agreeable to proceed.  Consent signed and in chart.      Electronically Signed: D. Rowe Robert, PA-C 12/09/2021, 8:52 AM   I spent a total of 25 Minutes at the the patient's bedside AND on the patient's hospital floor or unit, greater than 50% of which was counseling/coordinating care for left percutaneous nephrostomy/nephroureteral catheter placement

## 2021-12-09 NOTE — Transfer of Care (Signed)
Immediate Anesthesia Transfer of Care Note  Patient: [name]  Procedure(s) Performed: NEPHROLITHOTOMY PERCUTANEOUS (Left)  Patient Location: PACU  Anesthesia Type:General  Level of Consciousness: awake, drowsy and responds to stimulation  Airway & Oxygen Therapy: Patient Spontanous Breathing and Patient connected to face mask oxygen  Post-op Assessment: Report given to RN and Post -op Vital signs reviewed and stable  Post vital signs: Reviewed and stable  Last Vitals:  Vitals Value Taken Time  BP 140/75 12/09/21 1415  Temp    Pulse 50 12/09/21 1416  Resp 14 12/09/21 1416  SpO2 100 % 12/09/21 1416  Vitals shown include unvalidated device data.  Last Pain:  Vitals:   12/09/21 0823  TempSrc: Oral  PainSc: 0-No pain      Patients Stated Pain Goal: 3 (97/74/14 2395)  Complications: No notable events documented.

## 2021-12-09 NOTE — Anesthesia Preprocedure Evaluation (Addendum)
Anesthesia Evaluation  Patient identified by MRN, date of birth, ID band Patient awake    Reviewed: Allergy & Precautions, NPO status , Patient's Chart, lab work & pertinent test results  History of Anesthesia Complications (+) PONV  Airway Mallampati: II  TM Distance: >3 FB Neck ROM: Full    Dental no notable dental hx.    Pulmonary PE (post knee replacement)   Pulmonary exam normal        Cardiovascular hypertension, Pt. on medications and Pt. on home beta blockers +CHF and + DOE  + Valvular Problems/Murmurs MVP  Rhythm:Regular Rate:Normal  PVCs   Neuro/Psych Depression negative neurological ROS     GI/Hepatic negative GI ROS, Neg liver ROS,   Endo/Other  Hypothyroidism   Renal/GU Renal disease  negative genitourinary   Musculoskeletal  (+) Arthritis ,   Abdominal Normal abdominal exam  (+)   Peds  Hematology  (+) anemia ,   Anesthesia Other Findings   Reproductive/Obstetrics                           Anesthesia Physical Anesthesia Plan  ASA: 3  Anesthesia Plan: General   Post-op Pain Management:    Induction: Intravenous  PONV Risk Score and Plan: 4 or greater and Ondansetron, Dexamethasone, Treatment may vary due to age or medical condition, TIVA and Propofol infusion  Airway Management Planned: Mask and LMA  Additional Equipment: None  Intra-op Plan:   Post-operative Plan: Extubation in OR  Informed Consent: I have reviewed the patients History and Physical, chart, labs and discussed the procedure including the risks, benefits and alternatives for the proposed anesthesia with the patient or authorized representative who has indicated his/her understanding and acceptance.     Dental advisory given  Plan Discussed with: CRNA  Anesthesia Plan Comments: (Lab Results      Component                Value               Date                      WBC                       3.5 (L)             12/06/2021                HGB                      14.0                12/06/2021                HCT                      43.2                12/06/2021                MCV                      102.6 (H)           12/06/2021                PLT  214                 12/06/2021           Lab Results      Component                Value               Date                      NA                       137                 12/06/2021                K                        4.2                 12/06/2021                CO2                      27                  12/06/2021                GLUCOSE                  77                  12/06/2021                BUN                      18                  12/06/2021                CREATININE               0.88                12/06/2021                CALCIUM                  8.8 (L)             12/06/2021                GFRNONAA                 >60                 12/06/2021            ECHO 11/22: 1. Left ventricular ejection fraction, by estimation, is 60 to 65%. The  left ventricle has normal function. The left ventricle has no regional  wall motion abnormalities. Left ventricular diastolic parameters were  normal.  2. Right ventricular systolic function is normal. The right ventricular  size is normal. There is normal pulmonary artery systolic pressure. The  estimated right ventricular systolic pressure is 25.3 mmHg.  3. The mitral valve is normal in structure. Mild mitral valve  regurgitation. No evidence of mitral stenosis.  4. The aortic valve is  tricuspid. Aortic valve regurgitation is trivial.  No aortic stenosis is present.  5. Pulmonic valve regurgitation is moderate.  6. The inferior vena cava is normal in size with greater than 50%  respiratory variability, suggesting right atrial pressure of 3 mmHg. )     Anesthesia Quick Evaluation

## 2021-12-10 ENCOUNTER — Encounter (HOSPITAL_COMMUNITY): Payer: Self-pay | Admitting: Urology

## 2021-12-10 DIAGNOSIS — N200 Calculus of kidney: Secondary | ICD-10-CM | POA: Diagnosis not present

## 2021-12-10 LAB — HEMOGLOBIN AND HEMATOCRIT, BLOOD
HCT: 39.7 % (ref 36.0–46.0)
Hemoglobin: 13 g/dL (ref 12.0–15.0)

## 2021-12-10 NOTE — Progress Notes (Signed)
Pt's foley catheter discontinued and nephrostomy tube capped. Pt in NAD. Will continue to monitor pt.

## 2021-12-10 NOTE — Progress Notes (Signed)
Discharge AVS reviewed with patient. Pt requested clarification on the new rx. Awaiting call back from Dr. Diona Fanti. Coolidge Breeze, RN 12/10/2021

## 2021-12-14 NOTE — Discharge Summary (Signed)
Patient ID: [name] MRN: [id number] DOB/AGE: June 06, 1946 76 y.o.  Admit date: 12/09/2021 Discharge date: 12/14/2021  Primary Care Physician:  Ollen Bowl, MD  Discharge Diagnoses: Left renal calculi Present on Admission: **None**     Discharge Medications: Allergies as of 12/10/2021       Reactions   Dilaudid [hydromorphone Hcl] Nausea And Vomiting   Terak [terramycin] Rash   Amoxicillin Diarrhea   Has patient had a amoxicillin reaction causing immediate rash, facial/tongue/throat swelling, SOB or lightheadedness with hypotension: No Has patient had a PCN reaction causing severe rash involving mucus membranes or skin necrosis: No Has patient had a PCN reaction that required hospitalization: No Has patient had a PCN reaction occurring within the last 10 years: Unknown If all of the above answers are "NO", then may proceed with Cephalosporin use.        Medication List     TAKE these medications    atorvastatin 10 MG tablet Commonly known as: LIPITOR Take 10 mg by mouth at bedtime.   buPROPion 300 MG 24 hr tablet Commonly known as: WELLBUTRIN XL 1 tablet in the morning Orally Once a day   CALCIUM 600 + D PO Take 1 tablet by mouth at bedtime.   ECHINACEA PO Take 1,300 mg by mouth daily as needed (immune support).   fluticasone 50 MCG/ACT nasal spray Commonly known as: FLONASE Place 2 sprays into the nose daily.   hyoscyamine 0.125 MG SL tablet Commonly known as: LEVSIN SL Take 1 tablet by mouth every 6-8 hours as needed for abdominal pain What changed:  how much to take how to take this when to take this reasons to take this additional instructions   ibuprofen 200 MG tablet Commonly known as: ADVIL Take 200-400 mg by mouth every 8 (eight) hours as needed (pain.).   metoprolol tartrate 25 MG tablet Commonly known as: LOPRESSOR Take 0.5 tablets (12.5 mg total) by mouth 2 (two) times daily.   multivitamin with minerals Tabs tablet Take  1 tablet by mouth daily.   polyethylene glycol 17 g packet Commonly known as: MIRALAX / GLYCOLAX Take 17 g by mouth daily as needed for moderate constipation.   PreviDent 5000 Enamel Protect 1.1-5 % Gel Generic drug: Sod Fluoride-Potassium Nitrate Place 1 application onto teeth daily.   PROBIOTIC DAILY PO Take 1 capsule by mouth daily.   QC TUMERIC COMPLEX PO Take 1,500 mg by mouth daily.   senna 8.6 MG tablet Commonly known as: SENOKOT Take 1 tablet by mouth daily as needed for constipation.   Synthroid 75 MCG tablet Generic drug: levothyroxine TAKE ONE TABLET EVERY MORNING ON AN EMPTY STOMACH   tadalafil 20 MG tablet Commonly known as: Cialis Take 1 tablet by mouth daily as needed   zolpidem 10 MG tablet Commonly known as: AMBIEN Take 2.5 mg by mouth at bedtime as needed for sleep.         Significant Diagnostic Studies:  DG C-Arm 1-60 Min-No Report  Result Date: 12/09/2021 Fluoroscopy was utilized by the requesting physician.  No radiographic interpretation.   IR URETERAL STENT LEFT NEW ACCESS W/O SEP NEPHROSTOMY CATH  Result Date: 12/09/2021 INDICATION: 76 year old with left renal calculi and scheduled for percutaneous nephrolithotomy procedure. Patient needs percutaneous access. EXAM: PLACEMENT OF LEFT NEPHROURETERAL CATHETER USING ULTRASOUND AND FLUOROSCOPIC GUIDANCE COMPARISON:  None. MEDICATIONS: Levaquin 500 mg; The antibiotic was administered in an appropriate time frame prior to skin puncture. ANESTHESIA/SEDATION: Moderate (conscious) sedation was employed during this procedure. A total  of Versed 4.0mg  and fentanyl 100 mcg was administered intravenously at the order of the provider performing the procedure. Total intra-service moderate sedation time: 37 minutes. Patient's level of consciousness and vital signs were monitored continuously by radiology nurse throughout the procedure under the supervision of the provider performing the procedure. CONTRAST:  15 mL  Omnipaque 300-administered into the collecting system(s) FLUOROSCOPY TIME:  Fluoroscopy Time: 8 minutes, 37 seconds, 28 mGy COMPLICATIONS: None immediate. PROCEDURE: Informed written consent was obtained from the patient after a thorough discussion of the procedural risks, benefits and alternatives. All questions were addressed. Maximal Sterile Barrier Technique was utilized including caps, mask, sterile gowns, sterile gloves, sterile drape, hand hygiene and skin antiseptic. A timeout was performed prior to the initiation of the procedure. Patient was placed prone. The left flank was prepped and draped in sterile fashion. Fluoroscopy demonstrated multiple stones in the left renal pelvis. Ultrasound demonstrated mild left hydronephrosis. Skin was anesthetized with 1% lidocaine. Using ultrasound guidance, a 21 gauge needle was directed into a mildly dilated calyx in the mid right kidney. Contrast injection confirmed placement in the collecting system. Patient has bifid collecting system with the access draining through the upper pole moiety. A dilated calyx in the mid/upper pole of the left kidney draining into the lower moiety was targeted with a 22 gauge needle. 22 gauge needle was directed into the calyx with fluoroscopic guidance and a 0.018 wire was advanced into the calyx. There was an obstruction of the infundibulum due to the stone. Dilator set was placed and injection confirmed placement in the calyx. A J wire was placed and a Kumpe catheter was placed into the calyx. A Roadrunner wire was successfully advanced beyond the obstruction of the infundibulum. Catheter was advanced to the renal pelvis and eventually advanced down the left ureter into the bladder. Catheter was sutured to skin. Catheter was capped and flushed. Dressing was placed. FINDINGS: Multiple stones in left renal pelvis. Patient has a partially bifid renal collecting system. An upper moiety calyx was initially punctured and used to opacify the  renal collecting system. A dilated calyx in the mid/upper pole draining through the lower moiety was targeted. The infundibulum of this calyx was obstructed with stones but a catheter was successfully advanced beyond the obstruction. Five French catheter was advanced into the renal pelvis and down the left ureter. Catheter was eventually placed in the bladder. IMPRESSION: Successful placement of left nephroureteral catheter via mid/upper pole calyx. Electronically Signed   By: Richarda Overlie M.D.   On: 12/09/2021 16:48    Brief H and P: For complete details please refer to admission H and P, but in brief patient admitted for left percutaneous nephrolithotomy for large left renal calculi  Hospital Course: Patient underwent left renal access procedure in interventional radiology.  Subsequently taken to the operating room where left percutaneous nephrolithotomy was performed.  She was left with a double-J stent as well as a nephrostomy tube.  Nephrostomy tube was removed postoperative morning #1.  She voided after catheter removal, had no significant pain on the day of his discharge and was discharged to home in improved condition. Principal Problem:   Calculus of kidney   Day of Discharge BP 125/71 (BP Location: Left Arm)    Pulse (!) 56    Temp 98.4 F (36.9 C) (Oral)    Resp 19    Ht 5\' 4"  (1.626 m)    Wt 61.2 kg    SpO2 98%    BMI 23.17  kg/m   No results found for this or any previous visit (from the past 24 hour(s)).  Physical Exam: General: Alert and awake oriented x3 not in any acute distress. HEENT: anicteric sclera, pupils reactive to light and accommodation CVS: S1-S2 clear no murmur rubs or gallops Chest: clear to auscultation bilaterally, no wheezing rales or rhonchi Abdomen: soft nontender, nondistended, normal bowel sounds, no organomegaly Extremities: no cyanosis, clubbing or edema noted bilaterally Neuro: Cranial nerves II-XII intact, no focal neurological deficits  Disposition:  Home  Diet: Regular  Activity: Gradually increase   TESTS THAT NEED FOLLOW-UP  N/A  DISCHARGE FOLLOW-UP   Follow-up Information     Vanessa Barbara, NP Follow up.   Why: 2.3.2023 @ 3 pm Contact information: [address] [place] [address] [phone]                 Time spent on Discharge:  10 minutes  Signed: Bertram Millard Wilburn Keir 12/14/2021, 1:41 PM

## 2021-12-17 ENCOUNTER — Other Ambulatory Visit (HOSPITAL_BASED_OUTPATIENT_CLINIC_OR_DEPARTMENT_OTHER): Payer: Self-pay

## 2021-12-21 ENCOUNTER — Other Ambulatory Visit: Payer: Self-pay | Admitting: Hematology and Oncology

## 2021-12-21 ENCOUNTER — Encounter: Payer: Self-pay | Admitting: *Deleted

## 2021-12-21 NOTE — Progress Notes (Signed)
Received message from [facility] team stating pt insurance preferred drug is Zometa and not Prolia. RN reviewed with MD who has changed pt tx plan to Zometa q12 months.  RN placed call to pt to alert pt, verbalized understanding.  RN also placed message to scheduling to change injection appt to infusion.

## 2021-12-24 ENCOUNTER — Inpatient Hospital Stay: Payer: Medicare Other

## 2021-12-24 ENCOUNTER — Ambulatory Visit: Payer: Medicare Other

## 2021-12-24 ENCOUNTER — Other Ambulatory Visit: Payer: Self-pay

## 2021-12-24 ENCOUNTER — Inpatient Hospital Stay: Payer: Medicare Other | Attending: Hematology and Oncology

## 2021-12-24 VITALS — BP 123/68 | HR 45 | Temp 98.0°F | Resp 18

## 2021-12-24 DIAGNOSIS — Z923 Personal history of irradiation: Secondary | ICD-10-CM | POA: Diagnosis not present

## 2021-12-24 DIAGNOSIS — Z170 Estrogen receptor positive status [ER+]: Secondary | ICD-10-CM | POA: Diagnosis not present

## 2021-12-24 DIAGNOSIS — C50411 Malignant neoplasm of upper-outer quadrant of right female breast: Secondary | ICD-10-CM | POA: Insufficient documentation

## 2021-12-24 DIAGNOSIS — M810 Age-related osteoporosis without current pathological fracture: Secondary | ICD-10-CM | POA: Diagnosis not present

## 2021-12-24 DIAGNOSIS — Z79810 Long term (current) use of selective estrogen receptor modulators (SERMs): Secondary | ICD-10-CM | POA: Insufficient documentation

## 2021-12-24 MED ORDER — ZOLEDRONIC ACID 4 MG/100ML IV SOLN
4.0000 mg | Freq: Once | INTRAVENOUS | Status: AC
  Administered 2021-12-24: 4 mg via INTRAVENOUS
  Filled 2021-12-24: qty 100

## 2021-12-24 MED ORDER — SODIUM CHLORIDE 0.9 % IV SOLN: Freq: Once | INTRAVENOUS | Status: AC

## 2021-12-24 NOTE — Patient Instructions (Signed)

## 2021-12-24 NOTE — Progress Notes (Signed)
Reclast is non-formulary/non stock at [facility]. J code for Reclast is same as generic zoledronic acid. Sub'd Zoledronic acid 4 mg for Reclast 5 mg and will administer yearly as planned.  Kennith Center, Pharm.D., CPP 12/24/2021@10 :14 AM

## 2021-12-27 ENCOUNTER — Other Ambulatory Visit: Payer: Self-pay | Admitting: Family Medicine

## 2022-01-26 ENCOUNTER — Other Ambulatory Visit (HOSPITAL_COMMUNITY): Payer: Medicare Other

## 2022-01-31 ENCOUNTER — Ambulatory Visit: Payer: Medicare Other | Admitting: Hematology and Oncology

## 2022-03-03 ENCOUNTER — Encounter: Payer: Self-pay | Admitting: Internal Medicine

## 2022-03-11 ENCOUNTER — Telehealth: Payer: Self-pay | Admitting: *Deleted

## 2022-03-11 NOTE — Telephone Encounter (Signed)
John, ? ?Please review. Okay for [place]? Please advise. Thank you, Desiderio Dolata pv ?

## 2022-03-12 NOTE — Telephone Encounter (Signed)
Robbin,  This pt is cleared for anesthetic care at [facility].   Thanks,   Kiree Dejarnette 

## 2022-03-15 NOTE — Telephone Encounter (Signed)
[name], ? ?In both instances they were able to intubate this pt with a "regular" laryngoscope.  I have had the CRNA who will provide care review the chart and they are comfortable caring for her at [facility] ? ?Thanks, ? ?Osvaldo Angst ?

## 2022-03-15 NOTE — Telephone Encounter (Signed)
Noted thank you

## 2022-03-15 NOTE — Telephone Encounter (Signed)
JOHN. THIS PT HAS HAD 2 DIFFICULT INTUBATIONS - ONE 2023 AND THE OTHER 2020 ?PLEASE REVIEW AND ADVISE  THX  MARIE PV  ?

## 2022-03-30 ENCOUNTER — Ambulatory Visit (AMBULATORY_SURGERY_CENTER): Payer: Medicare Other | Admitting: *Deleted

## 2022-03-30 VITALS — Ht 65.25 in | Wt 133.0 lb

## 2022-03-30 DIAGNOSIS — Z86010 Personal history of colonic polyps: Secondary | ICD-10-CM

## 2022-03-30 NOTE — Progress Notes (Signed)
No egg or soy allergy known to patient  ? issues known to pt with past sedation with any surgeries or procedures of PONV  ?Patient denies ever being told they had issues or difficulty with intubation but in 2020 and 2023 difficulty noted but Osvaldo Angst CRNA reviewed both procedures and OK'd pt for [place]  ?No FH of Malignant Hyperthermia ?Pt is not on diet pills ?Pt is not on  home 02  ?Pt is not on blood thinners  ?Pt denies issues with constipation - uses miralax and senokot prn- uses prunes recently- main issue is with travel causing constipation  so if eats prunes and takes probiotic daily has a regular daily soft bm  ?No A fib or A flutter ? ?PV completed over the phone. Pt verified name, DOB, address and insurance during PV today.  ?Pt mailed instruction packet with copy of consent form to read and not return, and instructions.  ?Pt encouraged to call with questions or issues.  ?If pt has My chart, procedure instructions sent via My Chart  ?Insurance confirmed with pt at [facility] today  ? ?

## 2022-04-13 ENCOUNTER — Encounter: Payer: Self-pay | Admitting: Internal Medicine

## 2022-04-19 ENCOUNTER — Encounter: Payer: Self-pay | Admitting: Internal Medicine

## 2022-04-19 ENCOUNTER — Ambulatory Visit (AMBULATORY_SURGERY_CENTER): Payer: Medicare Other | Admitting: Internal Medicine

## 2022-04-19 VITALS — BP 180/88 | HR 68 | Temp 98.7°F | Resp 16 | Ht 65.0 in | Wt 133.0 lb

## 2022-04-19 DIAGNOSIS — Z86010 Personal history of colonic polyps: Secondary | ICD-10-CM

## 2022-04-19 DIAGNOSIS — K635 Polyp of colon: Secondary | ICD-10-CM | POA: Diagnosis not present

## 2022-04-19 DIAGNOSIS — D125 Benign neoplasm of sigmoid colon: Secondary | ICD-10-CM

## 2022-04-19 MED ORDER — SODIUM CHLORIDE 0.9 % IV SOLN: 500.0000 mL | Freq: Once | INTRAVENOUS | Status: DC

## 2022-04-19 NOTE — Progress Notes (Signed)
[facility] History and Physical   Primary Care Physician:  Mckinley Jewel, MD   Reason for Procedure:   Hx colon polyps  Plan:    colonoscopy     HPI: [name] is a 76 y.o. female here for surveillance colonoscopy  8 mm adenoma removed April 2008 Dr. Carlean Purl 5 mm polyp removed 03/05/2012 - adenoma 3 mm polyp removed 03/2017 mucosal polyp Past Medical History:  Diagnosis Date   Allergic rhinitis    Allergy    Arthritis    "knees" (08/28/2018)   Breast cancer, right breast ([place]) 05/20/2013   "no chemo" (08/28/2018)   Chronic lower back pain    Clotting disorder ([place])    Diverticulosis    DOE (dyspnea on exertion)    Dysrhythmia    GERD (gastroesophageal reflux disease)    uses levsin once every 2-3 months   Graves' disease    2011   Heart palpitations    History of kidney stones    HLD (hyperlipidemia)    Hx of radiation therapy 08/13/13- 09/06/13   right breast 4250 cGy 17 sessions   Hypertension    controlled   IBS (irritable bowel syndrome)    Lumbar herniated disc    L4-L5   Major depression    MVP (mitral valve prolapse)    Osteopenia    Osteoporosis    zometa infusion 11/2021- is yearly infusion   Personal history of radiation therapy    Pneumonia 08/2019   PONV (postoperative nausea and vomiting)    "when I have general anesthetic" (08/28/2018)   Pulmonary emboli (HCC)    after knee replacement   PVC (premature ventricular contraction)    Takes metoprolol PRN for  pvc's   Tubular adenoma of colon 03/07/2012   Uterine hyperplasia    vaginal [place] every 6 months and had 2 biopsies so far with chnages noted    Past Surgical History:  Procedure Laterality Date   BREAST BIOPSY Right 2014   BREAST EXCISIONAL BIOPSY Right    BREAST LUMPECTOMY WITH NEEDLE LOCALIZATION AND AXILLARY SENTINEL LYMPH NODE BX Right 06/11/2013   Procedure: BREAST LUMPECTOMY WITH NEEDLE LOCALIZATION AND AXILLARY SENTINEL LYMPH NODE BX;  Surgeon: Harl Bowie, MD;  Location: [facility];  Service: General;  Laterality: Right;  Needle localization BCG 7:30   COLONOSCOPY     COLONOSCOPY W/ BIOPSIES AND POLYPECTOMY  multiple   CYSTOSCOPY WITH STENT PLACEMENT Left 08/15/2019   Procedure: CYSTOSCOPY, left retrograde pyleogram  WITH STENT EXCHANGE left and foley placement;  Surgeon: Franchot Gallo, MD;  Location: [facility];  Service: Urology;  Laterality: Left;   IR URETERAL STENT LEFT NEW ACCESS W/O SEP NEPHROSTOMY CATH  08/12/2019   IR URETERAL STENT LEFT NEW ACCESS W/O SEP NEPHROSTOMY CATH  12/09/2021   KNEE ARTHROSCOPY Left 2006   NEPHROLITHOTOMY Left 08/12/2019   Procedure: NEPHROLITHOTOMY PERCUTANEOUS/INSERTION DOUBLE J STENT;  Surgeon: Franchot Gallo, MD;  Location: [facility];  Service: Urology;  Laterality: Left;  2 HRS   NEPHROLITHOTOMY Left 12/09/2021   Procedure: NEPHROLITHOTOMY PERCUTANEOUS;  Surgeon: Franchot Gallo, MD;  Location: [facility];  Service: Urology;  Laterality: Left;  2 HRS   POLYPECTOMY     TOTAL KNEE ARTHROPLASTY Right 08/28/2018   Procedure: RIGHT TOTAL KNEE ARTHROPLASTY;  Surgeon: Garald Balding, MD;  Location: [facility];  Service: Orthopedics;  Laterality: Right;   uterine biopsy     x 2 with no sedation    Prior to Admission medications  Medication Sig Start Date End Date Taking? Authorizing Provider  atorvastatin (LIPITOR) 10 MG tablet Take 10 mg by mouth at bedtime.    Yes [provider]  buPROPion (WELLBUTRIN XL) 300 MG 24 hr tablet 1 tablet in the morning Orally Once a day 05/05/21  Yes Fayrene Helper, MD  Calcium Carb-Cholecalciferol (CALCIUM 600 + D PO) Take 1 tablet by mouth at bedtime.   Yes [provider]  ECHINACEA PO Take 1,300 mg by mouth daily as needed (immune support).   Yes [provider]  fluticasone (FLONASE) 50 MCG/ACT nasal spray Place 2 sprays into the nose daily.    Yes [provider]  ibuprofen (ADVIL) 200 MG tablet Take 200-400 mg by mouth every 8  (eight) hours as needed (pain.).   Yes [provider]  metoprolol tartrate (LOPRESSOR) 25 MG tablet Take 0.5 tablets (12.5 mg total) by mouth 2 (two) times daily. 01/28/20  Yes Nicholas Lose, MD  Multiple Vitamin (MULTIVITAMIN WITH MINERALS) TABS tablet Take 1 tablet by mouth daily.   Yes [provider]  polyethylene glycol (MIRALAX / GLYCOLAX) 17 g packet Take 17 g by mouth daily as needed for moderate constipation.   Yes [provider]  Probiotic Product (PROBIOTIC DAILY PO) Take 1 capsule by mouth daily.   Yes [provider]  Sod Fluoride-Potassium Nitrate (PREVIDENT 5000 ENAMEL PROTECT) 1.1-5 % GEL Place 1 application onto teeth daily.   Yes [provider]  SYNTHROID 75 MCG tablet TAKE ONE TABLET EVERY MORNING ON AN EMPTY STOMACH 09/01/21  Yes Fayrene Helper, MD  Turmeric (QC TUMERIC COMPLEX PO) Take 1,500 mg by mouth daily.   Yes [provider]  hyoscyamine (LEVSIN SL) 0.125 MG SL tablet Take 1 tablet by mouth every 6-8 hours as needed for abdominal pain Patient taking differently: Take 0.125 mg by mouth every 6 (six) hours as needed (heartburn). 08/27/20   Noralyn Pick, NP  senna (SENOKOT) 8.6 MG tablet Take 1 tablet by mouth daily as needed for constipation.    [provider]  zolpidem (AMBIEN) 10 MG tablet Take 2.5 mg by mouth at bedtime as needed for sleep.  03/22/19   [provider]    Current Outpatient Medications  Medication Sig Dispense Refill   atorvastatin (LIPITOR) 10 MG tablet Take 10 mg by mouth at bedtime.      buPROPion (WELLBUTRIN XL) 300 MG 24 hr tablet 1 tablet in the morning Orally Once a day 30 tablet 0   Calcium Carb-Cholecalciferol (CALCIUM 600 + D PO) Take 1 tablet by mouth at bedtime.     ECHINACEA PO Take 1,300 mg by mouth daily as needed (immune support).     fluticasone (FLONASE) 50 MCG/ACT nasal spray Place 2 sprays into the nose daily.      ibuprofen (ADVIL) 200 MG  tablet Take 200-400 mg by mouth every 8 (eight) hours as needed (pain.).     metoprolol tartrate (LOPRESSOR) 25 MG tablet Take 0.5 tablets (12.5 mg total) by mouth 2 (two) times daily.     Multiple Vitamin (MULTIVITAMIN WITH MINERALS) TABS tablet Take 1 tablet by mouth daily.     polyethylene glycol (MIRALAX / GLYCOLAX) 17 g packet Take 17 g by mouth daily as needed for moderate constipation.     Probiotic Product (PROBIOTIC DAILY PO) Take 1 capsule by mouth daily.     Sod Fluoride-Potassium Nitrate (PREVIDENT 5000 ENAMEL PROTECT) 1.1-5 % GEL Place 1 application onto teeth daily.  SYNTHROID 75 MCG tablet TAKE ONE TABLET EVERY MORNING ON AN EMPTY STOMACH 30 tablet 3   Turmeric (QC TUMERIC COMPLEX PO) Take 1,500 mg by mouth daily.     hyoscyamine (LEVSIN SL) 0.125 MG SL tablet Take 1 tablet by mouth every 6-8 hours as needed for abdominal pain (Patient taking differently: Take 0.125 mg by mouth every 6 (six) hours as needed (heartburn).) 60 tablet 2   senna (SENOKOT) 8.6 MG tablet Take 1 tablet by mouth daily as needed for constipation.     zolpidem (AMBIEN) 10 MG tablet Take 2.5 mg by mouth at bedtime as needed for sleep.      Current Facility-Administered Medications  Medication Dose Route Frequency Provider Last Rate Last Admin   0.9 %  sodium chloride infusion  500 mL Intravenous Once Gatha Mayer, MD        Allergies as of 04/19/2022 - Review Complete 04/19/2022  Allergen Reaction Noted   Dilaudid [hydromorphone hcl] Nausea And Vomiting 08/28/2018   Terak [terramycin] Rash 01/06/2012   Oxybutynin Other (See Comments) 12/24/2021   Amoxicillin Diarrhea 01/06/2012   Erythromycin Rash 12/10/2021    Family History  Problem Relation Age of Onset   Heart disease Mother 19       MI   Lung cancer Mother    Hypertension Mother    Alcohol abuse Father    COPD Father    Breast cancer Maternal Aunt    Pancreatic cancer Maternal Aunt    Colon cancer Maternal Grandfather 58   Diabetes  Paternal Grandmother    Stomach cancer Neg Hx    Colon polyps Neg Hx    Esophageal cancer Neg Hx    Rectal cancer Neg Hx     Social History   Socioeconomic History   Marital status: Married    Spouse name: Not on file   Number of children: 1   Years of education: Not on file   Highest education level: Not on file  Occupational History   Occupation: Programmer, multimedia: UNEMPLOYED  Tobacco Use   Smoking status: Never   Smokeless tobacco: Never  Vaping Use   Vaping Use: Never used  Substance and Sexual Activity   Alcohol use: Yes    Comment: 08/28/2018 "1-2 drinks/month". occas.   Drug use: Never   Sexual activity: Not Currently    Birth control/protection: Post-menopausal    Comment: menarche 89, G 1 age 75, menopause 2, HRT x 9 yrs  Other Topics Concern   Not on file  Social History Narrative   Not on file   Social Determinants of Health   Financial Resource Strain: Not on file  Food Insecurity: Not on file  Transportation Needs: Not on file  Physical Activity: Not on file  Stress: Not on file  Social Connections: Not on file  Intimate Partner Violence: Not on file    Review of Systems:  All other review of systems negative except as mentioned in the HPI.  Physical Exam: Vital signs BP (!) 199/85   Pulse (!) 56   Temp 98.7 F (37.1 C)   Resp 14   Ht '5\' 5"'$  (1.651 m)   Wt 133 lb (60.3 kg)   SpO2 100%   BMI 22.13 kg/m   General:   Alert,  Well-developed, well-nourished, pleasant and cooperative in NAD Lungs:  Clear throughout to auscultation.   Heart:  Regular rate and rhythm; no murmurs, clicks, rubs,  or gallops. Abdomen:  Soft, nontender and nondistended.  Normal bowel sounds.   Neuro/Psych:  Alert and cooperative. Normal mood and affect. A and O x 3   '@Haniah Penny'$  Simonne Maffucci, MD, [facility] [phone] (pager) 04/19/2022 8:32 AM@

## 2022-04-19 NOTE — Op Note (Signed)
[facility] Patient Name: [name] Procedure Date: 04/19/2022 8:29 AM MRN: [id number] Endoscopist: Gatha Mayer , MD Age: 76 Referring MD:  Date of Birth: 04/02/1946 Gender: Female Account #: [id number] Procedure:                Colonoscopy Indications:              Surveillance: Personal history of adenomatous                            polyps on last colonoscopy 5 years ago Medicines:                Monitored Anesthesia Care Procedure:                Pre-Anesthesia Assessment:                           - Prior to the procedure, a History and Physical                            was performed, and patient medications and                            allergies were reviewed. The patient's tolerance of                            previous anesthesia was also reviewed. The risks                            and benefits of the procedure and the sedation                            options and risks were discussed with the patient.                            All questions were answered, and informed consent                            was obtained. Prior Anticoagulants: The patient has                            taken no previous anticoagulant or antiplatelet                            agents. ASA Grade Assessment: II - A patient with                            mild systemic disease. After reviewing the risks                            and benefits, the patient was deemed in                            satisfactory condition to undergo the procedure.  After obtaining informed consent, the colonoscope                            was passed under direct vision. Throughout the                            procedure, the patient's blood pressure, pulse, and                            oxygen saturations were monitored continuously. The                            Olympus PCF-H190DL (#[id number]) Colonoscope was                            introduced through the anus  and advanced to the the                            cecum, identified by appendiceal orifice and                            ileocecal valve. The colonoscopy was performed                            without difficulty. The patient tolerated the                            procedure well. The quality of the bowel                            preparation was adequate. The ileocecal valve,                            appendiceal orifice, and rectum were photographed.                            The bowel preparation used was Miralax via split                            dose instruction. Scope In: 8:41:27 AM Scope Out: 8:59:52 AM Scope Withdrawal Time: 0 hours 10 minutes 57 seconds  Total Procedure Duration: 0 hours 18 minutes 25 seconds  Findings:                 The perianal and digital rectal examinations were                            normal.                           A 2 mm polyp was found in the sigmoid colon. The                            polyp was sessile. The polyp was removed with a  cold snare. Resection and retrieval were complete.                            Verification of patient identification for the                            specimen was done. Estimated blood loss was minimal.                           Multiple diverticula were found in the sigmoid                            colon, descending colon and ascending colon.                           The exam was otherwise without abnormality on                            direct and retroflexion views. Complications:            No immediate complications. Estimated Blood Loss:     Estimated blood loss was minimal. Impression:               - One 2 mm polyp in the sigmoid colon, removed with                            a cold snare. Resected and retrieved.                           - Diverticulosis in the sigmoid colon, in the                            descending colon and in the ascending colon.                            - The examination was otherwise normal on direct                            and retroflexion views.                           - Personal history of colonic polyps. 8 mm adenoma                            removed April 2008 Dr. Carlean Purl                           5 mm polyp removed 03/05/2012 - adenoma                           3 mm polyp removed 03/2017 mucosal polyp Recommendation:           - Patient has a contact number available for  emergencies. The signs and symptoms of potential                            delayed complications were discussed with the                            patient. Return to normal activities tomorrow.                            Written discharge instructions were provided to the                            patient.                           - Resume previous diet.                           - Continue present medications.                           - Await pathology results.                           - No recommendation at this time regarding repeat                            colonoscopy due to age. Await pathology review but                            think this will be last routine colonoscopy. Gatha Mayer, MD 04/19/2022 9:06:54 AM This report has been signed electronically.

## 2022-04-19 NOTE — Progress Notes (Signed)
Report to PACU, RN, vss, BBS= Clear.  

## 2022-04-19 NOTE — Patient Instructions (Addendum)
I found and removed one tiny polyp and saw your diverticulosis.  I will have the polyp analyzed.  I do not anticipate recommending a routine repeat colonoscopy but will wait for the pathology results before confirming that.  I appreciate the opportunity to care for you. Gatha Mayer, MD, [facility] REad all of the handouts given to you your recovery room nurse.   YOU HAD AN ENDOSCOPIC PROCEDURE TODAY AT [facility]:   Refer to the procedure report that was given to you for any specific questions about what was found during the examination.  If the procedure report does not answer your questions, please call your gastroenterologist to clarify.  If you requested that your care partner not be given the details of your procedure findings, then the procedure report has been included in a sealed envelope for you to review at your convenience later.  YOU SHOULD EXPECT: Some feelings of bloating in the abdomen. Passage of more gas than usual.  Walking can help get rid of the air that was put into your GI tract during the procedure and reduce the bloating. If you had a lower endoscopy (such as a colonoscopy or flexible sigmoidoscopy) you may notice spotting of blood in your stool or on the toilet paper. If you underwent a bowel prep for your procedure, you may not have a normal bowel movement for a few days.  Please Note:  You might notice some irritation and congestion in your nose or some drainage.  This is from the oxygen used during your procedure.  There is no need for concern and it should clear up in a day or so.  SYMPTOMS TO REPORT IMMEDIATELY:  Following lower endoscopy (colonoscopy or flexible sigmoidoscopy):  Excessive amounts of blood in the stool  Significant tenderness or worsening of abdominal pains  Swelling of the abdomen that is new, acute  Fever of 100F or higher   For urgent or emergent issues, a gastroenterologist can be reached at any hour by calling [phone]  [phone]. Do not use MyChart messaging for urgent concerns.    DIET:  We do recommend a small meal at first, but then you may proceed to your regular diet.  Drink plenty of fluids but you should avoid alcoholic beverages for 24 hours. Try to increase the fiber in your diet, an drink plenty of water.  ACTIVITY:  You should plan to take it easy for the rest of today and you should NOT DRIVE or use heavy machinery until tomorrow (because of the sedation medicines used during the test).    FOLLOW UP: Our staff will call the number listed on your records 48-72 hours following your procedure to check on you and address any questions or concerns that you may have regarding the information given to you following your procedure. If we do not reach you, we will leave a message.  We will attempt to reach you two times.  During this call, we will ask if you have developed any symptoms of COVID 19. If you develop any symptoms (ie: fever, flu-like symptoms, shortness of breath, cough etc.) before then, please call [phone].  If you test positive for Covid 19 in the 2 weeks post procedure, please call and report this information to [place].    If any biopsies were taken you will be contacted by phone or by letter within the next 1-3 weeks.  Please call us at [phone] if you have not heard about the biopsies in 3 weeks.  SIGNATURES/CONFIDENTIALITY: You and/or your care partner have signed paperwork which will be entered into your electronic medical record.  These signatures attest to the fact that that the information above on your After Visit Summary has been reviewed and is understood.  Full responsibility of the confidentiality of this discharge information lies with you and/or your care-partner.

## 2022-04-19 NOTE — Progress Notes (Signed)
Called to room to assist during endoscopic procedure.  Patient ID and intended procedure confirmed with present staff. Received instructions for my participation in the procedure from the performing physician.  

## 2022-04-19 NOTE — Progress Notes (Signed)
Pt's states no medical or surgical changes since previsit or office visit. 

## 2022-04-20 ENCOUNTER — Telehealth: Payer: Self-pay

## 2022-04-20 ENCOUNTER — Telehealth: Payer: Self-pay | Admitting: *Deleted

## 2022-04-20 NOTE — Telephone Encounter (Signed)
  Follow up Call-     04/19/2022    7:56 AM  Call back number  Post procedure Call Back phone  # [phone]  Permission to leave phone message Yes    Post op call attempted, no answer, left WM.

## 2022-04-20 NOTE — Telephone Encounter (Signed)
  Follow up Call-     04/19/2022    7:56 AM  Call back number  Post procedure Call Back phone  # [phone]  Permission to leave phone message Yes     Patient questions:  Do you have a fever, pain , or abdominal swelling? No. Pain Score  0 *  Have you tolerated food without any problems? Yes.    Have you been able to return to your normal activities? Yes.    Do you have any questions about your discharge instructions: Diet   No. Medications  No. Follow up visit  No.  Do you have questions or concerns about your Care? No.  Actions: * If pain score is 4 or above: No action needed, pain <4.

## 2022-04-26 ENCOUNTER — Encounter: Payer: Self-pay | Admitting: Internal Medicine

## 2022-06-06 ENCOUNTER — Other Ambulatory Visit: Payer: Medicare Other

## 2022-06-06 ENCOUNTER — Ambulatory Visit: Payer: Medicare Other

## 2022-07-12 ENCOUNTER — Ambulatory Visit (INDEPENDENT_AMBULATORY_CARE_PROVIDER_SITE_OTHER): Payer: Medicare Other

## 2022-07-12 ENCOUNTER — Encounter: Payer: Self-pay | Admitting: Physician Assistant

## 2022-07-12 ENCOUNTER — Ambulatory Visit: Payer: Medicare Other | Admitting: Physician Assistant

## 2022-07-12 DIAGNOSIS — M25531 Pain in right wrist: Secondary | ICD-10-CM

## 2022-07-12 DIAGNOSIS — S52501A Unspecified fracture of the lower end of right radius, initial encounter for closed fracture: Secondary | ICD-10-CM | POA: Insufficient documentation

## 2022-07-12 NOTE — Progress Notes (Signed)
Office Visit Note   Patient: [name]           Date of Birth: 23-Dec-1945           MRN: [id number] Visit Date: 07/12/2022              Requested by: Mckinley Jewel, MD [address] [place],  [place] [address] PCP: Mckinley Jewel, MD  Chief Complaint  Patient presents with   Right Wrist - Follow-up      HPI: Patient is a pleasant active 76 year old woman who is a patient of Dr. Durward Fortes.  She is about 5 weeks status post falling while playing pickle ball.  She was holding the pickleball paddle in her right hand and fell onto it most likely on her outstretched hand.  She did have pain bruising and swelling.  She self treated this with a splint.  While the swelling has resolved as well as the ecchymosis she does feel like the hand and arm are weak.  She denies any paresthesias.  She has begun to work with a physical therapist who does not want to do therapy until she has her wrist checked out.  Assessment & Plan: Visit Diagnoses:  1. Pain in right wrist   2. Closed fracture of distal end of right radius, unspecified fracture morphology, initial encounter     Plan: She does have a impacted subacute fracture of the right distal radius.  I did counsel her that even if she had seen [place] acutely more than likely would not not have recommended surgery.  She also has significant arthritis in the [facility] and scaphoid trapezium and trapezoid joints.  This is not particularly symptomatic to her.  I think she should follow-up and work with a therapist come back and see [place] in a month.  Would avoid lifting heavy objects  Follow-Up Instructions: 1 month  Ortho Exam  Patient is alert, oriented, no adenopathy, well-dressed, normal affect, normal respiratory effort. Right wrist she has no swelling no ecchymosis no cellulitis she has an easily palpable radial pulse.  She is able to oppose all her fingers sensation is intact fingers are warm and pink.  She does have slight  decrease strength.  Minimal to no tenderness but she is rather stiff with range of motion of her wrist  Imaging: XR Wrist Complete Right  Result Date: 07/12/2022 Radiographs of her right wrist demonstrate an impacted nonarticular subacute fracture of the right distal radius.  Ulnar styloid is intact she has significant arthritic changes of the first [facility] joint as well as the scaphoid trapezium and trapezoid joint.  X-rays were reviewed today with Dr. Tempie Donning  No images are attached to the encounter.  Labs: Lab Results  Component Value Date   ESRSEDRATE 1 09/15/2016   CRP <1.0 08/27/2020   CRP <0.1 (L) 09/15/2016   REPTSTATUS 08/18/2018 FINAL 08/17/2018   CULT  08/17/2018    NO GROWTH Performed at [facility], [place] [address] [place], [place] [address]      Lab Results  Component Value Date   ALBUMIN 4.0 03/11/2021   ALBUMIN 4.3 08/27/2020   ALBUMIN 4.0 08/17/2018    No results found for: "MG" No results found for: "VD25OH"  No results found for: "PREALBUMIN"    Latest Ref Rng & Units 12/10/2021    4:08 AM 12/06/2021    8:27 AM 03/11/2021    3:47 PM  CBC EXTENDED  WBC 4.0 - 10.5 K/uL  3.5  4.7   RBC 3.87 - 5.11 MIL/uL  4.21  4.04   Hemoglobin 12.0 - 15.0 g/dL 13.0  14.0  12.8   HCT 36.0 - 46.0 % 39.7  43.2  38.6   Platelets 150 - 400 K/uL  214  240.0   NEUT# 1.4 - 7.7 K/uL   3.0   Lymph# 0.7 - 4.0 K/uL   0.9      There is no height or weight on file to calculate BMI.  Orders:  Orders Placed This Encounter  Procedures   XR Wrist Complete Right   No orders of the defined types were placed in this encounter.    Procedures: No procedures performed  Clinical Data: No additional findings.  ROS:  All other systems negative, except as noted in the HPI. Review of Systems  Objective: Vital Signs: There were no vitals taken for this visit.  Specialty Comments:  No specialty comments available.  PMFS History: Patient Active Problem List   Diagnosis  Date Noted   Closed fracture of right distal radius 07/12/2022   Calculus of kidney 12/09/2021   Osteoporosis 12/07/2021   SOB (shortness of breath) 04/07/2020   Nonrheumatic mitral valve regurgitation 04/07/2020   Palpitations 04/07/2020   Educated about COVID-19 virus infection 04/07/2020   Pain in right knee 02/20/2020   Kidney stone 08/12/2019   Hypothyroidism 10/03/2018   PE (pulmonary thromboembolism) ([place] [facility]) 10/03/2018   Chronic diastolic CHF (congestive heart failure) ([facility]) 10/03/2018   Macrocytic anemia 10/03/2018   Unilateral primary osteoarthritis, right knee 08/28/2018   History of total right knee replacement 08/28/2018   Bilateral primary osteoarthritis of knee 03/12/2018   Essential hypertension 08/26/2016   Mitral valve disorder 08/26/2016   History of palpitations 08/26/2016   Dyslipidemia 08/26/2016   Internal hemorrhoids 08/03/2015   Hx of radiation therapy    Breast cancer of upper-outer quadrant of right female breast ([place]) 05/23/2013   Personal history of colonic polyps 01/06/2012   Irritable bowel syndrome - constipation predominant 01/06/2012   Past Medical History:  Diagnosis Date   Allergic rhinitis    Allergy    Arthritis    "knees" (08/28/2018)   Breast cancer, right breast ([facility]) 05/20/2013   "no chemo" (08/28/2018)   Chronic lower back pain    Clotting disorder ([place])    Diverticulosis    DOE (dyspnea on exertion)    Dysrhythmia    GERD (gastroesophageal reflux disease)    uses levsin once every 2-3 months   Graves' disease    2011   Heart palpitations    History of kidney stones    HLD (hyperlipidemia)    Hx of radiation therapy 08/13/13- 09/06/13   right breast 4250 cGy 17 sessions   Hypertension    controlled   IBS (irritable bowel syndrome)    Lumbar herniated disc    L4-L5   Major depression    MVP (mitral valve prolapse)    Osteopenia    Osteoporosis    zometa infusion 11/2021- is yearly infusion   Personal history of radiation  therapy    Pneumonia 08/2019   PONV (postoperative nausea and vomiting)    "when I have general anesthetic" (08/28/2018)   Pulmonary emboli ([facility])    after knee replacement   PVC (premature ventricular contraction)    Takes metoprolol PRN for  pvc's   Tubular adenoma of colon 03/07/2012   Uterine hyperplasia    vaginal [place] every 6 months and had 2 biopsies so far with  chnages noted    Family History  Problem Relation Age of Onset   Heart disease Mother 73       MI   Lung cancer Mother    Hypertension Mother    Alcohol abuse Father    COPD Father    Breast cancer Maternal Aunt    Pancreatic cancer Maternal Aunt    Colon cancer Maternal Grandfather 27   Diabetes Paternal Grandmother    Stomach cancer Neg Hx    Colon polyps Neg Hx    Esophageal cancer Neg Hx    Rectal cancer Neg Hx     Past Surgical History:  Procedure Laterality Date   BREAST BIOPSY Right 2014   BREAST EXCISIONAL BIOPSY Right    BREAST LUMPECTOMY WITH NEEDLE LOCALIZATION AND AXILLARY SENTINEL LYMPH NODE BX Right 06/11/2013   Procedure: BREAST LUMPECTOMY WITH NEEDLE LOCALIZATION AND AXILLARY SENTINEL LYMPH NODE BX;  Surgeon: Harl Bowie, MD;  Location: [facility];  Service: General;  Laterality: Right;  Needle localization BCG 7:30   COLONOSCOPY     COLONOSCOPY W/ BIOPSIES AND POLYPECTOMY  multiple   CYSTOSCOPY WITH STENT PLACEMENT Left 08/15/2019   Procedure: CYSTOSCOPY, left retrograde pyleogram  WITH STENT EXCHANGE left and foley placement;  Surgeon: Franchot Gallo, MD;  Location: [facility];  Service: Urology;  Laterality: Left;   IR URETERAL STENT LEFT NEW ACCESS W/O SEP NEPHROSTOMY CATH  08/12/2019   IR URETERAL STENT LEFT NEW ACCESS W/O SEP NEPHROSTOMY CATH  12/09/2021   KNEE ARTHROSCOPY Left 2006   NEPHROLITHOTOMY Left 08/12/2019   Procedure: NEPHROLITHOTOMY PERCUTANEOUS/INSERTION DOUBLE J STENT;  Surgeon: Franchot Gallo, MD;  Location: [facility];  Service: Urology;  Laterality: Left;  2 HRS    NEPHROLITHOTOMY Left 12/09/2021   Procedure: NEPHROLITHOTOMY PERCUTANEOUS;  Surgeon: Franchot Gallo, MD;  Location: [facility];  Service: Urology;  Laterality: Left;  2 HRS   POLYPECTOMY     TOTAL KNEE ARTHROPLASTY Right 08/28/2018   Procedure: RIGHT TOTAL KNEE ARTHROPLASTY;  Surgeon: Garald Balding, MD;  Location: [facility];  Service: Orthopedics;  Laterality: Right;   uterine biopsy     x 2 with no sedation   Social History   Occupational History   Occupation: Programmer, multimedia: UNEMPLOYED  Tobacco Use   Smoking status: Never   Smokeless tobacco: Never  Vaping Use   Vaping Use: Never used  Substance and Sexual Activity   Alcohol use: Yes    Comment: 08/28/2018 "1-2 drinks/month". occas.   Drug use: Never   Sexual activity: Not Currently    Birth control/protection: Post-menopausal    Comment: menarche 80, 84 1 age 44, menopause 75, HRT x 9 yrs

## 2022-07-13 ENCOUNTER — Telehealth: Payer: Self-pay | Admitting: Physician Assistant

## 2022-07-13 DIAGNOSIS — M25531 Pain in right wrist: Secondary | ICD-10-CM

## 2022-07-13 DIAGNOSIS — S52501A Unspecified fracture of the lower end of right radius, initial encounter for closed fracture: Secondary | ICD-10-CM

## 2022-07-13 NOTE — Telephone Encounter (Signed)
Pt called requesting a referral for OT. Please call pt at [phone].

## 2022-07-15 ENCOUNTER — Other Ambulatory Visit: Payer: Self-pay

## 2022-07-15 ENCOUNTER — Encounter: Payer: Self-pay | Admitting: Rehabilitative and Restorative Service Providers"

## 2022-07-15 ENCOUNTER — Ambulatory Visit: Payer: Medicare Other | Admitting: Rehabilitative and Restorative Service Providers"

## 2022-07-15 DIAGNOSIS — M25531 Pain in right wrist: Secondary | ICD-10-CM

## 2022-07-15 DIAGNOSIS — M25631 Stiffness of right wrist, not elsewhere classified: Secondary | ICD-10-CM

## 2022-07-15 DIAGNOSIS — M25641 Stiffness of right hand, not elsewhere classified: Secondary | ICD-10-CM

## 2022-07-15 DIAGNOSIS — M6281 Muscle weakness (generalized): Secondary | ICD-10-CM

## 2022-07-15 DIAGNOSIS — R600 Localized edema: Secondary | ICD-10-CM | POA: Diagnosis not present

## 2022-07-15 DIAGNOSIS — R278 Other lack of coordination: Secondary | ICD-10-CM

## 2022-07-15 NOTE — Therapy (Signed)
OUTPATIENT OCCUPATIONAL THERAPY ORTHO EVALUATION  Patient Name: [name] MRN: [id number] DOB:1946-03-16, 76 y.o., female Today's Date: 07/15/2022  PCP: Dr. Early Osmond REFERRING PROVIDER: Persons, Bevely Palmer, [place]   OT End of Session - 07/15/22 0805     Visit Number 1    Number of Visits 10    Date for OT Re-Evaluation 08/26/22    Authorization Type [employer] Medicare    Progress Note Due on Visit 10    OT Start Time 0805    OT Stop Time [phone]    OT Time Calculation (min) 47 min    Equipment Utilized During Treatment --    Activity Tolerance Patient tolerated treatment well;No increased pain;Patient limited by pain;Patient limited by fatigue    Behavior During Therapy [facility] for tasks assessed/performed             Past Medical History:  Diagnosis Date   Allergic rhinitis    Allergy    Arthritis    "knees" (08/28/2018)   Breast cancer, right breast ([place]) 05/20/2013   "no chemo" (08/28/2018)   Chronic lower back pain    Clotting disorder ([place])    Diverticulosis    DOE (dyspnea on exertion)    Dysrhythmia    GERD (gastroesophageal reflux disease)    uses levsin once every 2-3 months   Graves' disease    2011   Heart palpitations    History of kidney stones    HLD (hyperlipidemia)    Hx of radiation therapy 08/13/13- 09/06/13   right breast 4250 cGy 17 sessions   Hypertension    controlled   IBS (irritable bowel syndrome)    Lumbar herniated disc    L4-L5   Major depression    MVP (mitral valve prolapse)    Osteopenia    Osteoporosis    zometa infusion 11/2021- is yearly infusion   Personal history of radiation therapy    Pneumonia 08/2019   PONV (postoperative nausea and vomiting)    "when I have general anesthetic" (08/28/2018)   Pulmonary emboli (HCC)    after knee replacement   PVC (premature ventricular contraction)    Takes metoprolol PRN for  pvc's   Tubular adenoma of colon 03/07/2012   Uterine hyperplasia    vaginal [place] every 6 months and had 2  biopsies so far with chnages noted   Past Surgical History:  Procedure Laterality Date   BREAST BIOPSY Right 2014   BREAST EXCISIONAL BIOPSY Right    BREAST LUMPECTOMY WITH NEEDLE LOCALIZATION AND AXILLARY SENTINEL LYMPH NODE BX Right 06/11/2013   Procedure: BREAST LUMPECTOMY WITH NEEDLE LOCALIZATION AND AXILLARY SENTINEL LYMPH NODE BX;  Surgeon: Harl Bowie, MD;  Location: [facility];  Service: General;  Laterality: Right;  Needle localization BCG 7:30   COLONOSCOPY     COLONOSCOPY W/ BIOPSIES AND POLYPECTOMY  multiple   CYSTOSCOPY WITH STENT PLACEMENT Left 08/15/2019   Procedure: CYSTOSCOPY, left retrograde pyleogram  WITH STENT EXCHANGE left and foley placement;  Surgeon: Franchot Gallo, MD;  Location: [facility];  Service: Urology;  Laterality: Left;   IR URETERAL STENT LEFT NEW ACCESS W/O SEP NEPHROSTOMY CATH  08/12/2019   IR URETERAL STENT LEFT NEW ACCESS W/O SEP NEPHROSTOMY CATH  12/09/2021   KNEE ARTHROSCOPY Left 2006   NEPHROLITHOTOMY Left 08/12/2019   Procedure: NEPHROLITHOTOMY PERCUTANEOUS/INSERTION DOUBLE J STENT;  Surgeon: Franchot Gallo, MD;  Location: [facility];  Service: Urology;  Laterality: Left;  2 HRS   NEPHROLITHOTOMY Left 12/09/2021   Procedure: NEPHROLITHOTOMY  PERCUTANEOUS;  Surgeon: Franchot Gallo, MD;  Location: [facility];  Service: Urology;  Laterality: Left;  2 HRS   POLYPECTOMY     TOTAL KNEE ARTHROPLASTY Right 08/28/2018   Procedure: RIGHT TOTAL KNEE ARTHROPLASTY;  Surgeon: Garald Balding, MD;  Location: [facility];  Service: Orthopedics;  Laterality: Right;   uterine biopsy     x 2 with no sedation   Patient Active Problem List   Diagnosis Date Noted   Closed fracture of right distal radius 07/12/2022   Calculus of kidney 12/09/2021   Osteoporosis 12/07/2021   SOB (shortness of breath) 04/07/2020   Nonrheumatic mitral valve regurgitation 04/07/2020   Palpitations 04/07/2020   Educated about COVID-19 virus infection 04/07/2020   Pain in right knee  02/20/2020   Kidney stone 08/12/2019   Hypothyroidism 10/03/2018   PE (pulmonary thromboembolism) ([place]) 10/03/2018   Chronic diastolic CHF (congestive heart failure) ([place]) 10/03/2018   Macrocytic anemia 10/03/2018   Unilateral primary osteoarthritis, right knee 08/28/2018   History of total right knee replacement 08/28/2018   Bilateral primary osteoarthritis of knee 03/12/2018   Essential hypertension 08/26/2016   Mitral valve disorder 08/26/2016   History of palpitations 08/26/2016   Dyslipidemia 08/26/2016   Internal hemorrhoids 08/03/2015   Hx of radiation therapy    Breast cancer of upper-outer quadrant of right female breast ([place]) 05/23/2013   Personal history of colonic polyps 01/06/2012   Irritable bowel syndrome - constipation predominant 01/06/2012    ONSET DATE: DOI 06/02/22  REFERRING DIAG:  S52.501A (ICD-10-CM) - Closed fracture of distal end of right radius, unspecified fracture morphology, initial encounter  M25.531 (ICD-10-CM) - Pain in right wrist    THERAPY DIAG:  Pain in right wrist  Stiffness of right hand, not elsewhere classified  Localized edema  Muscle weakness (generalized)  Other lack of coordination  Stiffness of right wrist, not elsewhere classified  Rationale for Evaluation and Treatment Rehabilitation  SUBJECTIVE:   SUBJECTIVE STATEMENT: She states she was playing pickle ball and fell while walking backward. She states being "in denial," not seeking care for weeks and trying to self-treat.  She eventually was seen and got pre-fab wrist immobilizer, but is not wearing it now. She also states trying to pick up "heavy" objects, weight bearing, etc. She has apparent poor injury awareness and coping/safety skills. She has also problems with I/ADLs, FMS, GMS home tasks, and pain.    PERTINENT HISTORY: 6 weeks since fall and injury now, AROM ok as tolerated now, AA/PROM at 7 weeks and wrist strength at 9 weeks as tolerated.   WEIGHT BEARING  RESTRICTIONS Yes extreme caution right hand/wrist <5# recommended for now  PAIN:  Are you having pain? Yes Rating: 3/10 pain at rest now, up to 8/10 at worst in past week   FALLS: Has patient fallen in last 6 months? Yes. Number of falls 1 (this accident)   LIVING ENVIRONMENT: Lives with: lives with their spouse   PLOF: Independent  PATIENT GOALS To have less pain and get back to ADLs and Pilates  OBJECTIVE:   HAND DOMINANCE: Right   ADLs: Overall ADLs: States decreased ability to grab, hold household objects, pain and inability to open containers, perform all FMS tasks, etc.    FUNCTIONAL OUTCOME MEASURES: Eval: Patient Specific Functional Scale: 2 (lift purse, hold hymnal, Pilates)  UPPER EXTREMITY ROM     Active ROM Right eval Left eval  Wrist flexion 30 67  Wrist extension 49 68  Wrist ulnar deviation 23  Wrist radial deviation 7   Wrist pronation 79   Wrist supination 78   (Blank rows = not tested)  Eval: Full fist b/l today  Active ROM Right eval Left eval  Thumb MCP (0-60) 55 58  Thumb IP (0-80) 58 63  Thumb Radial abd/add (0-55)     Thumb Palmar abd/add (0-45)     Thumb Opposition to Small Finger full    (Blank rows = not tested)   UPPER EXTREMITY MMT:    Eval: TBD, NT due to pain and recent fx, but at least 3/5 MMT to 4-/5 MMT in right arm grossly, based on observations  MMT Right TBD  Shoulder flexion   Shoulder abduction   Shoulder adduction   Shoulder extension   Shoulder internal rotation   Shoulder external rotation   Middle trapezius   Lower trapezius   Elbow flexion   Elbow extension   Wrist flexion   Wrist extension   Wrist ulnar deviation   Wrist radial deviation   Wrist pronation   Wrist supination   (Blank rows = not tested)  HAND FUNCTION: Eval: Grip strength done very cautiously for right hand today and she is encouraged to stop if at all painful. Right: 8 lbs, Left: 30 lbs   COORDINATION: Eval: overtly decreased  FMS, GMS due to pain and limited motion, details TBD. Box and Blocks Test: TBD Blocks today (TBD is [facility]); 9 Hole Peg Test Right: TBDsec  SENSATION: Eval:  Light touch intact today  EDEMA:   Eval:  Mildly swollen in right hand and wrist today  COGNITION: Overall cognitive status: WFL for evaluation today   OBSERVATIONS:   Eval: She has been pushing herself beyond norms for a broken wrist and isn't wearing pre-fab brace consistently or limiting weight bearing, so she has been having some significant pain.  However, fx is not very severe and there are initial signs of healing, so OT may move her faster that typical protocols, but always within her tolerance.  Right thumb CMC J tender as well as around dorsum and volar central wrist (DRUJ) area.    TODAY'S TREATMENT:  Eval: OT starts with body structures/functions education, as well as safety and self-care education for preventing pain, prevent disrupting healing, etc.  She was edu for no lifting <5# recommended for now (or anything sharply painful), also edu for initial AROM exercises as below to help with wrist and thumb OA issues. She demo's them back well with only very mild pain. She is encouraged to use heat 5 mins to start and ice at end 10 mins if sore. She states understanding.   Exercises - Palm Up / Palm Down  - 3-4 x daily - 5-10 reps - Wrist Pronation Stretch  - 3-4 x daily - 3-5 reps - 10-15 sec hold - Forearm Supination Stretch  - 3-4 x daily - 3-5 reps - 15 sec hold - Seated Wrist Flexion Stretch  - 3-4 x daily - 3 reps - 15 hold - Wrist Extension Stretch Pronated  - 3-4 x daily - 3-5 reps - 15 hold - Stretch thumb downward   - 2-3 x daily - 3-5 reps - 15 sec hold - Stretch Thumb into "C-Shape" (don't use other thumb)  - 2-3 x daily - 3-5 reps - 15 sec hold - Towel Roll Grip with Forearm in Neutral  - 2-3 x daily - 3-5 reps - 10 sec hold   PATIENT EDUCATION: Education details: See tx section above for details  Person  educated: Patient Education method: Veterinary surgeon, Teach back, Handouts  Education comprehension: States and demonstrates understanding, Additional Education required    HOME EXERCISE PROGRAM: Access Code: [email or web address] URL: [email or web address] Date: 07/15/2022 Prepared by: Benito Mccreedy   GOALS: Goals reviewed with patient? Yes   SHORT TERM GOALS: (STG required if POC>30 days)  Pt will obtain protective, custom orthotic. Target date: TBD, PRN Goal status: INITIAL  2.  Pt will demo/state understanding of initial HEP to improve pain levels and prerequisite motion. Target date: 07/22/22 Goal status: INITIAL   LONG TERM GOALS:  Pt will improve functional ability by decreased impairment per PSFS assessment from 2 to 7.5 or better, for better quality of life. Target date: 08/26/22 Goal status: INITIAL  2.  Pt will improve grip strength in right dom hand from 8lbs to at least 25lbs for functional use at home and in IADLs. Target date: 08/26/22 Goal status: INITIAL  3.  Pt will improve A/ROM in right wrist flex/ext from 30/49, respectively to at least 60* each, to have functional motion for tasks like reach and grasp.   Target date: 08/26/22 Goal status: INITIAL  4.  Pt will improve strength in right wrist flex, ext to at least 4+/5 MMT to have increased functional ability to carry out selfcare and higher-level homecare tasks with no difficulty. Target date: 08/26/22 Goal status: INITIAL  5.  Pt will decrease pain at worst from 8/10 to 2/10 or better to have better sleep and occupational participation in daily roles. Target date: 08/26/22 Goal status: INITIAL    ASSESSMENT:  CLINICAL IMPRESSION: Patient is a 76 y.o. female who was seen today for occupational therapy evaluation for broken right wrist, decrease knowledge/awareness of coping, and decreased functional ability. She will benefit from OP OT.   PERFORMANCE DEFICITS in functional skills including  ADLs, IADLs, coordination, dexterity, edema, ROM, strength, pain, fascial restrictions, flexibility, GMC, body mechanics, endurance, decreased knowledge of precautions, and UE functional use, cognitive skills including problem solving and safety awareness, and psychosocial skills including coping strategies, environmental adaptation, habits, and routines and behaviors.   IMPAIRMENTS are limiting patient from ADLs, IADLs, rest and sleep, leisure, and social participation.   COMORBIDITIES has co-morbidities such as diastolic CHF, HTN, IBS, hx of PE, OA, SOB, hx of breast CA, and others  that affects occupational performance. Patient will benefit from skilled OT to address above impairments and improve overall function.  MODIFICATION OR ASSISTANCE TO COMPLETE EVALUATION: No modification of tasks or assist necessary to complete an evaluation.  OT OCCUPATIONAL PROFILE AND HISTORY: Problem focused assessment: Including review of records relating to presenting problem.  CLINICAL DECISION MAKING: LOW - limited treatment options, no task modification necessary  REHAB POTENTIAL: Excellent  EVALUATION COMPLEXITY: Low      PLAN: OT FREQUENCY: 1-2x/week  OT DURATION: 6 weeks (through 08/26/22)  PLANNED INTERVENTIONS: self care/ADL training, therapeutic exercise, therapeutic activity, neuromuscular re-education, manual therapy, passive range of motion, splinting, ultrasound, compression bandaging, moist heat, cryotherapy, contrast bath, patient/family education, and coping strategies training  RECOMMENDED OTHER SERVICES: none now   CONSULTED AND AGREED WITH PLAN OF CARE: Patient  PLAN FOR NEXT SESSION: check compliance for initial recommendations and HEP, upgrade per protocols and as tolerated.    Benito Mccreedy, OTR/L, CHT 07/15/2022, 12:58 PM

## 2022-07-22 ENCOUNTER — Encounter: Payer: Medicare Other | Admitting: Rehabilitative and Restorative Service Providers"

## 2022-07-29 ENCOUNTER — Encounter: Payer: Self-pay | Admitting: Rehabilitative and Restorative Service Providers"

## 2022-07-29 ENCOUNTER — Ambulatory Visit: Payer: Medicare Other | Admitting: Rehabilitative and Restorative Service Providers"

## 2022-07-29 DIAGNOSIS — R600 Localized edema: Secondary | ICD-10-CM

## 2022-07-29 DIAGNOSIS — M25641 Stiffness of right hand, not elsewhere classified: Secondary | ICD-10-CM

## 2022-07-29 DIAGNOSIS — M25531 Pain in right wrist: Secondary | ICD-10-CM | POA: Diagnosis not present

## 2022-07-29 DIAGNOSIS — M25631 Stiffness of right wrist, not elsewhere classified: Secondary | ICD-10-CM

## 2022-07-29 DIAGNOSIS — M6281 Muscle weakness (generalized): Secondary | ICD-10-CM | POA: Diagnosis not present

## 2022-07-29 DIAGNOSIS — R278 Other lack of coordination: Secondary | ICD-10-CM

## 2022-07-29 NOTE — Therapy (Signed)
OUTPATIENT OCCUPATIONAL THERAPY TREATMENT NOTE  Patient Name: [name] MRN: [id number] DOB:Feb 28, 1946, 76 y.o., female Today's Date: 07/29/2022  PCP: Dr. Ardean Larsen REFERRING PROVIDER: Persons, [place], [place]   OT End of Session - 07/29/22 1021     Visit Number 2    Number of Visits 10    Date for OT Re-Evaluation 08/26/22    Authorization Type [employer] Medicare    Progress Note Due on Visit 10    OT Start Time 1021    OT Stop Time 1105    OT Time Calculation (min) 44 min    Activity Tolerance Patient tolerated treatment well;No increased pain;Patient limited by pain;Patient limited by fatigue    Behavior During Therapy [facility] for tasks assessed/performed              Past Medical History:  Diagnosis Date   Allergic rhinitis    Allergy    Arthritis    "knees" (08/28/2018)   Breast cancer, right breast (HCC) 05/20/2013   "no chemo" (08/28/2018)   Chronic lower back pain    Clotting disorder (HCC)    Diverticulosis    DOE (dyspnea on exertion)    Dysrhythmia    GERD (gastroesophageal reflux disease)    uses levsin once every 2-3 months   Graves' disease    2011   Heart palpitations    History of kidney stones    HLD (hyperlipidemia)    Hx of radiation therapy 08/13/13- 09/06/13   right breast 4250 cGy 17 sessions   Hypertension    controlled   IBS (irritable bowel syndrome)    Lumbar herniated disc    L4-L5   Major depression    MVP (mitral valve prolapse)    Osteopenia    Osteoporosis    zometa infusion 11/2021- is yearly infusion   Personal history of radiation therapy    Pneumonia 08/2019   PONV (postoperative nausea and vomiting)    "when I have general anesthetic" (08/28/2018)   Pulmonary emboli (HCC)    after knee replacement   PVC (premature ventricular contraction)    Takes metoprolol PRN for  pvc's   Tubular adenoma of colon 03/07/2012   Uterine hyperplasia    vaginal [place] every 6 months and had 2 biopsies so far with chnages noted   Past  Surgical History:  Procedure Laterality Date   BREAST BIOPSY Right 2014   BREAST EXCISIONAL BIOPSY Right    BREAST LUMPECTOMY WITH NEEDLE LOCALIZATION AND AXILLARY SENTINEL LYMPH NODE BX Right 06/11/2013   Procedure: BREAST LUMPECTOMY WITH NEEDLE LOCALIZATION AND AXILLARY SENTINEL LYMPH NODE BX;  Surgeon: Shelly Rubenstein, MD;  Location: [facility];  Service: General;  Laterality: Right;  Needle localization BCG 7:30   COLONOSCOPY     COLONOSCOPY W/ BIOPSIES AND POLYPECTOMY  multiple   CYSTOSCOPY WITH STENT PLACEMENT Left 08/15/2019   Procedure: CYSTOSCOPY, left retrograde pyleogram  WITH STENT EXCHANGE left and foley placement;  Surgeon: Marcine Matar, MD;  Location: [facility];  Service: Urology;  Laterality: Left;   IR URETERAL STENT LEFT NEW ACCESS W/O SEP NEPHROSTOMY CATH  08/12/2019   IR URETERAL STENT LEFT NEW ACCESS W/O SEP NEPHROSTOMY CATH  12/09/2021   KNEE ARTHROSCOPY Left 2006   NEPHROLITHOTOMY Left 08/12/2019   Procedure: NEPHROLITHOTOMY PERCUTANEOUS/INSERTION DOUBLE J STENT;  Surgeon: Marcine Matar, MD;  [facility];  Service: Urology;  Laterality: Left;  2 HRS   NEPHROLITHOTOMY Left 12/09/2021   Procedure: NEPHROLITHOTOMY PERCUTANEOUS;  Surgeon: Marcine Matar, MD;  [facility];  Service: Urology;  Laterality: Left;  2 HRS   POLYPECTOMY     TOTAL KNEE ARTHROPLASTY Right 08/28/2018   Procedure: RIGHT TOTAL KNEE ARTHROPLASTY;  Surgeon: Valeria Batman, MD;  Location: [facility];  Service: Orthopedics;  Laterality: Right;   uterine biopsy     x 2 with no sedation   Patient Active Problem List   Diagnosis Date Noted   Closed fracture of right distal radius 07/12/2022   Calculus of kidney 12/09/2021   Osteoporosis 12/07/2021   SOB (shortness of breath) 04/07/2020   Nonrheumatic mitral valve regurgitation 04/07/2020   Palpitations 04/07/2020   Educated about COVID-19 virus infection 04/07/2020   Pain in right knee 02/20/2020   Kidney stone 08/12/2019    Hypothyroidism 10/03/2018   PE (pulmonary thromboembolism) (HCC) 10/03/2018   Chronic diastolic CHF (congestive heart failure) (HCC) 10/03/2018   Macrocytic anemia 10/03/2018   Unilateral primary osteoarthritis, right knee 08/28/2018   History of total right knee replacement 08/28/2018   Bilateral primary osteoarthritis of knee 03/12/2018   Essential hypertension 08/26/2016   Mitral valve disorder 08/26/2016   History of palpitations 08/26/2016   Dyslipidemia 08/26/2016   Internal hemorrhoids 08/03/2015   Hx of radiation therapy    Breast cancer of upper-outer quadrant of right female breast (HCC) 05/23/2013   Personal history of colonic polyps 01/06/2012   Irritable bowel syndrome - constipation predominant 01/06/2012    ONSET DATE: DOI 06/02/22  REFERRING DIAG:  S52.501A (ICD-10-CM) - Closed fracture of distal end of right radius, unspecified fracture morphology, initial encounter  M25.531 (ICD-10-CM) - Pain in right wrist    THERAPY DIAG:  Pain in right wrist  Stiffness of right hand, not elsewhere classified  Localized edema  Muscle weakness (generalized)  Other lack of coordination  Stiffness of right wrist, not elsewhere classified  Rationale for Evaluation and Treatment Rehabilitation  PERTINENT HISTORY: 8 weeks since fall and injury now, AROM ok as tolerated now, AA/PROM at 7 weeks and wrist strength at 9 weeks as tolerated.   WEIGHT BEARING RESTRICTIONS Yes extreme caution right hand/wrist <5# recommended for now   SUBJECTIVE:   SUBJECTIVE STATEMENT: She states doing much better and she has started fading away HEP because she's feeling "back to herself" a bit.    PAIN:  Are you having pain? No Rating: 0/10 pain at rest now, up to 8/10 at worst in past week very infrequently   PATIENT GOALS To have less pain and get back to ADLs and Pilates   OBJECTIVE: (All objective assessments below are from initial evaluation on: 07/15/22 unless otherwise  specified.)    HAND DOMINANCE: Right   ADLs: Overall ADLs: States decreased ability to grab, hold household objects, pain and inability to open containers, perform all FMS tasks, etc.    FUNCTIONAL OUTCOME MEASURES: Eval: Patient Specific Functional Scale: 2 (lift purse, hold hymnal, Pilates)  UPPER EXTREMITY ROM     Active ROM Right eval Left eval Right 07/29/22  Wrist flexion 30 67 35  Wrist extension 49 68 55  Wrist ulnar deviation 23    Wrist radial deviation 7    Wrist pronation 79    Wrist supination 78    (Blank rows = not tested)  Eval: Full fist b/l today  Active ROM Right eval Left eval  Thumb MCP (0-60) 55 58  Thumb IP (0-80) 58 63  Thumb Radial abd/add (0-55)     Thumb Palmar abd/add (0-45)     Thumb Opposition  to Small Finger full    (Blank rows = not tested)   UPPER EXTREMITY MMT:    Eval: TBD, NT due to pain and recent fx, but at least 3/5 MMT to 4-/5 MMT in right arm grossly, based on observations  MMT Right TBD  Shoulder flexion   Shoulder abduction   Shoulder adduction   Shoulder extension   Shoulder internal rotation   Shoulder external rotation   Middle trapezius   Lower trapezius   Elbow flexion   Elbow extension   Wrist flexion   Wrist extension   Wrist ulnar deviation   Wrist radial deviation   Wrist pronation   Wrist supination   (Blank rows = not tested)  HAND FUNCTION: 07/29/22: 9.1# right grip today  Eval: Grip strength done very cautiously for right hand today and she is encouraged to stop if at all painful. Right: 8 lbs, Left: 30 lbs   COORDINATION: Eval: overtly decreased FMS, GMS due to pain and limited motion  SENSATION: Eval:  Light touch intact today  EDEMA:   Eval:  Mildly swollen in right hand and wrist today   OBSERVATIONS:   Eval: She has been pushing herself beyond norms for a broken wrist and isn't wearing pre-fab brace consistently or limiting weight bearing, so she has been having some significant pain.   However, fx is not very severe and there are initial signs of healing, so OT may move her faster that typical protocols, but always within her tolerance.  Right thumb CMC J tender as well as around dorsum and volar central wrist (DRUJ) area.    TODAY'S TREATMENT:  07/29/22: She starts with AROM for new measures, then reviews HEP for stretches at wrist, thumb and gentle towel gripping.  OT tries to add new wrist strength with hammer but this is painful to her. It seems that she stopped stretches at home too soon. She was highly encouraged to keep stretches 2-3 x every day. T-band exercises for wrist were tolerated better (as below).   Exercises - Forearm Supination Stretch  - 2-4 x daily - 3-5 reps - 15 sec hold - Seated Wrist Flexion Stretch  - 3-4 x daily - 3 reps - 15 hold - Wrist Prayer Stretch  - 3-4 x daily - 3-5 reps - 15 sec hold - Stretch Thumb Down (DON'T do like the picture)   - 2-3 x daily - 3-5 reps - 15 sec hold - Stretch C-Web Space (DON'T DO LIKE PICTURE)  - 2-3 x daily - 3-5 reps - 15 sec hold - Towel Roll Grip with Forearm in Neutral  - 2-3 x daily - 3-5 reps - 10 sec hold - Resisted Finger Abduction - Index and Middle  - 2-3 x daily - 5-10 reps - 2-3 sec hold - C-Strength (try using rubber band)   - 2-3 x daily - 5-10 reps - 2-3 sec hold - Hammer Stretch or Strength   - 2-4 x daily - 1-2 sets - 10-15 reps - Wrist Extension with Resistance  - 2-4 x daily - 1-2 sets - 10-15 reps - Wrist Flexion with Resistance  - 2-4 x daily - 1-2 sets - 10-15 reps   PATIENT EDUCATION: Education details: See tx section above for details  Person educated: Patient Education method: Engineer, structural, Teach back, Handouts  Education comprehension: States and demonstrates understanding, Additional Education required    HOME EXERCISE PROGRAM: Access Code: [email or web address] URL: [email or web address]   GOALS: Goals reviewed with patient? Yes  SHORT TERM GOALS: (STG required if  POC>30 days)  Pt will obtain protective, custom orthotic. Target date: TBD, PRN Goal status: INITIAL  2.  Pt will demo/state understanding of initial HEP to improve pain levels and prerequisite motion. Target date: 07/22/22 Goal status: 07/29/22: almost all HEP has been taught now.    LONG TERM GOALS:  Pt will improve functional ability by decreased impairment per PSFS assessment from 2 to 7.5 or better, for better quality of life. Target date: 08/26/22 Goal status: INITIAL  2.  Pt will improve grip strength in right dom hand from 8lbs to at least 25lbs for functional use at home and in IADLs. Target date: 08/26/22 Goal status: INITIAL  3.  Pt will improve A/ROM in right wrist flex/ext from 30/49, respectively to at least 60* each, to have functional motion for tasks like reach and grasp.   Target date: 08/26/22 Goal status: INITIAL  4.  Pt will improve strength in right wrist flex, ext to at least 4+/5 MMT to have increased functional ability to carry out selfcare and higher-level homecare tasks with no difficulty. Target date: 08/26/22 Goal status: INITIAL  5.  Pt will decrease pain at worst from 8/10 to 2/10 or better to have better sleep and occupational participation in daily roles. Target date: 08/26/22 Goal status: INITIAL    ASSESSMENT:  CLINICAL IMPRESSION: 07/29/22: She is doing better, but stopped HEP in last week as her pain dropped. Her wrist is still a bit stiff due to this and not tolerating all forms of strengthening yet. OT encourages her to continue HEP consistently, wear brace consistently.   Eval: Patient is a 75 y.o. female who was seen today for occupational therapy evaluation for broken right wrist, decrease knowledge/awareness of coping, and decreased functional ability. She will benefit from OP OT.   PLAN: OT FREQUENCY: 1-2x/week  OT DURATION: 6 weeks (through 08/26/22)  PLANNED INTERVENTIONS: self care/ADL training, therapeutic exercise, therapeutic  activity, neuromuscular re-education, manual therapy, passive range of motion, splinting, ultrasound, compression bandaging, moist heat, cryotherapy, contrast bath, patient/family education, and coping strategies training  RECOMMENDED OTHER SERVICES: none now   CONSULTED AND AGREED WITH PLAN OF CARE: Patient  PLAN FOR NEXT SESSION:  Add hand strength with therapy putty as tol and check about tolerance to stretches and hammer strength. Also do FMS as needed    Fannie Knee, OTR/L, CHT 07/29/2022, 12:12 PM

## 2022-07-31 NOTE — Progress Notes (Unsigned)
Cardiology Office Note   Date:  08/02/2022   ID:  [name], DOB [birth year], MRN [id number]  PCP:  Michelle Jewel, MD  Cardiologist:   Minus Breeding, MD  Chief Complaint  Patient presents with   Palpitations       History of Present Illness: [name] is a 75 y.o. female who I saw previously for evaluation of palpitations she had diagonal stenosis when noted on a CT that was done in [place] when she was living there.  She had a normal echo . I saw her many years ago and she was having palpitations. She does have mitral valve prolapse. However, nothing significant was noted and she didn't have any problems until she developed Graves' disease 2011. She again had palpitations. She was living in [place] and she had a workup by a cardiologist there. Coronary CT demonstrated some diagonal stenosis but she doesn't know of any other disease. She had a treadmill test which apparently was negative.   She had a normal echo in 2019.  She had a POET (Plain Old Exercise Treadmill) which was negative in 2020.  She had a PE after knee replacement.    Since I last saw her echo showed last fall that her MR was mild.  Since I last saw her she has done well.  She is not having any palpitations that she was having previously. The patient denies any new symptoms such as chest discomfort, neck or arm discomfort. There has been no new shortness of breath, PND or orthopnea. There have been no reported palpitations, presyncope or syncope.   Past Medical History:  Diagnosis Date   Allergic rhinitis    Allergy    Arthritis    "knees" (08/28/2018)   Breast cancer, right breast ([place]) 05/20/2013   "no chemo" (08/28/2018)   Chronic lower back pain    Clotting disorder ([place])    Diverticulosis    DOE (dyspnea on exertion)    Dysrhythmia    GERD (gastroesophageal reflux disease)    uses levsin once every 2-3 months   Graves' disease    2011   Heart palpitations    History of kidney  stones    HLD (hyperlipidemia)    Hx of radiation therapy 08/13/13- 09/06/13   right breast 4250 cGy 17 sessions   Hypertension    controlled   IBS (irritable bowel syndrome)    Lumbar herniated disc    L4-L5   Major depression    MVP (mitral valve prolapse)    Osteopenia    Osteoporosis    zometa infusion 11/2021- is yearly infusion   Personal history of radiation therapy    Pneumonia 08/2019   PONV (postoperative nausea and vomiting)    "when I have general anesthetic" (08/28/2018)   Pulmonary emboli (HCC)    after knee replacement   PVC (premature ventricular contraction)    Takes metoprolol PRN for  pvc's   Tubular adenoma of colon 03/07/2012   Uterine hyperplasia    vaginal [place] every 6 months and had 2 biopsies so far with chnages noted    Past Surgical History:  Procedure Laterality Date   BREAST BIOPSY Right 2014   BREAST EXCISIONAL BIOPSY Right    BREAST LUMPECTOMY WITH NEEDLE LOCALIZATION AND AXILLARY SENTINEL LYMPH NODE BX Right 06/11/2013   Procedure: BREAST LUMPECTOMY WITH NEEDLE LOCALIZATION AND AXILLARY SENTINEL LYMPH NODE BX;  Surgeon: Harl Bowie, MD;  Location: [facility];  Service: General;  Laterality: Right;  Needle localization BCG 7:30   COLONOSCOPY     COLONOSCOPY W/ BIOPSIES AND POLYPECTOMY  multiple   CYSTOSCOPY WITH STENT PLACEMENT Left 08/15/2019   Procedure: CYSTOSCOPY, left retrograde pyleogram  WITH STENT EXCHANGE left and foley placement;  Surgeon: Franchot Gallo, MD;  [facility];  Service: Urology;  Laterality: Left;   IR URETERAL STENT LEFT NEW ACCESS W/O SEP NEPHROSTOMY CATH  08/12/2019   IR URETERAL STENT LEFT NEW ACCESS W/O SEP NEPHROSTOMY CATH  12/09/2021   KNEE ARTHROSCOPY Left 2006   NEPHROLITHOTOMY Left 08/12/2019   Procedure: NEPHROLITHOTOMY PERCUTANEOUS/INSERTION DOUBLE J STENT;  Surgeon: Franchot Gallo, MD;  [facility];  Service: Urology;  Laterality: Left;  2 HRS   NEPHROLITHOTOMY Left 12/09/2021   Procedure:  NEPHROLITHOTOMY PERCUTANEOUS;  Surgeon: Franchot Gallo, MD;  [facility];  Service: Urology;  Laterality: Left;  2 HRS   POLYPECTOMY     TOTAL KNEE ARTHROPLASTY Right 08/28/2018   Procedure: RIGHT TOTAL KNEE ARTHROPLASTY;  Surgeon: Garald Balding, MD;  Location: [facility];  Service: Orthopedics;  Laterality: Right;   uterine biopsy     x 2 with no sedation     Current Outpatient Medications  Medication Sig Dispense Refill   buPROPion (WELLBUTRIN XL) 300 MG 24 hr tablet 1 tablet in the morning Orally Once a day 30 tablet 0   Calcium Carb-Cholecalciferol (CALCIUM 600 + D PO) Take 1 tablet by mouth at bedtime.     ECHINACEA PO Take 1,300 mg by mouth daily as needed (immune support).     fluticasone (FLONASE) 50 MCG/ACT nasal spray Place 2 sprays into the nose daily.      hyoscyamine (LEVSIN SL) 0.125 MG SL tablet Take 1 tablet by mouth every 6-8 hours as needed for abdominal pain (Patient taking differently: Take 0.125 mg by mouth every 6 (six) hours as needed (heartburn).) 60 tablet 2   ibuprofen (ADVIL) 200 MG tablet Take 200-400 mg by mouth every 8 (eight) hours as needed (pain.).     Multiple Vitamin (MULTIVITAMIN WITH MINERALS) TABS tablet Take 1 tablet by mouth daily.     polyethylene glycol (MIRALAX / GLYCOLAX) 17 g packet Take 17 g by mouth daily as needed for moderate constipation.     Probiotic Product (PROBIOTIC DAILY PO) Take 1 capsule by mouth daily.     senna (SENOKOT) 8.6 MG tablet Take 1 tablet by mouth daily as needed for constipation.     Sod Fluoride-Potassium Nitrate (PREVIDENT 5000 ENAMEL PROTECT) 1.1-5 % GEL Place 1 application onto teeth daily.     SYNTHROID 75 MCG tablet TAKE ONE TABLET EVERY MORNING ON AN EMPTY STOMACH 30 tablet 3   Turmeric (QC TUMERIC COMPLEX PO) Take 1,500 mg by mouth daily.     zolpidem (AMBIEN) 10 MG tablet Take 2.5 mg by mouth at bedtime as needed for sleep.      atorvastatin (LIPITOR) 20 MG tablet Take 1 tablet (20 mg total) by mouth at  bedtime. 90 tablet 3   metoprolol tartrate (LOPRESSOR) 25 MG tablet Take 0.5 tablets (12.5 mg total) by mouth 2 (two) times daily. 90 tablet 3   No current facility-administered medications for this visit.    Allergies:   Dilaudid [hydromorphone hcl], Terak [terramycin], Oxybutynin, Amoxicillin, and Erythromycin    ROS:  Please see the history of present illness.   Otherwise, review of systems are positive for none.   All other systems are reviewed and negative.    PHYSICAL EXAM:  VS:  BP 128/64   Pulse (!) 43   Ht '5\' 5"'$  (1.651 m)   Wt 132 lb 3.2 oz (60 kg)   SpO2 99%   BMI 22.00 kg/m  , BMI Body mass index is 22 kg/m. GENERAL:  Well appearing NECK:  No jugular venous distention, waveform within normal limits, carotid upstroke brisk and symmetric, no bruits, no thyromegaly LUNGS:  Clear to auscultation bilaterally CHEST:  Unremarkable HEART:  PMI not displaced or sustained,S1 and S2 within normal limits, no S3, no S4, no clicks, no rubs, brief 2 out of 6 apical and left upper sternal border murmur, no diastolic murmurs ABD:  Flat, positive bowel sounds normal in frequency in pitch, no bruits, no rebound, no guarding, no midline pulsatile mass, no hepatomegaly, no splenomegaly EXT:  2 plus pulses throughout, no edema, no cyanosis no clubbing  EKG:  EKG is  ordered today. The ekg ordered today demonstrates sinus rhythm, rate 46, axis within normal limits.  Intervals within normal limits, no acute ST-T wave changes.   Recent Labs: 12/06/2021: BUN 18; Creatinine, Ser 0.88; Platelets 214; Potassium 4.2; Sodium 137 12/10/2021: Hemoglobin 13.0    Lipid Panel No results found for: "CHOL", "TRIG", "HDL", "CHOLHDL", "VLDL", "LDLCALC", "LDLDIRECT"    Wt Readings from Last 3 Encounters:  08/02/22 132 lb 3.2 oz (60 kg)  04/19/22 133 lb (60.3 kg)  03/30/22 133 lb (60.3 kg)      Other studies Reviewed: Additional studies/ records that were reviewed today include: None. Review of the  above records demonstrates:  NA   ASSESSMENT AND PLAN:  CAD The patient has no new sypmtoms.  No further cardiovascular testing is indicated.  We will continue with aggressive risk reduction and meds as listed.  Mitral valve disorder This was mild in Nov 2022.  I will follow this clinically.  No reason for echo at this point.   Dyslipidemia  I think the goal should be an LDL less than 70 we will increase her Lipitor to 20 mg.  She wants to get her labs checked with her primary provider.     Current medicines are reviewed at length with the patient today.  The patient does not have concerns regarding medicines.  The following changes have been made: As above  Labs/ tests ordered today include: None  Orders Placed This Encounter  Procedures   EKG 12-Lead      Disposition:   FU with me in 12 months.Ronnell Guadalajara, MD  08/02/2022 5:18 PM    [facility]

## 2022-08-02 ENCOUNTER — Ambulatory Visit: Payer: Medicare Other | Attending: Cardiology | Admitting: Cardiology

## 2022-08-02 ENCOUNTER — Encounter: Payer: Self-pay | Admitting: Cardiology

## 2022-08-02 VITALS — BP 128/64 | HR 43 | Ht 65.0 in | Wt 132.2 lb

## 2022-08-02 DIAGNOSIS — I059 Rheumatic mitral valve disease, unspecified: Secondary | ICD-10-CM

## 2022-08-02 DIAGNOSIS — R002 Palpitations: Secondary | ICD-10-CM | POA: Diagnosis not present

## 2022-08-02 MED ORDER — METOPROLOL TARTRATE 25 MG PO TABS
12.5000 mg | ORAL_TABLET | Freq: Two times a day (BID) | ORAL | 3 refills | Status: AC
Start: 2022-08-02 — End: ?

## 2022-08-02 MED ORDER — ATORVASTATIN CALCIUM 20 MG PO TABS: 20.0000 mg | ORAL_TABLET | Freq: Every day | ORAL | 3 refills | Status: DC

## 2022-08-02 NOTE — Patient Instructions (Signed)
Medication Instructions:   INCREASE ATORVASTATIN TO 20 MG ONCE DAILY= 2 OF THE 10 MG TABLETS ONCE DAILY  *If you need a refill on your cardiac medications before your next appointment, please call your pharmacy*   Follow-Up: At [facility], you and your health needs are our priority.  As part of our continuing mission to provide you with exceptional heart care, we have created designated Provider Care Teams.  These Care Teams include your primary Cardiologist (physician) and Advanced Practice Providers (APPs -  Physician Assistants and Nurse Practitioners) who all work together to provide you with the care you need, when you need it.  We recommend signing up for the patient portal called "MyChart".  Sign up information is provided on this After Visit Summary.  MyChart is used to connect with patients for Virtual Visits (Telemedicine).  Patients are able to view lab/test results, encounter notes, upcoming appointments, etc.  Non-urgent messages can be sent to your provider as well.   To learn more about what you can do with MyChart, go to [email or web address].    Your next appointment:   12 month(s)  The format for your next appointment:   In Person  Provider:   Minus Breeding, MD

## 2022-08-11 ENCOUNTER — Ambulatory Visit: Payer: Medicare Other | Admitting: Rehabilitative and Restorative Service Providers"

## 2022-08-11 ENCOUNTER — Other Ambulatory Visit (HOSPITAL_BASED_OUTPATIENT_CLINIC_OR_DEPARTMENT_OTHER): Payer: Self-pay

## 2022-08-11 ENCOUNTER — Encounter: Payer: Self-pay | Admitting: Hematology and Oncology

## 2022-08-11 ENCOUNTER — Encounter: Payer: Self-pay | Admitting: Rehabilitative and Restorative Service Providers"

## 2022-08-11 DIAGNOSIS — R278 Other lack of coordination: Secondary | ICD-10-CM

## 2022-08-11 DIAGNOSIS — M25631 Stiffness of right wrist, not elsewhere classified: Secondary | ICD-10-CM

## 2022-08-11 DIAGNOSIS — M25531 Pain in right wrist: Secondary | ICD-10-CM

## 2022-08-11 DIAGNOSIS — M6281 Muscle weakness (generalized): Secondary | ICD-10-CM

## 2022-08-11 DIAGNOSIS — R600 Localized edema: Secondary | ICD-10-CM

## 2022-08-11 DIAGNOSIS — M25641 Stiffness of right hand, not elsewhere classified: Secondary | ICD-10-CM | POA: Diagnosis not present

## 2022-08-11 MED ORDER — INFLUENZA VAC A&B SA ADJ QUAD 0.5 ML IM PRSY
PREFILLED_SYRINGE | INTRAMUSCULAR | 0 refills | Status: DC
  Filled 2022-08-11: qty 0.5, 1d supply, fill #0

## 2022-08-11 NOTE — Therapy (Signed)
OUTPATIENT OCCUPATIONAL THERAPY TREATMENT NOTE  Patient Name: [name] MRN: [id number] DOB:30-Apr-1946, 76 y.o., female Today's Date: 08/11/2022  PCP: Dr. Early Osmond REFERRING PROVIDER: Persons, Bevely Palmer, [place]   OT End of Session - 08/11/22 1515     Visit Number 3    Number of Visits 10    Date for OT Re-Evaluation 08/26/22    Authorization Type [employer] Medicare    Progress Note Due on Visit 10    OT Start Time 1515    OT Stop Time 1604    OT Time Calculation (min) 49 min    Activity Tolerance Patient tolerated treatment well;No increased pain;Patient limited by fatigue    Behavior During Therapy [facility] for tasks assessed/performed               Past Medical History:  Diagnosis Date   Allergic rhinitis    Allergy    Arthritis    "knees" (08/28/2018)   Breast cancer, right breast ([place]) 05/20/2013   "no chemo" (08/28/2018)   Chronic lower back pain    Clotting disorder ([place])    Diverticulosis    DOE (dyspnea on exertion)    Dysrhythmia    GERD (gastroesophageal reflux disease)    uses levsin once every 2-3 months   Graves' disease    2011   Heart palpitations    History of kidney stones    HLD (hyperlipidemia)    Hx of radiation therapy 08/13/13- 09/06/13   right breast 4250 cGy 17 sessions   Hypertension    controlled   IBS (irritable bowel syndrome)    Lumbar herniated disc    L4-L5   Major depression    MVP (mitral valve prolapse)    Osteopenia    Osteoporosis    zometa infusion 11/2021- is yearly infusion   Personal history of radiation therapy    Pneumonia 08/2019   PONV (postoperative nausea and vomiting)    "when I have general anesthetic" (08/28/2018)   Pulmonary emboli (H[facility])    after knee replacement   PVC (premature ventricular contraction)    Takes metoprolol PRN for  pvc's   Tubular adenoma of colon 03/07/2012   Uterine hyperplasia    vaginal [place] every 6 months and had 2 biopsies so far with chnages noted   Past Surgical History:   Procedure Laterality Date   BREAST BIOPSY Right 2014   BREAST EXCISIONAL BIOPSY Right    BREAST LUMPECTOMY WITH NEEDLE LOCALIZATION AND AXILLARY SENTINEL LYMPH NODE BX Right 06/11/2013   Procedure: BREAST LUMPECTOMY WITH NEEDLE LOCALIZATION AND AXILLARY SENTINEL LYMPH NODE BX;  Surgeon: Harl Bowie, MD;  Location: [facility];  Service: General;  Laterality: Right;  Needle localization BCG 7:30   COLONOSCOPY     COLONOSCOPY W/ BIOPSIES AND POLYPECTOMY  multiple   CYSTOSCOPY WITH STENT PLACEMENT Left 08/15/2019   Procedure: CYSTOSCOPY, left retrograde pyleogram  WITH STENT EXCHANGE left and foley placement;  Surgeon: Franchot Gallo, MD;  Location: [facility];  Service: Urology;  Laterality: Left;   IR URETERAL STENT LEFT NEW ACCESS W/O SEP NEPHROSTOMY CATH  08/12/2019   IR URETERAL STENT LEFT NEW ACCESS W/O SEP NEPHROSTOMY CATH  12/09/2021   KNEE ARTHROSCOPY Left 2006   NEPHROLITHOTOMY Left 08/12/2019   Procedure: NEPHROLITHOTOMY PERCUTANEOUS/INSERTION DOUBLE J STENT;  Surgeon: Franchot Gallo, MD;  Location: [facility];  Service: Urology;  Laterality: Left;  2 HRS   NEPHROLITHOTOMY Left 12/09/2021   Procedure: NEPHROLITHOTOMY PERCUTANEOUS;  Surgeon: Franchot Gallo, MD;  Location: [facility];  Service: Urology;  Laterality: Left;  2 HRS   POLYPECTOMY     TOTAL KNEE ARTHROPLASTY Right 08/28/2018   Procedure: RIGHT TOTAL KNEE ARTHROPLASTY;  Surgeon: Garald Balding, MD;  Location: [facility];  Service: Orthopedics;  Laterality: Right;   uterine biopsy     x 2 with no sedation   Patient Active Problem List   Diagnosis Date Noted   Closed fracture of right distal radius 07/12/2022   Calculus of kidney 12/09/2021   Osteoporosis 12/07/2021   SOB (shortness of breath) 04/07/2020   Nonrheumatic mitral valve regurgitation 04/07/2020   Palpitations 04/07/2020   Educated about COVID-19 virus infection 04/07/2020   Pain in right knee 02/20/2020   Kidney stone 08/12/2019   Hypothyroidism  10/03/2018   PE (pulmonary thromboembolism) ([place]) 10/03/2018   Macrocytic anemia 10/03/2018   Unilateral primary osteoarthritis, right knee 08/28/2018   History of total right knee replacement 08/28/2018   Bilateral primary osteoarthritis of knee 03/12/2018   Essential hypertension 08/26/2016   Mitral valve disorder 08/26/2016   History of palpitations 08/26/2016   Dyslipidemia 08/26/2016   Internal hemorrhoids 08/03/2015   Hx of radiation therapy    Breast cancer of upper-outer quadrant of right female breast ([place]) 05/23/2013   Personal history of colonic polyps 01/06/2012   Irritable bowel syndrome - constipation predominant 01/06/2012    ONSET DATE: DOI 06/02/22  REFERRING DIAG:  S52.501A (ICD-10-CM) - Closed fracture of distal end of right radius, unspecified fracture morphology, initial encounter  M25.531 (ICD-10-CM) - Pain in right wrist    THERAPY DIAG:  Pain in right wrist  Stiffness of right hand, not elsewhere classified  Muscle weakness (generalized)  Localized edema  Other lack of coordination  Stiffness of right wrist, not elsewhere classified  Rationale for Evaluation and Treatment Rehabilitation  PERTINENT HISTORY: 10 weeks since fall and injury now, AROM ok as tolerated now, AA/PROM at 7 weeks and wrist strength at 9 weeks as tolerated.   WEIGHT BEARING RESTRICTIONS WBAT now, caution for heavy/dynamic tasks until 12 weeks post injury    SUBJECTIVE:   SUBJECTIVE STATEMENT: She returns after 2 weeks of self-management, states she has no significant pains in her healing wrist now most of the time. She has returned to most activities, but still has some issues with Pilates and pinching with arthritic thumbs. She states doing HEP but not very consistently.    PAIN:  Are you having pain? No Rating: 0/10 pain at rest now, up to 1-2/10 at worst in past week infrequently   PATIENT GOALS To have less pain and get back to ADLs and Pilates   OBJECTIVE:  (All objective assessments below are from initial evaluation on: 07/15/22 unless otherwise specified.)    HAND DOMINANCE: Right   ADLs: Overall ADLs: 08/11/22: only mild complaints about pinching with thumbs now, but independent with all tasks    FUNCTIONAL OUTCOME MEASURES: 08/11/22: PSFS 4.8 today (rates holding a hymnal a "2" due to it being heavy)  Eval: Patient Specific Functional Scale: 2 (lift purse, hold hymnal, Pilates)  UPPER EXTREMITY ROM     Active ROM Right eval Left eval Right 08/11/22  Wrist flexion 30 67 42  Wrist extension 49 68 57  Wrist ulnar deviation 23  33  Wrist radial deviation 7  16  Wrist pronation 79  79  Wrist supination 78  86  (Blank rows = not tested)  Eval: Full fist b/l today  Active ROM Right eval Left eval  Thumb  MCP (0-60) 55 58  Thumb IP (0-80) 58 63  Thumb Radial abd/add (0-55)     Thumb Palmar abd/add (0-45)     Thumb Opposition to Small Finger full    (Blank rows = not tested)   UPPER EXTREMITY MMT:     MMT Right 08/11/22  Wrist flexion 4/5 tender  Wrist extension 4+/5 no pain  Wrist ulnar deviation 4+/5  Wrist radial deviation 4+/5  Wrist pronation 5/5  Wrist supination 5/5  (Blank rows = not tested)  HAND FUNCTION: 08/11/22: 11# right grip today  Eval: Grip strength done very cautiously for right hand today and she is encouraged to stop if at all painful. Right: 8 lbs, Left: 30 lbs   COORDINATION: 08/11/22: no overt issues or complaints today   OBSERVATIONS:   08/11/22: no instability, no significant TTP today, still some chronic thumb CMC J issues b/l but she has a plan for that    TODAY'S TREATMENT:  08/11/22: She begins with ROM for new measures, discussion and review of HEP including t-band strength for wrist. Pt performs AROM, gripping, and strength with right hand and arm against therapist's resistance for exercise/activities as well as new measures today. She does well with all, but also describes intermittent  "sharp" pain near base of thumb volarly (possibly CMC J or DRUJ). OT performs manual grade 3 joint mobs to radiocarpal joint and midcarpal joints in flexion and extension, and she states feeling no pain, some what better.  OT educates her on how to do this for herself, and she demo's back. OT also supplies her with compressive wrist strap to support her during more difficult activities.   OT also adds putty pinch and grip activities to current HEP to improve strength, stability of thumb and she demo's back with only fatigue.  She has difficulty staying abducted and preventing IP J of thumb from extending while pinching (OA issues).  She is coached on that. OT also reviews home activities with her and reviews goals. She states being pleased with very little pain and getting back to most activities- even Pilates, though modified now. She states feeling that she can likely carry on independently and requests D/C from therapy now.  She can always return with new order in the future if having any issues. She was cautioned to be cautious for next 2 weeks as she returns to normal activities and not to go "cold [place]" off HEP. She states understanding.     PATIENT EDUCATION: Education details: See tx section above for details  Person educated: Patient Education method: Verbal Instruction, Teach back, Handouts  Education comprehension: States and demonstrates understanding, Additional Education required    HOME EXERCISE PROGRAM: Access Code: [email or web address] URL: [email or web address]   GOALS: Goals reviewed with patient? Yes   SHORT TERM GOALS: (STG required if POC>30 days)  Pt will obtain protective, custom orthotic. Target date: TBD, PRN Goal status: INITIAL  2.  Pt will demo/state understanding of initial HEP to improve pain levels and prerequisite motion. Target date: 07/22/22 Goal status: 08/11/22: MET     LONG TERM GOALS:  Pt will improve functional ability by decreased  impairment per PSFS assessment from 2 to 7.5 or better, for better quality of life. Target date: 08/26/22 Goal status: 08/11/22: now rated a 4.8  2.  Pt will improve grip strength in right dom hand from 8lbs to at least 25lbs for functional use at home and in IADLs. Target date: 08/26/22 Goal status: 08/11/22: now 11#  3.  Pt will improve A/ROM in right wrist flex/ext from 30/49, respectively to at least 60* each, to have functional motion for tasks like reach and grasp.  Target date: 08/26/22 Goal status: 08/11/22: now 42/57*   4.  Pt will improve strength in right wrist flex, ext to at least 4+/5 MMT to have increased functional ability to carry out selfcare and higher-level homecare tasks with no difficulty. Target date: 08/26/22 Goal status: 08/11/22: partially met now flexion 4/5 and ext 4+/5   5.  Pt will decrease pain at worst from 8/10 to 2/10 or better to have better sleep and occupational participation in daily roles. Target date: 08/26/22 Goal status: 08/11/22: MET    ASSESSMENT:  CLINICAL IMPRESSION: 08/11/22: Despite not meeting most goals, she has made progress toward them all, and she states being pleased with very little pain and getting back to most activities- even Pilates, though modified now. She states feeling that she can likely carry on independently and requests D/C from therapy now.  She can always return with new order in the future if having any issues. She was cautioned to be cautious for next 2 weeks as she returns to normal activities and not to go "cold [place]" off HEP. She states understanding.    PLAN: OT FREQUENCY: D/C now   OT DURATION: D/C now   PLANNED INTERVENTIONS: self care/ADL training, therapeutic exercise, therapeutic activity, neuromuscular re-education, manual therapy, passive range of motion, splinting, ultrasound, compression bandaging, moist heat, cryotherapy, contrast bath, patient/family education, and coping strategies training  RECOMMENDED  OTHER SERVICES: none now   CONSULTED AND AGREED WITH PLAN OF CARE: Patient  PLAN FOR NEXT SESSION:  D/C now, pt is satisfied   Benito Mccreedy, OTR/L, CHT 08/11/2022, 5:46 PM    OCCUPATIONAL THERAPY DISCHARGE SUMMARY  Visits from Start of Care: 3  Current functional level related to goals / functional outcomes: Pt has made significant progress towards all goals and is pleased with outcomes.   Remaining deficits: Pt has no more significant functional deficits or pain.   Education / Equipment: Pt has all needed materials and education. Pt understands how to continue on with self-management. See tx notes for more details.   Patient agrees to discharge due to max benefits received from outpatient occupational therapy / hand therapy at this time.   Benito Mccreedy, OTR/L, CHT 08/11/22

## 2022-09-27 ENCOUNTER — Other Ambulatory Visit: Payer: Self-pay | Admitting: Internal Medicine

## 2022-09-27 DIAGNOSIS — Z1239 Encounter for other screening for malignant neoplasm of breast: Secondary | ICD-10-CM

## 2022-10-26 ENCOUNTER — Other Ambulatory Visit: Payer: Self-pay | Admitting: *Deleted

## 2022-10-26 ENCOUNTER — Encounter: Payer: Self-pay | Admitting: Cardiology

## 2022-10-26 DIAGNOSIS — E785 Hyperlipidemia, unspecified: Secondary | ICD-10-CM

## 2022-11-02 LAB — LIPID PANEL
Chol/HDL Ratio: 2.2 ratio (ref 0.0–4.4)
Cholesterol, Total: 170 mg/dL (ref 100–199)
HDL: 79 mg/dL (ref >39)
LDL Chol Calc (NIH): 78 mg/dL (ref 0–99)
Triglycerides: 67 mg/dL (ref 0–149)
VLDL Cholesterol Cal: 13 mg/dL (ref 5–40)

## 2022-11-03 ENCOUNTER — Encounter: Payer: Self-pay | Admitting: Cardiology

## 2022-11-04 ENCOUNTER — Other Ambulatory Visit: Payer: Self-pay

## 2022-11-04 MED ORDER — ATORVASTATIN CALCIUM 40 MG PO TABS: 40.0000 mg | ORAL_TABLET | Freq: Every day | ORAL | 3 refills | Status: DC

## 2022-11-07 ENCOUNTER — Telehealth: Payer: Self-pay | Admitting: *Deleted

## 2022-11-07 ENCOUNTER — Telehealth: Payer: Self-pay | Admitting: Cardiology

## 2022-11-07 MED ORDER — ATORVASTATIN CALCIUM 20 MG PO TABS: ORAL_TABLET | ORAL | 3 refills | Status: DC

## 2022-11-07 MED ORDER — ATORVASTATIN CALCIUM 40 MG PO TABS: 40.0000 mg | ORAL_TABLET | Freq: Every day | ORAL | 3 refills | Status: DC

## 2022-11-07 NOTE — Telephone Encounter (Signed)
Patient informed that Dr. Percival Spanish decreased her atorvastatin to '30mg'$  daily. Patient requested '20mg'$  tablets so she can split one and take it with a '20mg'$  tablet. Prescription sent to her pharmacy.

## 2022-11-07 NOTE — Telephone Encounter (Signed)
See prior note on this.

## 2022-11-07 NOTE — Telephone Encounter (Signed)
-----   Message from Minus Breeding, MD sent at 11/03/2022  4:48 PM EST ----- LDL is still not at goal.  I would suggest to increase the Lipitor to 40 mg PO daily.  Call Ms. Lovena Le with the results and send results to Mckinley Jewel, MD

## 2022-11-07 NOTE — Addendum Note (Signed)
Addended by: Betha Loa F on: 11/07/2022 09:41 AM   Modules accepted: Orders

## 2022-11-07 NOTE — Telephone Encounter (Signed)
Pt c/o medication issue:  1. Name of Medication:   atorvastatin (LIPITOR) 20 MG tablet    2. How are you currently taking this medication (dosage and times per day)? Take orally one tablet ('20mg'$ ) and half of another tablet ('10mg'$ ) to equal '30mg'$  daily.   3. Are you having a reaction (difficulty breathing--STAT)?   4. What is your medication issue?  [facility] states they received new dosage change which insurance is requiring prior authorization for. She states they wont pay for half tablet only whole. She said she sent the PA request over or pt can have two prescriptions, one for '20mg'$  and one for 10 mg. Please advise.

## 2022-11-18 ENCOUNTER — Telehealth: Payer: Self-pay | Admitting: Hematology and Oncology

## 2022-11-18 NOTE — Telephone Encounter (Signed)
Rescheduled appointment per provider [facility]. Patient is aware of the changes made to her upcoming appointment. 

## 2022-11-29 ENCOUNTER — Ambulatory Visit
Admission: RE | Admit: 2022-11-29 | Discharge: 2022-11-29 | Disposition: A | Discharge status: Home / Self Care | Payer: Medicare Other | Source: Ambulatory Visit | Attending: Internal Medicine | Admitting: Internal Medicine

## 2022-11-29 DIAGNOSIS — Z1239 Encounter for other screening for malignant neoplasm of breast: Secondary | ICD-10-CM

## 2022-12-01 ENCOUNTER — Other Ambulatory Visit: Payer: Self-pay | Admitting: Internal Medicine

## 2022-12-01 DIAGNOSIS — R928 Other abnormal and inconclusive findings on diagnostic imaging of breast: Secondary | ICD-10-CM

## 2022-12-06 ENCOUNTER — Other Ambulatory Visit: Payer: Self-pay | Admitting: *Deleted

## 2022-12-06 DIAGNOSIS — M810 Age-related osteoporosis without current pathological fracture: Secondary | ICD-10-CM

## 2022-12-07 ENCOUNTER — Other Ambulatory Visit: Payer: Self-pay

## 2022-12-07 ENCOUNTER — Inpatient Hospital Stay: Payer: Medicare Other

## 2022-12-07 ENCOUNTER — Inpatient Hospital Stay: Payer: Medicare Other | Admitting: Adult Health

## 2022-12-07 ENCOUNTER — Other Ambulatory Visit: Payer: Medicare Other

## 2022-12-07 ENCOUNTER — Ambulatory Visit: Payer: Medicare Other

## 2022-12-07 ENCOUNTER — Inpatient Hospital Stay: Payer: Medicare Other | Attending: Adult Health

## 2022-12-07 ENCOUNTER — Ambulatory Visit: Payer: Medicare Other | Admitting: Hematology and Oncology

## 2022-12-07 VITALS — BP 133/79 | HR 41 | Temp 98.2°F | Resp 16 | Wt 134.4 lb

## 2022-12-07 DIAGNOSIS — M8580 Other specified disorders of bone density and structure, unspecified site: Secondary | ICD-10-CM | POA: Insufficient documentation

## 2022-12-07 DIAGNOSIS — Z923 Personal history of irradiation: Secondary | ICD-10-CM | POA: Diagnosis not present

## 2022-12-07 DIAGNOSIS — M810 Age-related osteoporosis without current pathological fracture: Secondary | ICD-10-CM | POA: Diagnosis not present

## 2022-12-07 DIAGNOSIS — Z170 Estrogen receptor positive status [ER+]: Secondary | ICD-10-CM

## 2022-12-07 DIAGNOSIS — C50411 Malignant neoplasm of upper-outer quadrant of right female breast: Secondary | ICD-10-CM | POA: Diagnosis not present

## 2022-12-07 DIAGNOSIS — Z79811 Long term (current) use of aromatase inhibitors: Secondary | ICD-10-CM | POA: Diagnosis not present

## 2022-12-07 LAB — CMP (CANCER CENTER ONLY)
ALT: 13 U/L (ref 0–44)
AST: 20 U/L (ref 15–41)
Albumin: 3.9 g/dL (ref 3.5–5.0)
Alkaline Phosphatase: 61 U/L (ref 38–126)
Anion gap: 5 (ref 5–15)
BUN: 14 mg/dL (ref 8–23)
CO2: 28 mmol/L (ref 22–32)
Calcium: 9.2 mg/dL (ref 8.9–10.3)
Chloride: 107 mmol/L (ref 98–111)
Creatinine: 0.78 mg/dL (ref 0.44–1.00)
GFR, Estimated: >60 mL/min (ref >60)
Glucose, Bld: 69 mg/dL — ABNORMAL LOW (ref 70–99)
Potassium: 3.8 mmol/L (ref 3.5–5.1)
Sodium: 140 mmol/L (ref 135–145)
Total Bilirubin: 0.6 mg/dL (ref 0.3–1.2)
Total Protein: 6.3 g/dL — ABNORMAL LOW (ref 6.5–8.1)

## 2022-12-07 LAB — CBC WITH DIFFERENTIAL (CANCER CENTER ONLY)
Abs Immature Granulocytes: 0.01 10*3/uL (ref 0.00–0.07)
Basophils Absolute: 0 10*3/uL (ref 0.0–0.1)
Basophils Relative: 1 %
Eosinophils Absolute: 0.5 10*3/uL (ref 0.0–0.5)
Eosinophils Relative: 12 %
HCT: 42.1 % (ref 36.0–46.0)
Hemoglobin: 14.2 g/dL (ref 12.0–15.0)
Immature Granulocytes: 0 %
Lymphocytes Relative: 22 %
Lymphs Abs: 0.9 10*3/uL (ref 0.7–4.0)
MCH: 33.3 pg (ref 26.0–34.0)
MCHC: 33.7 g/dL (ref 30.0–36.0)
MCV: 98.8 fL (ref 80.0–100.0)
Monocytes Absolute: 0.4 10*3/uL (ref 0.1–1.0)
Monocytes Relative: 9 %
Neutro Abs: 2.3 10*3/uL (ref 1.7–7.7)
Neutrophils Relative %: 56 %
Platelet Count: 240 10*3/uL (ref 150–400)
RBC: 4.26 MIL/uL (ref 3.87–5.11)
RDW: 12.7 % (ref 11.5–15.5)
WBC Count: 4.1 10*3/uL (ref 4.0–10.5)
nRBC: 0 % (ref 0.0–0.2)

## 2022-12-07 MED ORDER — SODIUM CHLORIDE 0.9 % IV SOLN: Freq: Once | INTRAVENOUS | Status: AC

## 2022-12-07 MED ORDER — ZOLEDRONIC ACID 4 MG/100ML IV SOLN
4.0000 mg | Freq: Once | INTRAVENOUS | Status: AC
  Administered 2022-12-07: 4 mg via INTRAVENOUS
  Filled 2022-12-07: qty 100

## 2022-12-07 NOTE — Patient Instructions (Signed)
Zoledronic Acid Injection What is this medication? ZOLEDRONIC ACID (ZOE le dron ik AS id) treats high calcium levels in the blood caused by cancer. It may also be used with chemotherapy to treat weakened bones caused by cancer. It works by slowing down the release of calcium from bones. This lowers calcium levels in your blood. It also makes your bones stronger and less likely to break (fracture). It belongs to a group of medications called bisphosphonates. This medicine may be used for other purposes; ask your health care provider or pharmacist if you have questions. COMMON BRAND NAME(S): Zometa, Zometa Powder What should I tell my care team before I take this medication? They need to know if you have any of these conditions: Dehydration Dental disease Kidney disease Liver disease Low levels of calcium in the blood Lung or breathing disease, such as asthma Receiving steroids, such as dexamethasone or prednisone An unusual or allergic reaction to zoledronic acid, other medications, foods, dyes, or preservatives Pregnant or trying to get pregnant Breast-feeding How should I use this medication? This medication is injected into a vein. It is given by your care team in a hospital or clinic setting. Talk to your care team about the use of this medication in children. Special care may be needed. Overdosage: If you think you have taken too much of this medicine contact a poison control center or emergency room at once. NOTE: This medicine is only for you. Do not share this medicine with others. What if I miss a dose? Keep appointments for follow-up doses. It is important not to miss your dose. Call your care team if you are unable to keep an appointment. What may interact with this medication? Certain antibiotics given by injection Diuretics, such as bumetanide, furosemide NSAIDs, medications for pain and inflammation, such as ibuprofen or naproxen Teriparatide Thalidomide This list may not  describe all possible interactions. Give your health care provider a list of all the medicines, herbs, non-prescription drugs, or dietary supplements you use. Also tell them if you smoke, drink alcohol, or use illegal drugs. Some items may interact with your medicine. What should I watch for while using this medication? Visit your care team for regular checks on your progress. It may be some time before you see the benefit from this medication. Some people who take this medication have severe bone, joint, or muscle pain. This medication may also increase your risk for jaw problems or a broken thigh bone. Tell your care team right away if you have severe pain in your jaw, bones, joints, or muscles. Tell you care team if you have any pain that does not go away or that gets worse. Tell your dentist and dental surgeon that you are taking this medication. You should not have major dental surgery while on this medication. See your dentist to have a dental exam and fix any dental problems before starting this medication. Take good care of your teeth while on this medication. Make sure you see your dentist for regular follow-up appointments. You should make sure you get enough calcium and vitamin D while you are taking this medication. Discuss the foods you eat and the vitamins you take with your care team. Check with your care team if you have severe diarrhea, nausea, and vomiting, or if you sweat a lot. The loss of too much body fluid may make it dangerous for you to take this medication. You may need bloodwork while taking this medication. Talk to your care team if you  wish to become pregnant or think you might be pregnant. This medication can cause serious birth defects. What side effects may I notice from receiving this medication? Side effects that you should report to your care team as soon as possible: Allergic reactions--skin rash, itching, hives, swelling of the face, lips, tongue, or throat Kidney  injury--decrease in the amount of urine, swelling of the ankles, hands, or feet Low calcium level--muscle pain or cramps, confusion, tingling, or numbness in the hands or feet Osteonecrosis of the jaw--pain, swelling, or redness in the mouth, numbness of the jaw, poor healing after dental work, unusual discharge from the mouth, visible bones in the mouth Severe bone, joint, or muscle pain Side effects that usually do not require medical attention (report to your care team if they continue or are bothersome): Constipation Fatigue Fever Loss of appetite Nausea Stomach pain This list may not describe all possible side effects. Call your doctor for medical advice about side effects. You may report side effects to FDA at 1-800-FDA-1088. Where should I keep my medication? This medication is given in a hospital or clinic. It will not be stored at home. NOTE: This sheet is a summary. It may not cover all possible information. If you have questions about this medicine, talk to your doctor, pharmacist, or health care provider.  2023 Elsevier/Gold Standard (2021-12-23 00:00:00)

## 2022-12-07 NOTE — Progress Notes (Signed)
[facility] up:    Bautista, Michelle Heinrich, MD [address]  [place] [address]   DIAGNOSIS:  Cancer Staging  Breast cancer of upper-outer quadrant of right female breast ([place]) Staging form: Breast, AJCC 7th Edition - Clinical: Stage IA (T1c, N0, cM0) - Unsigned Specimen type: Core Needle Biopsy Laterality: Right Staging comments: Staged at breast conference 7.9.14  - Pathologic: No stage assigned - Unsigned Specimen type: Core Needle Biopsy Laterality: Right   SUMMARY OF ONCOLOGIC HISTORY: Oncology History  Breast cancer of upper-outer quadrant of right female breast ([facility])  04/23/2013 Initial Diagnosis   Right breast calcifications stereotactic biopsy revealed invasive lobular cancer with LCIS, lymph node benign, ER positive PR negative Ki-67 10%   06/11/2013 Surgery   Right breast lumpectomy: Lobular carcinoma in situ, no invasive disease found, sentinel nodes negative   08/06/2013 - 09/04/2013 Radiation Therapy   Adjuvant radiation therapy   10/03/2013 - 12/07/2021 Anti-estrogen oral therapy   Tamoxifen 20 mg daily, switched to letrozole 10/15/18 due to PE; discontinued after 9 years of therapy     CURRENT THERAPY: Zometa  INTERVAL HISTORY: [name] 77 y.o. female returns for follow-up of her history of breast cancer and her osteoporosis.  Her most recent bone density occurred on November 03, 2021 demonstrating osteoporosis with a T-score of -2.9 in the forearm.  She was started on annual Zometa in February 2023.  She is doing well today.  She continues to tolerate the Zometa without difficulty.  She underwent bilateral breast screening mammogram on November 30, 2022 that showed breast asymmetry--diagnostic mammogram was recommended for repeat which is scheduled tomorrow.   Patient Active Problem List   Diagnosis Date Noted   Closed fracture of right distal radius 07/12/2022   Calculus of kidney 12/09/2021    Osteoporosis 12/07/2021   Nonrheumatic mitral valve regurgitation 04/07/2020   Palpitations 04/07/2020   Educated about COVID-19 virus infection 04/07/2020   Pain in right knee 02/20/2020   Kidney stone 08/12/2019   PE (pulmonary thromboembolism) ([place]) 10/03/2018   Macrocytic anemia 10/03/2018   Unilateral primary osteoarthritis, right knee 08/28/2018   History of total right knee replacement 08/28/2018   Bilateral primary osteoarthritis of knee 03/12/2018   Essential hypertension 08/26/2016   Mitral valve disorder 08/26/2016   History of palpitations 08/26/2016   Dyslipidemia 08/26/2016   Internal hemorrhoids 08/03/2015   Hx of radiation therapy    Breast cancer of upper-outer quadrant of right female breast ([place]) 05/23/2013   Personal history of colonic polyps 01/06/2012   Irritable bowel syndrome - constipation predominant 01/06/2012    is allergic to dilaudid [hydromorphone hcl], terak [terramycin], oxybutynin, amoxicillin, and erythromycin.  MEDICAL HISTORY: Past Medical History:  Diagnosis Date   Allergic rhinitis    Allergy    Arthritis    "knees" (08/28/2018)   Breast cancer, right breast ([place]) 05/20/2013   "no chemo" (08/28/2018)   Chronic lower back pain    Clotting disorder ([place])    Diverticulosis    DOE (dyspnea on exertion)    Dysrhythmia    GERD (gastroesophageal reflux disease)    uses levsin once every 2-3 months   Graves' disease    2011   Heart palpitations    History of kidney stones    HLD (hyperlipidemia)    Hx of radiation therapy 08/13/13- 09/06/13   right breast 4250 cGy 17 sessions   Hypertension    controlled   IBS (irritable bowel syndrome)  Lumbar herniated disc    L4-L5   Major depression    MVP (mitral valve prolapse)    Osteopenia    Osteoporosis    zometa infusion 11/2021- is yearly infusion   Personal history of radiation therapy    Pneumonia 08/2019   PONV (postoperative nausea and vomiting)    "when I have general  anesthetic" (08/28/2018)   Pulmonary emboli ([facility])    after knee replacement   PVC (premature ventricular contraction)    Takes metoprolol PRN for  pvc's   Tubular adenoma of colon 03/07/2012   Uterine hyperplasia    vaginal [place] every 6 months and had 2 biopsies so far with chnages noted    SURGICAL HISTORY: Past Surgical History:  Procedure Laterality Date   BREAST BIOPSY Right 2014   BREAST EXCISIONAL BIOPSY Right    BREAST LUMPECTOMY Right 2014   BREAST LUMPECTOMY WITH NEEDLE LOCALIZATION AND AXILLARY SENTINEL LYMPH NODE BX Right 06/11/2013   Procedure: BREAST LUMPECTOMY WITH NEEDLE LOCALIZATION AND AXILLARY SENTINEL LYMPH NODE BX;  Surgeon: Harl Bowie, MD;  Location: [facility];  Service: General;  Laterality: Right;  Needle localization BCG 7:30   COLONOSCOPY     COLONOSCOPY W/ BIOPSIES AND POLYPECTOMY  multiple   CYSTOSCOPY WITH STENT PLACEMENT Left 08/15/2019   Procedure: CYSTOSCOPY, left retrograde pyleogram  WITH STENT EXCHANGE left and foley placement;  Surgeon: Franchot Gallo, MD;  [facility];  Service: Urology;  Laterality: Left;   IR URETERAL STENT LEFT NEW ACCESS W/O SEP NEPHROSTOMY CATH  08/12/2019   IR URETERAL STENT LEFT NEW ACCESS W/O SEP NEPHROSTOMY CATH  12/09/2021   KNEE ARTHROSCOPY Left 2006   NEPHROLITHOTOMY Left 08/12/2019   Procedure: NEPHROLITHOTOMY PERCUTANEOUS/INSERTION DOUBLE J STENT;  Surgeon: Franchot Gallo, MD;  [facility];  Service: Urology;  Laterality: Left;  2 HRS   NEPHROLITHOTOMY Left 12/09/2021   Procedure: NEPHROLITHOTOMY PERCUTANEOUS;  Surgeon: Franchot Gallo, MD;  [facility];  Service: Urology;  Laterality: Left;  2 HRS   POLYPECTOMY     TOTAL KNEE ARTHROPLASTY Right 08/28/2018   Procedure: RIGHT TOTAL KNEE ARTHROPLASTY;  Surgeon: Garald Balding, MD;  Location: [facility];  Service: Orthopedics;  Laterality: Right;   uterine biopsy     x 2 with no sedation    SOCIAL HISTORY: Social History   Socioeconomic  History   Marital status: Married    Spouse name: Not on file   Number of children: 1   Years of education: Not on file   Highest education level: Not on file  Occupational History   Occupation: Programmer, multimedia: UNEMPLOYED  Tobacco Use   Smoking status: Never   Smokeless tobacco: Never  Vaping Use   Vaping Use: Never used  Substance and Sexual Activity   Alcohol use: Yes    Comment: 08/28/2018 "1-2 drinks/month". occas.   Drug use: Never   Sexual activity: Not Currently    Birth control/protection: Post-menopausal    Comment: menarche 44, 36 1 age 23, menopause 65, HRT x 9 yrs  Other Topics Concern   Not on file  Social History Narrative   Not on file   Social Determinants of Health   Financial Resource Strain: Not on file  Food Insecurity: Not on file  Transportation Needs: Not on file  Physical Activity: Not on file  Stress: Not on file  Social Connections: Not on file  Intimate Partner Violence: Not on file    FAMILY HISTORY: Family History  Problem Relation Age of Onset   Heart disease Mother 17       MI   Lung cancer Mother    Hypertension Mother    Alcohol abuse Father    COPD Father    Breast cancer Maternal Aunt    Pancreatic cancer Maternal Aunt    Colon cancer Maternal Grandfather 31   Diabetes Paternal Grandmother    Stomach cancer Neg Hx    Colon polyps Neg Hx    Esophageal cancer Neg Hx    Rectal cancer Neg Hx     Review of Systems  Constitutional:  Negative for appetite change, chills, fatigue, fever and unexpected weight change.  HENT:   Negative for hearing loss, lump/mass and trouble swallowing.   Eyes:  Negative for eye problems and icterus.  Respiratory:  Negative for chest tightness, cough and shortness of breath.   Cardiovascular:  Negative for chest pain, leg swelling and palpitations.  Gastrointestinal:  Negative for abdominal distention, abdominal pain, constipation, diarrhea, nausea and vomiting.  Endocrine: Negative for hot  flashes.  Genitourinary:  Negative for difficulty urinating.   Musculoskeletal:  Negative for arthralgias.  Skin:  Negative for itching and rash.  Neurological:  Negative for dizziness, extremity weakness, headaches and numbness.  Hematological:  Negative for adenopathy. Does not bruise/bleed easily.  Psychiatric/Behavioral:  Negative for depression. The patient is not nervous/anxious.       PHYSICAL EXAMINATION  ECOG PERFORMANCE STATUS: 1 - Symptomatic but completely ambulatory  Vitals:   12/07/22 1104  BP: 133/79  Pulse: (!) 41  Resp: 16  Temp: 98.2 F (36.8 C)  SpO2: 99%  HR 65 on recheck manually  Physical Exam Constitutional:      General: She is not in acute distress.    Appearance: Normal appearance. She is not toxic-appearing.  HENT:     Head: Normocephalic and atraumatic.  Eyes:     General: No scleral icterus. Cardiovascular:     Rate and Rhythm: Normal rate and regular rhythm.     Pulses: Normal pulses.     Heart sounds: Normal heart sounds.  Pulmonary:     Effort: Pulmonary effort is normal.     Breath sounds: Normal breath sounds.  Chest:     Comments: Right breast status postlumpectomy and radiation no sign of local recurrence left breast is benign. Abdominal:     General: Abdomen is flat. Bowel sounds are normal. There is no distension.     Palpations: Abdomen is soft.     Tenderness: There is no abdominal tenderness.  Musculoskeletal:        General: No swelling.     Cervical back: Neck supple.  Lymphadenopathy:     Cervical: No cervical adenopathy.  Skin:    General: Skin is warm and dry.     Findings: No rash.  Neurological:     General: No focal deficit present.     Mental Status: She is alert.  Psychiatric:        Mood and Affect: Mood normal.        Behavior: Behavior normal.     LABORATORY DATA:  CBC    Component Value Date/Time   WBC 4.1 12/07/2022 1041   WBC 3.5 (L) 12/06/2021 0827   RBC 4.26 12/07/2022 1041   HGB 14.2  12/07/2022 1041   HGB 13.9 03/04/2015 1512   HCT 42.1 12/07/2022 1041   HCT 43.6 03/04/2015 1512   PLT 240 12/07/2022 1041   PLT 193 03/04/2015 1512  MCV 98.8 12/07/2022 1041   MCV 100.5 03/04/2015 1512   MCH 33.3 12/07/2022 1041   MCHC 33.7 12/07/2022 1041   RDW 12.7 12/07/2022 1041   RDW 13.3 03/04/2015 1512   LYMPHSABS 0.9 12/07/2022 1041   LYMPHSABS 0.8 (L) 03/04/2015 1512   MONOABS 0.4 12/07/2022 1041   MONOABS 0.4 03/04/2015 1512   EOSABS 0.5 12/07/2022 1041   EOSABS 0.1 03/04/2015 1512   BASOSABS 0.0 12/07/2022 1041   BASOSABS 0.0 03/04/2015 1512    CMP     Component Value Date/Time   NA 140 12/07/2022 1041   NA 139 08/26/2014 1615   K 3.8 12/07/2022 1041   K 4.2 08/26/2014 1615   CL 107 12/07/2022 1041   CO2 28 12/07/2022 1041   CO2 27 08/26/2014 1615   GLUCOSE 69 (L) 12/07/2022 1041   GLUCOSE 77 08/26/2014 1615   BUN 14 12/07/2022 1041   BUN 16.9 08/26/2014 1615   CREATININE 0.78 12/07/2022 1041   CREATININE 0.8 08/26/2014 1615   CALCIUM 9.2 12/07/2022 1041   CALCIUM 9.1 08/26/2014 1615   PROT 6.3 (L) 12/07/2022 1041   PROT 6.8 08/26/2014 1615   ALBUMIN 3.9 12/07/2022 1041   ALBUMIN 3.8 08/26/2014 1615   AST 20 12/07/2022 1041   AST 19 08/26/2014 1615   ALT 13 12/07/2022 1041   ALT 14 08/26/2014 1615   ALKPHOS 61 12/07/2022 1041   ALKPHOS 55 08/26/2014 1615   BILITOT 0.6 12/07/2022 1041   BILITOT 0.34 08/26/2014 1615   GFRNONAA >60 12/07/2022 1041   GFRAA >60 08/07/2019 1354      ASSESSMENT and THERAPY PLAN:   Breast cancer of upper-outer quadrant of right female breast ([place]) [name] is a 77 year old woman with history of right-sided invasive lobular carcinoma diagnosed in June 2014 ER positive status post right lumpectomy followed by adjuvant radiation and tamoxifen for 5 years followed by letrozole through January 2023.  [name] has no clinical or radiographic signs of breast cancer recurrence.  She will undergo repeat diagnostic mammogram  tomorrow.  I gave her reassurance that a lot of times the issue they see on screening mammogram clears up with the diagnostic.  We reviewed the role of Zometa and her bone density treatment.  She will proceed with this today.  She has no side effects from taking this.  I ordered a repeat bone density test to be completed in December 2024.  She will return in 1 year for labs, follow-up, and Zometa.  All questions were answered. The patient knows to call the clinic with any problems, questions or concerns. We can certainly see the patient much sooner if necessary.  Total encounter time:23 minutes*in face-to-face visit time, chart review, lab review, care coordination, order entry, and documentation of the encounter time.   Wilber Bihari, NP 12/09/22 1:42 PM Medical Oncology and Hematology [facility] [place], [place] [address] Tel. [phone]    Fax. [phone]  *Total Encounter Time as defined by the Centers for Medicare and Medicaid Services includes, in addition to the face-to-face time of a patient visit (documented in the note above) non-face-to-face time: obtaining and reviewing outside history, ordering and reviewing medications, tests or procedures, care coordination (communications with other health care professionals or caregivers) and documentation in the medical record.

## 2022-12-08 ENCOUNTER — Ambulatory Visit
Admission: RE | Admit: 2022-12-08 | Discharge: 2022-12-08 | Disposition: A | Discharge status: Home / Self Care | Payer: Medicare Other | Source: Ambulatory Visit | Attending: Internal Medicine | Admitting: Internal Medicine

## 2022-12-08 ENCOUNTER — Ambulatory Visit: Payer: Medicare Other

## 2022-12-08 DIAGNOSIS — R928 Other abnormal and inconclusive findings on diagnostic imaging of breast: Secondary | ICD-10-CM

## 2022-12-09 ENCOUNTER — Encounter: Payer: Self-pay | Admitting: Adult Health

## 2022-12-09 ENCOUNTER — Encounter: Payer: Self-pay | Admitting: Hematology and Oncology

## 2022-12-09 NOTE — Assessment & Plan Note (Signed)
[name] is a 77 year old woman with history of right-sided invasive lobular carcinoma diagnosed in June 2014 ER positive status post right lumpectomy followed by adjuvant radiation and tamoxifen for 5 years followed by letrozole through January 2023.  [name] has no clinical or radiographic signs of breast cancer recurrence.  She will undergo repeat diagnostic mammogram tomorrow.  I gave her reassurance that a lot of times the issue they see on screening mammogram clears up with the diagnostic.  We reviewed the role of Zometa and her bone density treatment.  She will proceed with this today.  She has no side effects from taking this.  I ordered a repeat bone density test to be completed in December 2024.  She will return in 1 year for labs, follow-up, and Zometa.

## 2023-01-20 ENCOUNTER — Encounter: Payer: Self-pay | Admitting: Cardiology

## 2023-01-20 DIAGNOSIS — E785 Hyperlipidemia, unspecified: Secondary | ICD-10-CM

## 2023-01-28 LAB — LIPID PANEL
Chol/HDL Ratio: 2 ratio (ref 0.0–4.4)
Cholesterol, Total: 165 mg/dL (ref 100–199)
HDL: 81 mg/dL (ref >39)
LDL Chol Calc (NIH): 73 mg/dL (ref 0–99)
Triglycerides: 54 mg/dL (ref 0–149)
VLDL Cholesterol Cal: 11 mg/dL (ref 5–40)

## 2023-02-01 ENCOUNTER — Encounter: Payer: Self-pay | Admitting: *Deleted

## 2023-03-09 ENCOUNTER — Other Ambulatory Visit (HOSPITAL_BASED_OUTPATIENT_CLINIC_OR_DEPARTMENT_OTHER): Payer: Self-pay

## 2023-03-09 MED ORDER — SHINGRIX 50 MCG/0.5ML IM SUSR
0.5000 mL | INTRAMUSCULAR | 0 refills | Status: DC
  Filled 2023-03-09: qty 0.5, 1d supply, fill #0

## 2023-04-26 ENCOUNTER — Encounter: Payer: Self-pay | Admitting: Orthopedic Surgery

## 2023-04-26 ENCOUNTER — Ambulatory Visit: Payer: Medicare Other | Admitting: Orthopedic Surgery

## 2023-04-26 ENCOUNTER — Other Ambulatory Visit (INDEPENDENT_AMBULATORY_CARE_PROVIDER_SITE_OTHER): Payer: Medicare Other

## 2023-04-26 DIAGNOSIS — G8929 Other chronic pain: Secondary | ICD-10-CM

## 2023-04-26 DIAGNOSIS — M545 Low back pain, unspecified: Secondary | ICD-10-CM

## 2023-04-26 MED ORDER — PREDNISONE 5 MG (21) PO TBPK: ORAL_TABLET | ORAL | 0 refills | Status: DC

## 2023-04-26 NOTE — Progress Notes (Unsigned)
Office Visit Note   Patient: [name]           Date of Birth: 30-Apr-1946           MRN: [id number] Visit Date: 04/26/2023 Requested by: Ollen Bowl, MD [address] [place],  [place] [address] PCP: Ollen Bowl, MD  Subjective: Chief Complaint  Patient presents with   Right Knee - Pain    HPI: [name] is a 77 y.o. female who presents to the office reporting right knee and leg pain with some back pain.  The pain started a month ago.  It was back pain and then it moved into the groin and then into the thigh.  Patient had total knee replacement in 2018.  Ibuprofen helps her symptoms.  Occasionally the pain will wake her from sleep at night.  She describes having 10 minutes of walking endurance.  Muscle relaxer has not been helpful.  She has a history of having back injection with Dr. Alvester Morin for low back pain.  Most of the pain currently is in the groin and thigh.  Does not tend to travel below the knee..                ROS: All systems reviewed are negative as they relate to the chief complaint within the history of present illness.  Patient denies fevers or chills.  Assessment & Plan: Visit Diagnoses:  1. Chronic right-sided low back pain, unspecified whether sciatica present     Plan: Impression is well-functioning right total knee replacement with no effusion and good appearance on radiographs.  She does have slight narrowing of the joint space on the right compared to the left but still has excellent motion with no real irritability of the hip on exam.  I think it is most likely that this to be coming from her back.  Would like to try Medrol Dosepak 6-day course and if that does not help I think hip injection would be the next diagnostic and therapeutic option.  Follow-Up Instructions: No follow-ups on file.   Orders:  Orders Placed This Encounter  Procedures   XR Lumbar Spine 2-3 Views   XR Pelvis 1-2 Views   Meds ordered this  encounter  Medications   predniSONE (STERAPRED UNI-PAK 21 TAB) 5 MG (21) TBPK tablet    Sig: Take dosepak as directed    Dispense:  21 tablet    Refill:  0      Procedures: No procedures performed   Clinical Data: No additional findings.  Objective: Vital Signs: There were no vitals taken for this visit.  Physical Exam:  Constitutional: Patient appears well-developed HEENT:  Head: Normocephalic Eyes:EOM are normal Neck: Normal range of motion Cardiovascular: Normal rate Pulmonary/chest: Effort normal Neurologic: Patient is alert Skin: Skin is warm Psychiatric: Patient has normal mood and affect  Ortho Exam: Ortho exam demonstrates normal gait alignment.  Has 5 out of 5 ankle dorsiflexion plantarflexion quad and hip chain strength.  No real groin pain or reproduction of symptoms with internal and external rotation of the right or left leg.  No restriction of internal rotation of either hip joint.  Minimal pain with forward and lateral bending.  No paresthesias L1 S1 bilaterally.  Specialty Comments:  No specialty comments available.  Imaging: No results found.   PMFS History: Patient Active Problem List   Diagnosis Date Noted   Closed fracture of right distal radius 07/12/2022   Calculus of kidney  12/09/2021   Osteoporosis 12/07/2021   Nonrheumatic mitral valve regurgitation 04/07/2020   Palpitations 04/07/2020   Educated about COVID-19 virus infection 04/07/2020   Pain in right knee 02/20/2020   Kidney stone 08/12/2019   PE (pulmonary thromboembolism) ([facility]) 10/03/2018   Macrocytic anemia 10/03/2018   Unilateral primary osteoarthritis, right knee 08/28/2018   History of total right knee replacement 08/28/2018   Bilateral primary osteoarthritis of knee 03/12/2018   Essential hypertension 08/26/2016   Mitral valve disorder 08/26/2016   History of palpitations 08/26/2016   Dyslipidemia 08/26/2016   Internal hemorrhoids 08/03/2015   Hx of radiation therapy     Breast cancer of upper-outer quadrant of right female breast ([facility]) 05/23/2013   Personal history of colonic polyps 01/06/2012   Irritable bowel syndrome - constipation predominant 01/06/2012   Past Medical History:  Diagnosis Date   Allergic rhinitis    Allergy    Arthritis    "knees" (08/28/2018)   Breast cancer, right breast ([facility]) 05/20/2013   "no chemo" (08/28/2018)   Chronic lower back pain    Clotting disorder ([facility])    Diverticulosis    DOE (dyspnea on exertion)    Dysrhythmia    GERD (gastroesophageal reflux disease)    uses levsin once every 2-3 months   Graves' disease    2011   Heart palpitations    History of kidney stones    HLD (hyperlipidemia)    Hx of radiation therapy 08/13/13- 09/06/13   right breast 4250 cGy 17 sessions   Hypertension    controlled   IBS (irritable bowel syndrome)    Lumbar herniated disc    L4-L5   Major depression    MVP (mitral valve prolapse)    Osteopenia    Osteoporosis    zometa infusion 11/2021- is yearly infusion   Personal history of radiation therapy    Pneumonia 08/2019   PONV (postoperative nausea and vomiting)    "when I have general anesthetic" (08/28/2018)   Pulmonary emboli ([facility])    after knee replacement   PVC (premature ventricular contraction)    Takes metoprolol PRN for  pvc's   Tubular adenoma of colon 03/07/2012   Uterine hyperplasia    vaginal [place] every 6 months and had 2 biopsies so far with chnages noted    Family History  Problem Relation Age of Onset   Heart disease Mother 24       MI   Lung cancer Mother    Hypertension Mother    Alcohol abuse Father    COPD Father    Breast cancer Maternal Aunt    Pancreatic cancer Maternal Aunt    Colon cancer Maternal Grandfather 10   Diabetes Paternal Grandmother    Stomach cancer Neg Hx    Colon polyps Neg Hx    Esophageal cancer Neg Hx    Rectal cancer Neg Hx     Past Surgical History:  Procedure Laterality Date   BREAST BIOPSY Right 2014   BREAST  EXCISIONAL BIOPSY Right    BREAST LUMPECTOMY Right 2014   BREAST LUMPECTOMY WITH NEEDLE LOCALIZATION AND AXILLARY SENTINEL LYMPH NODE BX Right 06/11/2013   Procedure: BREAST LUMPECTOMY WITH NEEDLE LOCALIZATION AND AXILLARY SENTINEL LYMPH NODE BX;  Surgeon: Shelly Rubenstein, MD;  [facility];  Service: General;  Laterality: Right;  Needle localization BCG 7:30   COLONOSCOPY     COLONOSCOPY W/ BIOPSIES AND POLYPECTOMY  multiple   CYSTOSCOPY WITH STENT PLACEMENT Left 08/15/2019   Procedure: CYSTOSCOPY, left  retrograde pyleogram  WITH STENT EXCHANGE left and foley placement;  Surgeon: Marcine Matar, MD;  [facility];  Service: Urology;  Laterality: Left;   IR URETERAL STENT LEFT NEW ACCESS W/O SEP NEPHROSTOMY CATH  08/12/2019   IR URETERAL STENT LEFT NEW ACCESS W/O SEP NEPHROSTOMY CATH  12/09/2021   KNEE ARTHROSCOPY Left 2006   NEPHROLITHOTOMY Left 08/12/2019   Procedure: NEPHROLITHOTOMY PERCUTANEOUS/INSERTION DOUBLE J STENT;  Surgeon: Marcine Matar, MD;  [facility];  Service: Urology;  Laterality: Left;  2 HRS   NEPHROLITHOTOMY Left 12/09/2021   Procedure: NEPHROLITHOTOMY PERCUTANEOUS;  Surgeon: Marcine Matar, MD;  [facility];  Service: Urology;  Laterality: Left;  2 HRS   POLYPECTOMY     TOTAL KNEE ARTHROPLASTY Right 08/28/2018   Procedure: RIGHT TOTAL KNEE ARTHROPLASTY;  Surgeon: Valeria Batman, MD;  Location[facility];  Service: Orthopedics;  Laterality: Right;   uterine biopsy     x 2 with no sedation   Social History   Occupational History   Occupation: Teacher, adult education: UNEMPLOYED  Tobacco Use   Smoking status: Never   Smokeless tobacco: Never  Vaping Use   Vaping Use: Never used  Substance and Sexual Activity   Alcohol use: Yes    Comment: 08/28/2018 "1-2 drinks/month". occas.   Drug use: Never   Sexual activity: Not Currently    Birth control/protection: Post-menopausal    Comment: menarche 10, G 1 age 53, menopause 9, HRT x 9 yrs

## 2023-05-01 ENCOUNTER — Other Ambulatory Visit: Payer: Self-pay | Admitting: Orthopedic Surgery

## 2023-05-03 ENCOUNTER — Encounter: Payer: Self-pay | Admitting: Orthopedic Surgery

## 2023-05-03 DIAGNOSIS — G8929 Other chronic pain: Secondary | ICD-10-CM

## 2023-05-03 NOTE — Telephone Encounter (Signed)
No more steroid dose pack. Options would be trying a hip joint injection in the office vs MRI L-Spine

## 2023-05-05 NOTE — Telephone Encounter (Signed)
Ordered

## 2023-05-05 NOTE — Telephone Encounter (Signed)
Hey autumn can you set up MRI L spine with open MRI or offer sedation instead if she thinks she can do that with regular MRI

## 2023-05-17 ENCOUNTER — Encounter (HOSPITAL_BASED_OUTPATIENT_CLINIC_OR_DEPARTMENT_OTHER): Payer: Self-pay | Admitting: Student

## 2023-05-17 ENCOUNTER — Ambulatory Visit (HOSPITAL_BASED_OUTPATIENT_CLINIC_OR_DEPARTMENT_OTHER): Payer: Medicare Other | Admitting: Student

## 2023-05-17 DIAGNOSIS — G8929 Other chronic pain: Secondary | ICD-10-CM

## 2023-05-17 DIAGNOSIS — M25551 Pain in right hip: Secondary | ICD-10-CM

## 2023-05-17 DIAGNOSIS — M545 Low back pain, unspecified: Secondary | ICD-10-CM

## 2023-05-17 MED ORDER — LIDOCAINE HCL 1 % IJ SOLN
4.0000 mL | INTRAMUSCULAR | Status: AC | PRN
  Administered 2023-05-17: 4 mL

## 2023-05-17 MED ORDER — TRIAMCINOLONE ACETONIDE 40 MG/ML IJ SUSP
2.0000 mL | INTRAMUSCULAR | Status: AC | PRN
  Administered 2023-05-17: 2 mL via INTRA_ARTICULAR

## 2023-05-17 NOTE — Progress Notes (Signed)
Chief Complaint: Right hip pain     History of Present Illness:    [name] is a 77 y.o. female presenting today with persistent pain in her right hip.  The pain is located mostly in the groin area.  States that this pain began approximately 2 months ago and was preceded by pain in her low back.  It has worsened today and she has had increased pain with weightbearing that is moderate in severity.  She has been taking Advil around-the-clock for pain which has given her some relief.  Has also been seen recently by a chiropractor which has given her some temporary relief.  She has been seeing Dr. August Saucer for her hip and back pain and was recently given a Medrol dose pack which significantly improved symptoms while on the medication which have since returned.  She is scheduled for an MRI of her lumbar spine this upcoming Monday on 05/22/2023.   Surgical History:   Right TKA 2019  PMH/PSH/Family History/Social History/Meds/Allergies:    Past Medical History:  Diagnosis Date   Allergic rhinitis    Allergy    Arthritis    "knees" (08/28/2018)   Breast cancer, right breast (HCC) 05/20/2013   "no chemo" (08/28/2018)   Chronic lower back pain    Clotting disorder (HCC)    Diverticulosis    DOE (dyspnea on exertion)    Dysrhythmia    GERD (gastroesophageal reflux disease)    uses levsin once every 2-3 months   Graves' disease    2011   Heart palpitations    History of kidney stones    HLD (hyperlipidemia)    Hx of radiation therapy 08/13/13- 09/06/13   right breast 4250 cGy 17 sessions   Hypertension    controlled   IBS (irritable bowel syndrome)    Lumbar herniated disc    L4-L5   Major depression    MVP (mitral valve prolapse)    Osteopenia    Osteoporosis    zometa infusion 11/2021- is yearly infusion   Personal history of radiation therapy    Pneumonia 08/2019   PONV (postoperative nausea and vomiting)    "when I have general  anesthetic" (08/28/2018)   Pulmonary emboli (HCC)    after knee replacement   PVC (premature ventricular contraction)    Takes metoprolol PRN for  pvc's   Tubular adenoma of colon 03/07/2012   Uterine hyperplasia    vaginal [place] every 6 months and had 2 biopsies so far with chnages noted   Past Surgical History:  Procedure Laterality Date   BREAST BIOPSY Right 2014   BREAST EXCISIONAL BIOPSY Right    BREAST LUMPECTOMY Right 2014   BREAST LUMPECTOMY WITH NEEDLE LOCALIZATION AND AXILLARY SENTINEL LYMPH NODE BX Right 06/11/2013   Procedure: BREAST LUMPECTOMY WITH NEEDLE LOCALIZATION AND AXILLARY SENTINEL LYMPH NODE BX;  Surgeon: Shelly Rubenstein, MD;  Location: [facility];  Service: General;  Laterality: Right;  Needle localization BCG 7:30   COLONOSCOPY     COLONOSCOPY W/ BIOPSIES AND POLYPECTOMY  multiple   CYSTOSCOPY WITH STENT PLACEMENT Left 08/15/2019   Procedure: CYSTOSCOPY, left retrograde pyleogram  WITH STENT EXCHANGE left and foley placement;  Surgeon: Marcine Matar, MD;  Location: [facility];  Service: Urology;  Laterality: Left;   IR URETERAL STENT LEFT NEW ACCESS W/O  SEP NEPHROSTOMY CATH  08/12/2019   IR URETERAL STENT LEFT NEW ACCESS W/O SEP NEPHROSTOMY CATH  12/09/2021   KNEE ARTHROSCOPY Left 2006   NEPHROLITHOTOMY Left 08/12/2019   Procedure: NEPHROLITHOTOMY PERCUTANEOUS/INSERTION DOUBLE J STENT;  Surgeon: Marcine Matar, MD;  Location: [facility];  Service: Urology;  Laterality: Left;  2 HRS   NEPHROLITHOTOMY Left 12/09/2021   Procedure: NEPHROLITHOTOMY PERCUTANEOUS;  Surgeon: Marcine Matar, MD;  Location: [facility];  Service: Urology;  Laterality: Left;  2 HRS   POLYPECTOMY     TOTAL KNEE ARTHROPLASTY Right 08/28/2018   Procedure: RIGHT TOTAL KNEE ARTHROPLASTY;  Surgeon: Valeria Batman, MD;  Location: [facility];  Service: Orthopedics;  Laterality: Right;   uterine biopsy     x 2 with no sedation   Social History   Socioeconomic History   Marital status: Married     Spouse name: Not on file   Number of children: 1   Years of education: Not on file   Highest education level: Not on file  Occupational History   Occupation: Teacher, adult education: UNEMPLOYED  Tobacco Use   Smoking status: Never   Smokeless tobacco: Never  Vaping Use   Vaping Use: Never used  Substance and Sexual Activity   Alcohol use: Yes    Comment: 08/28/2018 "1-2 drinks/month". occas.   Drug use: Never   Sexual activity: Not Currently    Birth control/protection: Post-menopausal    Comment: menarche 31, G 1 age 4, menopause 10, HRT x 9 yrs  Other Topics Concern   Not on file  Social History Narrative   Not on file   Social Determinants of Health   Financial Resource Strain: Not on file  Food Insecurity: Not on file  Transportation Needs: Not on file  Physical Activity: Not on file  Stress: Not on file  Social Connections: Not on file   Family History  Problem Relation Age of Onset   Heart disease Mother 69       MI   Lung cancer Mother    Hypertension Mother    Alcohol abuse Father    COPD Father    Breast cancer Maternal Aunt    Pancreatic cancer Maternal Aunt    Colon cancer Maternal Grandfather 62   Diabetes Paternal Grandmother    Stomach cancer Neg Hx    Colon polyps Neg Hx    Esophageal cancer Neg Hx    Rectal cancer Neg Hx    Allergies  Allergen Reactions   Dilaudid [Hydromorphone Hcl] Nausea And Vomiting   Terak [Terramycin] Rash   Oxybutynin Other (See Comments)    Patient reports hallucinations.    Amoxicillin Diarrhea    Has patient had a amoxicillin reaction causing immediate rash, facial/tongue/throat swelling, SOB or lightheadedness with hypotension: No Has patient had a PCN reaction causing severe rash involving mucus membranes or skin necrosis: No Has patient had a PCN reaction that required hospitalization: No Has patient had a PCN reaction occurring within the last 10 years: Unknown If all of the above answers are "NO", then may proceed  with Cephalosporin use.    Erythromycin Rash   Current Outpatient Medications  Medication Sig Dispense Refill   atorvastatin (LIPITOR) 40 MG tablet Take 1 tablet (40 mg total) by mouth daily. Take orally one tablet (20mg ) and half of another tablet (10mg ) to equal 30mg  daily. (Patient taking differently: Take 30 mg by mouth daily. Take orally one tablet (20mg ) and half of another tablet (10mg ) to equal  30mg  daily.) 90 tablet 3   buPROPion (WELLBUTRIN XL) 300 MG 24 hr tablet 1 tablet in the morning Orally Once a day 30 tablet 0   Calcium Carb-Cholecalciferol (CALCIUM 600 + D PO) Take 1 tablet by mouth at bedtime.     ECHINACEA PO Take 1,300 mg by mouth daily as needed (immune support).     fluticasone (FLONASE) 50 MCG/ACT nasal spray Place 2 sprays into the nose daily.      hyoscyamine (LEVSIN SL) 0.125 MG SL tablet Take 1 tablet by mouth every 6-8 hours as needed for abdominal pain (Patient taking differently: Take 0.125 mg by mouth every 6 (six) hours as needed (heartburn).) 60 tablet 2   ibuprofen (ADVIL) 200 MG tablet Take 200-400 mg by mouth every 8 (eight) hours as needed (pain.).     metoprolol tartrate (LOPRESSOR) 25 MG tablet Take 0.5 tablets (12.5 mg total) by mouth 2 (two) times daily. 90 tablet 3   Multiple Vitamin (MULTIVITAMIN WITH MINERALS) TABS tablet Take 1 tablet by mouth daily.     polyethylene glycol (MIRALAX / GLYCOLAX) 17 g packet Take 17 g by mouth daily as needed for moderate constipation.     predniSONE (STERAPRED UNI-PAK 21 TAB) 5 MG (21) TBPK tablet Take dosepak as directed 21 tablet 0   Probiotic Product (PROBIOTIC DAILY PO) Take 1 capsule by mouth daily.     senna (SENOKOT) 8.6 MG tablet Take 1 tablet by mouth daily as needed for constipation.     Sod Fluoride-Potassium Nitrate (PREVIDENT 5000 ENAMEL PROTECT) 1.1-5 % GEL Place 1 application onto teeth daily.     SYNTHROID 75 MCG tablet TAKE ONE TABLET EVERY MORNING ON AN EMPTY STOMACH 30 tablet 3   Turmeric (QC  TUMERIC COMPLEX PO) Take 1,500 mg by mouth daily.     zolpidem (AMBIEN) 10 MG tablet Take 2.5 mg by mouth at bedtime as needed for sleep.      Zoster Vaccine Adjuvanted [facility]) injection Inject 0.5 mLs into the muscle. 0.5 mL 0   No current facility-administered medications for this visit.   No results found.  Review of Systems:   A ROS was performed including pertinent positives and negatives as documented in the HPI.  Physical Exam :   Constitutional: NAD and appears stated age Neurological: Alert and oriented Psych: Appropriate affect and cooperative There were no vitals taken for this visit.   Comprehensive Musculoskeletal Exam:    No tenderness to palpation over the anterior or lateral right hip.  Passive range of motion in seated position to 120 degrees flexion and 20 degrees of internal and external rotation.  Hip flexion as well as knee flexion extension strength is 5 out of 5.  Neurosensory exam intact.  Imaging:     Assessment:   77 y.o. female with worsening groin pain of the right hip.  Previous radiographs revealed mild osteoarthritis of the right hip.  She is currently being worked up for low back issues and has an MRI scheduled for next week.  Due to her current symptoms, there is possibility of the hip joint as a contributor to her pain although x-rays do not show significant degenerative changes.  At this point I would recommend a diagnostic and therapeutic injection of the right hip which patient would like to proceed with today.  This was performed under ultrasound guidance without any complication.  Will plan to have her complete MRI of the low back as scheduled next week and follow-up with Dr. August Saucer to  discuss results as well as assess relief with injection.  Plan :    - Ultrasound-guided right hip injection performed today - Obtain lumbar MRI and follow up with Dr August Saucer to discuss results     Procedure Note  Patient: [name]             Date  of Birth: [birth year]           MRN: [id number]             Visit Date: 05/17/2023  Procedures: Visit Diagnoses:  1. Pain in right hip   2. Chronic right-sided low back pain, unspecified whether sciatica present     Large Joint Inj: R hip joint on 05/17/2023 3:06 PM Indications: pain Details: 22 G 3.5 in needle, ultrasound-guided anterior approach Medications: 2 mL triamcinolone acetonide 40 MG/ML; 4 mL lidocaine 1 % Outcome: tolerated well, no immediate complications Consent was given by the patient. Immediately prior to procedure a time out was called to verify the correct patient, procedure, equipment, support staff and site/side marked as required. Patient was prepped and draped in the usual sterile fashion.      I personally saw and evaluated the patient, and participated in the management and treatment plan.  Hazle Nordmann, PA-C Orthopedics  This document was dictated using Conservation officer, historic buildings. A reasonable attempt at proof reading has been made to minimize errors.

## 2023-05-18 ENCOUNTER — Encounter (HOSPITAL_BASED_OUTPATIENT_CLINIC_OR_DEPARTMENT_OTHER): Payer: Self-pay

## 2023-05-22 ENCOUNTER — Encounter (HOSPITAL_BASED_OUTPATIENT_CLINIC_OR_DEPARTMENT_OTHER): Payer: Self-pay

## 2023-06-09 ENCOUNTER — Ambulatory Visit (INDEPENDENT_AMBULATORY_CARE_PROVIDER_SITE_OTHER): Payer: Medicare Other | Admitting: Orthopedic Surgery

## 2023-06-09 ENCOUNTER — Encounter: Payer: Self-pay | Admitting: Orthopedic Surgery

## 2023-06-09 DIAGNOSIS — M545 Low back pain, unspecified: Secondary | ICD-10-CM | POA: Diagnosis not present

## 2023-06-09 DIAGNOSIS — G8929 Other chronic pain: Secondary | ICD-10-CM

## 2023-06-09 NOTE — Progress Notes (Signed)
Office Visit Note   Patient: [name]           Date of Birth: 10-Jul-1946           MRN: [id number] Visit Date: 06/09/2023 Requested by: Ollen Bowl, MD [address] [place],  [place] [address] PCP: Ollen Bowl, MD  Subjective: Chief Complaint  Patient presents with   Lower Back - Pain    HPI: [name] is a 77 y.o. female who presents to the office reporting low back pain and right-sided leg pain.  She has gone to [facility] and had a diagnostic right hip injection which did give her 3 days of reasonable relief and then the pain recurred.  She does not have much hip arthritis on plain radiographs.  I requested MRI scan of her lumbar spine which does show severe right-sided foraminal stenosis at L4-5 and L5-S1.  She does report pain in the anterior thigh and knee.  Denies much in the way of symptoms below the knees.  Unless she takes ibuprofen on a daily basis she really has a hard time walking.  She states she is relatively asymptomatic on Advil.  Does not use assistive walking devices.  Did have epidural steroid injections in 2017 x 3 which helped but the pain is since became worse.  At that time her findings were mild to at most moderate in terms of foraminal stenosis.  Currently they are severe..                ROS: All systems reviewed are negative as they relate to the chief complaint within the history of present illness.  Patient denies fevers or chills.  Assessment & Plan: Visit Diagnoses:  1. Chronic right-sided low back pain, unspecified whether sciatica present     Plan: Impression is right-sided lumbar spine foraminal stenosis with pretty normal hip radiographs and exam today.  Epidural spine injections indicated with Dr. Alvester Morin or [facility] imaging whoever can get her in the soonest.  She may need referral to Dr. Christell Constant after that.  I do not think imaging of the hip is indicated at this time based on the severity of her  neuroforaminal stenosis and the debilitating nature of her pain.  Would not really expect that type of debilitating pain from any hip pathology with normal exam and normal radiographs.  Stress reaction is a remote possibility for the right hip but she is not really reporting any discrete groin pain.  If she gets no relief from back injections we could consider MRI of the pelvis to evaluate the hip.  Follow-Up Instructions: No follow-ups on file.   Orders:  Orders Placed This Encounter  Procedures   Ambulatory referral to Physical Medicine Rehab   No orders of the defined types were placed in this encounter.     Procedures: No procedures performed   Clinical Data: No additional findings.  Objective: Vital Signs: There were no vitals taken for this visit.  Physical Exam:  Constitutional: Patient appears well-developed HEENT:  Head: Normocephalic Eyes:EOM are normal Neck: Normal range of motion Cardiovascular: Normal rate Pulmonary/chest: Effort normal Neurologic: Patient is alert Skin: Skin is warm Psychiatric: Patient has normal mood and affect  Ortho Exam: Ortho exam today demonstrates 5 out of 5 ankle dorsiflexion plantarflexion quad hamstring strength.  Also has symmetric hip flexion strength at 5 out of 5.  No groin pain with internal/external rotation of either hip.  Has full active and passive range  of motion of the hip with circumduction as well as abduction and adduction and extension and flexion.  No paresthesias L1 S1 bilaterally.  No nerve root tension signs.  No muscle atrophy right leg versus left.  Specialty Comments:  No specialty comments available.  Imaging: No results found.   PMFS History: Patient Active Problem List   Diagnosis Date Noted   Closed fracture of right distal radius 07/12/2022   Calculus of kidney 12/09/2021   Osteoporosis 12/07/2021   Nonrheumatic mitral valve regurgitation 04/07/2020   Palpitations 04/07/2020   Educated about  COVID-19 virus infection 04/07/2020   Pain in right knee 02/20/2020   Kidney stone 08/12/2019   PE (pulmonary thromboembolism) (HCC) 10/03/2018   Macrocytic anemia 10/03/2018   Unilateral primary osteoarthritis, right knee 08/28/2018   History of total right knee replacement 08/28/2018   Bilateral primary osteoarthritis of knee 03/12/2018   Essential hypertension 08/26/2016   Mitral valve disorder 08/26/2016   History of palpitations 08/26/2016   Dyslipidemia 08/26/2016   Internal hemorrhoids 08/03/2015   Hx of radiation therapy    Breast cancer of upper-outer quadrant of right female breast (HCC) 05/23/2013   Personal history of colonic polyps 01/06/2012   Irritable bowel syndrome - constipation predominant 01/06/2012   Past Medical History:  Diagnosis Date   Allergic rhinitis    Allergy    Arthritis    "knees" (08/28/2018)   Breast cancer, right breast (HCC) 05/20/2013   "no chemo" (08/28/2018)   Chronic lower back pain    Clotting disorder (HCC)    Diverticulosis    DOE (dyspnea on exertion)    Dysrhythmia    GERD (gastroesophageal reflux disease)    uses levsin once every 2-3 months   Graves' disease    2011   Heart palpitations    History of kidney stones    HLD (hyperlipidemia)    Hx of radiation therapy 08/13/13- 09/06/13   right breast 4250 cGy 17 sessions   Hypertension    controlled   IBS (irritable bowel syndrome)    Lumbar herniated disc    L4-L5   Major depression    MVP (mitral valve prolapse)    Osteopenia    Osteoporosis    zometa infusion 11/2021- is yearly infusion   Personal history of radiation therapy    Pneumonia 08/2019   PONV (postoperative nausea and vomiting)    "when I have general anesthetic" (08/28/2018)   Pulmonary emboli (HCC)    after knee replacement   PVC (premature ventricular contraction)    Takes metoprolol PRN for  pvc's   Tubular adenoma of colon 03/07/2012   Uterine hyperplasia    vaginal [place] every 6 months and had 2  biopsies so far with chnages noted    Family History  Problem Relation Age of Onset   Heart disease Mother 39       MI   Lung cancer Mother    Hypertension Mother    Alcohol abuse Father    COPD Father    Breast cancer Maternal Aunt    Pancreatic cancer Maternal Aunt    Colon cancer Maternal Grandfather 23   Diabetes Paternal Grandmother    Stomach cancer Neg Hx    Colon polyps Neg Hx    Esophageal cancer Neg Hx    Rectal cancer Neg Hx     Past Surgical History:  Procedure Laterality Date   BREAST BIOPSY Right 2014   BREAST EXCISIONAL BIOPSY Right    BREAST LUMPECTOMY Right 2014  BREAST LUMPECTOMY WITH NEEDLE LOCALIZATION AND AXILLARY SENTINEL LYMPH NODE BX Right 06/11/2013   Procedure: BREAST LUMPECTOMY WITH NEEDLE LOCALIZATION AND AXILLARY SENTINEL LYMPH NODE BX;  Surgeon: Shelly Rubenstein, MD;  Location: [facility];  Service: General;  Laterality: Right;  Needle localization BCG 7:30   COLONOSCOPY     COLONOSCOPY W/ BIOPSIES AND POLYPECTOMY  multiple   CYSTOSCOPY WITH STENT PLACEMENT Left 08/15/2019   Procedure: CYSTOSCOPY, left retrograde pyleogram  WITH STENT EXCHANGE left and foley placement;  Surgeon: Marcine Matar, MD;  Location: [facility];  Service: Urology;  Laterality: Left;   IR URETERAL STENT LEFT NEW ACCESS W/O SEP NEPHROSTOMY CATH  08/12/2019   IR URETERAL STENT LEFT NEW ACCESS W/O SEP NEPHROSTOMY CATH  12/09/2021   KNEE ARTHROSCOPY Left 2006   NEPHROLITHOTOMY Left 08/12/2019   Procedure: NEPHROLITHOTOMY PERCUTANEOUS/INSERTION DOUBLE J STENT;  Surgeon: Marcine Matar, MD;  Location: [facility];  Service: Urology;  Laterality: Left;  2 HRS   NEPHROLITHOTOMY Left 12/09/2021   Procedure: NEPHROLITHOTOMY PERCUTANEOUS;  Surgeon: Marcine Matar, MD;  Location: [facility];  Service: Urology;  Laterality: Left;  2 HRS   POLYPECTOMY     TOTAL KNEE ARTHROPLASTY Right 08/28/2018   Procedure: RIGHT TOTAL KNEE ARTHROPLASTY;  Surgeon: Valeria Batman, MD;  Location: [facility];   Service: Orthopedics;  Laterality: Right;   uterine biopsy     x 2 with no sedation   Social History   Occupational History   Occupation: Teacher, adult education: UNEMPLOYED  Tobacco Use   Smoking status: Never   Smokeless tobacco: Never  Vaping Use   Vaping status: Never Used  Substance and Sexual Activity   Alcohol use: Yes    Comment: 08/28/2018 "1-2 drinks/month". occas.   Drug use: Never   Sexual activity: Not Currently    Birth control/protection: Post-menopausal    Comment: menarche 85, G 1 age 37, menopause 58, HRT x 9 yrs

## 2023-06-13 ENCOUNTER — Other Ambulatory Visit (HOSPITAL_BASED_OUTPATIENT_CLINIC_OR_DEPARTMENT_OTHER): Payer: Self-pay | Admitting: Student

## 2023-06-13 MED ORDER — MELOXICAM 15 MG PO TABS: 15.0000 mg | ORAL_TABLET | Freq: Every day | ORAL | 0 refills | Status: AC

## 2023-06-13 NOTE — Telephone Encounter (Signed)
Spoke to pt and sent her some meloxicam as discussed previously. She is getting set up for epidural steroid injections and will continue following with Dr August Saucer.

## 2023-06-21 ENCOUNTER — Encounter: Payer: Self-pay | Admitting: Orthopedic Surgery

## 2023-06-22 ENCOUNTER — Other Ambulatory Visit: Payer: Self-pay

## 2023-06-22 DIAGNOSIS — M25551 Pain in right hip: Secondary | ICD-10-CM

## 2023-06-22 NOTE — Telephone Encounter (Signed)
Okay for MRI pelvis to evaluate for occult arthritis.  Thanks I think it is most likely this is coming from her back however.

## 2023-07-01 ENCOUNTER — Ambulatory Visit
Admission: RE | Admit: 2023-07-01 | Discharge: 2023-07-01 | Disposition: A | Discharge status: Home / Self Care | Payer: Medicare Other | Source: Ambulatory Visit | Attending: Orthopedic Surgery | Admitting: Orthopedic Surgery

## 2023-07-01 DIAGNOSIS — M25551 Pain in right hip: Secondary | ICD-10-CM

## 2023-07-03 ENCOUNTER — Ambulatory Visit: Payer: Medicare Other | Admitting: Physical Medicine and Rehabilitation

## 2023-07-03 ENCOUNTER — Other Ambulatory Visit: Payer: Self-pay

## 2023-07-03 VITALS — BP 151/79 | HR 39

## 2023-07-03 DIAGNOSIS — M48062 Spinal stenosis, lumbar region with neurogenic claudication: Secondary | ICD-10-CM

## 2023-07-03 DIAGNOSIS — M25551 Pain in right hip: Secondary | ICD-10-CM

## 2023-07-03 DIAGNOSIS — M5416 Radiculopathy, lumbar region: Secondary | ICD-10-CM | POA: Diagnosis not present

## 2023-07-03 DIAGNOSIS — M4316 Spondylolisthesis, lumbar region: Secondary | ICD-10-CM

## 2023-07-03 DIAGNOSIS — Q762 Congenital spondylolisthesis: Secondary | ICD-10-CM

## 2023-07-03 MED ORDER — METHYLPREDNISOLONE ACETATE 80 MG/ML IJ SUSP
80.0000 mg | Freq: Once | INTRAMUSCULAR | Status: AC
  Administered 2023-07-03: 80 mg

## 2023-07-03 NOTE — Patient Instructions (Signed)

## 2023-07-03 NOTE — Progress Notes (Signed)
Functional Pain Scale - descriptive words and definitions  Moderate (4)   Constantly aware of pain, can complete ADLs with modification/sleep marginally affected at times/passive distraction is of no use, but active distraction gives some relief. Moderate range order  Average Pain  9   +Driver, -BT, -Dye Allergies.  Lower back pain on the right side that radiates into the right hip and groin

## 2023-07-13 ENCOUNTER — Encounter: Payer: Self-pay | Admitting: Orthopedic Surgery

## 2023-07-14 ENCOUNTER — Telehealth: Payer: Self-pay

## 2023-07-14 NOTE — Telephone Encounter (Signed)
-----   Message from Burnard Bunting sent at 07/14/2023  9:18 AM EDT ----- I called.  She is going to try decreasing her activity level in the hip.  Realistically the issue here is she has joint effusion and osteochondral problem with the femoral head which is 1 source of pain and also the stress reaction in the socket whic h would be another source of pain.  I may have her see Dr. Deno Etienne or Dr. Magnus Ivan to evaluate whether or not they think hip replacement in that setting of a stress reaction in the acetabulum is advisable.  Please make her an appointment for the week of  September 5.  Thanks

## 2023-07-14 NOTE — Telephone Encounter (Signed)
Should the appointment be with you or Blackman/Xu?

## 2023-07-14 NOTE — Progress Notes (Signed)
I called.  She is going to try decreasing her activity level in the hip.  Realistically the issue here is she has joint effusion and osteochondral problem with the femoral head which is 1 source of pain and also the stress reaction in the socket which would be another source of pain.  I may have her see Dr. Deno Etienne or Dr. Magnus Ivan to evaluate whether or not they think hip replacement in that setting of a stress reaction in the acetabulum is advisable.  Please make her an appointment for the week of September 5.  Thanks

## 2023-07-14 NOTE — Telephone Encounter (Signed)
Hi Mike.  This lady is coming in to see me in 2 weeks.  She is having hip pain and back pain.  She got 3 to 4 days relief from hip injection and 5 days relief from a back injection.  My question to you is whether or not you think hip replacement is advisable in the face of the stress reaction that she has in her acetabulum.  Thanks for letting me know and if she needs it I will send her your way.  She does have a history of osteoporosis but takes the injections once or twice a year.

## 2023-07-14 NOTE — Telephone Encounter (Signed)
I'm happy to take a look at her.  I wonder if her pain relief was so short lived because her pain is mainly from the stress reaction and not as much from the OA.  I feel that most of them heal from the stress reaction and eventually feel better with nonop treatment and protected weight bearing and activity modification but by the looks of her history, she is in chronic pain and has multiple allergies...... Happy to see her if you want to send her my way.  Thanks.

## 2023-07-16 NOTE — Telephone Encounter (Signed)
Just with me o - if neede d I will send to xu but she may not need it thx

## 2023-07-16 NOTE — Telephone Encounter (Signed)
Ok thx for looking at it - would you put a cup into a stres reaction area if the oa was more severe, just academically curious

## 2023-07-16 NOTE — Telephone Encounter (Signed)
Yeah I wouldn't have an issue with that.  Some of those with AVN have edema down into the intertrochanteric region and I don't feel that it affects the fixation of the stem at all.  I would probably put a couple of screws in the cup if needed.

## 2023-07-17 NOTE — Telephone Encounter (Signed)
Ok thx.

## 2023-07-22 NOTE — Procedures (Signed)
Lumbar Epidural Steroid Injection - Interlaminar Approach with Fluoroscopic Guidance  Patient: [name]      Date of Birth: [birth year] MRN: [id number] PCP: Ollen Bowl, MD      Visit Date: 07/03/2023   Universal Protocol:     Consent Given By: the patient  Position: PRONE  Additional Comments: Vital signs were monitored before and after the procedure. Patient was prepped and draped in the usual sterile fashion. The correct patient, procedure, and site was verified.   Injection Procedure Details:   Procedure diagnoses: Lumbar radiculopathy [M54.16]   Meds Administered:  Meds ordered this encounter  Medications   methylPREDNISolone acetate (DEPO-MEDROL) injection 80 mg     Laterality: Right  Location/Site:  L5-S1  Needle: 3.5 in., 20 ga. Tuohy  Needle Placement: Paramedian epidural  Findings:   -Comments: Excellent flow of contrast into the epidural space.  Procedure Details: Using a paramedian approach from the side mentioned above, the region overlying the inferior lamina was localized under fluoroscopic visualization and the soft tissues overlying this structure were infiltrated with 4 ml. of 1% Lidocaine without Epinephrine. The Tuohy needle was inserted into the epidural space using a paramedian approach.   The epidural space was localized using loss of resistance along with counter oblique bi-planar fluoroscopic views.  After negative aspirate for air, blood, and CSF, a 2 ml. volume of Isovue-250 was injected into the epidural space and the flow of contrast was observed. Radiographs were obtained for documentation purposes.    The injectate was administered into the level noted above.   Additional Comments:  The patient tolerated the procedure well Dressing: 2 x 2 sterile gauze and Band-Aid    Post-procedure details: Patient was observed during the procedure. Post-procedure instructions were reviewed.  Patient left the clinic in stable  condition.

## 2023-07-22 NOTE — Progress Notes (Signed)
[name] - 77 y.o. female MRN [id number]  Date of birth: 01/16/1946  Office Visit Note: Visit Date: 07/03/2023 PCP: Ollen Bowl, MD Referred by: Ollen Bowl, MD  Subjective: Chief Complaint  Patient presents with   Lower Back - Pain   HPI:  [name] is a 77 y.o. female who comes in today at the request of Dr. Burnard Bunting for planned Right L5-S1 Lumbar Interlaminar epidural steroid injection with fluoroscopic guidance.  The patient has failed conservative care including home exercise, medications, time and activity modification.  This injection will be diagnostic and hopefully therapeutic.  Please see requesting physician notes for further details and justification.  She has someone that I have seen in the past and we used to attend the same church.  She had had injections in the past with some help but she has pretty degenerative spine with spondylolisthesis some curvature and facet arthropathy with various levels of foraminal narrowing in the lower lumbar region.  She however does not have anything in the L1 or L2 region on the right that she would think consistent more with the hip and groin pain.  She is going to follow-up with Dr. August Saucer and does have MRI of the pelvis pending.   ROS Otherwise per HPI.  Assessment & Plan: Visit Diagnoses:    ICD-10-CM   1. Lumbar radiculopathy  M54.16 XR C-ARM NO REPORT    Epidural Steroid injection    methylPREDNISolone acetate (DEPO-MEDROL) injection 80 mg    2. Spinal stenosis of lumbar region with neurogenic claudication  M48.062 XR C-ARM NO REPORT    Epidural Steroid injection    methylPREDNISolone acetate (DEPO-MEDROL) injection 80 mg    3. Pain in right hip  M25.551 XR C-ARM NO REPORT    Epidural Steroid injection    methylPREDNISolone acetate (DEPO-MEDROL) injection 80 mg    4. Congenital spondylolysis  Q76.2     5. Spondylolisthesis of lumbar region  M43.16       Plan: No additional findings.    Meds & Orders:  Meds ordered this encounter  Medications   methylPREDNISolone acetate (DEPO-MEDROL) injection 80 mg    Orders Placed This Encounter  Procedures   XR C-ARM NO REPORT   Epidural Steroid injection    Follow-up: Return for visit to requesting provider as needed.   Procedures: No procedures performed  Lumbar Epidural Steroid Injection - Interlaminar Approach with Fluoroscopic Guidance  Patient: [name]      Date of Birth: 08/28/1946 MRN: [id number] PCP: Ollen Bowl, MD      Visit Date: 07/03/2023   Universal Protocol:     Consent Given By: the patient  Position: PRONE  Additional Comments: Vital signs were monitored before and after the procedure. Patient was prepped and draped in the usual sterile fashion. The correct patient, procedure, and site was verified.   Injection Procedure Details:   Procedure diagnoses: Lumbar radiculopathy [M54.16]   Meds Administered:  Meds ordered this encounter  Medications   methylPREDNISolone acetate (DEPO-MEDROL) injection 80 mg     Laterality: Right  Location/Site:  L5-S1  Needle: 3.5 in., 20 ga. Tuohy  Needle Placement: Paramedian epidural  Findings:   -Comments: Excellent flow of contrast into the epidural space.  Procedure Details: Using a paramedian approach from the side mentioned above, the region overlying the inferior lamina was localized under fluoroscopic visualization and the soft tissues overlying this structure were infiltrated with 4 ml. of 1% Lidocaine without  Epinephrine. The Tuohy needle was inserted into the epidural space using a paramedian approach.   The epidural space was localized using loss of resistance along with counter oblique bi-planar fluoroscopic views.  After negative aspirate for air, blood, and CSF, a 2 ml. volume of Isovue-250 was injected into the epidural space and the flow of contrast was observed. Radiographs were obtained for documentation purposes.     The injectate was administered into the level noted above.   Additional Comments:  The patient tolerated the procedure well Dressing: 2 x 2 sterile gauze and Band-Aid    Post-procedure details: Patient was observed during the procedure. Post-procedure instructions were reviewed.  Patient left the clinic in stable condition.   Clinical History: INDICATION: Chronic right-sided low back pain, unspecified whether sciatica present. RIGHT groin and RIGHT thigh pain. Symptoms for 2 months.  STUDY: MRI of the lumbar spine without intravenous contrast performed on 05/22/2023 11:17 AM.  COMPARISON: No comparison available.  TECHNIQUE: Multiplanar, multisequence MR imaging obtained through the lumbar spine without contrast on 05/22/2023 11:17 AM.  CONTRAST: None.  FINDINGS:  # Osseous structures: Anterolateral osteophytes and endplate degenerative changes noted at multiple levels. Mixed Modic type I/III changes at L1-L2 and predominantly Modic type I changes at L3-L4. Modic type II changes at L2-L3 and L4-L5. Chronic compression deformities of the T11 and T12 vertebral bodies noted. No acute fracture appreciated.  #  Alignment:A dextrorotoscoliosis appreciated. Grade 1 anterolisthesis noted at L5-S1 with a pars interarticularis defect on the RIGHT at L5.  #  Conus medullaris/cauda equina: Spinal cord signal is normal and terminates at T12-L1. The cauda equina is unremarkable.  #  Lower thoracic spine: Disc desiccation with mild retropulsion of the superior endplate T11 and T12 resulting in mild effacement of ventral thecal sac. Neuroforamen are patent at T10-T11 and T11-T12. This is viewed on the sagittal images only.  #  T12-L1: Disc desiccation. No significant disc herniation, spinal canal, or neuroforaminal compromise. This is viewed on the sagittal images only.  #  L1-L2: Disc desiccation with severe loss of disc height and a disc bulge. Posterior lateral osteophytes noted. Ligamentum flavum  redundancy appreciated. Mild spinal canal/lateral recess narrowing and mild bilateral neuroforaminal stenosis present.  #  L2-L3: Disc desiccation with severe loss of disc height and a disc bulge. Posterior lateral osteophyte on the LEFT. Ligamentum flavum redundancy. This results in mild spinal canal/lateral recess narrowing and mild LEFT neuroforaminal stenosis.  #  L3-L4: Disc desiccation with moderate loss disc height and a disc bulge. Posterior lateral osteophyte on the LEFT. Facet arthrosis and ligamentum flavum redundancy. This results in moderate spinal canal and mild lateral recess narrowing. Mild bilateral neuroforaminal stenosis.  #  L4-L5: Disc desiccation with severe loss of disc height. Disc bulge and posterior lateral osteophytes. Facet arthrosis and ligamentum flavum redundancy appreciated. Small joint effusions noted. This results in moderate spinal canal and mild lateral recess narrowing. Severe RIGHT and moderate LEFT neuroforaminal stenosis.  #  L5-S1: Desiccation with a grade 1 anterolisthesis. There is uncovering of the disc. Bilateral facet arthrosis and ligamentum flavum redundancy. Bilateral joint effusions noted. This results in mild spinal canal/lateral recess narrowing. Moderate to severe RIGHT and moderate LEFT neuroforaminal stenosis.  #  Paraspinal tissues: Unremarkable.  #  Additional comments: None.   IMPRESSION:  1. Multilevel degenerative disc disease, facet arthrosis, dextrorotoscoliosis with a grade 1 anterolisthesis at L5-S1. Of note, there is a partial spondylolysis on the RIGHT at L5-S1. This is most notable for  moderate spinal canal and severe RIGHT and moderate LEFT neuroforaminal stenosis at L4-L5.  2.  Moderate to severe RIGHT and moderate LEFT neuroforaminal stenosis at L5-S1.  3.  Moderate spinal canal narrowing at L3-L4.  Electronically Signed by: Ardyth Man MD, PHD on 05/26/2023 8:34 AM     Objective:  VS:  HT:    WT:   BMI:     BP:(!) 151/79  HR:(!)  39bpm  TEMP: ( )  RESP:  Physical Exam Vitals and nursing note reviewed.  Constitutional:      General: She is not in acute distress.    Appearance: Normal appearance. She is not ill-appearing.  HENT:     Head: Normocephalic and atraumatic.     Right Ear: External ear normal.     Left Ear: External ear normal.  Eyes:     Extraocular Movements: Extraocular movements intact.  Cardiovascular:     Rate and Rhythm: Normal rate.     Pulses: Normal pulses.  Pulmonary:     Effort: Pulmonary effort is normal. No respiratory distress.  Abdominal:     General: There is no distension.     Palpations: Abdomen is soft.  Musculoskeletal:        General: Tenderness present.     Cervical back: Neck supple.     Right lower leg: No edema.     Left lower leg: No edema.     Comments: Patient has good distal strength with no pain over the greater trochanters.  No clonus or focal weakness.  Skin:    Findings: No erythema, lesion or rash.  Neurological:     General: No focal deficit present.     Mental Status: She is alert and oriented to person, place, and time.     Sensory: No sensory deficit.     Motor: No weakness or abnormal muscle tone.     Coordination: Coordination normal.  Psychiatric:        Mood and Affect: Mood normal.        Behavior: Behavior normal.      Imaging: No results found.

## 2023-07-31 ENCOUNTER — Ambulatory Visit: Payer: Medicare Other | Admitting: Orthopedic Surgery

## 2023-08-03 ENCOUNTER — Other Ambulatory Visit (HOSPITAL_BASED_OUTPATIENT_CLINIC_OR_DEPARTMENT_OTHER): Payer: Self-pay

## 2023-08-03 MED ORDER — FLUAD 0.5 ML IM SUSY
0.5000 mL | PREFILLED_SYRINGE | Freq: Once | INTRAMUSCULAR | 0 refills | Status: AC
  Filled 2023-08-03: qty 0.5, 1d supply, fill #0

## 2023-08-11 ENCOUNTER — Encounter: Payer: Self-pay | Admitting: Orthopedic Surgery

## 2023-08-11 ENCOUNTER — Ambulatory Visit: Payer: Medicare Other | Admitting: Orthopedic Surgery

## 2023-08-11 DIAGNOSIS — M5416 Radiculopathy, lumbar region: Secondary | ICD-10-CM

## 2023-08-11 MED ORDER — MELOXICAM 15 MG PO TABS: 15.0000 mg | ORAL_TABLET | Freq: Every day | ORAL | 2 refills | Status: DC

## 2023-08-11 NOTE — Progress Notes (Signed)
Office Visit Note   Patient: [name]           Date of Birth: Jan 20, 1946           MRN: [id number] Visit Date: 08/11/2023 Requested by: Ollen Bowl, MD [address] [place],  [place] [address] PCP: Ollen Bowl, MD  Subjective: Chief Complaint  Patient presents with   Other     Review MRI     HPI: [name] is a 77 y.o. female who presents to the office reporting improved right hip pain and pelvic pain.  MRI lumbar spine ordered but not done yet.  Overall she is changed her activity and she feels like her hip is doing better.  She states in April she could hardly walk but now she is doing better.  Taking Mobic and Tylenol.  States that she does not walk with a limp.  MRI scan is reviewed with the patient.  She does have what appears to be stress reaction in the acetabulum along with a small osteochondral lesion on the lateral aspect of the femoral head.  Not really in the weightbearing region..                ROS: All systems reviewed are negative as they relate to the chief complaint within the history of present illness.  Patient denies fevers or chills.  Assessment & Plan: Visit Diagnoses:  1. Lumbar radiculopathy     Plan: Impression is improved right hip pain following activity modification and anti-inflammatories.  Hip is not really irritable at this time.  When she does do prolonged walking she does develop a little groin pain.  MRI lumbar spine pending which I think is less likely to show pathology.  We will see how she does on an as-needed basis but could consider right hip injection if her symptoms progress.  She did have that done 1 time before which only gave her 3 days of relief.  She needs to be careful that she does not get too aggressive with her activity based on the MRI of her pelvis.  I can call her or she can come by again once the MRI of the lumbar spine is performed.  Follow-Up Instructions: No follow-ups on file.    Orders:  No orders of the defined types were placed in this encounter.  Meds ordered this encounter  Medications   meloxicam (MOBIC) 15 MG tablet    Sig: Take 1 tablet (15 mg total) by mouth daily.    Dispense:  30 tablet    Refill:  2      Procedures: No procedures performed   Clinical Data: No additional findings.  Objective: Vital Signs: There were no vitals taken for this visit.  Physical Exam:  Constitutional: Patient appears well-developed HEENT:  Head: Normocephalic Eyes:EOM are normal Neck: Normal range of motion Cardiovascular: Normal rate Pulmonary/chest: Effort normal Neurologic: Patient is alert Skin: Skin is warm Psychiatric: Patient has normal mood and affect  Ortho Exam: Ortho exam demonstrates normal gait alignment.  No real groin pain with internal/external rotation of either leg.  No nerve root tension signs.  5 out of 5 ankle dorsiflexion plantarflexion quad hamstring strength with symmetric and equal hip flexion adduction and abduction strength.  No trochanteric tenderness.  No paresthesias in that right leg.  Mobic refilled today.  To be taken on an as needed basis. Specialty Comments:  INDICATION: Chronic right-sided low back pain, unspecified whether sciatica present.  RIGHT groin and RIGHT thigh pain. Symptoms for 2 months.  STUDY: MRI of the lumbar spine without intravenous contrast performed on 05/22/2023 11:17 AM.  COMPARISON: No comparison available.  TECHNIQUE: Multiplanar, multisequence MR imaging obtained through the lumbar spine without contrast on 05/22/2023 11:17 AM.  CONTRAST: None.  FINDINGS:  # Osseous structures: Anterolateral osteophytes and endplate degenerative changes noted at multiple levels. Mixed Modic type I/III changes at L1-L2 and predominantly Modic type I changes at L3-L4. Modic type II changes at L2-L3 and L4-L5. Chronic compression deformities of the T11 and T12 vertebral bodies noted. No acute fracture appreciated.  #   Alignment:A dextrorotoscoliosis appreciated. Grade 1 anterolisthesis noted at L5-S1 with a pars interarticularis defect on the RIGHT at L5.  #  Conus medullaris/cauda equina: Spinal cord signal is normal and terminates at T12-L1. The cauda equina is unremarkable.  #  Lower thoracic spine: Disc desiccation with mild retropulsion of the superior endplate T11 and T12 resulting in mild effacement of ventral thecal sac. Neuroforamen are patent at T10-T11 and T11-T12. This is viewed on the sagittal images only.  #  T12-L1: Disc desiccation. No significant disc herniation, spinal canal, or neuroforaminal compromise. This is viewed on the sagittal images only.  #  L1-L2: Disc desiccation with severe loss of disc height and a disc bulge. Posterior lateral osteophytes noted. Ligamentum flavum redundancy appreciated. Mild spinal canal/lateral recess narrowing and mild bilateral neuroforaminal stenosis present.  #  L2-L3: Disc desiccation with severe loss of disc height and a disc bulge. Posterior lateral osteophyte on the LEFT. Ligamentum flavum redundancy. This results in mild spinal canal/lateral recess narrowing and mild LEFT neuroforaminal stenosis.  #  L3-L4: Disc desiccation with moderate loss disc height and a disc bulge. Posterior lateral osteophyte on the LEFT. Facet arthrosis and ligamentum flavum redundancy. This results in moderate spinal canal and mild lateral recess narrowing. Mild bilateral neuroforaminal stenosis.  #  L4-L5: Disc desiccation with severe loss of disc height. Disc bulge and posterior lateral osteophytes. Facet arthrosis and ligamentum flavum redundancy appreciated. Small joint effusions noted. This results in moderate spinal canal and mild lateral recess narrowing. Severe RIGHT and moderate LEFT neuroforaminal stenosis.  #  L5-S1: Desiccation with a grade 1 anterolisthesis. There is uncovering of the disc. Bilateral facet arthrosis and ligamentum flavum redundancy. Bilateral joint  effusions noted. This results in mild spinal canal/lateral recess narrowing. Moderate to severe RIGHT and moderate LEFT neuroforaminal stenosis.  #  Paraspinal tissues: Unremarkable.  #  Additional comments: None.   IMPRESSION:  1. Multilevel degenerative disc disease, facet arthrosis, dextrorotoscoliosis with a grade 1 anterolisthesis at L5-S1. Of note, there is a partial spondylolysis on the RIGHT at L5-S1. This is most notable for moderate spinal canal and severe RIGHT and moderate LEFT neuroforaminal stenosis at L4-L5.  2.  Moderate to severe RIGHT and moderate LEFT neuroforaminal stenosis at L5-S1.  3.  Moderate spinal canal narrowing at L3-L4.  Electronically Signed by: Ardyth Man MD, PHD on 05/26/2023 8:34 AM  Imaging: No results found.   PMFS History: Patient Active Problem List   Diagnosis Date Noted   Closed fracture of right distal radius 07/12/2022   Calculus of kidney 12/09/2021   Osteoporosis 12/07/2021   Nonrheumatic mitral valve regurgitation 04/07/2020   Palpitations 04/07/2020   Educated about COVID-19 virus infection 04/07/2020   Pain in right knee 02/20/2020   Kidney stone 08/12/2019   PE (pulmonary thromboembolism) (HCC) 10/03/2018   Macrocytic anemia 10/03/2018   Unilateral primary osteoarthritis, right  knee 08/28/2018   History of total right knee replacement 08/28/2018   Bilateral primary osteoarthritis of knee 03/12/2018   Essential hypertension 08/26/2016   Mitral valve disorder 08/26/2016   History of palpitations 08/26/2016   Dyslipidemia 08/26/2016   Internal hemorrhoids 08/03/2015   Hx of radiation therapy    Breast cancer of upper-outer quadrant of right female breast (HCC) 05/23/2013   Personal history of colonic polyps 01/06/2012   Irritable bowel syndrome - constipation predominant 01/06/2012   Past Medical History:  Diagnosis Date   Allergic rhinitis    Allergy    Arthritis    "knees" (08/28/2018)   Breast cancer, right breast (HCC)  05/20/2013   "no chemo" (08/28/2018)   Chronic lower back pain    Clotting disorder (HCC)    Diverticulosis    DOE (dyspnea on exertion)    Dysrhythmia    GERD (gastroesophageal reflux disease)    uses levsin once every 2-3 months   Graves' disease    2011   Heart palpitations    History of kidney stones    HLD (hyperlipidemia)    Hx of radiation therapy 08/13/13- 09/06/13   right breast 4250 cGy 17 sessions   Hypertension    controlled   IBS (irritable bowel syndrome)    Lumbar herniated disc    L4-L5   Major depression    MVP (mitral valve prolapse)    Osteopenia    Osteoporosis    zometa infusion 11/2021- is yearly infusion   Personal history of radiation therapy    Pneumonia 08/2019   PONV (postoperative nausea and vomiting)    "when I have general anesthetic" (08/28/2018)   Pulmonary emboli (HCC)    after knee replacement   PVC (premature ventricular contraction)    Takes metoprolol PRN for  pvc's   Tubular adenoma of colon 03/07/2012   Uterine hyperplasia    vaginal [place] every 6 months and had 2 biopsies so far with chnages noted    Family History  Problem Relation Age of Onset   Heart disease Mother 21       MI   Lung cancer Mother    Hypertension Mother    Alcohol abuse Father    COPD Father    Breast cancer Maternal Aunt    Pancreatic cancer Maternal Aunt    Colon cancer Maternal Grandfather 23   Diabetes Paternal Grandmother    Stomach cancer Neg Hx    Colon polyps Neg Hx    Esophageal cancer Neg Hx    Rectal cancer Neg Hx     Past Surgical History:  Procedure Laterality Date   BREAST BIOPSY Right 2014   BREAST EXCISIONAL BIOPSY Right    BREAST LUMPECTOMY Right 2014   BREAST LUMPECTOMY WITH NEEDLE LOCALIZATION AND AXILLARY SENTINEL LYMPH NODE BX Right 06/11/2013   Procedure: BREAST LUMPECTOMY WITH NEEDLE LOCALIZATION AND AXILLARY SENTINEL LYMPH NODE BX;  Surgeon: Shelly Rubenstein, MD;  Location: [facility];  Service: General;  Laterality: Right;  Needle  localization BCG 7:30   COLONOSCOPY     COLONOSCOPY W/ BIOPSIES AND POLYPECTOMY  multiple   CYSTOSCOPY WITH STENT PLACEMENT Left 08/15/2019   Procedure: CYSTOSCOPY, left retrograde pyleogram  WITH STENT EXCHANGE left and foley placement;  Surgeon: Marcine Matar, MD;  Location: [facility];  Service: Urology;  Laterality: Left;   IR URETERAL STENT LEFT NEW ACCESS W/O SEP NEPHROSTOMY CATH  08/12/2019   IR URETERAL STENT LEFT NEW ACCESS W/O SEP NEPHROSTOMY CATH  12/09/2021  KNEE ARTHROSCOPY Left 2006   NEPHROLITHOTOMY Left 08/12/2019   Procedure: NEPHROLITHOTOMY PERCUTANEOUS/INSERTION DOUBLE J STENT;  Surgeon: Marcine Matar, MD;  Location: [facility];  Service: Urology;  Laterality: Left;  2 HRS   NEPHROLITHOTOMY Left 12/09/2021   Procedure: NEPHROLITHOTOMY PERCUTANEOUS;  Surgeon: Marcine Matar, MD;  Location: [facility];  Service: Urology;  Laterality: Left;  2 HRS   POLYPECTOMY     TOTAL KNEE ARTHROPLASTY Right 08/28/2018   Procedure: RIGHT TOTAL KNEE ARTHROPLASTY;  Surgeon: Valeria Batman, MD;  Location: [facility];  Service: Orthopedics;  Laterality: Right;   uterine biopsy     x 2 with no sedation   Social History   Occupational History   Occupation: Teacher, adult education: UNEMPLOYED  Tobacco Use   Smoking status: Never   Smokeless tobacco: Never  Vaping Use   Vaping status: Never Used  Substance and Sexual Activity   Alcohol use: Yes    Comment: 08/28/2018 "1-2 drinks/month". occas.   Drug use: Never   Sexual activity: Not Currently    Birth control/protection: Post-menopausal    Comment: menarche 35, G 1 age 50, menopause 72, HRT x 9 yrs

## 2023-09-02 ENCOUNTER — Encounter: Payer: Self-pay | Admitting: Physical Medicine and Rehabilitation

## 2023-09-19 ENCOUNTER — Telehealth: Payer: Self-pay

## 2023-09-19 NOTE — Telephone Encounter (Signed)
LVM to return call to schedule ov

## 2023-09-20 ENCOUNTER — Telehealth: Payer: Self-pay | Admitting: Physical Medicine and Rehabilitation

## 2023-09-20 NOTE — Telephone Encounter (Signed)
Patient called returning your call. NU#[phone]

## 2023-09-21 NOTE — Telephone Encounter (Signed)
LVM to return call to schedule ov

## 2023-09-28 ENCOUNTER — Telehealth: Payer: Self-pay | Admitting: Physical Medicine and Rehabilitation

## 2023-09-28 NOTE — Telephone Encounter (Signed)
Patient called back to make an appointment. She said its been three weeks that she has been going back and forth to get an appointment .[phone]

## 2023-10-02 ENCOUNTER — Encounter: Payer: Self-pay | Admitting: Physical Medicine and Rehabilitation

## 2023-10-02 ENCOUNTER — Ambulatory Visit: Payer: Medicare Other | Admitting: Physical Medicine and Rehabilitation

## 2023-10-02 VITALS — BP 140/71 | HR 47

## 2023-10-02 DIAGNOSIS — M48062 Spinal stenosis, lumbar region with neurogenic claudication: Secondary | ICD-10-CM

## 2023-10-02 DIAGNOSIS — M4316 Spondylolisthesis, lumbar region: Secondary | ICD-10-CM

## 2023-10-02 DIAGNOSIS — M5416 Radiculopathy, lumbar region: Secondary | ICD-10-CM

## 2023-10-02 NOTE — Progress Notes (Unsigned)
Wants to see about having another lower back injection. "Complicated" per patient. Right hip, groin, and thigh pain. She sees Dr.Dean for this pain.  No numbness or tingling in either leg. She takes Advil as needed.  Pain gets worse with walking.   Functional Pain Scale - descriptive words and definitions  Moderate (4)   Constantly aware of pain, can complete ADLs with modification/sleep marginally affected at times/passive distraction is of no use, but active distraction gives some relief. Moderate range order  Average Pain 8   -Driver, -BT, -Dye Allergies.

## 2023-10-02 NOTE — Progress Notes (Unsigned)
[name] - 77 y.o. female MRN [id number]  Date of birth: 1946-07-25  Office Visit Note: Visit Date: 10/02/2023 PCP: Ollen Bowl, MD Referred by: Ollen Bowl, MD  Subjective: Chief Complaint  Patient presents with   Lower Back - Pain   HPI: [name] is a 77 y.o. female who comes in today for evaluation of chronic, worsening and severe bilateral lower back pain radiating to buttocks. Pain ongoing for several years, worsens with standing and walking. She describes pain sore and aching sensation, currently rates as 8 out of 10. Significant relief of pain with sitting. Some relief of pain with home exercise regimen, rest and use of medications. History of formal physical therapy with minimal relief of pain. Lumbar MRI imaging from July at [facility] exhibits multi level degenerative changes. There is grade 1 anterolisthesis of L5-S1, pars defect on the right at L5 and moderate spinal canal stenosis at L4-L5. She underwent right L5-S1 interlaminar epidural steroid injection in our office on 07/03/2023, she reports greater than 50% relief of pain with this procedure for 2 plus weeks. Patient has history of chronic right hip/groin pain, she is managed by Dr. Dorene Grebe from orthopedic standpoint. States she has a trip to [place] coming up in the next couple of weeks, she is requesting short course of oral Prednisone to take with her. Patient denies focal weakness, numbness and tingling. No recent trauma or falls.      Review of Systems  Musculoskeletal:  Positive for back pain.  Neurological:  Negative for tingling, sensory change, focal weakness and weakness.  All other systems reviewed and are negative.  Otherwise per HPI.  Assessment & Plan: Visit Diagnoses:    ICD-10-CM   1. Lumbar radiculopathy  M54.16 Ambulatory referral to Physical Medicine Rehab    2. Anterolisthesis of lumbar spine  M43.16 Ambulatory referral to Physical Medicine Rehab    3.  Spinal stenosis of lumbar region with neurogenic claudication  M48.062 Ambulatory referral to Physical Medicine Rehab       Plan: Findings:  Chronic, worsening and severe bilateral lower back pain radiating to buttocks.  Patient continues to have severe pain despite good conservative therapy such as formal physical therapy, home exercise regimen, rest and use of medications.  Patient's clinical presentation and exam are complex, differentials include neurogenic claudication as result of spinal canal stenosis versus facet mediated pain.  Lumbar MRI imaging does not multiple findings including moderate spinal canal stenosis at L4-L5. We discussed treatment plan in detail today.  Next step is to perform diagnostic and hopefully therapeutic L4-L5 interlaminar epidural steroid injection under fluoroscopic guidance.  She is not currently taking anticoagulant medication.  If good relief of pain with this injection we can repeat infrequently as needed.  I discussed injection procedure with her today in detail and explained to her that we would like to try injection at the level of stenosis. Should her pain declare itself as more axial lower back we would consider performing facet joint injections and possible radiofrequency ablation.  I did call in short course of oral prednisone for her to take on trip.  I assured her we would try her best to get her in quickly before she leaves, should she need her injection soon I am happy to place order for [facility]. She has no questions at this time.  I encouraged her to remain active as tolerated.  No red flag symptoms noted upon exam today.   Screening for  Osteoporosis for Women Aged 36-46 Years of Age:    Patient has had central dual-energy X-ray absorptiometry (DXA) to check for osteoporosis.   Today's note sent to PCP on record. Patient does not need referral to [facility] osteoporosis clinic.      Meds & Orders: No orders of the defined types were placed  in this encounter.   Orders Placed This Encounter  Procedures   Ambulatory referral to Physical Medicine Rehab    Follow-up: Return for L4-L5 interlaminar epidural steroid injection.   Procedures: No procedures performed      Clinical History: INDICATION: Chronic right-sided low back pain, unspecified whether sciatica present. RIGHT groin and RIGHT thigh pain. Symptoms for 2 months.  STUDY: MRI of the lumbar spine without intravenous contrast performed on 05/22/2023 11:17 AM.  COMPARISON: No comparison available.  TECHNIQUE: Multiplanar, multisequence MR imaging obtained through the lumbar spine without contrast on 05/22/2023 11:17 AM.  CONTRAST: None.  FINDINGS:  # Osseous structures: Anterolateral osteophytes and endplate degenerative changes noted at multiple levels. Mixed Modic type I/III changes at L1-L2 and predominantly Modic type I changes at L3-L4. Modic type II changes at L2-L3 and L4-L5. Chronic compression deformities of the T11 and T12 vertebral bodies noted. No acute fracture appreciated.  #  Alignment:A dextrorotoscoliosis appreciated. Grade 1 anterolisthesis noted at L5-S1 with a pars interarticularis defect on the RIGHT at L5.  #  Conus medullaris/cauda equina: Spinal cord signal is normal and terminates at T12-L1. The cauda equina is unremarkable.  #  Lower thoracic spine: Disc desiccation with mild retropulsion of the superior endplate T11 and T12 resulting in mild effacement of ventral thecal sac. Neuroforamen are patent at T10-T11 and T11-T12. This is viewed on the sagittal images only.  #  T12-L1: Disc desiccation. No significant disc herniation, spinal canal, or neuroforaminal compromise. This is viewed on the sagittal images only.  #  L1-L2: Disc desiccation with severe loss of disc height and a disc bulge. Posterior lateral osteophytes noted. Ligamentum flavum redundancy appreciated. Mild spinal canal/lateral recess narrowing and mild bilateral neuroforaminal stenosis  present.  #  L2-L3: Disc desiccation with severe loss of disc height and a disc bulge. Posterior lateral osteophyte on the LEFT. Ligamentum flavum redundancy. This results in mild spinal canal/lateral recess narrowing and mild LEFT neuroforaminal stenosis.  #  L3-L4: Disc desiccation with moderate loss disc height and a disc bulge. Posterior lateral osteophyte on the LEFT. Facet arthrosis and ligamentum flavum redundancy. This results in moderate spinal canal and mild lateral recess narrowing. Mild bilateral neuroforaminal stenosis.  #  L4-L5: Disc desiccation with severe loss of disc height. Disc bulge and posterior lateral osteophytes. Facet arthrosis and ligamentum flavum redundancy appreciated. Small joint effusions noted. This results in moderate spinal canal and mild lateral recess narrowing. Severe RIGHT and moderate LEFT neuroforaminal stenosis.  #  L5-S1: Desiccation with a grade 1 anterolisthesis. There is uncovering of the disc. Bilateral facet arthrosis and ligamentum flavum redundancy. Bilateral joint effusions noted. This results in mild spinal canal/lateral recess narrowing. Moderate to severe RIGHT and moderate LEFT neuroforaminal stenosis.  #  Paraspinal tissues: Unremarkable.  #  Additional comments: None.   IMPRESSION:  1. Multilevel degenerative disc disease, facet arthrosis, dextrorotoscoliosis with a grade 1 anterolisthesis at L5-S1. Of note, there is a partial spondylolysis on the RIGHT at L5-S1. This is most notable for moderate spinal canal and severe RIGHT and moderate LEFT neuroforaminal stenosis at L4-L5.  2.  Moderate to severe RIGHT and moderate LEFT neuroforaminal  stenosis at L5-S1.  3.  Moderate spinal canal narrowing at L3-L4.  Electronically Signed by: Ardyth Man MD, PHD on 05/26/2023 8:34 AM   She reports that she has never smoked. She has never used smokeless tobacco. No results for input(s): "HGBA1C", "LABURIC" in the last 8760 hours.  Objective:  VS:  HT:    WT:    BMI:     BP:(!) 140/71  HR:(!) 47bpm  TEMP: ( )  RESP:  Physical Exam Vitals and nursing note reviewed.  HENT:     Head: Normocephalic and atraumatic.     Right Ear: External ear normal.     Left Ear: External ear normal.     Nose: Nose normal.     Mouth/Throat:     Mouth: Mucous membranes are moist.  Eyes:     Extraocular Movements: Extraocular movements intact.  Cardiovascular:     Rate and Rhythm: Normal rate.     Pulses: Normal pulses.  Pulmonary:     Effort: Pulmonary effort is normal.  Abdominal:     General: Abdomen is flat. There is no distension.  Musculoskeletal:        General: Tenderness present.     Cervical back: Normal range of motion.     Comments: Patient rises from seated position to standing without difficulty. Good lumbar range of motion. No pain noted with facet loading. 5/5 strength noted with bilateral hip flexion, knee flexion/extension, ankle dorsiflexion/plantarflexion and EHL. No clonus noted bilaterally. No pain upon palpation of greater trochanters. No pain with internal/external rotation of bilateral hips. Sensation intact bilaterally. Negative slump test bilaterally. Ambulates without aid, gait steady.     Skin:    General: Skin is warm and dry.     Capillary Refill: Capillary refill takes less than 2 seconds.  Neurological:     General: No focal deficit present.     Mental Status: She is alert and oriented to person, place, and time.  Psychiatric:        Mood and Affect: Mood normal.        Behavior: Behavior normal.     Ortho Exam  Imaging: No results found.  Past Medical/Family/Surgical/Social History: Medications & Allergies reviewed per EMR, new medications updated. Patient Active Problem List   Diagnosis Date Noted   Closed fracture of right distal radius 07/12/2022   Calculus of kidney 12/09/2021   Osteoporosis 12/07/2021   Nonrheumatic mitral valve regurgitation 04/07/2020   Palpitations 04/07/2020   Educated about COVID-19  virus infection 04/07/2020   Pain in right knee 02/20/2020   Kidney stone 08/12/2019   PE (pulmonary thromboembolism) (HCC) 10/03/2018   Macrocytic anemia 10/03/2018   Unilateral primary osteoarthritis, right knee 08/28/2018   History of total right knee replacement 08/28/2018   Bilateral primary osteoarthritis of knee 03/12/2018   Essential hypertension 08/26/2016   Mitral valve disorder 08/26/2016   History of palpitations 08/26/2016   Dyslipidemia 08/26/2016   Internal hemorrhoids 08/03/2015   Hx of radiation therapy    Breast cancer of upper-outer quadrant of right female breast (HCC) 05/23/2013   History of colonic polyps 01/06/2012   Irritable bowel syndrome - constipation predominant 01/06/2012   Past Medical History:  Diagnosis Date   Allergic rhinitis    Allergy    Arthritis    "knees" (08/28/2018)   Breast cancer, right breast (HCC) 05/20/2013   "no chemo" (08/28/2018)   Chronic lower back pain    Clotting disorder (HCC)    Diverticulosis    DOE (  dyspnea on exertion)    Dysrhythmia    GERD (gastroesophageal reflux disease)    uses levsin once every 2-3 months   Graves' disease    2011   Heart palpitations    History of kidney stones    HLD (hyperlipidemia)    Hx of radiation therapy 08/13/13- 09/06/13   right breast 4250 cGy 17 sessions   Hypertension    controlled   IBS (irritable bowel syndrome)    Lumbar herniated disc    L4-L5   Major depression    MVP (mitral valve prolapse)    Osteopenia    Osteoporosis    zometa infusion 11/2021- is yearly infusion   Personal history of radiation therapy    Pneumonia 08/2019   PONV (postoperative nausea and vomiting)    "when I have general anesthetic" (08/28/2018)   Pulmonary emboli (HCC)    after knee replacement   PVC (premature ventricular contraction)    Takes metoprolol PRN for  pvc's   Tubular adenoma of colon 03/07/2012   Uterine hyperplasia    vaginal [place] every 6 months and had 2 biopsies so far with  chnages noted   Family History  Problem Relation Age of Onset   Heart disease Mother 33       MI   Lung cancer Mother    Hypertension Mother    Alcohol abuse Father    COPD Father    Breast cancer Maternal Aunt    Pancreatic cancer Maternal Aunt    Colon cancer Maternal Grandfather 53   Diabetes Paternal Grandmother    Stomach cancer Neg Hx    Colon polyps Neg Hx    Esophageal cancer Neg Hx    Rectal cancer Neg Hx    Past Surgical History:  Procedure Laterality Date   BREAST BIOPSY Right 2014   BREAST EXCISIONAL BIOPSY Right    BREAST LUMPECTOMY Right 2014   BREAST LUMPECTOMY WITH NEEDLE LOCALIZATION AND AXILLARY SENTINEL LYMPH NODE BX Right 06/11/2013   Procedure: BREAST LUMPECTOMY WITH NEEDLE LOCALIZATION AND AXILLARY SENTINEL LYMPH NODE BX;  Surgeon: Shelly Rubenstein, MD;  Location: [facility] OR;  Service: General;  Laterality: Right;  Needle localization BCG 7:30   COLONOSCOPY     COLONOSCOPY W/ BIOPSIES AND POLYPECTOMY  multiple   CYSTOSCOPY WITH STENT PLACEMENT Left 08/15/2019   Procedure: CYSTOSCOPY, left retrograde pyleogram  WITH STENT EXCHANGE left and foley placement;  Surgeon: Marcine Matar, MD;  Location: [facility];  Service: Urology;  Laterality: Left;   IR URETERAL STENT LEFT NEW ACCESS W/O SEP NEPHROSTOMY CATH  08/12/2019   IR URETERAL STENT LEFT NEW ACCESS W/O SEP NEPHROSTOMY CATH  12/09/2021   KNEE ARTHROSCOPY Left 2006   NEPHROLITHOTOMY Left 08/12/2019   Procedure: NEPHROLITHOTOMY PERCUTANEOUS/INSERTION DOUBLE J STENT;  Surgeon: Marcine Matar, MD;  Location: [facility];  Service: Urology;  Laterality: Left;  2 HRS   NEPHROLITHOTOMY Left 12/09/2021   Procedure: NEPHROLITHOTOMY PERCUTANEOUS;  Surgeon: Marcine Matar, MD;  Location: [facility];  Service: Urology;  Laterality: Left;  2 HRS   POLYPECTOMY     TOTAL KNEE ARTHROPLASTY Right 08/28/2018   Procedure: RIGHT TOTAL KNEE ARTHROPLASTY;  Surgeon: Valeria Batman, MD;  Location: [facility] OR;  Service: Orthopedics;   Laterality: Right;   uterine biopsy     x 2 with no sedation   Social History   Occupational History   Occupation: Teacher, adult education: UNEMPLOYED  Tobacco Use   Smoking status: Never   Smokeless tobacco: Never  Vaping Use   Vaping status: Never Used  Substance and Sexual Activity   Alcohol use: Yes    Comment: 08/28/2018 "1-2 drinks/month". occas.   Drug use: Never   Sexual activity: Not Currently    Birth control/protection: Post-menopausal    Comment: menarche 6, G 1 age 51, menopause 46, HRT x 9 yrs

## 2023-10-06 ENCOUNTER — Other Ambulatory Visit: Payer: Self-pay | Admitting: Physical Medicine and Rehabilitation

## 2023-10-06 MED ORDER — PREDNISONE 50 MG PO TABS: 50.0000 mg | ORAL_TABLET | Freq: Every day | ORAL | 0 refills | Status: DC

## 2023-10-11 ENCOUNTER — Other Ambulatory Visit: Payer: Self-pay

## 2023-10-11 ENCOUNTER — Ambulatory Visit: Payer: Medicare Other | Admitting: Physical Medicine and Rehabilitation

## 2023-10-11 VITALS — BP 122/80 | HR 56

## 2023-10-11 DIAGNOSIS — M5416 Radiculopathy, lumbar region: Secondary | ICD-10-CM | POA: Diagnosis not present

## 2023-10-11 MED ORDER — METHYLPREDNISOLONE ACETATE 40 MG/ML IJ SUSP
40.0000 mg | Freq: Once | INTRAMUSCULAR | Status: AC
  Administered 2023-10-11: 40 mg

## 2023-10-11 NOTE — Progress Notes (Signed)
Functional Pain Scale - descriptive words and definitions  Uncomfortable (3)  Pain is present but can complete all ADL's/sleep is slightly affected and passive distraction only gives marginal relief. Mild range order  Average Pain 3-10, depending activity and time of day (worse first a.m. and at bedtime)   +Driver, -BT, -Dye Allergies.

## 2023-10-11 NOTE — Patient Instructions (Signed)

## 2023-10-18 NOTE — Procedures (Signed)
Lumbar Epidural Steroid Injection - Interlaminar Approach with Fluoroscopic Guidance  Patient: [name]      Date of Birth: [birth year] MRN: [id number] PCP: Ollen Bowl, MD      Visit Date: 10/11/2023   Universal Protocol:     Consent Given By: the patient  Position: PRONE  Additional Comments: Vital signs were monitored before and after the procedure. Patient was prepped and draped in the usual sterile fashion. The correct patient, procedure, and site was verified.   Injection Procedure Details:   Procedure diagnoses: Lumbar radiculopathy [M54.16]   Meds Administered:  Meds ordered this encounter  Medications   methylPREDNISolone acetate (DEPO-MEDROL) injection 40 mg     Laterality: Right  Location/Site:  L4-5  Needle: 3.5 in., 20 ga. Tuohy  Needle Placement: Paramedian epidural  Findings:   -Comments: Excellent flow of contrast into the epidural space.  Procedure Details: Using a paramedian approach from the side mentioned above, the region overlying the inferior lamina was localized under fluoroscopic visualization and the soft tissues overlying this structure were infiltrated with 4 ml. of 1% Lidocaine without Epinephrine. The Tuohy needle was inserted into the epidural space using a paramedian approach.   The epidural space was localized using loss of resistance along with counter oblique bi-planar fluoroscopic views.  After negative aspirate for air, blood, and CSF, a 2 ml. volume of Isovue-250 was injected into the epidural space and the flow of contrast was observed. Radiographs were obtained for documentation purposes.    The injectate was administered into the level noted above.   Additional Comments:  No complications occurred Dressing: 2 x 2 sterile gauze and Band-Aid    Post-procedure details: Patient was observed during the procedure. Post-procedure instructions were reviewed.  Patient left the clinic in stable condition.

## 2023-10-18 NOTE — Progress Notes (Signed)
[name] - 77 y.o. female MRN [id number]  Date of birth: 09-Jun-1946  Office Visit Note: Visit Date: 10/11/2023 PCP: Ollen Bowl, MD Referred by: Ollen Bowl, MD  Subjective: Chief Complaint  Patient presents with   Lower Back - Pain   HPI:  [name] is a 77 y.o. female who comes in today at the request of Ellin Goodie, FNP for planned Right L4-5 Lumbar Interlaminar epidural steroid injection with fluoroscopic guidance.  The patient has failed conservative care including home exercise, medications, time and activity modification.  This injection will be diagnostic and hopefully therapeutic.  Please see requesting physician notes for further details and justification.   ROS Otherwise per HPI.  Assessment & Plan: Visit Diagnoses:    ICD-10-CM   1. Lumbar radiculopathy  M54.16 XR C-ARM NO REPORT    Epidural Steroid injection    methylPREDNISolone acetate (DEPO-MEDROL) injection 40 mg      Plan: No additional findings.   Meds & Orders:  Meds ordered this encounter  Medications   methylPREDNISolone acetate (DEPO-MEDROL) injection 40 mg    Orders Placed This Encounter  Procedures   XR C-ARM NO REPORT   Epidural Steroid injection    Follow-up: Return for visit to requesting provider as needed.   Procedures: No procedures performed  Lumbar Epidural Steroid Injection - Interlaminar Approach with Fluoroscopic Guidance  Patient: [name]      Date of Birth: [birth year] MRN: [id number] PCP: Ollen Bowl, MD      Visit Date: 10/11/2023   Universal Protocol:     Consent Given By: the patient  Position: PRONE  Additional Comments: Vital signs were monitored before and after the procedure. Patient was prepped and draped in the usual sterile fashion. The correct patient, procedure, and site was verified.   Injection Procedure Details:   Procedure diagnoses: Lumbar radiculopathy [M54.16]   Meds Administered:  Meds  ordered this encounter  Medications   methylPREDNISolone acetate (DEPO-MEDROL) injection 40 mg     Laterality: Right  Location/Site:  L4-5  Needle: 3.5 in., 20 ga. Tuohy  Needle Placement: Paramedian epidural  Findings:   -Comments: Excellent flow of contrast into the epidural space.  Procedure Details: Using a paramedian approach from the side mentioned above, the region overlying the inferior lamina was localized under fluoroscopic visualization and the soft tissues overlying this structure were infiltrated with 4 ml. of 1% Lidocaine without Epinephrine. The Tuohy needle was inserted into the epidural space using a paramedian approach.   The epidural space was localized using loss of resistance along with counter oblique bi-planar fluoroscopic views.  After negative aspirate for air, blood, and CSF, a 2 ml. volume of Isovue-250 was injected into the epidural space and the flow of contrast was observed. Radiographs were obtained for documentation purposes.    The injectate was administered into the level noted above.   Additional Comments:  No complications occurred Dressing: 2 x 2 sterile gauze and Band-Aid    Post-procedure details: Patient was observed during the procedure. Post-procedure instructions were reviewed.  Patient left the clinic in stable condition.   Clinical History: INDICATION: Chronic right-sided low back pain, unspecified whether sciatica present. RIGHT groin and RIGHT thigh pain. Symptoms for 2 months.  STUDY: MRI of the lumbar spine without intravenous contrast performed on 05/22/2023 11:17 AM.  COMPARISON: No comparison available.  TECHNIQUE: Multiplanar, multisequence MR imaging obtained through the lumbar spine without contrast on 05/22/2023 11:17 AM.  CONTRAST: None.  FINDINGS:  # Osseous structures: Anterolateral osteophytes and endplate degenerative changes noted at multiple levels. Mixed Modic type I/III changes at L1-L2 and predominantly Modic type  I changes at L3-L4. Modic type II changes at L2-L3 and L4-L5. Chronic compression deformities of the T11 and T12 vertebral bodies noted. No acute fracture appreciated.  #  Alignment:A dextrorotoscoliosis appreciated. Grade 1 anterolisthesis noted at L5-S1 with a pars interarticularis defect on the RIGHT at L5.  #  Conus medullaris/cauda equina: Spinal cord signal is normal and terminates at T12-L1. The cauda equina is unremarkable.  #  Lower thoracic spine: Disc desiccation with mild retropulsion of the superior endplate T11 and T12 resulting in mild effacement of ventral thecal sac. Neuroforamen are patent at T10-T11 and T11-T12. This is viewed on the sagittal images only.  #  T12-L1: Disc desiccation. No significant disc herniation, spinal canal, or neuroforaminal compromise. This is viewed on the sagittal images only.  #  L1-L2: Disc desiccation with severe loss of disc height and a disc bulge. Posterior lateral osteophytes noted. Ligamentum flavum redundancy appreciated. Mild spinal canal/lateral recess narrowing and mild bilateral neuroforaminal stenosis present.  #  L2-L3: Disc desiccation with severe loss of disc height and a disc bulge. Posterior lateral osteophyte on the LEFT. Ligamentum flavum redundancy. This results in mild spinal canal/lateral recess narrowing and mild LEFT neuroforaminal stenosis.  #  L3-L4: Disc desiccation with moderate loss disc height and a disc bulge. Posterior lateral osteophyte on the LEFT. Facet arthrosis and ligamentum flavum redundancy. This results in moderate spinal canal and mild lateral recess narrowing. Mild bilateral neuroforaminal stenosis.  #  L4-L5: Disc desiccation with severe loss of disc height. Disc bulge and posterior lateral osteophytes. Facet arthrosis and ligamentum flavum redundancy appreciated. Small joint effusions noted. This results in moderate spinal canal and mild lateral recess narrowing. Severe RIGHT and moderate LEFT neuroforaminal stenosis.   #  L5-S1: Desiccation with a grade 1 anterolisthesis. There is uncovering of the disc. Bilateral facet arthrosis and ligamentum flavum redundancy. Bilateral joint effusions noted. This results in mild spinal canal/lateral recess narrowing. Moderate to severe RIGHT and moderate LEFT neuroforaminal stenosis.  #  Paraspinal tissues: Unremarkable.  #  Additional comments: None.   IMPRESSION:  1. Multilevel degenerative disc disease, facet arthrosis, dextrorotoscoliosis with a grade 1 anterolisthesis at L5-S1. Of note, there is a partial spondylolysis on the RIGHT at L5-S1. This is most notable for moderate spinal canal and severe RIGHT and moderate LEFT neuroforaminal stenosis at L4-L5.  2.  Moderate to severe RIGHT and moderate LEFT neuroforaminal stenosis at L5-S1.  3.  Moderate spinal canal narrowing at L3-L4.  Electronically Signed by: Ardyth Man MD, PHD on 05/26/2023 8:34 AM     Objective:  VS:  HT:    WT:   BMI:     BP:122/80  HR:(!) 56bpm  TEMP: ( )  RESP:  Physical Exam Vitals and nursing note reviewed.  Constitutional:      General: She is not in acute distress.    Appearance: Normal appearance. She is not ill-appearing.  HENT:     Head: Normocephalic and atraumatic.     Right Ear: External ear normal.     Left Ear: External ear normal.  Eyes:     Extraocular Movements: Extraocular movements intact.  Cardiovascular:     Rate and Rhythm: Normal rate.     Pulses: Normal pulses.  Pulmonary:     Effort: Pulmonary effort is normal. No respiratory distress.  Abdominal:  General: There is no distension.     Palpations: Abdomen is soft.  Musculoskeletal:        General: Tenderness present.     Cervical back: Neck supple.     Right lower leg: No edema.     Left lower leg: No edema.     Comments: Patient has good distal strength with no pain over the greater trochanters.  No clonus or focal weakness.  Skin:    Findings: No erythema, lesion or rash.  Neurological:      General: No focal deficit present.     Mental Status: She is alert and oriented to person, place, and time.     Sensory: No sensory deficit.     Motor: No weakness or abnormal muscle tone.     Coordination: Coordination normal.  Psychiatric:        Mood and Affect: Mood normal.        Behavior: Behavior normal.      Imaging: No results found.

## 2023-11-08 ENCOUNTER — Other Ambulatory Visit: Payer: Self-pay | Admitting: Cardiology

## 2023-11-10 ENCOUNTER — Telehealth: Payer: Self-pay | Admitting: Cardiology

## 2023-11-10 NOTE — Telephone Encounter (Signed)
   Pre-operative Risk Assessment  Last visit: 08/02/2022  Next visit: 02/06/2024   Patient Name: [name]  DOB: 08/26/1946 MRN: [id number]      Request for Surgical Clearance    Procedure:   right total hip arthroplasty  Date of Surgery:  Clearance 02/13/24                                 Surgeon:  Dr. Durene Romans Surgeon's Group or Practice Name:  [facility] Phone number:  [phone] Fax number:  [phone]   Type of Clearance Requested:   - Medical    Type of Anesthesia:  Spinal   Additional requests/questions:    SignedRoyann Shivers   11/10/2023, 4:55 PM

## 2023-11-10 NOTE — Telephone Encounter (Signed)
   Name: [name]  DOB: August 14, 1946  MRN: [id number]  Primary Cardiologist: Rollene Rotunda, MD  Chart reviewed as part of pre-operative protocol coverage. The patient has an upcoming visit scheduled with Dr. Antoine Poche on 02/06/2024 at which time clearance can be addressed in case there are any issues that would impact surgical recommendations.  I added preop FYI to appointment note so that provider is aware to address at time of outpatient visit.  Per office protocol the cardiology provider should forward their finalized clearance decision and recommendations regarding antiplatelet therapy to the requesting party below.    I will route this message as FYI to requesting party and remove this message from the preop box as separate preop APP input not needed at this time.   Please call with any questions.  Napoleon Form, Leodis Rains, NP  11/10/2023, 5:22 PM

## 2023-11-24 ENCOUNTER — Other Ambulatory Visit (HOSPITAL_BASED_OUTPATIENT_CLINIC_OR_DEPARTMENT_OTHER): Payer: Self-pay

## 2023-11-24 MED ORDER — COMIRNATY 30 MCG/0.3ML IM SUSY
PREFILLED_SYRINGE | INTRAMUSCULAR | 0 refills | Status: DC
  Filled 2023-11-24: qty 0.3, 1d supply, fill #0

## 2023-11-27 ENCOUNTER — Encounter: Payer: Self-pay | Admitting: Podiatry

## 2023-11-27 ENCOUNTER — Encounter: Payer: Self-pay | Admitting: Hematology and Oncology

## 2023-11-27 ENCOUNTER — Ambulatory Visit: Payer: Medicare Other | Admitting: Podiatry

## 2023-11-27 DIAGNOSIS — Q72892 Other reduction defects of left lower limb: Secondary | ICD-10-CM

## 2023-11-27 DIAGNOSIS — L84 Corns and callosities: Secondary | ICD-10-CM

## 2023-11-27 DIAGNOSIS — M216X2 Other acquired deformities of left foot: Secondary | ICD-10-CM | POA: Diagnosis not present

## 2023-11-27 DIAGNOSIS — M21612 Bunion of left foot: Secondary | ICD-10-CM | POA: Diagnosis not present

## 2023-11-27 NOTE — Progress Notes (Signed)
  Subjective:  Patient ID: [name], female    DOB: Dec 23, 1945,   MRN: [id number]  No chief complaint on file.   78 y.o. female presents for concern of lesion on the bottom of her left foot that has been present for several years. She has had issues with this foot including bunions, brachymetatarsia and hammertoes. Relates the pain has been getting worse has tried padding in the area but still painful   . Denies any other pedal complaints. Denies n/v/f/c.   Past Medical History:  Diagnosis Date   Allergic rhinitis    Allergy    Arthritis    knees (08/28/2018)   Breast cancer, right breast (HCC) 05/20/2013   no chemo (08/28/2018)   Chronic lower back pain    Clotting disorder (HCC)    Diverticulosis    DOE (dyspnea on exertion)    Dysrhythmia    GERD (gastroesophageal reflux disease)    uses levsin  once every 2-3 months   Graves' disease    2011   Heart palpitations    History of kidney stones    HLD (hyperlipidemia)    Hx of radiation therapy 08/13/13- 09/06/13   right breast 4250 cGy 17 sessions   Hypertension    controlled   IBS (irritable bowel syndrome)    Lumbar herniated disc    L4-L5   Major depression    MVP (mitral valve prolapse)    Osteopenia    Osteoporosis    zometa  infusion 11/2021- is yearly infusion   Personal history of radiation therapy    Pneumonia 08/2019   PONV (postoperative nausea and vomiting)    when I have general anesthetic (08/28/2018)   Pulmonary emboli (HCC)    after knee replacement   PVC (premature ventricular contraction)    Takes metoprolol  PRN for  pvc's   Tubular adenoma of colon 03/07/2012   Uterine hyperplasia    vaginal US  every 6 months and had 2 biopsies so far with chnages noted    Objective:  Physical Exam: Vascular: DP/PT pulses 2/4 bilateral. CFT <3 seconds. Normal hair growth on digits. No edema.  Skin. No lacerations or abrasions bilateral feet. Hyperkeratotic lesio nnoted sub second and third  metatarsal heads.  Musculoskeletal: MMT 5/5 bilateral lower extremities in DF, PF, Inversion and Eversion. Deceased ROM in DF of ankle joint. HAV deformity noted with hammered digits 2-5 on left and brachymetatrsia of fourth digit.  Neurological: Sensation intact to light touch.   Assessment:   1. Callus of foot   2. Plantar flexed metatarsal bone of left foot   3. Bunion, left   4. Brachymetatarsia of left foot      Plan:  Patient was evaluated and treated and all questions answered. -Discussed benign skin lesions and foot deformities  with patient and treatment options.  -Hyperkeratotic tissue was debrided with chisel without incident as cpirtesu/  -Encouraged daily moisturizing -Discussed use of pumice stone -Advised good supportive shoes and inserts. Discussed OTC and CMO.  -Patient to return to office as needed or sooner if condition worsens.   Asberry Failing, DPM

## 2023-11-28 ENCOUNTER — Other Ambulatory Visit: Payer: Self-pay | Admitting: Hematology and Oncology

## 2023-11-28 DIAGNOSIS — Z1231 Encounter for screening mammogram for malignant neoplasm of breast: Secondary | ICD-10-CM

## 2023-12-08 ENCOUNTER — Other Ambulatory Visit: Payer: Self-pay

## 2023-12-08 DIAGNOSIS — Z170 Estrogen receptor positive status [ER+]: Secondary | ICD-10-CM

## 2023-12-11 ENCOUNTER — Encounter: Payer: Self-pay | Admitting: Hematology and Oncology

## 2023-12-11 ENCOUNTER — Inpatient Hospital Stay: Payer: Medicare Other

## 2023-12-11 ENCOUNTER — Inpatient Hospital Stay: Payer: Medicare Other | Attending: Hematology and Oncology

## 2023-12-11 ENCOUNTER — Inpatient Hospital Stay: Payer: Medicare Other | Admitting: Hematology and Oncology

## 2023-12-11 ENCOUNTER — Other Ambulatory Visit: Payer: Self-pay | Admitting: Hematology and Oncology

## 2023-12-11 VITALS — BP 136/74 | HR 54 | Temp 97.4°F | Resp 18 | Ht 65.0 in | Wt 134.1 lb

## 2023-12-11 DIAGNOSIS — Z853 Personal history of malignant neoplasm of breast: Secondary | ICD-10-CM | POA: Diagnosis present

## 2023-12-11 DIAGNOSIS — C50411 Malignant neoplasm of upper-outer quadrant of right female breast: Secondary | ICD-10-CM

## 2023-12-11 DIAGNOSIS — E785 Hyperlipidemia, unspecified: Secondary | ICD-10-CM | POA: Diagnosis not present

## 2023-12-11 DIAGNOSIS — Z86711 Personal history of pulmonary embolism: Secondary | ICD-10-CM | POA: Diagnosis not present

## 2023-12-11 DIAGNOSIS — M810 Age-related osteoporosis without current pathological fracture: Secondary | ICD-10-CM

## 2023-12-11 DIAGNOSIS — Z7901 Long term (current) use of anticoagulants: Secondary | ICD-10-CM | POA: Diagnosis not present

## 2023-12-11 DIAGNOSIS — Z170 Estrogen receptor positive status [ER+]: Secondary | ICD-10-CM

## 2023-12-11 DIAGNOSIS — M1990 Unspecified osteoarthritis, unspecified site: Secondary | ICD-10-CM | POA: Diagnosis not present

## 2023-12-11 DIAGNOSIS — Z923 Personal history of irradiation: Secondary | ICD-10-CM | POA: Insufficient documentation

## 2023-12-11 LAB — CBC WITH DIFFERENTIAL (CANCER CENTER ONLY)
Abs Immature Granulocytes: 0.03 10*3/uL (ref 0.00–0.07)
Basophils Absolute: 0 10*3/uL (ref 0.0–0.1)
Basophils Relative: 1 %
Eosinophils Absolute: 0.2 10*3/uL (ref 0.0–0.5)
Eosinophils Relative: 4 %
HCT: 42.6 % (ref 36.0–46.0)
Hemoglobin: 13.8 g/dL (ref 12.0–15.0)
Immature Granulocytes: 1 %
Lymphocytes Relative: 13 %
Lymphs Abs: 0.7 10*3/uL (ref 0.7–4.0)
MCH: 33.6 pg (ref 26.0–34.0)
MCHC: 32.4 g/dL (ref 30.0–36.0)
MCV: 103.6 fL — ABNORMAL HIGH (ref 80.0–100.0)
Monocytes Absolute: 0.5 10*3/uL (ref 0.1–1.0)
Monocytes Relative: 8 %
Neutro Abs: 4.3 10*3/uL (ref 1.7–7.7)
Neutrophils Relative %: 73 %
Platelet Count: 243 10*3/uL (ref 150–400)
RBC: 4.11 MIL/uL (ref 3.87–5.11)
RDW: 12.1 % (ref 11.5–15.5)
WBC Count: 5.8 10*3/uL (ref 4.0–10.5)
nRBC: 0 % (ref 0.0–0.2)

## 2023-12-11 LAB — CMP (CANCER CENTER ONLY)
ALT: 13 U/L (ref 0–44)
AST: 19 U/L (ref 15–41)
Albumin: 4.1 g/dL (ref 3.5–5.0)
Alkaline Phosphatase: 75 U/L (ref 38–126)
Anion gap: 4 — ABNORMAL LOW (ref 5–15)
BUN: 16 mg/dL (ref 8–23)
CO2: 29 mmol/L (ref 22–32)
Calcium: 9.4 mg/dL (ref 8.9–10.3)
Chloride: 108 mmol/L (ref 98–111)
Creatinine: 0.91 mg/dL (ref 0.44–1.00)
GFR, Estimated: >60 mL/min (ref >60)
Glucose, Bld: 67 mg/dL — ABNORMAL LOW (ref 70–99)
Potassium: 4.3 mmol/L (ref 3.5–5.1)
Sodium: 141 mmol/L (ref 135–145)
Total Bilirubin: 0.6 mg/dL (ref 0.0–1.2)
Total Protein: 6.4 g/dL — ABNORMAL LOW (ref 6.5–8.1)

## 2023-12-11 MED ORDER — SODIUM CHLORIDE 0.9 % IV SOLN: Freq: Once | INTRAVENOUS | Status: AC

## 2023-12-11 MED ORDER — ZOLEDRONIC ACID 4 MG/100ML IV SOLN
4.0000 mg | Freq: Once | INTRAVENOUS | Status: AC
  Administered 2023-12-11: 4 mg via INTRAVENOUS

## 2023-12-11 MED ORDER — CALCIUM CITRATE + D 250-5 MG-MCG PO TABS: 2.0000 | ORAL_TABLET | Freq: Every day | ORAL | Status: AC

## 2023-12-11 NOTE — Assessment & Plan Note (Signed)
Right breast invasive lobular cancer with LCIS T1 N0 M0 stage IA status post lumpectomy which did not show any further evidence of invasive breast cancer. ER positive PR positive HER-2 negative, received adjuvant radiation completed 09/04/2013, started her tamoxifen 20 mg daily from November 2013-10/15/2018, switched to letrozole completed 12/07/2021   Letrozole toxicities:  Denies any hot flashes or significant increase in arthralgias or myalgias. Recommended discontinuation of letrozole therapy at this time.   Breast cancer surveillance: 1. Breast exam 12/11/2023 does not reveal any palpable lumps or nodules of concern. 2. Mammogram scheduled for 12/15/2023 3. Breast MRI 06/25/2018: Negative, we do not plan to do regular MRIs     Pulmonary embolism 10/03/2018: Treated with 6 months of Xarelto (knee replacement and tamoxifen)   Osteoporosis: Bone density 11/03/2021: T score -2.9.  Previously bone density showed a T score of -2.4: She does exercise and calcium and vitamin D.  On Prolia every 6 months  Return to clinic in 1 year for follow-up

## 2023-12-11 NOTE — Patient Instructions (Signed)
Zoledronic Acid Injection What is this medication? ZOLEDRONIC ACID (ZOE le dron ik AS id) treats high calcium levels in the blood caused by cancer. It may also be used with chemotherapy to treat weakened bones caused by cancer. It works by slowing down the release of calcium from bones. This lowers calcium levels in your blood. It also makes your bones stronger and less likely to break (fracture). It belongs to a group of medications called bisphosphonates. This medicine may be used for other purposes; ask your health care provider or pharmacist if you have questions. COMMON BRAND NAME(S): Zometa, Zometa Powder What should I tell my care team before I take this medication? They need to know if you have any of these conditions: Dehydration Dental disease Kidney disease Liver disease Low levels of calcium in the blood Lung or breathing disease, such as asthma Receiving steroids, such as dexamethasone or prednisone An unusual or allergic reaction to zoledronic acid, other medications, foods, dyes, or preservatives Pregnant or trying to get pregnant Breast-feeding How should I use this medication? This medication is injected into a vein. It is given by your care team in a hospital or clinic setting. Talk to your care team about the use of this medication in children. Special care may be needed. Overdosage: If you think you have taken too much of this medicine contact a poison control center or emergency room at once. NOTE: This medicine is only for you. Do not share this medicine with others. What if I miss a dose? Keep appointments for follow-up doses. It is important not to miss your dose. Call your care team if you are unable to keep an appointment. What may interact with this medication? Certain antibiotics given by injection Diuretics, such as bumetanide, furosemide NSAIDs, medications for pain and inflammation, such as ibuprofen or naproxen Teriparatide Thalidomide This list may not  describe all possible interactions. Give your health care provider a list of all the medicines, herbs, non-prescription drugs, or dietary supplements you use. Also tell them if you smoke, drink alcohol, or use illegal drugs. Some items may interact with your medicine. What should I watch for while using this medication? Visit your care team for regular checks on your progress. It may be some time before you see the benefit from this medication. Some people who take this medication have severe bone, joint, or muscle pain. This medication may also increase your risk for jaw problems or a broken thigh bone. Tell your care team right away if you have severe pain in your jaw, bones, joints, or muscles. Tell you care team if you have any pain that does not go away or that gets worse. Tell your dentist and dental surgeon that you are taking this medication. You should not have major dental surgery while on this medication. See your dentist to have a dental exam and fix any dental problems before starting this medication. Take good care of your teeth while on this medication. Make sure you see your dentist for regular follow-up appointments. You should make sure you get enough calcium and vitamin D while you are taking this medication. Discuss the foods you eat and the vitamins you take with your care team. Check with your care team if you have severe diarrhea, nausea, and vomiting, or if you sweat a lot. The loss of too much body fluid may make it dangerous for you to take this medication. You may need bloodwork while taking this medication. Talk to your care team if you  wish to become pregnant or think you might be pregnant. This medication can cause serious birth defects. What side effects may I notice from receiving this medication? Side effects that you should report to your care team as soon as possible: Allergic reactions--skin rash, itching, hives, swelling of the face, lips, tongue, or throat Kidney  injury--decrease in the amount of urine, swelling of the ankles, hands, or feet Low calcium level--muscle pain or cramps, confusion, tingling, or numbness in the hands or feet Osteonecrosis of the jaw--pain, swelling, or redness in the mouth, numbness of the jaw, poor healing after dental work, unusual discharge from the mouth, visible bones in the mouth Severe bone, joint, or muscle pain Side effects that usually do not require medical attention (report to your care team if they continue or are bothersome): Constipation Fatigue Fever Loss of appetite Nausea Stomach pain This list may not describe all possible side effects. Call your doctor for medical advice about side effects. You may report side effects to FDA at 1-800-FDA-1088. Where should I keep my medication? This medication is given in a hospital or clinic. It will not be stored at home. NOTE: This sheet is a summary. It may not cover all possible information. If you have questions about this medicine, talk to your doctor, pharmacist, or health care provider.  2023 Elsevier/Gold Standard (2021-12-23 00:00:00)

## 2023-12-11 NOTE — Progress Notes (Signed)
Patient Care Team: Ollen Bowl, MD as PCP - General (Internal Medicine) Rollene Rotunda, MD as PCP - Cardiology (Cardiology)  DIAGNOSIS:  Encounter Diagnosis  Name Primary?   Malignant neoplasm of upper-outer quadrant of right breast in female, estrogen receptor positive (HCC) Yes    SUMMARY OF ONCOLOGIC HISTORY: Oncology History  Breast cancer of upper-outer quadrant of right female breast (HCC)  04/23/2013 Initial Diagnosis   Right breast calcifications stereotactic biopsy revealed invasive lobular cancer with LCIS, lymph node benign, ER positive PR negative Ki-67 10%   06/11/2013 Surgery   Right breast lumpectomy: Lobular carcinoma in situ, no invasive disease found, sentinel nodes negative   08/06/2013 - 09/04/2013 Radiation Therapy   Adjuvant radiation therapy   10/03/2013 - 12/07/2021 Anti-estrogen oral therapy   Tamoxifen 20 mg daily, switched to letrozole 10/15/18 due to PE; discontinued after 9 years of therapy     CHIEF COMPLIANT: Surveillance of cancer  HISTORY OF PRESENT ILLNESS:  History of Present Illness   [name], a patient with a history of kidney stones and a previous PE, presents for a routine follow-up visit for her history of breast cancer.  She denies any lumps or nodules in the breast.  She is going to undergo hip replacement surgery.  Because of her prior history of prior PE she will go on anticoagulation for 2 to 3 weeks after surgery.  She reports a change in her atorvastatin dosage from 40mg  to 30mg  due to diarrhea. She also mentions that she takes calcium citrate for her kidney stones. She no longer takes meloxicam, prednisone, or Prevident, but has recently been prescribed Celebrex.         ALLERGIES:  is allergic to dilaudid [hydromorphone hcl], terak [terramycin], oxybutynin, amoxicillin, and erythromycin.  MEDICATIONS:  Current Outpatient Medications  Medication Sig Dispense Refill   Calcium Citrate-Vitamin D (CALCIUM CITRATE + D) 250-5  MG-MCG TABS Take 2 tablets by mouth daily at 12 noon.     atorvastatin (LIPITOR) 10 MG tablet TAKE ONE TABLET BY MOUTH DAILY WITH 20MG  TABLET FOR TOTAL DOSE OF 30MG . 90 tablet 0   atorvastatin (LIPITOR) 20 MG tablet TAKE ONE TABLET DAILY WITH 10MG  TABLET FOR TOTAL DOSE OF 30MG . 90 tablet 0   buPROPion (WELLBUTRIN XL) 300 MG 24 hr tablet 1 tablet in the morning Orally Once a day 30 tablet 0   COVID-19 mRNA vaccine, Pfizer, (COMIRNATY) syringe Inject into the muscle. 0.3 mL 0   ECHINACEA PO Take 1,300 mg by mouth daily as needed (immune support).     fluticasone (FLONASE) 50 MCG/ACT nasal spray Place 2 sprays into the nose daily.      hyoscyamine (LEVSIN SL) 0.125 MG SL tablet Take 1 tablet by mouth every 6-8 hours as needed for abdominal pain (Patient taking differently: Take 0.125 mg by mouth every 6 (six) hours as needed (heartburn).) 60 tablet 2   ibuprofen (ADVIL) 200 MG tablet Take 200-400 mg by mouth every 8 (eight) hours as needed (pain.).     metoprolol tartrate (LOPRESSOR) 25 MG tablet Take 0.5 tablets (12.5 mg total) by mouth 2 (two) times daily. 90 tablet 3   Multiple Vitamin (MULTIVITAMIN WITH MINERALS) TABS tablet Take 1 tablet by mouth daily.     polyethylene glycol (MIRALAX / GLYCOLAX) 17 g packet Take 17 g by mouth daily as needed for moderate constipation.     Probiotic Product (PROBIOTIC DAILY PO) Take 1 capsule by mouth daily.     senna (SENOKOT) 8.6 MG tablet  Take 1 tablet by mouth daily as needed for constipation.     SYNTHROID 75 MCG tablet TAKE ONE TABLET EVERY MORNING ON AN EMPTY STOMACH 30 tablet 3   Turmeric (QC TUMERIC COMPLEX PO) Take 1,500 mg by mouth daily.     zolpidem (AMBIEN) 10 MG tablet Take 2.5 mg by mouth at bedtime as needed for sleep.      Zoster Vaccine Adjuvanted [facility]) injection Inject 0.5 mLs into the muscle. 0.5 mL 0   No current facility-administered medications for this visit.    PHYSICAL EXAMINATION: ECOG PERFORMANCE STATUS: 1 - Symptomatic but  completely ambulatory  Vitals:   12/11/23 1148  BP: 136/74  Pulse: (!) 54  Resp: 18  Temp: (!) 97.4 F (36.3 C)  SpO2: 100%   Filed Weights   12/11/23 1148  Weight: 134 lb 1.6 oz (60.8 kg)      LABORATORY DATA:  I have reviewed the data as listed    Latest Ref Rng & Units 12/07/2022   10:41 AM 12/06/2021    8:27 AM 03/11/2021    3:47 PM  CMP  Glucose 70 - 99 mg/dL 69  77  85   BUN 8 - 23 mg/dL 14  18  17    Creatinine 0.44 - 1.00 mg/dL 0.34  7.42  5.95   Sodium 135 - 145 mmol/L 140  137  141   Potassium 3.5 - 5.1 mmol/L 3.8  4.2  4.2   Chloride 98 - 111 mmol/L 107  105  105   CO2 22 - 32 mmol/L 28  27  30    Calcium 8.9 - 10.3 mg/dL 9.2  8.8  9.4   Total Protein 6.5 - 8.1 g/dL 6.3   6.7   Total Bilirubin 0.3 - 1.2 mg/dL 0.6   0.4   Alkaline Phos 38 - 126 U/L 61   71   AST 15 - 41 U/L 20   17   ALT 0 - 44 U/L 13   13     Lab Results  Component Value Date   WBC 5.8 12/11/2023   HGB 13.8 12/11/2023   HCT 42.6 12/11/2023   MCV 103.6 (H) 12/11/2023   PLT 243 12/11/2023   NEUTROABS 4.3 12/11/2023    ASSESSMENT & PLAN:  Breast cancer of upper-outer quadrant of right female breast (HCC) Right breast invasive lobular cancer with LCIS T1 N0 M0 stage IA status post lumpectomy which did not show any further evidence of invasive breast cancer. ER positive PR positive HER-2 negative, received adjuvant radiation completed 09/04/2013, started her tamoxifen 20 mg daily from November 2013-10/15/2018, switched to letrozole completed 12/07/2021   Letrozole toxicities:  Denies any hot flashes or significant increase in arthralgias or myalgias. Recommended discontinuation of letrozole therapy at this time.   Breast cancer surveillance: 1. Breast exam 12/11/2023 does not reveal any palpable lumps or nodules of concern. 2. Mammogram scheduled for 12/15/2023 3. Breast MRI 06/25/2018: Negative, we do not plan to do regular MRIs     Pulmonary embolism 10/03/2018: Treated with 6 months of  Xarelto (knee replacement and tamoxifen)   Osteoporosis: Bone density 11/03/2021: T score -2.9.  Previously bone density showed a T score of -2.4: She does exercise and calcium and vitamin D.  On Prolia every 6 months  Return to clinic in 1 year for follow-up ------------------------------------- Assessment and Plan    Hyperlipidemia Atorvastatin 40mg  caused diarrhea, now on 30mg  (20mg  + 10mg ) daily without side effects. -Continue Atorvastatin 30mg  daily.  History of Kidney Stones Taking Calcium Citrate for prevention. -Continue Calcium Citrate daily.  Osteoarthritis Scheduled for hip replacement in March. Recently started on Celebrex. -Continue Celebrex as prescribed.  History of Pulmonary Embolism Discussed risk of clotting post-surgery due to immobility. Orthopedic surgeon suggested Xarelto for a couple of weeks post-surgery. -Start Xarelto post-surgery for 2-3 weeks.  General Health Maintenance -Continue annual mammograms. -Schedule follow-up appointment for February 2026. -Proceed with Zometa infusion today.          No orders of the defined types were placed in this encounter.  The patient has a good understanding of the overall plan. she agrees with it. she will call with any problems that may develop before the next visit here. Total time spent: 30 mins including face to face time and time spent for planning, charting and co-ordination of care   Tamsen Meek, MD 12/11/23

## 2023-12-15 ENCOUNTER — Ambulatory Visit
Admission: RE | Admit: 2023-12-15 | Discharge: 2023-12-15 | Disposition: A | Discharge status: Home / Self Care | Payer: Medicare Other | Source: Ambulatory Visit | Attending: Hematology and Oncology | Admitting: Hematology and Oncology

## 2023-12-15 DIAGNOSIS — Z1231 Encounter for screening mammogram for malignant neoplasm of breast: Secondary | ICD-10-CM

## 2023-12-21 ENCOUNTER — Ambulatory Visit: Payer: Medicare Other | Attending: Nurse Practitioner | Admitting: Nurse Practitioner

## 2023-12-21 ENCOUNTER — Encounter: Payer: Self-pay | Admitting: Nurse Practitioner

## 2023-12-21 VITALS — BP 138/80 | HR 40 | Ht 63.0 in | Wt 134.2 lb

## 2023-12-21 DIAGNOSIS — I2510 Atherosclerotic heart disease of native coronary artery without angina pectoris: Secondary | ICD-10-CM

## 2023-12-21 DIAGNOSIS — E785 Hyperlipidemia, unspecified: Secondary | ICD-10-CM

## 2023-12-21 DIAGNOSIS — Z86711 Personal history of pulmonary embolism: Secondary | ICD-10-CM

## 2023-12-21 DIAGNOSIS — R002 Palpitations: Secondary | ICD-10-CM

## 2023-12-21 DIAGNOSIS — I340 Nonrheumatic mitral (valve) insufficiency: Secondary | ICD-10-CM | POA: Diagnosis not present

## 2023-12-21 DIAGNOSIS — Z01810 Encounter for preprocedural cardiovascular examination: Secondary | ICD-10-CM

## 2023-12-21 NOTE — Progress Notes (Signed)
Office Visit    Patient Name: [name] Date of Encounter: 12/21/2023  Primary Care Provider:  Ollen Bowl, MD Primary Cardiologist:  Rollene Rotunda, MD  Chief Complaint    78 year old female with a history of CAD, mitral valve regurgitation, palpitations, hyperlipidemia, PE, Graves' disease, kidney stones, and right sided breast cancer s/p lumpectomy and radiation who presents for follow-up related to CAD and palpitations and for preoperative cardiac evaluation.  Past Medical History    Past Medical History:  Diagnosis Date   Allergic rhinitis    Allergy    Arthritis    "knees" (08/28/2018)   Breast cancer, right breast (HCC) 05/20/2013   "no chemo" (08/28/2018)   Chronic lower back pain    Clotting disorder (HCC)    Diverticulosis    DOE (dyspnea on exertion)    Dysrhythmia    GERD (gastroesophageal reflux disease)    uses levsin once every 2-3 months   Graves' disease    2011   Heart palpitations    History of kidney stones    HLD (hyperlipidemia)    Hx of radiation therapy 08/13/13- 09/06/13   right breast 4250 cGy 17 sessions   Hypertension    controlled   IBS (irritable bowel syndrome)    Lumbar herniated disc    L4-L5   Major depression    MVP (mitral valve prolapse)    Osteopenia    Osteoporosis    zometa infusion 11/2021- is yearly infusion   Personal history of radiation therapy    Pneumonia 08/2019   PONV (postoperative nausea and vomiting)    "when I have general anesthetic" (08/28/2018)   Pulmonary emboli (HCC)    after knee replacement   PVC (premature ventricular contraction)    Takes metoprolol PRN for  pvc's   Tubular adenoma of colon 03/07/2012   Uterine hyperplasia    vaginal [place] every 6 months and had 2 biopsies so far with chnages noted   Past Surgical History:  Procedure Laterality Date   BREAST BIOPSY Right 2014   BREAST EXCISIONAL BIOPSY Right    BREAST LUMPECTOMY Right 2014   BREAST LUMPECTOMY WITH NEEDLE  LOCALIZATION AND AXILLARY SENTINEL LYMPH NODE BX Right 06/11/2013   Procedure: BREAST LUMPECTOMY WITH NEEDLE LOCALIZATION AND AXILLARY SENTINEL LYMPH NODE BX;  Surgeon: Shelly Rubenstein, MD;  Location: [facility];  Service: General;  Laterality: Right;  Needle localization BCG 7:30   COLONOSCOPY     COLONOSCOPY W/ BIOPSIES AND POLYPECTOMY  multiple   CYSTOSCOPY WITH STENT PLACEMENT Left 08/15/2019   Procedure: CYSTOSCOPY, left retrograde pyleogram  WITH STENT EXCHANGE left and foley placement;  Surgeon: Marcine Matar, MD;  Location: [facility];  Service: Urology;  Laterality: Left;   IR URETERAL STENT LEFT NEW ACCESS W/O SEP NEPHROSTOMY CATH  08/12/2019   IR URETERAL STENT LEFT NEW ACCESS W/O SEP NEPHROSTOMY CATH  12/09/2021   KNEE ARTHROSCOPY Left 2006   NEPHROLITHOTOMY Left 08/12/2019   Procedure: NEPHROLITHOTOMY PERCUTANEOUS/INSERTION DOUBLE J STENT;  Surgeon: Marcine Matar, MD;  Location: [facility];  Service: Urology;  Laterality: Left;  2 HRS   NEPHROLITHOTOMY Left 12/09/2021   Procedure: NEPHROLITHOTOMY PERCUTANEOUS;  Surgeon: Marcine Matar, MD;  Location: [facility];  Service: Urology;  Laterality: Left;  2 HRS   POLYPECTOMY     TOTAL KNEE ARTHROPLASTY Right 08/28/2018   Procedure: RIGHT TOTAL KNEE ARTHROPLASTY;  Surgeon: Valeria Batman, MD;  Location: [facility];  Service: Orthopedics;  Laterality: Right;   uterine biopsy  x 2 with no sedation    Allergies  Allergies  Allergen Reactions   Dilaudid [Hydromorphone Hcl] Nausea And Vomiting   Oxybutynin Chloride     Other Reaction(s): hallucinations   Terak [Terramycin] Rash   Oxybutynin Other (See Comments)    Patient reports hallucinations.    Amoxicillin Diarrhea and Rash    Has patient had a amoxicillin reaction causing immediate rash, facial/tongue/throat swelling, SOB or lightheadedness with hypotension: No  Has patient had a PCN reaction causing severe rash involving mucus membranes or skin necrosis: No  Has patient  had a PCN reaction that required hospitalization: No  Has patient had a PCN reaction occurring within the last 10 years: Unknown  If all of the above answers are "NO", then may proceed with Cephalosporin use.   Erythromycin Rash   Oxytetracycline Rash     Labs/Other Studies Reviewed    The following studies were reviewed today:  Cardiac Studies & Procedures     STRESS TESTS  EXERCISE TOLERANCE TEST (ETT) 06/05/2019  Narrative  Horizontal ST segment depression ST segment depression was noted during stress in the II, III and aVF leads, beginning at 4 minutes of stress, and returning to baseline after less than 1 minute of recovery.  Blood pressure demonstrated a normal response to exercise.  Positive adequate treadmill stress test. 2mm horizontal ST depressions in exercise seen in II, III, aVF with intermediate Duke treadmill score.  ECHOCARDIOGRAM  ECHOCARDIOGRAM COMPLETE 09/27/2021  Narrative ECHOCARDIOGRAM REPORT    Patient Name:   ANNALY SKOP Date of Exam: 09/27/2021 Medical Rec #:  [id number]        Height:       65.5 in Accession #:    [id number]       Weight:       133.0 lb Date of Birth:  [birth year]         BSA:          1.673 m Patient Age:    75 years         BP:           114/74 mmHg Patient Gender: F                HR:           47 bpm. Exam Location:  [address]  Procedure: 2D Echo  Indications:    I05.9 Mitral vakve disorder  History:        Patient has prior history of Echocardiogram examinations, most recent 10/04/2018. Signs/Symptoms:Palpitations and Shortness of Breath; Risk Factors:HLD.  Sonographer:    Clearence Ped RCS Referring Phys: [id number] JAMES HOCHREIN  IMPRESSIONS   1. Left ventricular ejection fraction, by estimation, is 60 to 65%. The left ventricle has normal function. The left ventricle has no regional wall motion abnormalities. Left ventricular diastolic parameters were normal. 2. Right ventricular systolic function is normal.  The right ventricular size is normal. There is normal pulmonary artery systolic pressure. The estimated right ventricular systolic pressure is 23.8 mmHg. 3. The mitral valve is normal in structure. Mild mitral valve regurgitation. No evidence of mitral stenosis. 4. The aortic valve is tricuspid. Aortic valve regurgitation is trivial. No aortic stenosis is present. 5. Pulmonic valve regurgitation is moderate. 6. The inferior vena cava is normal in size with greater than 50% respiratory variability, suggesting right atrial pressure of 3 mmHg.  Comparison(s): No significant change from prior study.  Conclusion(s)/Recommendation(s): Otherwise normal echocardiogram, with minor abnormalities described in the report.  FINDINGS Left Ventricle: Left ventricular ejection fraction, by estimation, is 60 to 65%. The left ventricle has normal function. The left ventricle has no regional wall motion abnormalities. Global longitudinal strain performed but not reported based on interpreter judgement due to suboptimal tracking. The left ventricular internal cavity size was normal in size. There is no left ventricular hypertrophy. Left ventricular diastolic parameters were normal.  Right Ventricle: The right ventricular size is normal. No increase in right ventricular wall thickness. Right ventricular systolic function is normal. There is normal pulmonary artery systolic pressure. The tricuspid regurgitant velocity is 2.28 m/s, and with an assumed right atrial pressure of 3 mmHg, the estimated right ventricular systolic pressure is 23.8 mmHg.  Left Atrium: Left atrial size was normal in size.  Right Atrium: Right atrial size was normal in size.  Pericardium: There is no evidence of pericardial effusion.  Mitral Valve: The mitral valve is normal in structure. Mild mitral valve regurgitation. No evidence of mitral valve stenosis.  Tricuspid Valve: The tricuspid valve is normal in structure. Tricuspid valve  regurgitation is trivial. No evidence of tricuspid stenosis.  Aortic Valve: The aortic valve is tricuspid. Aortic valve regurgitation is trivial. Aortic regurgitation PHT measures 297 msec. No aortic stenosis is present.  Pulmonic Valve: The pulmonic valve was grossly normal. Pulmonic valve regurgitation is moderate. No evidence of pulmonic stenosis.  Aorta: The aortic root, ascending aorta, aortic arch and descending aorta are all structurally normal, with no evidence of dilitation or obstruction.  Venous: The inferior vena cava is normal in size with greater than 50% respiratory variability, suggesting right atrial pressure of 3 mmHg.  IAS/Shunts: The atrial septum is grossly normal.   LEFT VENTRICLE PLAX 2D LVIDd:         3.80 cm   Diastology LVIDs:         2.30 cm   LV e' medial:    6.42 cm/s LV PW:         1.00 cm   LV E/e' medial:  11.6 LV IVS:        0.80 cm   LV e' lateral:   11.70 cm/s LVOT diam:     2.10 cm   LV E/e' lateral: 6.4 LV SV:         101 LV SV Index:   60 LVOT Area:     3.46 cm   RIGHT VENTRICLE RV Basal diam:  3.30 cm RV S prime:     13.10 cm/s TAPSE (M-mode): 2.2 cm RVSP:           23.8 mmHg  LEFT ATRIUM             Index        RIGHT ATRIUM           Index LA diam:        3.10 cm 1.85 cm/m   RA Pressure: 3.00 mmHg LA Vol (A2C):   43.6 ml 26.07 ml/m  RA Area:     12.80 cm LA Vol (A4C):   33.5 ml 20.03 ml/m  RA Volume:   30.50 ml  18.23 ml/m LA Biplane Vol: 40.1 ml 23.97 ml/m AORTIC VALVE LVOT Vmax:   126.00 cm/s LVOT Vmean:  80.000 cm/s LVOT VTI:    0.292 m AI PHT:      297 msec  AORTA Ao Root diam: 3.10 cm Ao Asc diam:  3.40 cm  MITRAL VALVE  TRICUSPID VALVE MV Area (PHT):             TR Peak grad:   20.8 mmHg MV Decel Time:             TR Vmax:        228.00 cm/s MV E velocity: 74.40 cm/s  Estimated RAP:  3.00 mmHg MV A velocity: 67.60 cm/s  RVSP:           23.8 mmHg MV E/A ratio:  1.10 SHUNTS Systemic VTI:  0.29  m Systemic Diam: 2.10 cm  Jodelle Red MD Electronically signed by Jodelle Red MD Signature Date/Time: 09/27/2021/2:09:37 PM    Final            Recent Labs: 12/11/2023: ALT 13; BUN 16; Creatinine 0.91; Hemoglobin 13.8; Platelet Count 243; Potassium 4.3; Sodium 141  Recent Lipid Panel    Component Value Date/Time   CHOL 165 01/27/2023 1247   TRIG 54 01/27/2023 1247   HDL 81 01/27/2023 1247   CHOLHDL 2.0 01/27/2023 1247   LDLCALC 73 01/27/2023 1247    History of Present Illness    78 year old female with the above past medical history including CAD, mitral valve regurgitation, palpitations, hyperlipidemia, PE, Graves' disease, kidney stones, and right sided breast cancer s/p lumpectomy and radiation.  She has a history of nonobstructive CAD.  Coronary CT performed while pt was living in [place] per pt revealed some diagonal stenosis (records not available for review).  Echocardiogram in 2019 was unremarkable.  ETT in 2020 was negative for ischemia.  She has a history of PE following knee replacement.  Most recent echocardiogram in 2022 showed EF 60 to 65%, normal LV function, no RWMA, normal RV systolic function, mild mitral valve regurgitation, no significant change from prior study.  She was last seen in office on 08/02/2022 and was doing well from a cardiac standpoint.  She denied palpitations, denied symptoms concerning for angina.  She presents today for follow-up and for preoperative cardiac evaluation for right total hip arthroplasty with Dr. Durene Romans of [facility].  Since her last visit she has been stable from a cardiac standpoint.  Her activity has been somewhat limited in the setting of severe hip pain, back pain.  She denies any symptoms concerning for angina, denies palpitations, dizziness, presyncope, syncope, dyspnea, edema, PND, orthopnea, weight gain.  Overall, she reports feeling well.  Home Medications    Current Outpatient Medications   Medication Sig Dispense Refill   atorvastatin (LIPITOR) 10 MG tablet TAKE ONE TABLET BY MOUTH DAILY WITH 20MG  TABLET FOR TOTAL DOSE OF 30MG . 90 tablet 0   atorvastatin (LIPITOR) 20 MG tablet TAKE ONE TABLET DAILY WITH 10MG  TABLET FOR TOTAL DOSE OF 30MG . 90 tablet 0   buPROPion (WELLBUTRIN XL) 300 MG 24 hr tablet 1 tablet in the morning Orally Once a day 30 tablet 0   Calcium Citrate-Vitamin D (CALCIUM CITRATE + D) 250-5 MG-MCG TABS Take 2 tablets by mouth daily at 12 noon.     COVID-19 mRNA vaccine, Pfizer, (COMIRNATY) syringe Inject into the muscle. 0.3 mL 0   diclofenac (VOLTAREN) 75 MG EC tablet Take 75 mg by mouth daily.     ECHINACEA PO Take 1,300 mg by mouth daily as needed (immune support).     fluticasone (FLONASE) 50 MCG/ACT nasal spray Place 2 sprays into the nose daily.      hyoscyamine (LEVSIN SL) 0.125 MG SL tablet Take 1 tablet by mouth every 6-8 hours as needed for abdominal pain (  Patient taking differently: Take 0.125 mg by mouth every 6 (six) hours as needed (heartburn).) 60 tablet 2   ibuprofen (ADVIL) 200 MG tablet Take 200-400 mg by mouth every 8 (eight) hours as needed (pain.).     metoprolol succinate (TOPROL XL) 25 MG 24 hr tablet Take 12.5 mg by mouth 2 (two) times daily.     Multiple Vitamin (MULTIVITAMIN WITH MINERALS) TABS tablet Take 1 tablet by mouth daily.     polyethylene glycol (MIRALAX / GLYCOLAX) 17 g packet Take 17 g by mouth daily as needed for moderate constipation.     Probiotic Product (PROBIOTIC DAILY PO) Take 1 capsule by mouth daily.     senna (SENOKOT) 8.6 MG tablet Take 1 tablet by mouth daily as needed for constipation.     SYNTHROID 75 MCG tablet TAKE ONE TABLET EVERY MORNING ON AN EMPTY STOMACH 30 tablet 3   Turmeric (QC TUMERIC COMPLEX PO) Take 1,500 mg by mouth daily.     zolpidem (AMBIEN) 10 MG tablet Take 2.5 mg by mouth at bedtime as needed for sleep.      Zoster Vaccine Adjuvanted [facility]) injection Inject 0.5 mLs into the muscle. 0.5 mL 0    metoprolol tartrate (LOPRESSOR) 25 MG tablet Take 0.5 tablets (12.5 mg total) by mouth 2 (two) times daily. (Patient not taking: Reported on 12/21/2023) 90 tablet 3   No current facility-administered medications for this visit.     Review of Systems    She denies chest pain, palpitations, dyspnea, pnd, orthopnea, n, v, dizziness, syncope, edema, weight gain, or early satiety. All other systems reviewed and are otherwise negative except as noted above.   Physical Exam    VS:  BP 138/80 (BP Location: Left Arm, Patient Position: Sitting, Cuff Size: Normal)   Pulse (!) 40   Ht 5\' 3"  (1.6 m)   Wt 134 lb 3.2 oz (60.9 kg)   SpO2 96%   BMI 23.77 kg/m   GEN: Well nourished, well developed, in no acute distress. HEENT: normal. Neck: Supple, no JVD, carotid bruits, or masses. Cardiac: RRR, no murmurs, rubs, or gallops. No clubbing, cyanosis, edema.  Radials/DP/PT 2+ and equal bilaterally.  Respiratory:  Respirations regular and unlabored, clear to auscultation bilaterally. GI: Soft, nontender, nondistended, BS + x 4. MS: no deformity or atrophy. Skin: warm and dry, no rash. Neuro:  Strength and sensation are intact. Psych: Normal affect.  Accessory Clinical Findings    ECG personally reviewed by me today - EKG Interpretation Date/Time:  Thursday December 21 2023 08:59:16 EST Ventricular Rate:  40 PR Interval:  176 QRS Duration:  78 QT Interval:  496 QTC Calculation: 404 R Axis:   -4  Text Interpretation: Marked sinus bradycardia When compared with ECG of 03-Oct-2018 10:21, PREVIOUS ECG IS PRESENT Confirmed by Bernadene Person ([id number]) on 12/21/2023 9:05:43 AM  - no acute changes.   Lab Results  Component Value Date   WBC 5.8 12/11/2023   HGB 13.8 12/11/2023   HCT 42.6 12/11/2023   MCV 103.6 (H) 12/11/2023   PLT 243 12/11/2023   Lab Results  Component Value Date   CREATININE 0.91 12/11/2023   BUN 16 12/11/2023   NA 141 12/11/2023   K 4.3 12/11/2023   CL 108 12/11/2023   CO2 29  12/11/2023   Lab Results  Component Value Date   ALT 13 12/11/2023   AST 19 12/11/2023   ALKPHOS 75 12/11/2023   BILITOT 0.6 12/11/2023   Lab Results  Component Value  Date   CHOL 165 01/27/2023   HDL 81 01/27/2023   LDLCALC 73 01/27/2023   TRIG 54 01/27/2023   CHOLHDL 2.0 01/27/2023    No results found for: "HGBA1C"  Assessment & Plan    1. CAD: Coronary CT performed in 2011 while pt was living in [place] per pt revealed some diagonal stenosis (records not available for review).  Stable with no anginal symptoms.  She notes that her mother and brother have both had heart attacks.  Her mother died of a heart attack at the age of 52.  She is interested in possibly repeating a coronary CT angiogram at some point in the future or further risk stratification.  Given she is asymptomatic at this time, and through shared decision making, will defer for now.  Continue Lipitor.   2. Mitral valve regurgitation: Most recent echocardiogram in 2022 showed EF 60 to 65%, normal LV function, no RWMA, normal RV systolic function, mild mitral valve regurgitation, no significant change from prior study.  Asymptomatic.  Euvolemic and well compensated on exam.  Consider repeat echocardiogram as clinically indicated.  3. History of palpitations/bradycardia: EKG today shows sinus bradycardia, HR 40 bpm.  This is her baseline, she is asymptomatic.  Stable on metoprolol.  4. History of PE: Occurred in the postop setting following knee surgery.  No longer on anticoagulation.  5. Hyperlipidemia: LDL was 77 in 04/2023.  Monitor managed per PCP.  Continue Lipitor.  6. Preoperative cardiac exam: According to the Revised Cardiac Risk Index (RCRI), her Perioperative Risk of Major Cardiac Event is (%): 0.4. Her Functional Capacity in METs is: 6.27 according to the [facility] Activity Status Index (DASI). Therefore, based on ACC/AHA guidelines, patient would be at acceptable risk for the planned procedure without further  cardiovascular testing. I will route this recommendation to the requesting party via Epic fax function.  7. Disposition: Follow-up in 4-6 months with Dr. Antoine Poche.       Joylene Grapes, NP 12/21/2023, 9:25 AM

## 2023-12-21 NOTE — Patient Instructions (Signed)
Medication Instructions:  Your physician recommends that you continue on your current medications as directed. Please refer to the Current Medication list given to you today.  *If you need a refill on your cardiac medications before your next appointment, please call your pharmacy*   Lab Work: NONE ordered at this time of appointment     Testing/Procedures: NONE ordered at this time of appointment     Follow-Up: At [facility], you and your health needs are our priority.  As part of our continuing mission to provide you with exceptional heart care, we have created designated Provider Care Teams.  These Care Teams include your primary Cardiologist (physician) and Advanced Practice Providers (APPs -  Physician Assistants and Nurse Practitioners) who all work together to provide you with the care you need, when you need it.  We recommend signing up for the patient portal called "MyChart".  Sign up information is provided on this After Visit Summary.  MyChart is used to connect with patients for Virtual Visits (Telemedicine).  Patients are able to view lab/test results, encounter notes, upcoming appointments, etc.  Non-urgent messages can be sent to your provider as well.   To learn more about what you can do with MyChart, go to [email or web address].    Your next appointment:   4-6 month(s)  Provider:   Rollene Rotunda, MD

## 2024-02-01 NOTE — Progress Notes (Signed)
 PCP - Ardean Larsen, MD Cardiologist - Rollene Rotunda, MD   Clearance 12-21-23 epic Bernadene Person, NP  PPM/ICD -  Device Orders -  Rep Notified -   Chest x-ray -  EKG - 12-21-23 epic Stress Test - 2020 epic ECHO - 09-27-21 epic Cardiac Cath -   Sleep Study -  CPAP -   Fasting Blood Sugar -  Checks Blood Sugar _____ times a day  Blood Thinner Instructions: Aspirin Instructions:  ERAS Protcol - PRE-SURGERY Ensure     COVID vaccine -yes  Activity--Able to climb a flight of stairs with no CP or SOB Anesthesia review: CAD, PE, Sinus brady,mild MVR,  Patient denies shortness of breath, fever, cough and chest pain at PAT appointment   All instructions explained to the patient, with a verbal understanding of the material. Patient agrees to go over the instructions while at home for a better understanding. Patient also instructed to self quarantine after being tested for COVID-19. The opportunity to ask questions was provided.

## 2024-02-01 NOTE — Patient Instructions (Signed)
 SURGICAL WAITING ROOM VISITATION  Patients having surgery or a procedure may have no more than 2 support people in the waiting area - these visitors may rotate.    Children under the age of 58 must have an adult with them who is not the patient.  Due to an increase in RSV and influenza rates and associated hospitalizations, children ages 75 and under may not visit patients in [facility] hospitals.  Visitors with respiratory illnesses are discouraged from visiting and should remain at home.  If the patient needs to stay at the hospital during part of their recovery, the visitor guidelines for inpatient rooms apply. Pre-op nurse will coordinate an appropriate time for 1 support person to accompany patient in pre-op.  This support person may not rotate.    Please refer to the [facility] website for the visitor guidelines for Inpatients (after your surgery is over and you are in a regular room).       Your procedure is scheduled on: 02-13-24   Report to [facility] Main Entrance    Report to admitting at     0900 AM   Call this number if you have problems the morning of surgery [phone]   Do not eat food :After Midnight.   After Midnight you may have the following liquids until _0830_____ AM/ DAY OF SURGERY  then nothing by mouth  Water Non-Citrus Juices (without pulp, NO RED-Apple, White grape, White cranberry) Black Coffee (NO MILK/CREAM OR CREAMERS, sugar ok)  Clear Tea (NO MILK/CREAM OR CREAMERS, sugar ok) regular and decaf                             Plain Jell-O (NO RED)                                           Fruit ices (not with fruit pulp, NO RED)                                     Popsicles (NO RED)                                                               Sports drinks like Gatorade (NO RED)                      The day of surgery:  Drink ONE (1) Pre-Surgery Clear Ensure BY 0830  AM the morning of surgery. Drink in one sitting. Do not sip.  This  drink was given to you during your hospital  pre-op appointment visit. Nothing else to drink after completing the  Pre-Surgery Clear Ensure .          If you have questions, please contact your surgeon's office.   FOLLOW  ANY ADDITIONAL PRE OP INSTRUCTIONS YOU RECEIVED FROM YOUR SURGEON'S OFFICE!!!     Oral Hygiene is also important to reduce your risk of infection.  Remember - BRUSH YOUR TEETH THE MORNING OF SURGERY WITH YOUR REGULAR TOOTHPASTE  DENTURES WILL BE REMOVED PRIOR TO SURGERY PLEASE DO NOT APPLY "Poly grip" OR ADHESIVES!!!   Do NOT smoke after Midnight   Stop all vitamins and herbal supplements 7 days before surgery.   Take these medicines the morning of surgery with A SIP OF WATER: Synthroid, metoprolol, flonase, wellbutrin, atorvastatin                                 You may not have any metal on your body including hair pins, jewelry, and body piercing             Do not wear make-up, lotions, powders, perfumes/, or deodorant  Do not wear nail polish including gel and S&S, artificial/acrylic nails, or any other type of covering on natural nails including finger and toenails. If you have artificial nails, gel coating, etc. that needs to be removed by a nail salon please have this removed prior to surgery or surgery may need to be canceled/ delayed if the surgeon/ anesthesia feels like they are unable to be safely monitored.   Do not shave  48 hours prior to surgery.                 Do not bring valuables to the hospital. [employer] IS NOT             RESPONSIBLE   FOR VALUABLES.   Contacts, glasses, dentures or bridgework may not be worn into surgery.   Bring small overnight bag day of surgery.   DO NOT BRING YOUR HOME MEDICATIONS TO THE HOSPITAL. PHARMACY WILL DISPENSE MEDICATIONS LISTED ON YOUR MEDICATION LIST TO YOU DURING YOUR ADMISSION IN THE HOSPITAL!    Patients discharged on the day of surgery will not be allowed  to drive home.  Someone NEEDS to stay with you for the first 24 hours after anesthesia.   Special Instructions: Bring a copy of your healthcare power of attorney and living will documents the day of surgery if you haven't scanned them before.              Please read over the following fact sheets you were given: IF YOU HAVE QUESTIONS ABOUT YOUR PRE-OP INSTRUCTIONS PLEASE CALL [phone]   . If you test positive for Covid or have been in contact with anyone that has tested positive in the last 10 days please notify you surgeon.      Pre-operative 5 CHG Bath Instructions   You can play a key role in reducing the risk of infection after surgery. Your skin needs to be as free of germs as possible. You can reduce the number of germs on your skin by washing with CHG (chlorhexidine gluconate) soap before surgery. CHG is an antiseptic soap that kills germs and continues to kill germs even after washing.   DO NOT use if you have an allergy to chlorhexidine/CHG or antibacterial soaps. If your skin becomes reddened or irritated, stop using the CHG and notify one of our RNs at [phone].   Please shower with the CHG soap starting 4 days before surgery using the following schedule:     Please keep in mind the following:  DO NOT shave, including legs and underarms, starting the day of your first shower.   You may shave your face at any point before/day of surgery.  Place clean sheets on your  bed the day you start using CHG soap. Use a clean washcloth (not used since being washed) for each shower. DO NOT sleep with pets once you start using the CHG.   CHG Shower Instructions:  If you choose to wash your hair and private area, wash first with your normal shampoo/soap.  After you use shampoo/soap, rinse your hair and body thoroughly to remove shampoo/soap residue.  Turn the water OFF and apply about 3 tablespoons (45 ml) of CHG soap to a CLEAN washcloth.  Apply CHG soap ONLY FROM YOUR NECK DOWN  TO YOUR TOES (washing for 3-5 minutes)  DO NOT use CHG soap on face, private areas, open wounds, or sores.  Pay special attention to the area where your surgery is being performed.  If you are having back surgery, having someone wash your back for you may be helpful. Wait 2 minutes after CHG soap is applied, then you may rinse off the CHG soap.  Pat dry with a clean towel  Put on clean clothes/pajamas   If you choose to wear lotion, please use ONLY the CHG-compatible lotions on the back of this paper.     Additional instructions for the day of surgery: DO NOT APPLY any lotions, deodorants, cologne, or perfumes.   Put on clean/comfortable clothes.  Brush your teeth.  Ask your nurse before applying any prescription medications to the skin.      CHG Compatible Lotions   Aveeno Moisturizing lotion  Cetaphil Moisturizing Cream  Cetaphil Moisturizing Lotion  Clairol Herbal Essence Moisturizing Lotion, Dry Skin  Clairol Herbal Essence Moisturizing Lotion, Extra Dry Skin  Clairol Herbal Essence Moisturizing Lotion, Normal Skin  Curel Age Defying Therapeutic Moisturizing Lotion with Alpha Hydroxy  Curel Extreme Care Body Lotion  Curel Soothing Hands Moisturizing Hand Lotion  Curel Therapeutic Moisturizing Cream, Fragrance-Free  Curel Therapeutic Moisturizing Lotion, Fragrance-Free  Curel Therapeutic Moisturizing Lotion, Original Formula  Eucerin Daily Replenishing Lotion  Eucerin Dry Skin Therapy Plus Alpha Hydroxy Crme  Eucerin Dry Skin Therapy Plus Alpha Hydroxy Lotion  Eucerin Original Crme  Eucerin Original Lotion  Eucerin Plus Crme Eucerin Plus Lotion  Eucerin TriLipid Replenishing Lotion  Keri Anti-Bacterial Hand Lotion  Keri Deep Conditioning Original Lotion Dry Skin Formula Softly Scented  Keri Deep Conditioning Original Lotion, Fragrance Free Sensitive Skin Formula  Keri Lotion Fast Absorbing Fragrance Free Sensitive Skin Formula  Keri Lotion Fast Absorbing Softly  Scented Dry Skin Formula  Keri Original Lotion  Keri Skin Renewal Lotion Keri Silky Smooth Lotion  Keri Silky Smooth Sensitive Skin Lotion  Nivea Body Creamy Conditioning Oil  Nivea Body Extra Enriched Teacher, adult education Moisturizing Lotion Nivea Crme  Nivea Skin Firming Lotion  NutraDerm 30 Skin Lotion  NutraDerm Skin Lotion  NutraDerm Therapeutic Skin Cream  NutraDerm Therapeutic Skin Lotion  ProShield Protective Hand Cream  WHAT IS A BLOOD TRANSFUSION? Blood Transfusion Information  A transfusion is the replacement of blood or some of its parts. Blood is made up of multiple cells which provide different functions. Red blood cells carry oxygen and are used for blood loss replacement. White blood cells fight against infection. Platelets control bleeding. Plasma helps clot blood. Other blood products are available for specialized needs, such as hemophilia or other clotting disorders. BEFORE THE TRANSFUSION  Who gives blood for transfusions?  Healthy volunteers who are fully evaluated to make sure their blood is safe. This is blood bank blood. Transfusion therapy is the safest it has ever  been in the practice of medicine. Before blood is taken from a donor, a complete history is taken to make sure that person has no history of diseases nor engages in risky social behavior (examples are intravenous drug use or sexual activity with multiple partners). The donor's travel history is screened to minimize risk of transmitting infections, such as malaria. The donated blood is tested for signs of infectious diseases, such as HIV and hepatitis. The blood is then tested to be sure it is compatible with you in order to minimize the chance of a transfusion reaction. If you or a relative donates blood, this is often done in anticipation of surgery and is not appropriate for emergency situations. It takes many days to process the donated blood. RISKS AND  COMPLICATIONS Although transfusion therapy is very safe and saves many lives, the main dangers of transfusion include:  Getting an infectious disease. Developing a transfusion reaction. This is an allergic reaction to something in the blood you were given. Every precaution is taken to prevent this. The decision to have a blood transfusion has been considered carefully by your caregiver before blood is given. Blood is not given unless the benefits outweigh the risks. AFTER THE TRANSFUSION Right after receiving a blood transfusion, you will usually feel much better and more energetic. This is especially true if your red blood cells have gotten low (anemic). The transfusion raises the level of the red blood cells which carry oxygen, and this usually causes an energy increase. The nurse administering the transfusion will monitor you carefully for complications. HOME CARE INSTRUCTIONS  No special instructions are needed after a transfusion. You may find your energy is better. Speak with your caregiver about any limitations on activity for underlying diseases you may have. SEEK MEDICAL CARE IF:  Your condition is not improving after your transfusion. You develop redness or irritation at the intravenous (IV) site. SEEK IMMEDIATE MEDICAL CARE IF:  Any of the following symptoms occur over the next 12 hours: Shaking chills. You have a temperature by mouth above 102 F (38.9 C), not controlled by medicine. Chest, back, or muscle pain. People around you feel you are not acting correctly or are confused. Shortness of breath or difficulty breathing. Dizziness and fainting. You get a rash or develop hives. You have a decrease in urine output. Your urine turns a dark color or changes to pink, red, or brown. Any of the following symptoms occur over the next 10 days: You have a temperature by mouth above 102 F (38.9 C), not controlled by medicine. Shortness of breath. Weakness after normal activity. The  white part of the eye turns yellow (jaundice). You have a decrease in the amount of urine or are urinating less often. Your urine turns a dark color or changes to pink, red, or brown. Document Released: 11/04/2000 Document Revised: 01/30/2012 Document Reviewed: 06/23/2008 ExitCare Patient Information 2014 [place], [place].  _______________________________________________________________________  Incentive Spirometer  An incentive spirometer is a tool that can help keep your lungs clear and active. This tool measures how well you are filling your lungs with each breath. Taking long deep breaths may help reverse or decrease the chance of developing breathing (pulmonary) problems (especially infection) following: A long period of time when you are unable to move or be active. BEFORE THE PROCEDURE  If the spirometer includes an indicator to show your best effort, your nurse or respiratory therapist will set it to a desired goal. If possible, sit up straight or lean slightly forward.  Try not to slouch. Hold the incentive spirometer in an upright position. INSTRUCTIONS FOR USE  Sit on the edge of your bed if possible, or sit up as far as you can in bed or on a chair. Hold the incentive spirometer in an upright position. Breathe out normally. Place the mouthpiece in your mouth and seal your lips tightly around it. Breathe in slowly and as deeply as possible, raising the piston or the ball toward the top of the column. Hold your breath for 3-5 seconds or for as long as possible. Allow the piston or ball to fall to the bottom of the column. Remove the mouthpiece from your mouth and breathe out normally. Rest for a few seconds and repeat Steps 1 through 7 at least 10 times every 1-2 hours when you are awake. Take your time and take a few normal breaths between deep breaths. The spirometer may include an indicator to show your best effort. Use the indicator as a goal to work toward during each  repetition. After each set of 10 deep breaths, practice coughing to be sure your lungs are clear. If you have an incision (the cut made at the time of surgery), support your incision when coughing by placing a pillow or rolled up towels firmly against it. Once you are able to get out of bed, walk around indoors and cough well. You may stop using the incentive spirometer when instructed by your caregiver.  RISKS AND COMPLICATIONS Take your time so you do not get dizzy or light-headed. If you are in pain, you may need to take or ask for pain medication before doing incentive spirometry. It is harder to take a deep breath if you are having pain. AFTER USE Rest and breathe slowly and easily. It can be helpful to keep track of a log of your progress. Your caregiver can provide you with a simple table to help with this. If you are using the spirometer at home, follow these instructions: SEEK MEDICAL CARE IF:  You are having difficultly using the spirometer. You have trouble using the spirometer as often as instructed. Your pain medication is not giving enough relief while using the spirometer. You develop fever of 100.5 F (38.1 C) or higher. SEEK IMMEDIATE MEDICAL CARE IF:  You cough up bloody sputum that had not been present before. You develop fever of 102 F (38.9 C) or greater. You develop worsening pain at or near the incision site. MAKE SURE YOU:  Understand these instructions. Will watch your condition. Will get help right away if you are not doing well or get worse. Document Released: 03/20/2007 Document Revised: 01/30/2012 Document Reviewed: 05/21/2007 [facility] Patient Information 2014 [place], [place].   ________________________________________________________________________

## 2024-02-02 ENCOUNTER — Encounter (HOSPITAL_COMMUNITY): Payer: Self-pay

## 2024-02-02 ENCOUNTER — Other Ambulatory Visit: Payer: Self-pay

## 2024-02-02 ENCOUNTER — Encounter (HOSPITAL_COMMUNITY)
Admission: RE | Admit: 2024-02-02 | Discharge: 2024-02-02 | Disposition: A | Discharge status: Home / Self Care | Payer: Medicare Other | Source: Ambulatory Visit | Attending: Orthopedic Surgery | Admitting: Orthopedic Surgery

## 2024-02-02 VITALS — BP 144/82 | HR 44 | Temp 97.7°F | Resp 16 | Ht 63.0 in | Wt 134.0 lb

## 2024-02-02 DIAGNOSIS — I10 Essential (primary) hypertension: Secondary | ICD-10-CM | POA: Diagnosis not present

## 2024-02-02 DIAGNOSIS — M1611 Unilateral primary osteoarthritis, right hip: Secondary | ICD-10-CM | POA: Insufficient documentation

## 2024-02-02 DIAGNOSIS — Z01818 Encounter for other preprocedural examination: Secondary | ICD-10-CM

## 2024-02-02 DIAGNOSIS — Z01812 Encounter for preprocedural laboratory examination: Secondary | ICD-10-CM | POA: Insufficient documentation

## 2024-02-02 DIAGNOSIS — I493 Ventricular premature depolarization: Secondary | ICD-10-CM | POA: Insufficient documentation

## 2024-02-02 DIAGNOSIS — Z853 Personal history of malignant neoplasm of breast: Secondary | ICD-10-CM | POA: Diagnosis not present

## 2024-02-02 DIAGNOSIS — Z923 Personal history of irradiation: Secondary | ICD-10-CM | POA: Diagnosis not present

## 2024-02-02 DIAGNOSIS — E0500 Thyrotoxicosis with diffuse goiter without thyrotoxic crisis or storm: Secondary | ICD-10-CM | POA: Insufficient documentation

## 2024-02-02 DIAGNOSIS — Z86711 Personal history of pulmonary embolism: Secondary | ICD-10-CM | POA: Diagnosis not present

## 2024-02-02 HISTORY — DX: Cardiac murmur, unspecified: R01.1

## 2024-02-02 LAB — TYPE AND SCREEN
ABO/RH(D): O POS
Antibody Screen: NEGATIVE

## 2024-02-02 LAB — BASIC METABOLIC PANEL
Anion gap: 9 (ref 5–15)
BUN: 21 mg/dL (ref 8–23)
CO2: 24 mmol/L (ref 22–32)
Calcium: 9.2 mg/dL (ref 8.9–10.3)
Chloride: 107 mmol/L (ref 98–111)
Creatinine, Ser: 0.87 mg/dL (ref 0.44–1.00)
GFR, Estimated: >60 mL/min (ref >60)
Glucose, Bld: 82 mg/dL (ref 70–99)
Potassium: 4.1 mmol/L (ref 3.5–5.1)
Sodium: 140 mmol/L (ref 135–145)

## 2024-02-02 LAB — SURGICAL PCR SCREEN
MRSA, PCR: NEGATIVE
Staphylococcus aureus: POSITIVE — AB

## 2024-02-02 LAB — CBC
HCT: 43.2 % (ref 36.0–46.0)
Hemoglobin: 14.2 g/dL (ref 12.0–15.0)
MCH: 33.4 pg (ref 26.0–34.0)
MCHC: 32.9 g/dL (ref 30.0–36.0)
MCV: 101.6 fL — ABNORMAL HIGH (ref 80.0–100.0)
Platelets: 238 10*3/uL (ref 150–400)
RBC: 4.25 MIL/uL (ref 3.87–5.11)
RDW: 12.4 % (ref 11.5–15.5)
WBC: 6 10*3/uL (ref 4.0–10.5)
nRBC: 0 % (ref 0.0–0.2)

## 2024-02-05 ENCOUNTER — Other Ambulatory Visit: Payer: Self-pay

## 2024-02-05 MED ORDER — ATORVASTATIN CALCIUM 20 MG PO TABS: ORAL_TABLET | ORAL | 2 refills | Status: DC

## 2024-02-05 MED ORDER — ATORVASTATIN CALCIUM 10 MG PO TABS: ORAL_TABLET | ORAL | 2 refills | Status: DC

## 2024-02-05 NOTE — Progress Notes (Signed)
 Anesthesia Chart Review   Case: [id number] Date/Time: 02/13/24 1115   Procedure: ARTHROPLASTY, HIP, TOTAL, ANTERIOR APPROACH (Right: Hip)   Anesthesia type: Spinal   Pre-op diagnosis: Right hip osteoarthritis   Location: [facility] ROOM 10 / [facility]   Surgeons: Durene Romans, MD       DISCUSSION:78 y.o. never smoker with h/o PONV, HTN, PVC, Grave's Disease, PE, right sided breast cancer s/p lumpectomy and radiation, right hip OA scheduled for above procedure 02/13/2024 with Dr. Durene Romans.   Pt seen by cardiology 12/21/2023 for preoperative evaluation.  Per OV note, "According to the Revised Cardiac Risk Index (RCRI), her Perioperative Risk of Major Cardiac Event is (%): 0.4. Her Functional Capacity in METs is: 6.27 according to the Duke Activity Status Index (DASI). Therefore, based on ACC/AHA guidelines, patient would be at acceptable risk for the planned procedure without further cardiovascular testing. I will route this recommendation to the requesting party via Epic fax function. "  VS: BP (!) 144/82   Pulse (!) 44   Temp 36.5 C (Oral)   Resp 16   Ht 5\' 3"  (1.6 m)   Wt 60.8 kg   SpO2 100%   BMI 23.74 kg/m   PROVIDERS: Pahwani, Kasandra Knudsen, MD is PCP   Cardiologist - Rollene Rotunda, MD   LABS: Labs reviewed: Acceptable for surgery. (all labs ordered are listed, but only abnormal results are displayed)  Labs Reviewed  SURGICAL PCR SCREEN - Abnormal; Notable for the following components:      Result Value   Staphylococcus aureus POSITIVE (*)    All other components within normal limits  CBC - Abnormal; Notable for the following components:   MCV 101.6 (*)    All other components within normal limits  BASIC METABOLIC PANEL  TYPE AND SCREEN     IMAGES:   EKG:   CV: Echo 09/27/2021 1. Left ventricular ejection fraction, by estimation, is 60 to 65%. The  left ventricle has normal function. The left ventricle has no regional  wall motion abnormalities. Left ventricular  diastolic parameters were  normal.   2. Right ventricular systolic function is normal. The right ventricular  size is normal. There is normal pulmonary artery systolic pressure. The  estimated right ventricular systolic pressure is 23.8 mmHg.   3. The mitral valve is normal in structure. Mild mitral valve  regurgitation. No evidence of mitral stenosis.   4. The aortic valve is tricuspid. Aortic valve regurgitation is trivial.  No aortic stenosis is present.   5. Pulmonic valve regurgitation is moderate.   6. The inferior vena cava is normal in size with greater than 50%  respiratory variability, suggesting right atrial pressure of 3 mmHg.  Past Medical History:  Diagnosis Date   Allergic rhinitis    Allergy    Arthritis    "knees" (08/28/2018)   Breast cancer, right breast (HCC) 05/20/2013   "no chemo" (08/28/2018)   Chronic lower back pain    Clotting disorder (HCC)    Diverticulosis    DOE (dyspnea on exertion)    Dysrhythmia    PVC   GERD (gastroesophageal reflux disease)    uses levsin once every 2-3 months   Graves' disease    2011   Heart murmur    slight   Heart palpitations    History of kidney stones    HLD (hyperlipidemia)    Hx of radiation therapy 08/13/13- 09/06/13   right breast 4250 cGy 17 sessions   Hypertension  controlled   IBS (irritable bowel syndrome)    Lumbar herniated disc    L4-L5   Major depression    MVP (mitral valve prolapse)    Osteopenia    Osteoporosis    zometa infusion 11/2021- is yearly infusion   Personal history of radiation therapy    Pneumonia 08/2019   PONV (postoperative nausea and vomiting)    "when I have general anesthetic" (08/28/2018)   Pulmonary emboli (HCC)    after knee replacement   PVC (premature ventricular contraction)    Takes metoprolol PRN for  pvc's   Tubular adenoma of colon 03/07/2012   Uterine hyperplasia    vaginal [place] every 6 months and had 2 biopsies so far with chnages noted    Past Surgical  History:  Procedure Laterality Date   BREAST BIOPSY Right 2014   BREAST EXCISIONAL BIOPSY Right    BREAST LUMPECTOMY Right 2014   BREAST LUMPECTOMY WITH NEEDLE LOCALIZATION AND AXILLARY SENTINEL LYMPH NODE BX Right 06/11/2013   Procedure: BREAST LUMPECTOMY WITH NEEDLE LOCALIZATION AND AXILLARY SENTINEL LYMPH NODE BX;  Surgeon: Shelly Rubenstein, MD;  Location: [facility];  Service: General;  Laterality: Right;  Needle localization BCG 7:30   COLONOSCOPY     COLONOSCOPY W/ BIOPSIES AND POLYPECTOMY  multiple   CYSTOSCOPY WITH STENT PLACEMENT Left 08/15/2019   Procedure: CYSTOSCOPY, left retrograde pyleogram  WITH STENT EXCHANGE left and foley placement;  Surgeon: Marcine Matar, MD;  [facility];  Service: Urology;  Laterality: Left;   IR URETERAL STENT LEFT NEW ACCESS W/O SEP NEPHROSTOMY CATH  08/12/2019   IR URETERAL STENT LEFT NEW ACCESS W/O SEP NEPHROSTOMY CATH  12/09/2021   KNEE ARTHROSCOPY Left 2006   NEPHROLITHOTOMY Left 08/12/2019   Procedure: NEPHROLITHOTOMY PERCUTANEOUS/INSERTION DOUBLE J STENT;  Surgeon: Marcine Matar, MD;  Location[facility];  Service: Urology;  Laterality: Left;  2 HRS   NEPHROLITHOTOMY Left 12/09/2021   Procedure: NEPHROLITHOTOMY PERCUTANEOUS;  Surgeon: Marcine Matar, MD;  [facility];  Service: Urology;  Laterality: Left;  2 HRS   POLYPECTOMY     TOTAL KNEE ARTHROPLASTY Right 08/28/2018   Procedure: RIGHT TOTAL KNEE ARTHROPLASTY;  Surgeon: Valeria Batman, MD;  Location: [facility];  Service: Orthopedics;  Laterality: Right;   uterine biopsy     x 2 with no sedation    MEDICATIONS:  atorvastatin (LIPITOR) 10 MG tablet   atorvastatin (LIPITOR) 20 MG tablet   buPROPion (WELLBUTRIN XL) 300 MG 24 hr tablet   Calcium Citrate-Vitamin D (CALCIUM CITRATE + D) 250-5 MG-MCG TABS   COVID-19 mRNA vaccine, Pfizer, (COMIRNATY) syringe   diclofenac (VOLTAREN) 75 MG EC tablet   ECHINACEA PO   FIBER PO   fluticasone (FLONASE) 50 MCG/ACT nasal spray    hyoscyamine (LEVSIN SL) 0.125 MG SL tablet   ibuprofen (ADVIL) 200 MG tablet   metoprolol tartrate (LOPRESSOR) 25 MG tablet   polyethylene glycol (MIRALAX / GLYCOLAX) 17 g packet   senna (SENOKOT) 8.6 MG tablet   SYNTHROID 75 MCG tablet   zolpidem (AMBIEN) 10 MG tablet   Zoster Vaccine Adjuvanted [facility]) injection   No current facility-administered medications for this encounter.    Jodell Cipro Ward, PA-C [facility]Surgical Testing [phone]

## 2024-02-05 NOTE — Progress Notes (Addendum)
 Patient's PCR screen is positive for STAPH. Appropriate notes have been placed on the patient's chart. This note has been routed to Dr. Charlann Boxer and Rosalene Billings, PA for review. The Patient's surgery is currently scheduled for: 02-13-2024  at [facility].  Rudean Haskell, BSN, CVRN-BC   Pre-Surgical Testing Nurse [facility]  [phone]

## 2024-02-06 ENCOUNTER — Ambulatory Visit: Payer: Medicare Other | Admitting: Cardiology

## 2024-02-12 NOTE — Anesthesia Preprocedure Evaluation (Signed)
 Anesthesia Evaluation  Patient identified by MRN, date of birth, ID band Patient awake    Reviewed: Allergy & Precautions, NPO status , Patient's Chart, lab work & pertinent test results  History of Anesthesia Complications (+) PONV and history of anesthetic complications  Airway Mallampati: III  TM Distance: >3 FB Neck ROM: Full   Comment: Previous grade III view with Miller 3, easy mask. Previous grade II view with MAC 3. Dental  (+) Dental Advisory Given   Pulmonary neg shortness of breath, neg sleep apnea, neg COPD, neg recent URI, PE (after knee replacement)   Pulmonary exam normal breath sounds clear to auscultation       Cardiovascular hypertension (metoprolol), Pt. on home beta blockers (-) angina (-) Past MI, (-) Cardiac Stents and (-) CABG + dysrhythmias (PVCs) + Valvular Problems/Murmurs MVP and MR  Rhythm:Regular Rate:Normal  HLD  TTE 09/27/2021: IMPRESSIONS    1. Left ventricular ejection fraction, by estimation, is 60 to 65%. The  left ventricle has normal function. The left ventricle has no regional  wall motion abnormalities. Left ventricular diastolic parameters were  normal.   2. Right ventricular systolic function is normal. The right ventricular  size is normal. There is normal pulmonary artery systolic pressure. The  estimated right ventricular systolic pressure is 23.8 mmHg.   3. The mitral valve is normal in structure. Mild mitral valve  regurgitation. No evidence of mitral stenosis.   4. The aortic valve is tricuspid. Aortic valve regurgitation is trivial.  No aortic stenosis is present.   5. Pulmonic valve regurgitation is moderate.   6. The inferior vena cava is normal in size with greater than 50%  respiratory variability, suggesting right atrial pressure of 3 mmHg.     Neuro/Psych neg Seizures PSYCHIATRIC DISORDERS  Depression     Neuromuscular disease (herniated disc at L4-5)    GI/Hepatic Neg  liver ROS,GERD  ,,Diverticulosis    Endo/Other  neg diabetesHypothyroidism    Renal/GU negative Renal ROS     Musculoskeletal  (+) Arthritis , Osteoarthritis,  Osteoporosis    Abdominal   Peds  Hematology  (+) Blood dyscrasia, anemia Lab Results      Component                Value               Date                      WBC                      6.0                 02/02/2024                HGB                      14.2                02/02/2024                HCT                      43.2                02/02/2024                MCV  101.6 (H)           02/02/2024                PLT                      238                 02/02/2024              Anesthesia Other Findings   Reproductive/Obstetrics H/o right breast cancer                             Anesthesia Physical Anesthesia Plan  ASA: 3  Anesthesia Plan: MAC and Spinal   Post-op Pain Management: Tylenol PO (pre-op)*   Induction: Intravenous  PONV Risk Score and Plan: 3 and Ondansetron, Dexamethasone, Treatment may vary due to age or medical condition and Propofol infusion  Airway Management Planned: Natural Airway and Simple Face Mask  Additional Equipment:   Intra-op Plan:   Post-operative Plan:   Informed Consent: I have reviewed the patients History and Physical, chart, labs and discussed the procedure including the risks, benefits and alternatives for the proposed anesthesia with the patient or authorized representative who has indicated his/her understanding and acceptance.     Dental advisory given  Plan Discussed with: CRNA and Anesthesiologist  Anesthesia Plan Comments: (I have discussed risks of neuraxial anesthesia including but not limited to infection, bleeding, nerve injury, back pain, headache, seizures, and failure of block. Patient denies bleeding disorders and is not currently anticoagulated. Labs have been reviewed. Risks and benefits  discussed. All patient's questions answered.   Discussed with patient risks of MAC including, but not limited to, minor pain or discomfort, hearing people in the room, and possible need for backup general anesthesia. Risks for general anesthesia also discussed including, but not limited to, sore throat, hoarse voice, chipped/damaged teeth, injury to vocal cords, nausea and vomiting, allergic reactions, lung infection, heart attack, stroke, and death. All questions answered. )        Anesthesia Quick Evaluation

## 2024-02-13 ENCOUNTER — Encounter (HOSPITAL_COMMUNITY): Payer: Self-pay | Admitting: Orthopedic Surgery

## 2024-02-13 ENCOUNTER — Ambulatory Visit (HOSPITAL_COMMUNITY)

## 2024-02-13 ENCOUNTER — Ambulatory Visit (HOSPITAL_COMMUNITY): Payer: Self-pay | Admitting: Physician Assistant

## 2024-02-13 ENCOUNTER — Ambulatory Visit (HOSPITAL_COMMUNITY)
Admission: RE | Admit: 2024-02-13 | Discharge: 2024-02-13 | Disposition: A | Discharge status: Home / Self Care | Payer: Medicare Other | Source: Ambulatory Visit | Attending: Orthopedic Surgery | Admitting: Orthopedic Surgery

## 2024-02-13 ENCOUNTER — Other Ambulatory Visit: Payer: Self-pay

## 2024-02-13 ENCOUNTER — Ambulatory Visit (HOSPITAL_COMMUNITY): Admitting: Anesthesiology

## 2024-02-13 ENCOUNTER — Encounter (HOSPITAL_COMMUNITY)
Admission: RE | Disposition: A | Discharge status: Home / Self Care | Payer: Self-pay | Source: Ambulatory Visit | Attending: Orthopedic Surgery

## 2024-02-13 DIAGNOSIS — M1611 Unilateral primary osteoarthritis, right hip: Secondary | ICD-10-CM

## 2024-02-13 DIAGNOSIS — Z96641 Presence of right artificial hip joint: Secondary | ICD-10-CM

## 2024-02-13 DIAGNOSIS — I493 Ventricular premature depolarization: Secondary | ICD-10-CM | POA: Insufficient documentation

## 2024-02-13 DIAGNOSIS — M170 Bilateral primary osteoarthritis of knee: Secondary | ICD-10-CM | POA: Diagnosis not present

## 2024-02-13 DIAGNOSIS — I341 Nonrheumatic mitral (valve) prolapse: Secondary | ICD-10-CM | POA: Diagnosis not present

## 2024-02-13 DIAGNOSIS — I10 Essential (primary) hypertension: Secondary | ICD-10-CM | POA: Diagnosis not present

## 2024-02-13 DIAGNOSIS — I340 Nonrheumatic mitral (valve) insufficiency: Secondary | ICD-10-CM | POA: Insufficient documentation

## 2024-02-13 DIAGNOSIS — E039 Hypothyroidism, unspecified: Secondary | ICD-10-CM | POA: Diagnosis not present

## 2024-02-13 DIAGNOSIS — F32A Depression, unspecified: Secondary | ICD-10-CM | POA: Insufficient documentation

## 2024-02-13 DIAGNOSIS — Z86711 Personal history of pulmonary embolism: Secondary | ICD-10-CM | POA: Insufficient documentation

## 2024-02-13 HISTORY — PX: TOTAL HIP ARTHROPLASTY: SHX124

## 2024-02-13 SURGERY — ARTHROPLASTY, HIP, TOTAL, ANTERIOR APPROACH
Anesthesia: Monitor Anesthesia Care | Site: Hip | Laterality: Right

## 2024-02-13 MED ORDER — BUPIVACAINE-EPINEPHRINE (PF) 0.25% -1:200000 IJ SOLN
INTRAMUSCULAR | Status: AC
  Filled 2024-02-13: qty 30

## 2024-02-13 MED ORDER — METOCLOPRAMIDE HCL 5 MG/ML IJ SOLN: 5.0000 mg | Freq: Three times a day (TID) | INTRAMUSCULAR | Status: DC | PRN

## 2024-02-13 MED ORDER — EPHEDRINE SULFATE (PRESSORS) 50 MG/ML IJ SOLN
INTRAMUSCULAR | Status: DC | PRN
  Administered 2024-02-13: 10 mg via INTRAVENOUS

## 2024-02-13 MED ORDER — OXYCODONE HCL 5 MG PO TABS: 10.0000 mg | ORAL_TABLET | ORAL | Status: DC | PRN

## 2024-02-13 MED ORDER — CEFAZOLIN SODIUM-DEXTROSE 2-4 GM/100ML-% IV SOLN
2.0000 g | INTRAVENOUS | Status: AC
Start: 2024-02-13 — End: 2024-02-13
  Administered 2024-02-13: 2 g via INTRAVENOUS
  Filled 2024-02-13: qty 100

## 2024-02-13 MED ORDER — HYDROMORPHONE HCL 1 MG/ML IJ SOLN: 0.5000 mg | INTRAMUSCULAR | Status: DC | PRN

## 2024-02-13 MED ORDER — CEFAZOLIN SODIUM-DEXTROSE 2-4 GM/100ML-% IV SOLN: 2.0000 g | Freq: Four times a day (QID) | INTRAVENOUS | Status: DC

## 2024-02-13 MED ORDER — BUPIVACAINE IN DEXTROSE 0.75-8.25 % IT SOLN
INTRATHECAL | Status: DC | PRN
  Administered 2024-02-13: 1.6 mL via INTRATHECAL

## 2024-02-13 MED ORDER — CHLORHEXIDINE GLUCONATE 0.12 % MT SOLN
15.0000 mL | Freq: Once | OROMUCOSAL | Status: AC
  Administered 2024-02-13: 15 mL via OROMUCOSAL

## 2024-02-13 MED ORDER — ORAL CARE MOUTH RINSE: 15.0000 mL | Freq: Once | OROMUCOSAL | Status: AC

## 2024-02-13 MED ORDER — PROPOFOL 10 MG/ML IV BOLUS
INTRAVENOUS | Status: AC
  Filled 2024-02-13: qty 20

## 2024-02-13 MED ORDER — TRANEXAMIC ACID-NACL 1000-0.7 MG/100ML-% IV SOLN
INTRAVENOUS | Status: DC | PRN
  Administered 2024-02-13: 1000 mg via INTRAVENOUS

## 2024-02-13 MED ORDER — METHOCARBAMOL 500 MG PO TABS: 500.0000 mg | ORAL_TABLET | Freq: Four times a day (QID) | ORAL | Status: DC | PRN

## 2024-02-13 MED ORDER — ONDANSETRON HCL 4 MG/2ML IJ SOLN: 4.0000 mg | Freq: Four times a day (QID) | INTRAMUSCULAR | Status: DC | PRN

## 2024-02-13 MED ORDER — OXYCODONE HCL 5 MG PO TABS: 5.0000 mg | ORAL_TABLET | Freq: Once | ORAL | Status: DC | PRN

## 2024-02-13 MED ORDER — OXYCODONE HCL 5 MG/5ML PO SOLN: 5.0000 mg | Freq: Once | ORAL | Status: DC | PRN

## 2024-02-13 MED ORDER — OXYCODONE HCL 5 MG PO TABS
ORAL_TABLET | ORAL | Status: AC
  Administered 2024-02-13: 5 mg via ORAL
  Filled 2024-02-13: qty 1

## 2024-02-13 MED ORDER — CHLORHEXIDINE GLUCONATE 4 % EX SOLN: 1.0000 | CUTANEOUS | 1 refills | Status: DC

## 2024-02-13 MED ORDER — FENTANYL CITRATE (PF) 100 MCG/2ML IJ SOLN
INTRAMUSCULAR | Status: AC
  Filled 2024-02-13: qty 2

## 2024-02-13 MED ORDER — SODIUM CHLORIDE (PF) 0.9 % IJ SOLN
INTRAMUSCULAR | Status: DC | PRN
  Administered 2024-02-13: 61 mL

## 2024-02-13 MED ORDER — AMISULPRIDE (ANTIEMETIC) 5 MG/2ML IV SOLN: 10.0000 mg | Freq: Once | INTRAVENOUS | Status: DC | PRN

## 2024-02-13 MED ORDER — PROPOFOL 500 MG/50ML IV EMUL
INTRAVENOUS | Status: DC | PRN
  Administered 2024-02-13: 75 ug/kg/min via INTRAVENOUS

## 2024-02-13 MED ORDER — SODIUM CHLORIDE 0.9% FLUSH: 3.0000 mL | INTRAVENOUS | Status: DC | PRN

## 2024-02-13 MED ORDER — ONDANSETRON HCL 4 MG/2ML IJ SOLN
INTRAMUSCULAR | Status: DC | PRN
  Administered 2024-02-13: 4 mg via INTRAVENOUS

## 2024-02-13 MED ORDER — TRANEXAMIC ACID-NACL 1000-0.7 MG/100ML-% IV SOLN
INTRAVENOUS | Status: AC
  Filled 2024-02-13: qty 100

## 2024-02-13 MED ORDER — GLYCOPYRROLATE 0.2 MG/ML IJ SOLN
INTRAMUSCULAR | Status: DC | PRN
  Administered 2024-02-13: .2 mg via INTRAVENOUS

## 2024-02-13 MED ORDER — KETOROLAC TROMETHAMINE 30 MG/ML IJ SOLN
INTRAMUSCULAR | Status: AC
  Filled 2024-02-13: qty 1

## 2024-02-13 MED ORDER — MUPIROCIN 2 % EX OINT: 1.0000 | TOPICAL_OINTMENT | Freq: Two times a day (BID) | CUTANEOUS | 0 refills | Status: AC

## 2024-02-13 MED ORDER — SODIUM CHLORIDE 0.9% FLUSH: 3.0000 mL | Freq: Two times a day (BID) | INTRAVENOUS | Status: DC

## 2024-02-13 MED ORDER — TRANEXAMIC ACID-NACL 1000-0.7 MG/100ML-% IV SOLN
1000.0000 mg | Freq: Once | INTRAVENOUS | Status: AC
  Administered 2024-02-13: 1000 mg via INTRAVENOUS

## 2024-02-13 MED ORDER — METHOCARBAMOL 1000 MG/10ML IJ SOLN: 500.0000 mg | Freq: Four times a day (QID) | INTRAMUSCULAR | Status: DC | PRN

## 2024-02-13 MED ORDER — POVIDONE-IODINE 10 % EX SWAB: 2.0000 | Freq: Once | CUTANEOUS | Status: DC

## 2024-02-13 MED ORDER — DEXAMETHASONE SODIUM PHOSPHATE 10 MG/ML IJ SOLN
8.0000 mg | Freq: Once | INTRAMUSCULAR | Status: AC
  Administered 2024-02-13: 4 mg via INTRAVENOUS

## 2024-02-13 MED ORDER — OXYCODONE HCL 5 MG PO TABS: 5.0000 mg | ORAL_TABLET | ORAL | Status: DC | PRN

## 2024-02-13 MED ORDER — ACETAMINOPHEN 500 MG PO TABS
1000.0000 mg | ORAL_TABLET | Freq: Once | ORAL | Status: AC
  Administered 2024-02-13: 1000 mg via ORAL
  Filled 2024-02-13: qty 2

## 2024-02-13 MED ORDER — LACTATED RINGERS IV SOLN: INTRAVENOUS | Status: DC | PRN

## 2024-02-13 MED ORDER — OXYCODONE HCL 5 MG PO TABS
ORAL_TABLET | ORAL | Status: AC
  Filled 2024-02-13: qty 2

## 2024-02-13 MED ORDER — 0.9 % SODIUM CHLORIDE (POUR BTL) OPTIME
TOPICAL | Status: DC | PRN
  Administered 2024-02-13: 1000 mL

## 2024-02-13 MED ORDER — TRANEXAMIC ACID-NACL 1000-0.7 MG/100ML-% IV SOLN
1000.0000 mg | INTRAVENOUS | Status: DC
  Filled 2024-02-13: qty 100

## 2024-02-13 MED ORDER — FENTANYL CITRATE PF 50 MCG/ML IJ SOSY: 25.0000 ug | PREFILLED_SYRINGE | INTRAMUSCULAR | Status: DC | PRN

## 2024-02-13 MED ORDER — SODIUM CHLORIDE (PF) 0.9 % IJ SOLN
INTRAMUSCULAR | Status: AC
  Filled 2024-02-13: qty 30

## 2024-02-13 MED ORDER — CEFAZOLIN SODIUM-DEXTROSE 2-4 GM/100ML-% IV SOLN
INTRAVENOUS | Status: AC
  Filled 2024-02-13: qty 100

## 2024-02-13 MED ORDER — PHENYLEPHRINE HCL-NACL 20-0.9 MG/250ML-% IV SOLN
INTRAVENOUS | Status: DC | PRN
  Administered 2024-02-13: 25 ug/min via INTRAVENOUS

## 2024-02-13 MED ORDER — FENTANYL CITRATE (PF) 100 MCG/2ML IJ SOLN
INTRAMUSCULAR | Status: DC | PRN
  Administered 2024-02-13: 50 ug via INTRAVENOUS

## 2024-02-13 MED ORDER — STERILE WATER FOR IRRIGATION IR SOLN
Status: DC | PRN
  Administered 2024-02-13: 1000 mL

## 2024-02-13 MED ORDER — ONDANSETRON HCL 4 MG PO TABS: 4.0000 mg | ORAL_TABLET | Freq: Four times a day (QID) | ORAL | Status: DC | PRN

## 2024-02-13 MED ORDER — METOCLOPRAMIDE HCL 5 MG PO TABS: 5.0000 mg | ORAL_TABLET | Freq: Three times a day (TID) | ORAL | Status: DC | PRN

## 2024-02-13 SURGICAL SUPPLY — 40 items
BAG COUNTER SPONGE SURGICOUNT (BAG) IMPLANT
BAG ZIPLOCK 12X15 (MISCELLANEOUS) IMPLANT
BLADE SAG 18X100X1.27 (BLADE) ×1 IMPLANT
COVER PERINEAL POST (MISCELLANEOUS) ×1 IMPLANT
COVER SURGICAL LIGHT HANDLE (MISCELLANEOUS) ×1 IMPLANT
CUP ACET PINNACLE SECTR 50MM (Hips) IMPLANT
DERMABOND ADVANCED .7 DNX12 (GAUZE/BANDAGES/DRESSINGS) ×1 IMPLANT
DRAPE FOOT SWITCH (DRAPES) ×1 IMPLANT
DRAPE STERI IOBAN 125X83 (DRAPES) ×1 IMPLANT
DRAPE U-SHAPE 47X51 STRL (DRAPES) ×2 IMPLANT
DRESSING AQUACEL AG SP 3.5X10 (GAUZE/BANDAGES/DRESSINGS) ×1 IMPLANT
DRSG AQUACEL AG SP 3.5X10 (GAUZE/BANDAGES/DRESSINGS) ×1 IMPLANT
DURAPREP 26ML APPLICATOR (WOUND CARE) ×1 IMPLANT
ELECT REM PT RETURN 15FT ADLT (MISCELLANEOUS) ×1 IMPLANT
GLOVE BIO SURGEON STRL SZ 6 (GLOVE) ×1 IMPLANT
GLOVE BIOGEL PI IND STRL 6.5 (GLOVE) ×1 IMPLANT
GLOVE BIOGEL PI IND STRL 7.5 (GLOVE) ×1 IMPLANT
GLOVE ORTHO TXT STRL SZ7.5 (GLOVE) ×2 IMPLANT
GOWN STRL REUS W/ TWL LRG LVL3 (GOWN DISPOSABLE) ×2 IMPLANT
HEAD FEM STD 32X+5 STRL (Hips) IMPLANT
HOLDER FOLEY CATH W/STRAP (MISCELLANEOUS) ×1 IMPLANT
KIT TURNOVER KIT A (KITS) IMPLANT
LINER ACET PNNCL PLUS4 NEUTRAL (Hips) IMPLANT
MANIFOLD NEPTUNE II (INSTRUMENTS) ×1 IMPLANT
NDL SAFETY ECLIPSE 18X1.5 (NEEDLE) IMPLANT
PACK ANTERIOR HIP CUSTOM (KITS) ×1 IMPLANT
PINNACLE PLUS 4 NEUTRAL (Hips) ×1 IMPLANT
PINNACLE SECTOR CUP 50MM (Hips) ×1 IMPLANT
SCREW 6.5MMX30MM (Screw) IMPLANT
STEM FEMORAL SZ5 HIGH ACTIS (Stem) IMPLANT
SUT MNCRL AB 4-0 PS2 18 (SUTURE) ×1 IMPLANT
SUT STRATAFIX 0 PDS 27 VIOLET (SUTURE) ×1 IMPLANT
SUT VIC AB 1 CT1 36 (SUTURE) ×3 IMPLANT
SUT VIC AB 2-0 CT1 TAPERPNT 27 (SUTURE) ×2 IMPLANT
SUTURE STRATFX 0 PDS 27 VIOLET (SUTURE) ×1 IMPLANT
SYR 3ML LL SCALE MARK (SYRINGE) IMPLANT
TOWEL GREEN STERILE FF (TOWEL DISPOSABLE) ×1 IMPLANT
TRAY FOLEY MTR SLVR 16FR STAT (SET/KITS/TRAYS/PACK) ×1 IMPLANT
TUBE SUCTION HIGH CAP CLEAR NV (SUCTIONS) ×1 IMPLANT
WATER STERILE IRR 1000ML POUR (IV SOLUTION) ×1 IMPLANT

## 2024-02-13 NOTE — Op Note (Signed)
 NAME:  [name]                ACCOUNT NO.: [id number]      MEDICAL RECORD NO.: [id number]      FACILITY:  [facility]      PHYSICIAN:  Shelda Pal  DATE OF BIRTH:  1946-05-07     DATE OF PROCEDURE:  02/13/2024                                 OPERATIVE REPORT         PREOPERATIVE DIAGNOSIS: Right  hip osteoarthritis.      POSTOPERATIVE DIAGNOSIS:  Right hip osteoarthritis.      PROCEDURE:  Right total hip replacement through an anterior approach   utilizing DePuy THR system, component size 50 mm pinnacle cup, a size 32+4 neutral   Altrex liner, a size 5 Hi Actis stem with a 32+5 Articuleze metal head ball.      SURGEON:  Madlyn Frankel. Charlann Boxer, M.D.      ASSISTANT:  Rosalene Billings, PA-C     ANESTHESIA:  Spinal.      SPECIMENS:  None.      COMPLICATIONS:  None.      BLOOD LOSS:  300 cc     DRAINS:  None.      INDICATION OF THE PROCEDURE:  [name] is a 78 y.o. female who had   presented to office for evaluation of right hip pain.  Radiographs revealed   progressive degenerative changes with bone-on-bone   articulation of the  hip joint, including subchondral cystic changes and osteophytes.  The patient had painful limited range of   motion significantly affecting their overall quality of life and function.  The patient was failing to    respond to conservative measures including medications and/or injections and activity modification and at this point was ready   to proceed with more definitive measures.  Consent was obtained for   benefit of pain relief.  Specific risks of infection, DVT, component   failure, dislocation, neurovascular injury, and need for revision surgery were reviewed in the office.     PROCEDURE IN DETAIL:  The patient was brought to operative theater.   Once adequate anesthesia, preoperative antibiotics, 2 gm of Ancef, 1 gm of Tranexamic Acid, and 10 mg of Decadron were administered, the patient was positioned  supine on the [employer] table.  Once the patient was safely positioned with adequate padding of boney prominences we predraped out the hip, and used fluoroscopy to confirm orientation of the pelvis.      The right hip was then prepped and draped from proximal iliac crest to   mid thigh with a shower curtain technique.      Time-out was performed identifying the patient, planned procedure, and the appropriate extremity.     An incision was then made 2 cm lateral to the   anterior superior iliac spine extending over the orientation of the   tensor fascia lata muscle and sharp dissection was carried down to the   fascia of the muscle.      The fascia was then incised.  The muscle belly was identified and swept   laterally and retractor placed along the superior neck.  Following   cauterization of the circumflex vessels and removing some pericapsular   fat, a second cobra retractor was placed on the inferior neck.  A T-capsulotomy was made along the line of the   superior neck to the trochanteric fossa, then extended proximally and   distally.  Tag sutures were placed and the retractors were then placed   intracapsular.  We then identified the trochanteric fossa and   orientation of my neck cut and then made a neck osteotomy with the femur on traction.  The femoral   head was removed without difficulty or complication.  Traction was let   off and retractors were placed posterior and anterior around the   acetabulum.      The labrum and foveal tissue were debrided.  I began reaming with a 45 mm   reamer and reamed up to 49 mm reamer with good bony bed preparation and a 50 mm  cup was chosen.  The final 50 mm Pinnacle cup was then impacted under fluoroscopy to confirm the depth of penetration and orientation with respect to   Abduction and forward flexion.  A screw was placed into the ilium followed by the hole eliminator.  The final   32+4 neutral Altrex liner was impacted with good visualized  rim fit.  The cup was positioned anatomically within the acetabular portion of the pelvis.      At this point, the femur was rolled to 100 degrees.  Further capsule was   released off the inferior aspect of the femoral neck.  I then   released the superior capsule proximally.  With the leg in a neutral position the hook was placed laterally   along the femur under the vastus lateralis origin and elevated manually and then held in position using the hook attachment on the bed.  The leg was then extended and adducted with the leg rolled to 100   degrees of external rotation.  Retractors were placed along the medial calcar and posteriorly over the greater trochanter.  Once the proximal femur was fully   exposed, I used a box osteotome to set orientation.  I then began   broaching with the starting chili pepper broach and passed this by hand and then broached up to 5.  With the 5 broach in place I chose a high offset neck and did several trial reductions.  The offset was appropriate, leg lengths   appeared to be equal best matched with the +5 head ball trial confirmed radiographically.   Given these findings, I went ahead and dislocated the hip, repositioned all   retractors and positioned the right hip in the extended and abducted position.  The final 5 Hi Actis stem was   chosen and it was impacted down to the level of neck cut.  Based on this   and the trial reductions, a final 32+5 Articuleze metal head ball was chosen and   impacted onto a clean and dry trunnion, and the hip was reduced.  The   hip had been irrigated throughout the case again at this point.  I did   reapproximate the superior capsular leaflet to the anterior leaflet   using #1 Vicryl.  The fascia of the   tensor fascia lata muscle was then reapproximated using #1 Vicryl and #0 Stratafix sutures.  The   remaining wound was closed with 2-0 Vicryl and running 4-0 Monocryl.   The hip was cleaned, dried, and dressed sterilely using  Dermabond and   Aquacel dressing.  The patient was then brought   to recovery room in stable condition tolerating the procedure well.    Rosalene Billings,  PA-C was present for the entirety of the case involved from   preoperative positioning, perioperative retractor management, general   facilitation of the case, as well as primary wound closure as assistant.            Madlyn Frankel Charlann Boxer, M.D.        02/13/2024 10:02 AM

## 2024-02-13 NOTE — Anesthesia Postprocedure Evaluation (Signed)
 Anesthesia Post Note  Patient: [name]  Procedure(s) Performed: ARTHROPLASTY, HIP, TOTAL, ANTERIOR APPROACH (Right: Hip)     Anesthesia Type: MAC Anesthetic complications: no   No notable events documented.  Last Vitals:  Vitals:   02/13/24 0915 02/13/24 1305  BP: (!) 145/81 124/70  Pulse: (!) 47 77  Resp: 16 17  Temp: 36.5 C (!) 36.3 C  SpO2: 95% 100%    Last Pain:  Vitals:   02/13/24 0945  TempSrc:   PainSc: 6                  Chadrick Sprinkle

## 2024-02-13 NOTE — Anesthesia Postprocedure Evaluation (Signed)
 Anesthesia Post Note  Patient: [name]  Procedure(s) Performed: ARTHROPLASTY, HIP, TOTAL, ANTERIOR APPROACH (Right: Hip)     Patient location during evaluation: PACU Anesthesia Type: MAC and Spinal Level of consciousness: awake Pain management: pain level controlled Vital Signs Assessment: post-procedure vital signs reviewed and stable Respiratory status: spontaneous breathing, respiratory function stable and nonlabored ventilation Cardiovascular status: blood pressure returned to baseline and stable Postop Assessment: no headache, no backache, no apparent nausea or vomiting and spinal receding Anesthetic complications: no   No notable events documented.  Last Vitals:  Vitals:   02/13/24 1445 02/13/24 1500  BP: 137/79 (!) 148/85  Pulse: (!) 55 (!) 46  Resp: 15 17  Temp:    SpO2: 100% 98%    Last Pain:  Vitals:   02/13/24 1500  TempSrc:   PainSc: 0-No pain    LLE Motor Response: Purposeful movement;Responds to commands (02/13/24 1457) LLE Sensation: Decreased;Numbness (02/13/24 1457) RLE Motor Response: Purposeful movement;Responds to commands (02/13/24 1457) RLE Sensation: Decreased;Numbness;Tingling (02/13/24 1457) L Sensory Level: L3-Anterior knee, lower leg (02/13/24 1457) R Sensory Level: L3-Anterior knee, lower leg (02/13/24 1457)  Linton Rump

## 2024-02-13 NOTE — Anesthesia Procedure Notes (Signed)
 Spinal  Patient location during procedure: OR Start time: 02/13/2024 11:46 AM End time: 02/13/2024 11:48 AM Reason for block: surgical anesthesia Staffing Performed: anesthesiologist  Anesthesiologist: Linton Rump, MD Performed by: Linton Rump, MD Authorized by: Linton Rump, MD   Preanesthetic Checklist Completed: patient identified, IV checked, site marked, risks and benefits discussed, surgical consent, monitors and equipment checked, pre-op evaluation and timeout performed Spinal Block Patient position: sitting Prep: DuraPrep Patient monitoring: blood pressure and continuous pulse ox Approach: midline Location: L3-4 Injection technique: single-shot Needle Needle type: Pencan  Needle gauge: 24 G Needle length: 9 cm Additional Notes Risks and benefits of neuraxial anesthesia including, but not limited to, infection, bleeding, local anesthetic toxicity, headache, hypotension, back pain, block failure, etc. were discussed with the patient. The patient expressed understanding and consented to the procedure. I confirmed that the patient has no bleeding disorders and is not taking blood thinners. I confirmed the patient's last platelet count with the nurse. Monitors were applied. A time-out was performed immediately prior to the procedure. Sterile technique was used throughout the whole procedure.   1 attempt(s)

## 2024-02-13 NOTE — H&P (Addendum)
 TOTAL HIP ADMISSION H&P  Patient is admitted for right total hip arthroplasty.  Therapy Plans: HEP Disposition: Home with husband Planned DVT Prophylaxis: Xarelto 10mg  daily DME needed: none PCP: Dr. Jacqulyn Bath - clearance received Cardiac: Dr. Antoine Poche - clearance received TXA: IV Allergies: amoxicillin - rash, dilaudid - nausea, ditropan - hallucinations Anesthesia Concerns: nausea/vomiting BMI: 23.6 Last HgbA1c: Not diabetic   Other: - Hx of PE after TKA - not on regular anticoagulation now - SDD** - oxycodone (prefers minimal), robaxin, tylenol, prefers using PRN advil with pepcid - patient is retired [employer] fund[employer]  POST OP MEDS SENT AHEAD  Subjective:  Chief Complaint: right hip pain  HPI: [name], 78 y.o. female, has a history of pain and functional disability in the right hip(s) due to arthritis and patient has failed non-surgical conservative treatments for greater than 12 weeks to include NSAID's and/or analgesics and activity modification.  Onset of symptoms was gradual starting 2 years ago with gradually worsening course since that time.The patient noted no past surgery on the right hip(s).  Patient currently rates pain in the right hip at 8 out of 10 with activity. Patient has worsening of pain with activity and weight bearing and pain that interfers with activities of daily living. Patient has evidence of joint space narrowing by imaging studies. This condition presents safety issues increasing the risk of falls.  There is no current active infection.  Patient Active Problem List   Diagnosis Date Noted   Closed fracture of right distal radius 07/12/2022   Calculus of kidney 12/09/2021   Osteoporosis 12/07/2021   Nonrheumatic mitral valve regurgitation 04/07/2020   Palpitations 04/07/2020   Educated about COVID-19 virus infection 04/07/2020   Pain in right knee 02/20/2020   Kidney stone 08/12/2019   PE (pulmonary thromboembolism) (HCC) 10/03/2018   Macrocytic  anemia 10/03/2018   Unilateral primary osteoarthritis, right knee 08/28/2018   History of total right knee replacement 08/28/2018   Bilateral primary osteoarthritis of knee 03/12/2018   Essential hypertension 08/26/2016   Mitral valve disorder 08/26/2016   History of palpitations 08/26/2016   Dyslipidemia 08/26/2016   Internal hemorrhoids 08/03/2015   Hx of radiation therapy    Breast cancer of upper-outer quadrant of right female breast (HCC) 05/23/2013   History of colonic polyps 01/06/2012   Irritable bowel syndrome - constipation predominant 01/06/2012   Past Medical History:  Diagnosis Date   Allergic rhinitis    Allergy    Arthritis    "knees" (08/28/2018)   Breast cancer, right breast (HCC) 05/20/2013   "no chemo" (08/28/2018)   Chronic lower back pain    Clotting disorder (HCC)    Diverticulosis    DOE (dyspnea on exertion)    Dysrhythmia    PVC   GERD (gastroesophageal reflux disease)    uses levsin once every 2-3 months   Graves' disease    2011   Heart murmur    slight   Heart palpitations    History of kidney stones    HLD (hyperlipidemia)    Hx of radiation therapy 08/13/13- 09/06/13   right breast 4250 cGy 17 sessions   Hypertension    controlled   IBS (irritable bowel syndrome)    Lumbar herniated disc    L4-L5   Major depression    MVP (mitral valve prolapse)    Osteopenia    Osteoporosis    zometa infusion 11/2021- is yearly infusion   Personal history of radiation therapy    Pneumonia 08/2019  PONV (postoperative nausea and vomiting)    "when I have general anesthetic" (08/28/2018)   Pulmonary emboli (HCC)    after knee replacement   PVC (premature ventricular contraction)    Takes metoprolol PRN for  pvc's   Tubular adenoma of colon 03/07/2012   Uterine hyperplasia    vaginal [place] every 6 months and had 2 biopsies so far with chnages noted    Past Surgical History:  Procedure Laterality Date   BREAST BIOPSY Right 2014   BREAST EXCISIONAL  BIOPSY Right    BREAST LUMPECTOMY Right 2014   BREAST LUMPECTOMY WITH NEEDLE LOCALIZATION AND AXILLARY SENTINEL LYMPH NODE BX Right 06/11/2013   Procedure: BREAST LUMPECTOMY WITH NEEDLE LOCALIZATION AND AXILLARY SENTINEL LYMPH NODE BX;  Surgeon: Shelly Rubenstein, MD;  Location: [facility];  Service: General;  Laterality: Right;  Needle localization BCG 7:30   COLONOSCOPY     COLONOSCOPY W/ BIOPSIES AND POLYPECTOMY  multiple   CYSTOSCOPY WITH STENT PLACEMENT Left 08/15/2019   Procedure: CYSTOSCOPY, left retrograde pyleogram  WITH STENT EXCHANGE left and foley placement;  Surgeon: Marcine Matar, MD;  Location: [facility];  Service: Urology;  Laterality: Left;   IR URETERAL STENT LEFT NEW ACCESS W/O SEP NEPHROSTOMY CATH  08/12/2019   IR URETERAL STENT LEFT NEW ACCESS W/O SEP NEPHROSTOMY CATH  12/09/2021   KNEE ARTHROSCOPY Left 2006   NEPHROLITHOTOMY Left 08/12/2019   Procedure: NEPHROLITHOTOMY PERCUTANEOUS/INSERTION DOUBLE J STENT;  Surgeon: Marcine Matar, MD;  Location: [facility];  Service: Urology;  Laterality: Left;  2 HRS   NEPHROLITHOTOMY Left 12/09/2021   Procedure: NEPHROLITHOTOMY PERCUTANEOUS;  Surgeon: Marcine Matar, MD;  Location: [facility];  Service: Urology;  Laterality: Left;  2 HRS   POLYPECTOMY     TOTAL KNEE ARTHROPLASTY Right 08/28/2018   Procedure: RIGHT TOTAL KNEE ARTHROPLASTY;  Surgeon: Valeria Batman, MD;  Location: [facility];  Service: Orthopedics;  Laterality: Right;   uterine biopsy     x 2 with no sedation    No current facility-administered medications for this encounter.   Current Outpatient Medications  Medication Sig Dispense Refill Last Dose/Taking   buPROPion (WELLBUTRIN XL) 300 MG 24 hr tablet 1 tablet in the morning Orally Once a day 30 tablet 0 Taking   Calcium Citrate-Vitamin D (CALCIUM CITRATE + D) 250-5 MG-MCG TABS Take 2 tablets by mouth daily at 12 noon.   Taking   diclofenac (VOLTAREN) 75 MG EC tablet Take 75 mg by mouth 2 (two) times daily.   Taking    ECHINACEA PO Take 1,300 mg by mouth daily as needed (immune support).   Taking As Needed   FIBER PO Take 1 tablet by mouth daily.   Taking   fluticasone (FLONASE) 50 MCG/ACT nasal spray Place 2 sprays into the nose daily.    Taking   ibuprofen (ADVIL) 200 MG tablet Take 200-400 mg by mouth every 8 (eight) hours as needed for moderate pain (pain score 4-6).   Taking As Needed   metoprolol tartrate (LOPRESSOR) 25 MG tablet Take 0.5 tablets (12.5 mg total) by mouth 2 (two) times daily. 90 tablet 3 Taking   polyethylene glycol (MIRALAX / GLYCOLAX) 17 g packet Take 17 g by mouth daily as needed for moderate constipation.   Taking As Needed   senna (SENOKOT) 8.6 MG tablet Take 1 tablet by mouth daily as needed for constipation.   Taking As Needed   SYNTHROID 75 MCG tablet TAKE ONE TABLET EVERY MORNING ON AN EMPTY STOMACH 30 tablet  3 Taking   zolpidem (AMBIEN) 10 MG tablet Take 2.5 mg by mouth at bedtime as needed for sleep.    Taking As Needed   atorvastatin (LIPITOR) 10 MG tablet Take one tablet by mouth daily with 20 mg tablet for total dose of 30 mg 90 tablet 2    atorvastatin (LIPITOR) 20 MG tablet TAKE ONE TABLET BY MOUTH DAILY WITH 10MG  TABLET FOR TOTAL DOSE OF 30MG . 90 tablet 2    COVID-19 mRNA vaccine, Pfizer, (COMIRNATY) syringe Inject into the muscle. (Patient not taking: Reported on 01/31/2024) 0.3 mL 0 Not Taking   hyoscyamine (LEVSIN SL) 0.125 MG SL tablet Take 1 tablet by mouth every 6-8 hours as needed for abdominal pain (Patient taking differently: Take 0.125 mg by mouth every 6 (six) hours as needed (heartburn).) 60 tablet 2    Zoster Vaccine Adjuvanted [facility]) injection Inject 0.5 mLs into the muscle. (Patient not taking: Reported on 01/31/2024) 0.5 mL 0 Not Taking   Allergies  Allergen Reactions   Dilaudid [Hydromorphone Hcl] Nausea And Vomiting   Oxybutynin Chloride     Other Reaction(s): hallucinations   Terak [Terramycin] Rash   Oxybutynin Other (See Comments)    Patient  reports hallucinations.    Amoxicillin Diarrhea and Rash    Has patient had a amoxicillin reaction causing immediate rash, facial/tongue/throat swelling, SOB or lightheadedness with hypotension: No  Has patient had a PCN reaction causing severe rash involving mucus membranes or skin necrosis: No  Has patient had a PCN reaction that required hospitalization: No  Has patient had a PCN reaction occurring within the last 10 years: Unknown  If all of the above answers are "NO", then may proceed with Cephalosporin use.   Erythromycin Rash   Oxytetracycline Rash    Social History   Tobacco Use   Smoking status: Never   Smokeless tobacco: Never  Substance Use Topics   Alcohol use: Not Currently    Comment: 08/28/2018 "1-2 drinks/month". occas.    Family History  Problem Relation Age of Onset   Heart disease Mother 23       MI   Lung cancer Mother    Hypertension Mother    Alcohol abuse Father    COPD Father    Breast cancer Maternal Aunt    Pancreatic cancer Maternal Aunt    Colon cancer Maternal Grandfather 54   Diabetes Paternal Grandmother    Stomach cancer Neg Hx    Colon polyps Neg Hx    Esophageal cancer Neg Hx    Rectal cancer Neg Hx      Review of Systems  Constitutional:  Negative for chills and fever.  Respiratory:  Negative for cough and shortness of breath.   Cardiovascular:  Negative for chest pain.  Gastrointestinal:  Negative for nausea and vomiting.  Musculoskeletal:  Positive for arthralgias.     Objective:  Physical Exam Well nourished and well developed. General: Alert and oriented x3, cooperative and pleasant, no acute distress.  Musculoskeletal: Right hip exam: She has noted to have pain with hip flexion internal rotation to 5 degrees with pelvic tilting due to pain, external rotation over 20 degrees Slight external rotation contracture with active hip flexion Some limitations noted with active hip flexion external rotation and  abduction Neurovascular intact distally  Left hip exam: More fluid range of motion of left hip without reproducible pain   Vital signs in last 24 hours:    Labs:   Estimated body mass index is 23.74 kg/m as  calculated from the following:   Height as of 02/02/24: 5\' 3"  (1.6 m).   Weight as of 02/02/24: 60.8 kg.   Imaging Review Plain radiographs demonstrate severe degenerative joint disease of the right hip(s). The bone quality appears to be adequate for age and reported activity level.      Assessment/Plan:  End stage arthritis, right hip(s)  The patient history, physical examination, clinical judgement of the provider and imaging studies are consistent with end stage degenerative joint disease of the right hip(s) and total hip arthroplasty is deemed medically necessary. The treatment options including medical management, injection therapy, arthroscopy and arthroplasty were discussed at length. The risks and benefits of total hip arthroplasty were presented and reviewed. The risks due to aseptic loosening, infection, stiffness, dislocation/subluxation,  thromboembolic complications and other imponderables were discussed.  The patient acknowledged the explanation, agreed to proceed with the plan and consent was signed. Patient is being admitted for inpatient treatment for surgery, pain control, PT, OT, prophylactic antibiotics, VTE prophylaxis, progressive ambulation and ADL's and discharge planning.The patient is planning to be discharged  home.   Rosalene Billings, PA-C Orthopedic Surgery [facility] [place] [phone]

## 2024-02-13 NOTE — Discharge Instructions (Signed)

## 2024-02-13 NOTE — Transfer of Care (Signed)
 Immediate Anesthesia Transfer of Care Note  Patient: [name]  Procedure(s) Performed: ARTHROPLASTY, HIP, TOTAL, ANTERIOR APPROACH (Right: Hip)  Patient Location: PACU  Anesthesia Type:MAC combined with regional for post-op pain  Level of Consciousness: awake and alert   Airway & Oxygen Therapy: Patient Spontanous Breathing and Patient connected to nasal cannula oxygen  Post-op Assessment: Report given to RN and Post -op Vital signs reviewed and stable  Post vital signs: Reviewed and stable  Last Vitals:  Vitals Value Taken Time  BP 124/70 02/13/24 1305  Temp 36.3 C 02/13/24 1305  Pulse 77 02/13/24 1310  Resp 22 02/13/24 1310  SpO2 99 % 02/13/24 1310  Vitals shown include unfiled device data.  Last Pain:  Vitals:   02/13/24 0945  TempSrc:   PainSc: 6          Complications: No notable events documented.

## 2024-02-13 NOTE — Interval H&P Note (Signed)
 History and Physical Interval Note:  02/13/2024 10:02 AM  [name]  has presented today for surgery, with the diagnosis of Right hip osteoarthritis.  The various methods of treatment have been discussed with the patient and family. After consideration of risks, benefits and other options for treatment, the patient has consented to  Procedure(s): ARTHROPLASTY, HIP, TOTAL, ANTERIOR APPROACH (Right) as a surgical intervention.  The patient's history has been reviewed, patient examined, no change in status, stable for surgery.  I have reviewed the patient's chart and labs.  Questions were answered to the patient's satisfaction.     Shelda Pal

## 2024-02-13 NOTE — Evaluation (Signed)
 Physical Therapy Evaluation Patient Details Name: [name] MRN: [id number] DOB: [birth year] Today's Date: 02/13/2024  History of Present Illness  Pt is 78 yo female s/p R anterior THA on 02/13/24.  Pt with hx including but not limited to OP, arthritis, HTN, breast CA, R TKA  Clinical Impression  Pt is s/p THA resulting in the deficits listed below (see PT Problem List). AT baseline pt independent, she has home support, can stay on entry level, and has RW.  PT asked to see for possible same day d/c.  Pt motivated and with good pain control.  She has good AROM but does still have some tingling in bil plantar feet.  Pt was able to transfers at supervision/CGA level and ambulated 80' and performed stairs similar to home set up safely.  She had good understanding of HEP and progression.  Pt demonstrates safe gait & transfers in order to return home from PT perspective once discharged by MD.  While in hospital, will continue to benefit from PT for skilled therapy to advance mobility and exercises.  Pt did try to urinate during session but was unable - notified RN.            If plan is discharge home, recommend the following: A little help with walking and/or transfers;A little help with bathing/dressing/bathroom;Assistance with cooking/housework;Help with stairs or ramp for entrance   Can travel by private vehicle        Equipment Recommendations Rolling walker (2 wheels)  Recommendations for Other Services       Functional Status Assessment Patient has had a recent decline in their functional status and demonstrates the ability to make significant improvements in function in a reasonable and predictable amount of time.     Precautions / Restrictions Precautions Precautions: Fall Restrictions Weight Bearing Restrictions Per Provider Order: Yes RLE Weight Bearing Per Provider Order: Weight bearing as tolerated      Mobility  Bed Mobility Overal bed mobility: Needs  Assistance Bed Mobility: Supine to Sit, Sit to Supine     Supine to sit: Supervision Sit to supine: Supervision        Transfers Overall transfer level: Needs assistance Equipment used: Rolling walker (2 wheels) Transfers: Sit to/from Stand Sit to Stand: Contact guard assist           General transfer comment: Stood from bed and toilet    Ambulation/Gait Ambulation/Gait assistance: Supervision, Contact guard assist Gait Distance (Feet): 80 Feet Assistive device: Rolling walker (2 wheels) Gait Pattern/deviations: Step-to pattern, Decreased stride length Gait velocity: decreased but functional     General Gait Details: Started CGA and progressed to supervision; pt does report feels like having to watch feet more b/c of decreased sensation ;  pt stable and tolerated well  Stairs Stairs: Yes Stairs assistance: Contact guard assist Stair Management: One rail Left, Step to pattern, Sideways Number of Stairs: 4 General stair comments: up/down 6" steps sideways without difficulty; educated on sequencing  Wheelchair Mobility     Tilt Bed    Modified Rankin (Stroke Patients Only)       Balance Overall balance assessment: Needs assistance Sitting-balance support: No upper extremity supported Sitting balance-Leahy Scale: Good     Standing balance support: Bilateral upper extremity supported, No upper extremity supported Standing balance-Leahy Scale: Fair Standing balance comment: RW to ambulate but could static stand without support  Pertinent Vitals/Pain Pain Assessment Pain Assessment: No/denies pain    Home Living Family/patient expects to be discharged to:: Private residence Living Arrangements: Spouse/significant other Available Help at Discharge: Family;Available 24 hours/day Type of Home: House Home Access: Stairs to enter Entrance Stairs-Rails: Left Entrance Stairs-Number of Steps: 2   Home Layout: Two  level;Able to live on main level with bedroom/bathroom Home Equipment: Shower seat;Rolling Walker (2 wheels);Cane - single point      Prior Function Prior Level of Function : Independent/Modified Independent;Driving                     Extremity/Trunk Assessment   Upper Extremity Assessment Upper Extremity Assessment: Overall WFL for tasks assessed    Lower Extremity Assessment Lower Extremity Assessment: LLE deficits/detail;RLE deficits/detail (Did report still with mild tingling plantar feet but all ROM intact and other numbness has resovled) RLE Deficits / Details: ROM WFL ; MMT ankle 5/5, knee and hip 3/5 not further tested LLE Deficits / Details: ROM WFL; MMT 5/5    Cervical / Trunk Assessment Cervical / Trunk Assessment: Normal  Communication        Cognition   Behavior During Therapy: WFL for tasks assessed/performed   PT - Cognitive impairments: No apparent impairments                                 Cueing       General Comments General comments (skin integrity, edema, etc.): VSS; pt unable to urinate - notified RN  Educated on safe ice use, no pivots, car transfers, and TED hose during day. Also, encouraged walking every 1-2 hours during day. Educated on HEP with focus on mobility the first weeks. Discussed doing exercises within pain control and if pain increasing could decreased ROM, reps, and stop exercises as needed. Encouraged to perform  ankle pumps frequently for blood flow .   Exercises Total Joint Exercises Ankle Circles/Pumps: AROM, Both, 10 reps, Supine Quad Sets: AROM, Right, 10 reps, Supine Heel Slides: AAROM, Right, 5 reps, Supine Hip ABduction/ADduction: AAROM, Right, 5 reps, Supine Long Arc Quad: AROM, Right, 5 reps, Seated Other Exercises Other Exercises: Educated on standing exercises R side AROM hip ext, hip abd, h-s curl, and marching   Assessment/Plan    PT Assessment Patient needs continued PT services  PT Problem  List Decreased strength;Pain;Decreased range of motion;Decreased activity tolerance;Decreased balance;Decreased mobility;Decreased knowledge of use of DME;Impaired sensation       PT Treatment Interventions DME instruction;Therapeutic exercise;Gait training;Balance training;Stair training;Functional mobility training;Therapeutic activities;Patient/family education;Modalities    PT Goals (Current goals can be found in the Care Plan section)  Acute Rehab PT Goals Patient Stated Goal: return home PT Goal Formulation: With patient/family Time For Goal Achievement: 02/27/24 Potential to Achieve Goals: Good    Frequency 7X/week     Co-evaluation               AM-PAC PT "6 Clicks" Mobility  Outcome Measure Help needed turning from your back to your side while in a flat bed without using bedrails?: A Little Help needed moving from lying on your back to sitting on the side of a flat bed without using bedrails?: A Little Help needed moving to and from a bed to a chair (including a wheelchair)?: A Little Help needed standing up from a chair using your arms (e.g., wheelchair or bedside chair)?: A Little Help needed to walk in hospital room?:  A Little Help needed climbing 3-5 steps with a railing? : A Little 6 Click Score: 18    End of Session Equipment Utilized During Treatment: Gait belt Activity Tolerance: Patient tolerated treatment well Patient left: in bed;with family/visitor present (in PACU) Nurse Communication: Mobility status PT Visit Diagnosis: Other abnormalities of gait and mobility (R26.89);Muscle weakness (generalized) (M62.81)    Time: 6578-4696 PT Time Calculation (min) (ACUTE ONLY): 28 min   Charges:   PT Evaluation $PT Eval Low Complexity: 1 Low PT Treatments $Gait Training: 8-22 mins PT General Charges $$ ACUTE PT VISIT: 1 Visit         Anise Salvo, PT Acute Rehab [facility] [phone]   Rayetta Humphrey 02/13/2024, 6:21 PM

## 2024-02-14 ENCOUNTER — Encounter (HOSPITAL_COMMUNITY): Payer: Self-pay | Admitting: Orthopedic Surgery

## 2024-03-12 ENCOUNTER — Emergency Department (HOSPITAL_COMMUNITY)
Admission: EM | Admit: 2024-03-12 | Discharge: 2024-03-12 | Disposition: A | Discharge status: Home / Self Care | Attending: Emergency Medicine | Admitting: Emergency Medicine

## 2024-03-12 ENCOUNTER — Emergency Department (HOSPITAL_COMMUNITY)

## 2024-03-12 ENCOUNTER — Other Ambulatory Visit: Payer: Self-pay

## 2024-03-12 DIAGNOSIS — R079 Chest pain, unspecified: Secondary | ICD-10-CM | POA: Diagnosis present

## 2024-03-12 DIAGNOSIS — Z7901 Long term (current) use of anticoagulants: Secondary | ICD-10-CM | POA: Diagnosis not present

## 2024-03-12 DIAGNOSIS — I10 Essential (primary) hypertension: Secondary | ICD-10-CM | POA: Diagnosis not present

## 2024-03-12 DIAGNOSIS — Z79899 Other long term (current) drug therapy: Secondary | ICD-10-CM | POA: Diagnosis not present

## 2024-03-12 LAB — CBC
HCT: 38.5 % (ref 36.0–46.0)
Hemoglobin: 12.2 g/dL (ref 12.0–15.0)
MCH: 33.1 pg (ref 26.0–34.0)
MCHC: 31.7 g/dL (ref 30.0–36.0)
MCV: 104.3 fL — ABNORMAL HIGH (ref 80.0–100.0)
Platelets: 214 10*3/uL (ref 150–400)
RBC: 3.69 MIL/uL — ABNORMAL LOW (ref 3.87–5.11)
RDW: 13.5 % (ref 11.5–15.5)
WBC: 4.3 10*3/uL (ref 4.0–10.5)
nRBC: 0 % (ref 0.0–0.2)

## 2024-03-12 LAB — BASIC METABOLIC PANEL WITH GFR
Anion gap: 9 (ref 5–15)
BUN: 12 mg/dL (ref 8–23)
CO2: 23 mmol/L (ref 22–32)
Calcium: 8.7 mg/dL — ABNORMAL LOW (ref 8.9–10.3)
Chloride: 108 mmol/L (ref 98–111)
Creatinine, Ser: 0.83 mg/dL (ref 0.44–1.00)
GFR, Estimated: >60 mL/min (ref >60)
Glucose, Bld: 85 mg/dL (ref 70–99)
Potassium: 4.1 mmol/L (ref 3.5–5.1)
Sodium: 140 mmol/L (ref 135–145)

## 2024-03-12 LAB — TROPONIN I (HIGH SENSITIVITY)
Troponin I (High Sensitivity): 7 ng/L (ref <18)
Troponin I (High Sensitivity): 7 ng/L (ref <18)

## 2024-03-12 NOTE — ED Provider Triage Note (Signed)
 Emergency Medicine Provider Triage Evaluation Note  [name] , a 78 y.o. female  was evaluated in triage.  Pt complains of dull chest pain that has been on and off for about the past week.  Comes on and off random.  Pain is nonexertional, nonpleuritic.  States this does not feel like her previous PE, and she has been on Xarelto .  No shortness of breath, nausea or vomiting.  Review of Systems  Positive: As above Negative: As above  Physical Exam  BP (!) 177/83 (BP Location: Right Arm)   Pulse (!) 51   Temp 97.8 F (36.6 C)   Resp 16   Ht 5' 3.5" (1.613 m)   Wt 59 kg   SpO2 100%   BMI 22.67 kg/m  Gen:   Awake, no distress   Resp:  Normal effort  MSK:   Moves extremities without difficulty    Medical Decision Making  Medically screening exam initiated at 12:22 PM.  Appropriate orders placed.  [name] was informed that the remainder of the evaluation will be completed by another provider, this initial triage assessment does not replace that evaluation, and the importance of remaining in the ED until their evaluation is complete.     Michelle Catena, PA-C 03/12/24 1224

## 2024-03-12 NOTE — ED Provider Notes (Signed)
 [place] [facility] AT [facility] Provider Note   CSN: [id number] Arrival date & time: 03/12/24  1022     History  Chief Complaint  Patient presents with   Chest Pain   Neck Pain   Nausea    [name] is a 78 y.o. female.  Patient is a 78 year old female with a history of hyperlipidemia, PE after a knee replacement, PVCs on metoprolol , recent hip replacement approximately 1 month ago who has been on Xarelto  since the surgery who is presenting today with a discomfort in her upper chest that started earlier today.  It lasted maybe an hour or less and resolved spontaneously after she got to the emergency room.  She reports it started at rest when she was getting ready to go somewhere this morning.  She denied any shortness of breath, radiation into her back, nausea or vomiting.  Symptoms of discomfort did not go down into her arms or legs.  She denied any unilateral numbness or tingling.  She reports that prior to this she has had some occasional episodes of something feeling unsettling and it starts at the back of her neck and wraps around to her jaws and has been going on intermittently.  The last episode she had was on Sunday while she was just sitting in church but reports these episodes do not last long and resolved spontaneously.  Prior to that it was a week before.  She had followed up at her cardiologist office and she has an appointment to see Dr. Lavonne Prairie at the end of May because she is concerned about these episodes.  She has no syncope or near syncope.  It is not affected by eating or certain movements.  She currently reports being symptom-free at this time.  She has not had any unilateral leg pain or swelling is been compliant with his Xarelto .  She checks her blood pressure at home regularly and reports that it has been in the 120s to 130s over 80s.  The history is provided by the patient and medical records.  Chest Pain Neck Pain Associated  symptoms: chest pain        Home Medications Prior to Admission medications   Medication Sig Start Date End Date Taking? Authorizing Provider  atorvastatin  (LIPITOR) 10 MG tablet Take one tablet by mouth daily with 20 mg tablet for total dose of 30 mg 02/05/24   Eilleen Grates, MD  atorvastatin  (LIPITOR) 20 MG tablet TAKE ONE TABLET BY MOUTH DAILY WITH 10MG  TABLET FOR TOTAL DOSE OF 30MG . 02/05/24   Eilleen Grates, MD  buPROPion  (WELLBUTRIN  XL) 300 MG 24 hr tablet 1 tablet in the morning Orally Once a day 05/05/21   Towanda Fret, MD  Calcium  Citrate-Vitamin D  (CALCIUM  CITRATE + D) 250-5 MG-MCG TABS Take 2 tablets by mouth daily at 12 noon. 12/11/23   Cameron Cea, MD  chlorhexidine  (HIBICLENS ) 4 % external liquid Apply 15 mLs (1 Application total) topically as directed for 30 doses. Use as directed daily for 5 days every other week for 6 weeks. 02/13/24   Earnie Gola, PA-C  COVID-19 mRNA vaccine, Pfizer, (COMIRNATY ) syringe Inject into the muscle. Patient not taking: Reported on 01/31/2024 11/24/23   Liane Redman, MD  ECHINACEA PO Take 1,300 mg by mouth daily as needed (immune support).    [provider]  FIBER PO Take 1 tablet by mouth daily.    [provider]  fluticasone  (FLONASE ) 50 MCG/ACT nasal spray Place 2 sprays  into the nose daily.     [provider]  hyoscyamine  (LEVSIN SL) 0.125 MG SL tablet Take 1 tablet by mouth every 6-8 hours as needed for abdominal pain Patient taking differently: Take 0.125 mg by mouth every 6 (six) hours as needed (heartburn). 08/27/20   Kennedy-Smith, Colleen M, NP  ibuprofen (ADVIL) 200 MG tablet Take 200-400 mg by mouth every 8 (eight) hours as needed for moderate pain (pain score 4-6).    [provider]  metoprolol  tartrate (LOPRESSOR ) 25 MG tablet Take 0.5 tablets (12.5 mg total) by mouth 2 (two) times daily. 08/02/22   Eilleen Grates, MD  mupirocin  ointment (BACTROBAN ) 2 % Place 1 Application into the  nose 2 (two) times daily for 60 doses. Use as directed 2 times daily for 5 days every other week for 6 weeks. 02/13/24 03/14/24  Earnie Gola, PA-C  polyethylene glycol (MIRALAX  / GLYCOLAX ) 17 g packet Take 17 g by mouth daily as needed for moderate constipation.    [provider]  senna (SENOKOT) 8.6 MG tablet Take 1 tablet by mouth daily as needed for constipation.    [provider]  SYNTHROID  75 MCG tablet TAKE ONE TABLET EVERY MORNING ON AN EMPTY STOMACH 09/01/21   Towanda Fret, MD  zolpidem  (AMBIEN ) 10 MG tablet Take 2.5 mg by mouth at bedtime as needed for sleep.  03/22/19   [provider]  Zoster Vaccine Adjuvanted (SHINGRIX ) injection Inject 0.5 mLs into the muscle. Patient not taking: Reported on 01/31/2024 03/09/23         Allergies    Dilaudid  [hydromorphone  hcl], Oxybutynin  chloride, Terak [terramycin], Oxybutynin , Amoxicillin, Erythromycin, and Oxytetracycline    Review of Systems   Review of Systems  Cardiovascular:  Positive for chest pain.  Musculoskeletal:  Positive for neck pain.    Physical Exam Updated Vital Signs BP (!) 164/73   Pulse (!) 42   Temp 97.7 F (36.5 C)   Resp 15   Ht 5' 3.5" (1.613 m)   Wt 59 kg   SpO2 100%   BMI 22.67 kg/m  Physical Exam Vitals and nursing note reviewed.  Constitutional:      General: She is not in acute distress.    Appearance: She is well-developed.  HENT:     Head: Normocephalic and atraumatic.  Eyes:     Pupils: Pupils are equal, round, and reactive to light.  Neck:     Vascular: No carotid bruit.  Cardiovascular:     Rate and Rhythm: Normal rate and regular rhythm.     Pulses: Normal pulses.     Heart sounds: Normal heart sounds. No murmur heard.    No friction rub.  Pulmonary:     Effort: Pulmonary effort is normal.     Breath sounds: Normal breath sounds. No wheezing or rales.  Chest:     Chest wall: No tenderness.  Abdominal:     General: Bowel sounds are normal. There  is no distension.     Palpations: Abdomen is soft.     Tenderness: There is no abdominal tenderness. There is no guarding or rebound.  Musculoskeletal:        General: No tenderness. Normal range of motion.     Cervical back: Neck supple.     Right lower leg: No edema.     Left lower leg: No edema.     Comments: No edema  Skin:    General: Skin is warm and dry.  Findings: No rash.  Neurological:     Mental Status: She is alert and oriented to person, place, and time.     Cranial Nerves: No cranial nerve deficit.  Psychiatric:        Behavior: Behavior normal.     ED Results / Procedures / Treatments   Labs (all labs ordered are listed, but only abnormal results are displayed) Labs Reviewed  BASIC METABOLIC PANEL WITH GFR - Abnormal; Notable for the following components:      Result Value   Calcium  8.7 (*)    All other components within normal limits  CBC - Abnormal; Notable for the following components:   RBC 3.69 (*)    MCV 104.3 (*)    All other components within normal limits  TROPONIN I (HIGH SENSITIVITY)  TROPONIN I (HIGH SENSITIVITY)    EKG EKG Interpretation Date/Time:  Tuesday March 12 2024 10:42:56 EDT Ventricular Rate:  43 PR Interval:  168 QRS Duration:  84 QT Interval:  464 QTC Calculation: 392 R Axis:   36  Text Interpretation: Marked sinus bradycardia Abnormal ECG When compared with ECG of 21-Dec-2023 08:59, PREVIOUS ECG IS PRESENT Confirmed by Elise Guile [phone]) on 03/12/2024 10:51:17 AM  Radiology DG Chest 2 View Result Date: 03/12/2024 CLINICAL DATA:  Chest pain EXAM: CHEST - 2 VIEW COMPARISON:  None Available. FINDINGS: The heart size and mediastinal contours are within normal limits. Both lungs are clear. The visualized skeletal structures are unremarkable. Postsurgical changes within the right chest wall could correlate with partial mastectomy IMPRESSION: No active cardiopulmonary disease. Electronically Signed   By: Fredrich Jefferson M.D.   On:  03/12/2024 12:40    Procedures Procedures    Medications Ordered in ED Medications - No data to display  ED Course/ Medical Decision Making/ A&P                                 Medical Decision Making Amount and/or Complexity of Data Reviewed External Data Reviewed: notes. Labs: ordered. Decision-making details documented in ED Course. Radiology: ordered and independent interpretation performed. Decision-making details documented in ED Course. ECG/medicine tests: ordered and independent interpretation performed. Decision-making details documented in ED Course.   Pt with multiple medical problems and comorbidities and presenting today with a complaint that caries a high risk for morbidity and mortality.  Here today with nonspecific chest pain.  Patient symptoms started at rest and resolved after arrival here.  It is now been greater than 3 hours since symptom onset.  Patient was a bit hypertensive when she got here which is worsened since being here but she reports not liking it here and wanting to go home.  She has been anticoagulated since her hip surgery and low suspicion for PE at this time.  Symptoms are not classic for dissection.  After discussing with patient that she would be going home blood pressure improved spontaneously.  She will continue watching it at home.  She has no reproducible symptoms on exam or carotid bruits and at this time I independently interpreted her EKG and lab work.  EKG shows a sinus bradycardia but this is unchanged from prior EKGs.  Troponin x 2, BMP and CBC all without acute findings.  I have independently visualized and interpreted pt's images today.  Chest x-ray within normal limits.  Will have patient follow-up with her cardiologist but at this time low suspicion for PE, tamponade, MI, dissection, acute lung  pathology or abdominal pathology.  Patient appears stable for discharge.  She is comfortable with this plan.         Final Clinical  Impression(s) / ED Diagnoses Final diagnoses:  Nonspecific chest pain  Hypertension, unspecified type    Rx / DC Orders ED Discharge Orders          Ordered    Ambulatory referral to Cardiology       Comments: Has appt in late may but here today with episode of chest pain.  Trop neg.  Sees Dr. Lavonne Prairie   03/12/24 1615              Almond Army, MD 03/12/24 [phone]

## 2024-03-12 NOTE — Discharge Instructions (Signed)
 All the blood work and EKG today are ok.  X-ray was normal.  Your blood pressure is high so make sure you follow that at home and if it continues to be elevated follow up with your doctor.  A referral was made to Dr. Lavonne Prairie so hopefully they will call you this week for sooner appointment.

## 2024-03-12 NOTE — ED Triage Notes (Signed)
 Pt. Stated, I had a hip replacement one month ago and I started having some dull chest pain but no SOB. Im also having a little neck pain . The chest pain started a week ago.

## 2024-03-17 DIAGNOSIS — I2510 Atherosclerotic heart disease of native coronary artery without angina pectoris: Secondary | ICD-10-CM | POA: Insufficient documentation

## 2024-03-17 NOTE — Progress Notes (Unsigned)
 Cardiology Office Note:   Date:  03/18/2024  ID:  [name], DOB Jul 12, 1946, MRN [id number] PCP: Elester Grim, MD  [facility] Providers Cardiologist:  Eilleen Grates, MD {  History of Present Illness:   [name] is a 78 y.o. female who I saw previously for evaluation of palpitations she had diagonal stenosis when noted on a CT that was done in [place] when she was living there.  She had a normal echo . I saw her many years ago and she was having palpitations. She does have mitral valve prolapse. However, nothing significant was noted and she didn't have any problems until she developed Graves' disease 2011. She again had palpitations. She was living in [place] and she had a workup by a cardiologist there. Coronary CT demonstrated some diagonal stenosis but she doesn't know of any other disease. She had a treadmill test which apparently was negative.   She had a normal echo in 2019.  She had a POET (Plain Old Exercise Treadmill) which was negative in 2020.  She had a PE after knee replacement.     Since I last saw her she was in the ED the other day with chest pain.  I reviewed these records for this visit.    She said that she has been getting chest pain off  and on .  This is happening at rest.  It is a discomfort in her mid chest.  It might go up to her jaw.  It seems to go around the back of her head.  It might be there all day.  She causes her discomfort.  It can be a 5 out of 10 in intensity.  Is not with activities and she can do some walking and some stairs without bringing this on.  She is not having any nausea vomiting or diaphoresis.  There is no associated shortness of breath.  She had hip surgery several weeks ago and has gone well.  In the emergency room she was not found to have any enzyme elevation.  EKG was unremarkable.  She does have bradycardia.  She does not have any presyncope or syncope.  She does not feel any palpitations.   ROS: As stated in  the HPI and negative for all other systems.  Studies Reviewed:    EKG:     Sinus bradycardia, rate 43, axis within normal limits, intervals within normal limits, no acute ST-T wave changes.   Risk Assessment/Calculations:      Physical Exam:   VS:  BP (!) 141/72 (BP Location: Left Arm, Patient Position: Sitting)   Pulse (!) 53   Ht 5\' 5"  (1.651 m)   Wt 132 lb 3.2 oz (60 kg)   SpO2 99%   BMI 22.00 kg/m    Wt Readings from Last 3 Encounters:  03/18/24 132 lb 3.2 oz (60 kg)  03/12/24 130 lb (59 kg)  02/13/24 134 lb (60.8 kg)     GEN: Well nourished, well developed in no acute distress NECK: No JVD; No carotid bruits CARDIAC: RRR, late systolic murmur, no diastolic murmurs, rubs, gallops RESPIRATORY:  Clear to auscultation without rales, wheezing or rhonchi  ABDOMEN: Soft, non-tender, non-distended EXTREMITIES:  No edema; No deformity   ASSESSMENT AND PLAN:   CAD The patient's had some chest discomfort that sounds nonanginal greater than anginal.  However, given this and her previous history of some coronary disease on a CT in 2011 in [place] I am going to repeat a  coronary CTA.   Mitral valve disorder This was mild in Nov 2022.  She is due for follow-up echocardiography and I will order this.   Dyslipidemia  I have previously increased her Lipitor to 40 mg daily but she did not tolerate this.  She is tolerating 30 mg.  Her LDL was 77 with an HDL of 69.  Continue meds as listed.     Follow up with me in 1 year or sooner based on the results of the above  Signed, Eilleen Grates, MD

## 2024-03-18 ENCOUNTER — Encounter: Payer: Self-pay | Admitting: Cardiology

## 2024-03-18 ENCOUNTER — Other Ambulatory Visit: Payer: Self-pay | Admitting: Internal Medicine

## 2024-03-18 ENCOUNTER — Ambulatory Visit: Attending: Cardiology | Admitting: Cardiology

## 2024-03-18 VITALS — BP 141/72 | HR 53 | Ht 65.0 in | Wt 132.2 lb

## 2024-03-18 DIAGNOSIS — E785 Hyperlipidemia, unspecified: Secondary | ICD-10-CM | POA: Diagnosis not present

## 2024-03-18 DIAGNOSIS — I059 Rheumatic mitral valve disease, unspecified: Secondary | ICD-10-CM

## 2024-03-18 DIAGNOSIS — R079 Chest pain, unspecified: Secondary | ICD-10-CM

## 2024-03-18 DIAGNOSIS — M8588 Other specified disorders of bone density and structure, other site: Secondary | ICD-10-CM

## 2024-03-18 DIAGNOSIS — I2510 Atherosclerotic heart disease of native coronary artery without angina pectoris: Secondary | ICD-10-CM

## 2024-03-18 NOTE — Patient Instructions (Addendum)
 Medication Instructions:  Your physician recommends that you continue on your current medications as directed. Please refer to the Current Medication list given to you today.  *If you need a refill on your cardiac medications before your next appointment, please call your pharmacy*  Lab Work: CBC, CMET If you have labs (blood work) drawn today and your tests are completely normal, you will receive your results only by: MyChart Message (if you have MyChart) OR A paper copy in the mail If you have any lab test that is abnormal or we need to change your treatment, we will call you to review the results.  Testing/Procedures: Echo Your physician has requested that you have an echocardiogram. Echocardiography is a painless test that uses sound waves to create images of your heart. It provides your doctor with information about the size and shape of your heart and how well your heart's chambers and valves are working. This procedure takes approximately one hour. There are no restrictions for this procedure. Please do NOT wear cologne, perfume, aftershave, or lotions (deodorant is allowed). Please arrive 15 minutes prior to your appointment time.  Please note: We ask at that you not bring children with you during ultrasound (echo/ vascular) testing. Due to room size and safety concerns, children are not allowed in the ultrasound rooms during exams. Our front office staff cannot provide observation of children in our lobby area while testing is being conducted. An adult accompanying a patient to their appointment will only be allowed in the ultrasound room at the discretion of the ultrasound technician under special circumstances. We apologize for any inconvenience.  Coronary CTA Your physician has requested that you have cardiac CT. Cardiac computed tomography (CT) is a painless test that uses an x-ray machine to take clear, detailed pictures of your heart. For further information please visit  [email or web address] Please follow instruction sheet as given.   Follow-Up: At [facility], you and your health needs are our priority.  As part of our continuing mission to provide you with exceptional heart care, our providers are all part of one team.  This team includes your primary Cardiologist (physician) and Advanced Practice Providers or APPs (Physician Assistants and Nurse Practitioners) who all work together to provide you with the care you need, when you need it.  Your next appointment:   1 year(s) pending test results.   Provider:   Eilleen Grates, MD      Your cardiac CT will be scheduled at one of the below locations:   Jeralene Mom. [facility] [address]  [place], [place] [address] Opening March 18, 2024  If scheduled at [facility], please arrive at the [facility] and [facility] Entrance (Entrance C2) of [facility] 30 minutes prior to test start time. You can use the FREE valet parking offered at entrance C (encouraged to control the heart rate for the test)  Proceed to the [facility] Radiology Department (first floor) to check-in and test prep.   All radiology patients and guests should use entrance C2 at [facility], accessed from [facility], even though the hospital's physical address listed is [address]    If scheduled at the Heart and Vascular Tower at [address], please enter the parking lot using the [address] entrance and use the FREE valet service at the patient drop-off area. Enter the buidling and check-in with registration on the main floor.  Please follow these instructions carefully (unless otherwise directed):  An IV will be required for this test and Nitroglycerin will be given.  Hold all erectile dysfunction medications at least 3 days (72 hrs) prior to test. (Ie viagra, cialis , sildenafil, tadalafil , etc)   On the Night Before the Test: Be sure to Drink  plenty of water . Do not consume any caffeinated/decaffeinated beverages or chocolate 12 hours prior to your test. Do not take any antihistamines 12 hours prior to your test.   On the Day of the Test: Drink plenty of water  until 1 hour prior to the test. Do not eat any food 1 hour prior to test. You may take your regular medications prior to the test.  Take metoprolol  25 mg (Lopressor ) two hours prior to test. If you take Furosemide/Hydrochlorothiazide/Spironolactone/Chlorthalidone, please HOLD on the morning of the test. Patients who wear a continuous glucose monitor MUST remove the device prior to scanning. FEMALES- please wear underwire-free bra if available, avoid dresses & tight clothing      After the Test: Drink plenty of water . After receiving IV contrast, you may experience a mild flushed feeling. This is normal. On occasion, you may experience a mild rash up to 24 hours after the test. This is not dangerous. If this occurs, you can take Benadryl  25 mg, Zyrtec, Claritin, or Allegra and increase your fluid intake. (Patients taking Tikosyn should avoid Benadryl , and may take Zyrtec, Claritin, or Allegra) If you experience trouble breathing, this can be serious. If it is severe call 911 IMMEDIATELY. If it is mild, please call our office.  We will call to schedule your test 2-4 weeks out understanding that some insurance companies will need an authorization prior to the service being performed.   For more information and frequently asked questions, please visit our website : [email or web address]  For non-scheduling related questions, please contact the cardiac imaging nurse navigator should you have any questions/concerns: Cardiac Imaging Nurse Navigators Direct Office Dial: [phone]   For scheduling needs, including cancellations and rescheduling, please call [place], [phone].

## 2024-03-19 LAB — CBC
Hematocrit: 39.5 % (ref 34.0–46.6)
Hemoglobin: 13.1 g/dL (ref 11.1–15.9)
MCH: 33 pg (ref 26.6–33.0)
MCHC: 33.2 g/dL (ref 31.5–35.7)
MCV: 100 fL — ABNORMAL HIGH (ref 79–97)
Platelets: 231 10*3/uL (ref 150–450)
RBC: 3.97 x10E6/uL (ref 3.77–5.28)
RDW: 12.7 % (ref 11.7–15.4)
WBC: 5.4 10*3/uL (ref 3.4–10.8)

## 2024-03-19 LAB — BASIC METABOLIC PANEL WITH GFR
BUN/Creatinine Ratio: 18 (ref 12–28)
BUN: 14 mg/dL (ref 8–27)
CO2: 21 mmol/L (ref 20–29)
Calcium: 9.7 mg/dL (ref 8.7–10.3)
Chloride: 107 mmol/L — ABNORMAL HIGH (ref 96–106)
Creatinine, Ser: 0.77 mg/dL (ref 0.57–1.00)
Glucose: 79 mg/dL (ref 70–99)
Potassium: 5 mmol/L (ref 3.5–5.2)
Sodium: 144 mmol/L (ref 134–144)
eGFR: 79 mL/min/{1.73_m2} (ref >59)

## 2024-03-20 ENCOUNTER — Encounter: Payer: Self-pay | Admitting: *Deleted

## 2024-03-29 ENCOUNTER — Encounter (HOSPITAL_COMMUNITY): Payer: Self-pay

## 2024-04-02 ENCOUNTER — Other Ambulatory Visit: Payer: Self-pay | Admitting: Cardiology

## 2024-04-02 ENCOUNTER — Ambulatory Visit (HOSPITAL_COMMUNITY)
Admission: RE | Admit: 2024-04-02 | Discharge: 2024-04-02 | Disposition: A | Discharge status: Home / Self Care | Source: Ambulatory Visit | Attending: Cardiology | Admitting: Cardiology

## 2024-04-02 DIAGNOSIS — R079 Chest pain, unspecified: Secondary | ICD-10-CM | POA: Diagnosis present

## 2024-04-02 DIAGNOSIS — R931 Abnormal findings on diagnostic imaging of heart and coronary circulation: Secondary | ICD-10-CM | POA: Diagnosis present

## 2024-04-02 DIAGNOSIS — I2510 Atherosclerotic heart disease of native coronary artery without angina pectoris: Secondary | ICD-10-CM | POA: Insufficient documentation

## 2024-04-02 MED ORDER — IOHEXOL 350 MG/ML SOLN
75.0000 mL | Freq: Once | INTRAVENOUS | Status: AC | PRN
  Administered 2024-04-02: 75 mL via INTRAVENOUS

## 2024-04-02 MED ORDER — NITROGLYCERIN 0.4 MG SL SUBL
0.8000 mg | SUBLINGUAL_TABLET | Freq: Once | SUBLINGUAL | Status: AC
  Administered 2024-04-02: 0.8 mg via SUBLINGUAL

## 2024-04-03 ENCOUNTER — Ambulatory Visit: Payer: Self-pay | Admitting: Cardiology

## 2024-04-17 ENCOUNTER — Ambulatory Visit: Payer: Medicare Other | Admitting: Cardiology

## 2024-04-18 ENCOUNTER — Ambulatory Visit (HOSPITAL_COMMUNITY)
Admission: RE | Admit: 2024-04-18 | Discharge: 2024-04-18 | Disposition: A | Discharge status: Home / Self Care | Source: Ambulatory Visit | Attending: Cardiology | Admitting: Cardiology

## 2024-04-18 DIAGNOSIS — I340 Nonrheumatic mitral (valve) insufficiency: Secondary | ICD-10-CM | POA: Diagnosis not present

## 2024-04-18 DIAGNOSIS — I059 Rheumatic mitral valve disease, unspecified: Secondary | ICD-10-CM | POA: Insufficient documentation

## 2024-04-18 LAB — ECHOCARDIOGRAM COMPLETE
Area-P 1/2: 2.42 cm2
MV M vel: 5.62 m/s
MV Peak grad: 126.3 mmHg
P 1/2 time: 840 ms
Radius: 0.7 cm
S' Lateral: 2.5 cm

## 2024-08-29 ENCOUNTER — Other Ambulatory Visit (HOSPITAL_BASED_OUTPATIENT_CLINIC_OR_DEPARTMENT_OTHER): Payer: Self-pay

## 2024-08-29 MED ORDER — FLUZONE HIGH-DOSE 0.5 ML IM SUSY
0.5000 mL | PREFILLED_SYRINGE | Freq: Once | INTRAMUSCULAR | 0 refills | Status: AC
  Filled 2024-08-29: qty 0.5, 1d supply, fill #0

## 2024-08-29 MED ORDER — COMIRNATY 30 MCG/0.3ML IM SUSY
0.3000 mL | PREFILLED_SYRINGE | Freq: Once | INTRAMUSCULAR | 0 refills | Status: AC
  Filled 2024-08-29: qty 0.3, 1d supply, fill #0

## 2024-09-17 NOTE — Progress Notes (Unsigned)
  Cardiology Office Note:   Date:  09/18/2024  ID:  [name], DOB May 26, 1946, MRN [id number] PCP: Vernon Velna SAUNDERS, MD  [facility] Providers Cardiologist:  Lynwood Schilling, MD {  History of Present Illness:   [name] is a 78 y.o. female who I saw previously for evaluation of palpitations she had diagonal stenosis when noted on a CT that was done in [place] when she was living there.  She had a normal echo . I saw her many years ago and she was having palpitations. She does have mitral valve prolapse. However, nothing significant was noted and she didn't have any problems until she developed Graves' disease 2011. She again had palpitations. She was living in [place] and she had a workup by a cardiologist there. Coronary CT demonstrated some diagonal stenosis but she doesn't know of any other disease. She had a treadmill test which apparently was negative.   She had a normal echo in 2019.  She had a POET (Plain Old Exercise Treadmill) which was negative in 2020.  She had a PE after knee replacement.     Since I last saw her she had an echo in April with NL LV function and moderate MR.  In April when I saw her she had some chest pain and I sent her for CT which demonstrated no obstructive coronary disease.  There was mild nonobstructive disease in the mid LAD.  She returns for follow-up she has had no new cardiovascular complaints.  The patient denies any new symptoms such as chest discomfort, neck or arm discomfort. There has been no new shortness of breath, PND or orthopnea. There have been no reported palpitations, presyncope or syncope.   ROS: As stated in the HPI and negative for all other systems.  Studies Reviewed:    EKG:     NA  Risk Assessment/Calculations:         Physical Exam:   VS:  BP 120/68   Pulse (!) 44   Ht 5' 3.5 (1.613 m)   Wt 136 lb (61.7 kg)   BMI 23.71 kg/m    Wt Readings from Last 3 Encounters:  09/18/24 136 lb (61.7 kg)   03/18/24 132 lb 3.2 oz (60 kg)  03/12/24 130 lb (59 kg)     GEN: Well nourished, well developed in no acute distress NECK: No JVD; No carotid bruits CARDIAC: RRR, no murmurs, rubs, gallops RESPIRATORY:  Clear to auscultation without rales, wheezing or rhonchi  ABDOMEN: Soft, non-tender, non-distended EXTREMITIES:  No edema; No deformity   ASSESSMENT AND PLAN:   CAD She had moderate CAD but no obstructive disease on CT in May.  She will continue with aggressive risk reduction.   Mitral valve disorder There was moderate MR in May 2025.  I will repeat this in May 2026.  Dyslipidemia  I think the goal LDL should be in the 50s.  Her LDL in July was 93 although her HDL was 74.  I am going to increase her Lipitor to 40 mg daily and check a lipid profile in 3 months.   Follow up with me in 12 months.   Signed, Lynwood Schilling, MD

## 2024-09-18 ENCOUNTER — Ambulatory Visit: Attending: Cardiology | Admitting: Cardiology

## 2024-09-18 ENCOUNTER — Encounter: Payer: Self-pay | Admitting: Cardiology

## 2024-09-18 VITALS — BP 120/68 | HR 44 | Ht 63.5 in | Wt 136.0 lb

## 2024-09-18 DIAGNOSIS — I2510 Atherosclerotic heart disease of native coronary artery without angina pectoris: Secondary | ICD-10-CM

## 2024-09-18 DIAGNOSIS — I340 Nonrheumatic mitral (valve) insufficiency: Secondary | ICD-10-CM

## 2024-09-18 DIAGNOSIS — E785 Hyperlipidemia, unspecified: Secondary | ICD-10-CM | POA: Diagnosis not present

## 2024-09-18 DIAGNOSIS — I059 Rheumatic mitral valve disease, unspecified: Secondary | ICD-10-CM

## 2024-09-18 MED ORDER — ATORVASTATIN CALCIUM 40 MG PO TABS: 40.0000 mg | ORAL_TABLET | Freq: Every day | ORAL | 3 refills | Status: AC

## 2024-09-18 NOTE — Patient Instructions (Signed)
 Medication Instructions:  Increase Lipitor to 40 mg once daily *If you need a refill on your cardiac medications before your next appointment, please call your pharmacy*  Lab Work: Fasting lipid panel in 3 months at [facility] If you have labs (blood work) drawn today and your tests are completely normal, you will receive your results only by: MyChart Message (if you have MyChart) OR A paper copy in the mail If you have any lab test that is abnormal or we need to change your treatment, we will call you to review the results.  Testing/Procedures: Echocardiogram in May Your physician has requested that you have an echocardiogram. Echocardiography is a painless test that uses sound waves to create images of your heart. It provides your doctor with information about the size and shape of your heart and how well your heart's chambers and valves are working. This procedure takes approximately one hour. There are no restrictions for this procedure. Please do NOT wear cologne, perfume, aftershave, or lotions (deodorant is allowed). Please arrive 15 minutes prior to your appointment time.  Please note: We ask at that you not bring children with you during ultrasound (echo/ vascular) testing. Due to room size and safety concerns, children are not allowed in the ultrasound rooms during exams. Our front office staff cannot provide observation of children in our lobby area while testing is being conducted. An adult accompanying a patient to their appointment will only be allowed in the ultrasound room at the discretion of the ultrasound technician under special circumstances. We apologize for any inconvenience.   Follow-Up: At [facility], you and your health needs are our priority.  As part of our continuing mission to provide you with exceptional heart care, our providers are all part of one team.  This team includes your primary Cardiologist (physician) and Advanced Practice Providers or APPs  (Physician Assistants and Nurse Practitioners) who all work together to provide you with the care you need, when you need it.  Your next appointment:   1 year  Provider:   Lav[place], MD   We recommend signing up for the patient portal called MyChart.  Sign up information is provided on this After Visit Summary.  MyChart is used to connect with patients for Virtual Visits (Telemedicine).  Patients are able to view lab/test results, encounter notes, upcoming appointments, etc.  Non-urgent messages can be sent to your provider as well.   To learn more about what you can do with MyChart, go to [email or web address].

## 2024-09-23 ENCOUNTER — Encounter: Payer: Self-pay | Admitting: Radiology

## 2024-09-26 ENCOUNTER — Other Ambulatory Visit: Payer: Self-pay | Admitting: Hematology and Oncology

## 2024-09-26 DIAGNOSIS — Z1231 Encounter for screening mammogram for malignant neoplasm of breast: Secondary | ICD-10-CM

## 2024-12-05 ENCOUNTER — Other Ambulatory Visit

## 2024-12-16 ENCOUNTER — Ambulatory Visit

## 2024-12-25 ENCOUNTER — Ambulatory Visit
Admission: RE | Admit: 2024-12-25 | Discharge: 2024-12-25 | Disposition: A | Source: Ambulatory Visit | Attending: Hematology and Oncology

## 2024-12-25 DIAGNOSIS — Z1231 Encounter for screening mammogram for malignant neoplasm of breast: Secondary | ICD-10-CM

## 2025-01-13 ENCOUNTER — Inpatient Hospital Stay: Payer: Medicare Other

## 2025-01-13 ENCOUNTER — Inpatient Hospital Stay: Payer: Medicare Other | Admitting: Hematology and Oncology

## 2025-01-13 ENCOUNTER — Inpatient Hospital Stay

## 2025-03-26 ENCOUNTER — Ambulatory Visit (HOSPITAL_COMMUNITY)
